# Patient Record
Sex: Female | Born: 1935
Health system: Southern US, Community
[De-identification: ages and names within clinical notes are randomized; demographics above are authoritative.]

## PROBLEM LIST (undated history)

## (undated) DIAGNOSIS — I00 Rheumatic fever without heart involvement: Secondary | ICD-10-CM

## (undated) DIAGNOSIS — I484 Atypical atrial flutter: Secondary | ICD-10-CM

## (undated) DIAGNOSIS — I4819 Other persistent atrial fibrillation: Secondary | ICD-10-CM

## (undated) DIAGNOSIS — K219 Gastro-esophageal reflux disease without esophagitis: Secondary | ICD-10-CM

## (undated) DIAGNOSIS — T8859XA Other complications of anesthesia, initial encounter: Secondary | ICD-10-CM

## (undated) DIAGNOSIS — E871 Hypo-osmolality and hyponatremia: Secondary | ICD-10-CM

## (undated) DIAGNOSIS — C50919 Malignant neoplasm of unspecified site of unspecified female breast: Secondary | ICD-10-CM

## (undated) DIAGNOSIS — K449 Diaphragmatic hernia without obstruction or gangrene: Secondary | ICD-10-CM

## (undated) DIAGNOSIS — H409 Unspecified glaucoma: Secondary | ICD-10-CM

## (undated) DIAGNOSIS — Z9889 Other specified postprocedural states: Secondary | ICD-10-CM

## (undated) DIAGNOSIS — D696 Thrombocytopenia, unspecified: Secondary | ICD-10-CM

## (undated) DIAGNOSIS — T4145XA Adverse effect of unspecified anesthetic, initial encounter: Secondary | ICD-10-CM

## (undated) DIAGNOSIS — M1990 Unspecified osteoarthritis, unspecified site: Secondary | ICD-10-CM

## (undated) DIAGNOSIS — I5032 Chronic diastolic (congestive) heart failure: Secondary | ICD-10-CM

## (undated) DIAGNOSIS — Z923 Personal history of irradiation: Secondary | ICD-10-CM

## (undated) DIAGNOSIS — D649 Anemia, unspecified: Secondary | ICD-10-CM

## (undated) DIAGNOSIS — F419 Anxiety disorder, unspecified: Secondary | ICD-10-CM

## (undated) DIAGNOSIS — C801 Malignant (primary) neoplasm, unspecified: Secondary | ICD-10-CM

## (undated) HISTORY — DX: Other specified postprocedural states: Z98.890

## (undated) HISTORY — PX: APPENDECTOMY: SHX54

## (undated) HISTORY — DX: Atypical atrial flutter: I48.4

## (undated) HISTORY — PX: KIDNEY CYST REMOVAL: SHX684

## (undated) HISTORY — PX: BACK SURGERY: SHX140

## (undated) HISTORY — DX: Rheumatic fever without heart involvement: I00

## (undated) HISTORY — PX: ABDOMINAL HYSTERECTOMY: SHX81

## (undated) HISTORY — PX: TONSILLECTOMY: SUR1361

## (undated) HISTORY — PX: BREAST LUMPECTOMY: SHX2

## (undated) HISTORY — PX: CATARACT EXTRACTION: SUR2

## (undated) HISTORY — DX: Other persistent atrial fibrillation: I48.19

## (undated) HISTORY — DX: Chronic diastolic (congestive) heart failure: I50.32

## (undated) HISTORY — DX: Thrombocytopenia, unspecified: D69.6

## (undated) HISTORY — PX: BREAST BIOPSY: SHX20

## (undated) HISTORY — PX: CARDIAC CATHETERIZATION: SHX172

## (undated) HISTORY — PX: EYE SURGERY: SHX253

## (undated) HISTORY — DX: Hypo-osmolality and hyponatremia: E87.1

## (undated) HISTORY — PX: BREAST SURGERY: SHX581

## (undated) SURGERY — Surgical Case
Anesthesia: *Unknown

---

## 1998-07-22 ENCOUNTER — Other Ambulatory Visit: Admission: RE | Admit: 1998-07-22 | Discharge: 1998-07-22 | Payer: Self-pay | Admitting: Internal Medicine

## 1998-11-03 ENCOUNTER — Ambulatory Visit (HOSPITAL_BASED_OUTPATIENT_CLINIC_OR_DEPARTMENT_OTHER): Admission: RE | Admit: 1998-11-03 | Discharge: 1998-11-03 | Payer: Self-pay | Admitting: Orthopedic Surgery

## 1999-02-09 ENCOUNTER — Encounter: Payer: Self-pay | Admitting: Emergency Medicine

## 1999-02-09 ENCOUNTER — Emergency Department (HOSPITAL_COMMUNITY): Admission: EM | Admit: 1999-02-09 | Discharge: 1999-02-09 | Payer: Self-pay | Admitting: Emergency Medicine

## 1999-03-20 ENCOUNTER — Emergency Department (HOSPITAL_COMMUNITY): Admission: EM | Admit: 1999-03-20 | Discharge: 1999-03-20 | Payer: Self-pay | Admitting: Emergency Medicine

## 1999-03-20 ENCOUNTER — Encounter: Payer: Self-pay | Admitting: Emergency Medicine

## 2000-01-27 ENCOUNTER — Emergency Department (HOSPITAL_COMMUNITY): Admission: EM | Admit: 2000-01-27 | Discharge: 2000-01-27 | Payer: Self-pay | Admitting: Emergency Medicine

## 2000-01-27 ENCOUNTER — Encounter: Payer: Self-pay | Admitting: Emergency Medicine

## 2000-03-06 ENCOUNTER — Other Ambulatory Visit: Admission: RE | Admit: 2000-03-06 | Discharge: 2000-03-06 | Payer: Self-pay | Admitting: *Deleted

## 2000-03-28 ENCOUNTER — Encounter: Payer: Self-pay | Admitting: Internal Medicine

## 2000-03-28 ENCOUNTER — Encounter: Admission: RE | Admit: 2000-03-28 | Discharge: 2000-03-28 | Payer: Self-pay | Admitting: Internal Medicine

## 2000-10-22 ENCOUNTER — Encounter: Payer: Self-pay | Admitting: Internal Medicine

## 2000-10-22 ENCOUNTER — Encounter: Admission: RE | Admit: 2000-10-22 | Discharge: 2000-10-22 | Payer: Self-pay | Admitting: Internal Medicine

## 2001-01-29 ENCOUNTER — Encounter: Admission: RE | Admit: 2001-01-29 | Discharge: 2001-01-29 | Payer: Self-pay | Admitting: Internal Medicine

## 2001-01-29 ENCOUNTER — Encounter: Payer: Self-pay | Admitting: Internal Medicine

## 2001-02-19 ENCOUNTER — Encounter: Admission: RE | Admit: 2001-02-19 | Discharge: 2001-02-19 | Payer: Self-pay | Admitting: Internal Medicine

## 2001-02-19 ENCOUNTER — Encounter: Payer: Self-pay | Admitting: Internal Medicine

## 2001-04-01 ENCOUNTER — Encounter: Admission: RE | Admit: 2001-04-01 | Discharge: 2001-04-01 | Payer: Self-pay | Admitting: Internal Medicine

## 2001-04-01 ENCOUNTER — Encounter: Payer: Self-pay | Admitting: Internal Medicine

## 2001-04-29 ENCOUNTER — Other Ambulatory Visit: Admission: RE | Admit: 2001-04-29 | Discharge: 2001-04-29 | Payer: Self-pay | Admitting: Obstetrics and Gynecology

## 2001-05-12 ENCOUNTER — Ambulatory Visit (HOSPITAL_COMMUNITY): Admission: RE | Admit: 2001-05-12 | Discharge: 2001-05-12 | Payer: Self-pay | Admitting: Obstetrics and Gynecology

## 2001-05-12 ENCOUNTER — Encounter: Payer: Self-pay | Admitting: Obstetrics and Gynecology

## 2001-08-19 ENCOUNTER — Encounter: Payer: Self-pay | Admitting: Obstetrics and Gynecology

## 2001-08-19 ENCOUNTER — Ambulatory Visit (HOSPITAL_COMMUNITY): Admission: RE | Admit: 2001-08-19 | Discharge: 2001-08-19 | Payer: Self-pay | Admitting: Obstetrics and Gynecology

## 2001-11-11 ENCOUNTER — Encounter: Payer: Self-pay | Admitting: Obstetrics and Gynecology

## 2001-11-11 ENCOUNTER — Ambulatory Visit (HOSPITAL_COMMUNITY): Admission: RE | Admit: 2001-11-11 | Discharge: 2001-11-11 | Payer: Self-pay | Admitting: Obstetrics and Gynecology

## 2002-04-03 ENCOUNTER — Encounter: Admission: RE | Admit: 2002-04-03 | Discharge: 2002-04-03 | Payer: Self-pay | Admitting: Internal Medicine

## 2002-04-03 ENCOUNTER — Encounter: Payer: Self-pay | Admitting: Internal Medicine

## 2002-04-30 ENCOUNTER — Other Ambulatory Visit: Admission: RE | Admit: 2002-04-30 | Discharge: 2002-04-30 | Payer: Self-pay | Admitting: Obstetrics and Gynecology

## 2002-06-08 ENCOUNTER — Encounter: Payer: Self-pay | Admitting: Obstetrics and Gynecology

## 2002-06-08 ENCOUNTER — Ambulatory Visit (HOSPITAL_COMMUNITY): Admission: RE | Admit: 2002-06-08 | Discharge: 2002-06-08 | Payer: Self-pay | Admitting: Obstetrics and Gynecology

## 2003-04-07 ENCOUNTER — Encounter: Payer: Self-pay | Admitting: Internal Medicine

## 2003-04-07 ENCOUNTER — Encounter: Admission: RE | Admit: 2003-04-07 | Discharge: 2003-04-07 | Payer: Self-pay | Admitting: Internal Medicine

## 2003-05-11 ENCOUNTER — Other Ambulatory Visit: Admission: RE | Admit: 2003-05-11 | Discharge: 2003-05-11 | Payer: Self-pay | Admitting: Obstetrics and Gynecology

## 2004-04-07 ENCOUNTER — Encounter: Admission: RE | Admit: 2004-04-07 | Discharge: 2004-04-07 | Payer: Self-pay | Admitting: Internal Medicine

## 2004-08-10 ENCOUNTER — Encounter: Admission: RE | Admit: 2004-08-10 | Discharge: 2004-08-10 | Payer: Self-pay | Admitting: Internal Medicine

## 2004-08-30 ENCOUNTER — Inpatient Hospital Stay (HOSPITAL_COMMUNITY): Admission: RE | Admit: 2004-08-30 | Discharge: 2004-09-01 | Payer: Self-pay | Admitting: Neurosurgery

## 2005-04-17 ENCOUNTER — Encounter: Admission: RE | Admit: 2005-04-17 | Discharge: 2005-04-17 | Payer: Self-pay | Admitting: Internal Medicine

## 2005-05-15 ENCOUNTER — Observation Stay (HOSPITAL_COMMUNITY): Admission: EM | Admit: 2005-05-15 | Discharge: 2005-05-16 | Payer: Self-pay | Admitting: Emergency Medicine

## 2005-06-14 ENCOUNTER — Other Ambulatory Visit: Admission: RE | Admit: 2005-06-14 | Discharge: 2005-06-14 | Payer: Self-pay | Admitting: Obstetrics and Gynecology

## 2005-07-20 ENCOUNTER — Ambulatory Visit (HOSPITAL_COMMUNITY): Admission: RE | Admit: 2005-07-20 | Discharge: 2005-07-20 | Payer: Self-pay | Admitting: Cardiology

## 2006-04-24 ENCOUNTER — Encounter: Admission: RE | Admit: 2006-04-24 | Discharge: 2006-04-24 | Payer: Self-pay | Admitting: Internal Medicine

## 2006-04-26 ENCOUNTER — Ambulatory Visit (HOSPITAL_COMMUNITY): Admission: RE | Admit: 2006-04-26 | Discharge: 2006-04-26 | Payer: Self-pay | Admitting: Neurosurgery

## 2006-05-07 ENCOUNTER — Encounter: Admission: RE | Admit: 2006-05-07 | Discharge: 2006-05-07 | Payer: Self-pay | Admitting: Internal Medicine

## 2006-09-21 ENCOUNTER — Emergency Department (HOSPITAL_COMMUNITY): Admission: EM | Admit: 2006-09-21 | Discharge: 2006-09-21 | Payer: Self-pay | Admitting: Emergency Medicine

## 2006-10-23 ENCOUNTER — Encounter: Admission: RE | Admit: 2006-10-23 | Discharge: 2006-10-23 | Payer: Self-pay | Admitting: Internal Medicine

## 2006-10-24 ENCOUNTER — Encounter: Admission: RE | Admit: 2006-10-24 | Discharge: 2006-10-24 | Payer: Self-pay | Admitting: Internal Medicine

## 2006-11-18 ENCOUNTER — Ambulatory Visit (HOSPITAL_COMMUNITY): Admission: RE | Admit: 2006-11-18 | Discharge: 2006-11-18 | Payer: Self-pay | Admitting: Urology

## 2006-12-31 ENCOUNTER — Encounter: Admission: RE | Admit: 2006-12-31 | Discharge: 2006-12-31 | Payer: Self-pay | Admitting: Urology

## 2007-03-13 ENCOUNTER — Ambulatory Visit (HOSPITAL_COMMUNITY): Admission: RE | Admit: 2007-03-13 | Discharge: 2007-03-14 | Payer: Self-pay | Admitting: Interventional Radiology

## 2007-03-13 ENCOUNTER — Encounter (INDEPENDENT_AMBULATORY_CARE_PROVIDER_SITE_OTHER): Payer: Self-pay | Admitting: Interventional Radiology

## 2007-03-17 ENCOUNTER — Encounter: Admission: RE | Admit: 2007-03-17 | Discharge: 2007-03-17 | Payer: Self-pay | Admitting: Interventional Radiology

## 2007-04-16 ENCOUNTER — Encounter: Admission: RE | Admit: 2007-04-16 | Discharge: 2007-04-16 | Payer: Self-pay | Admitting: Interventional Radiology

## 2007-04-28 ENCOUNTER — Encounter: Admission: RE | Admit: 2007-04-28 | Discharge: 2007-04-28 | Payer: Self-pay | Admitting: Internal Medicine

## 2007-07-08 ENCOUNTER — Other Ambulatory Visit: Admission: RE | Admit: 2007-07-08 | Discharge: 2007-07-08 | Payer: Self-pay | Admitting: Obstetrics and Gynecology

## 2007-10-01 ENCOUNTER — Encounter: Admission: RE | Admit: 2007-10-01 | Discharge: 2007-10-01 | Payer: Self-pay | Admitting: Interventional Radiology

## 2007-12-24 ENCOUNTER — Inpatient Hospital Stay (HOSPITAL_COMMUNITY): Admission: EM | Admit: 2007-12-24 | Discharge: 2007-12-25 | Payer: Self-pay | Admitting: Emergency Medicine

## 2008-04-07 ENCOUNTER — Encounter: Admission: RE | Admit: 2008-04-07 | Discharge: 2008-04-07 | Payer: Self-pay | Admitting: Interventional Radiology

## 2008-04-09 ENCOUNTER — Ambulatory Visit: Payer: Self-pay | Admitting: Internal Medicine

## 2008-04-09 ENCOUNTER — Emergency Department (HOSPITAL_COMMUNITY): Admission: EM | Admit: 2008-04-09 | Discharge: 2008-04-10 | Payer: Self-pay | Admitting: Emergency Medicine

## 2008-05-03 ENCOUNTER — Emergency Department (HOSPITAL_COMMUNITY): Admission: EM | Admit: 2008-05-03 | Discharge: 2008-05-03 | Payer: Self-pay | Admitting: Emergency Medicine

## 2008-05-04 ENCOUNTER — Ambulatory Visit: Payer: Self-pay | Admitting: Cardiovascular Disease

## 2008-05-05 ENCOUNTER — Inpatient Hospital Stay (HOSPITAL_COMMUNITY): Admission: EM | Admit: 2008-05-05 | Discharge: 2008-05-07 | Payer: Self-pay | Admitting: Emergency Medicine

## 2008-05-27 ENCOUNTER — Ambulatory Visit (HOSPITAL_COMMUNITY): Admission: RE | Admit: 2008-05-27 | Discharge: 2008-05-27 | Payer: Self-pay | Admitting: Interventional Cardiology

## 2008-06-16 ENCOUNTER — Encounter: Admission: RE | Admit: 2008-06-16 | Discharge: 2008-06-16 | Payer: Self-pay | Admitting: Internal Medicine

## 2008-09-10 ENCOUNTER — Ambulatory Visit (HOSPITAL_COMMUNITY): Admission: RE | Admit: 2008-09-10 | Discharge: 2008-09-10 | Payer: Self-pay | Admitting: Interventional Cardiology

## 2008-10-26 ENCOUNTER — Ambulatory Visit (HOSPITAL_COMMUNITY): Admission: RE | Admit: 2008-10-26 | Discharge: 2008-10-26 | Payer: Self-pay | Admitting: Interventional Cardiology

## 2008-10-26 ENCOUNTER — Encounter (INDEPENDENT_AMBULATORY_CARE_PROVIDER_SITE_OTHER): Payer: Self-pay | Admitting: Interventional Cardiology

## 2008-11-11 ENCOUNTER — Observation Stay (HOSPITAL_COMMUNITY): Admission: AD | Admit: 2008-11-11 | Discharge: 2008-11-12 | Payer: Self-pay | Admitting: Interventional Cardiology

## 2008-11-11 ENCOUNTER — Inpatient Hospital Stay (HOSPITAL_BASED_OUTPATIENT_CLINIC_OR_DEPARTMENT_OTHER): Admission: RE | Admit: 2008-11-11 | Discharge: 2008-11-11 | Payer: Self-pay | Admitting: Interventional Cardiology

## 2008-11-17 ENCOUNTER — Ambulatory Visit: Payer: Self-pay | Admitting: Thoracic Surgery (Cardiothoracic Vascular Surgery)

## 2008-11-22 ENCOUNTER — Encounter
Admission: RE | Admit: 2008-11-22 | Discharge: 2008-11-22 | Payer: Self-pay | Admitting: Thoracic Surgery (Cardiothoracic Vascular Surgery)

## 2008-11-29 ENCOUNTER — Ambulatory Visit: Payer: Self-pay | Admitting: Thoracic Surgery (Cardiothoracic Vascular Surgery)

## 2008-12-03 ENCOUNTER — Ambulatory Visit
Admission: RE | Admit: 2008-12-03 | Discharge: 2008-12-03 | Payer: Self-pay | Admitting: Thoracic Surgery (Cardiothoracic Vascular Surgery)

## 2008-12-03 ENCOUNTER — Encounter: Payer: Self-pay | Admitting: Thoracic Surgery (Cardiothoracic Vascular Surgery)

## 2008-12-03 ENCOUNTER — Ambulatory Visit: Payer: Self-pay | Admitting: *Deleted

## 2008-12-07 HISTORY — PX: MITRAL VALVE REPAIR: SHX2039

## 2008-12-14 ENCOUNTER — Ambulatory Visit: Payer: Self-pay | Admitting: Thoracic Surgery (Cardiothoracic Vascular Surgery)

## 2008-12-14 ENCOUNTER — Inpatient Hospital Stay (HOSPITAL_COMMUNITY)
Admission: RE | Admit: 2008-12-14 | Discharge: 2008-12-20 | Payer: Self-pay | Admitting: Thoracic Surgery (Cardiothoracic Vascular Surgery)

## 2008-12-31 ENCOUNTER — Encounter
Admission: RE | Admit: 2008-12-31 | Discharge: 2008-12-31 | Payer: Self-pay | Admitting: Thoracic Surgery (Cardiothoracic Vascular Surgery)

## 2008-12-31 ENCOUNTER — Ambulatory Visit: Payer: Self-pay | Admitting: Thoracic Surgery (Cardiothoracic Vascular Surgery)

## 2009-01-03 ENCOUNTER — Ambulatory Visit: Payer: Self-pay | Admitting: Internal Medicine

## 2009-01-03 ENCOUNTER — Inpatient Hospital Stay (HOSPITAL_COMMUNITY): Admission: EM | Admit: 2009-01-03 | Discharge: 2009-01-06 | Payer: Self-pay | Admitting: Emergency Medicine

## 2009-02-07 ENCOUNTER — Ambulatory Visit: Payer: Self-pay | Admitting: Thoracic Surgery (Cardiothoracic Vascular Surgery)

## 2009-02-07 ENCOUNTER — Encounter: Payer: Self-pay | Admitting: Internal Medicine

## 2009-02-24 ENCOUNTER — Encounter (HOSPITAL_COMMUNITY): Admission: RE | Admit: 2009-02-24 | Discharge: 2009-05-25 | Payer: Self-pay | Admitting: Interventional Cardiology

## 2009-04-26 ENCOUNTER — Encounter: Admission: RE | Admit: 2009-04-26 | Discharge: 2009-04-26 | Payer: Self-pay | Admitting: Interventional Radiology

## 2009-04-26 ENCOUNTER — Ambulatory Visit (HOSPITAL_COMMUNITY): Admission: RE | Admit: 2009-04-26 | Discharge: 2009-04-26 | Payer: Self-pay | Admitting: Interventional Radiology

## 2009-07-04 ENCOUNTER — Encounter: Payer: Self-pay | Admitting: Internal Medicine

## 2009-07-04 ENCOUNTER — Ambulatory Visit: Payer: Self-pay | Admitting: Thoracic Surgery (Cardiothoracic Vascular Surgery)

## 2009-08-04 ENCOUNTER — Encounter: Admission: RE | Admit: 2009-08-04 | Discharge: 2009-08-04 | Payer: Self-pay | Admitting: Internal Medicine

## 2009-08-29 ENCOUNTER — Other Ambulatory Visit: Admission: RE | Admit: 2009-08-29 | Discharge: 2009-08-29 | Payer: Self-pay | Admitting: Obstetrics and Gynecology

## 2009-09-01 ENCOUNTER — Encounter: Admission: RE | Admit: 2009-09-01 | Discharge: 2009-09-01 | Payer: Self-pay | Admitting: Obstetrics and Gynecology

## 2009-12-12 ENCOUNTER — Encounter: Payer: Self-pay | Admitting: Internal Medicine

## 2009-12-12 ENCOUNTER — Ambulatory Visit: Payer: Self-pay | Admitting: Thoracic Surgery (Cardiothoracic Vascular Surgery)

## 2010-01-19 ENCOUNTER — Ambulatory Visit (HOSPITAL_COMMUNITY): Admission: RE | Admit: 2010-01-19 | Discharge: 2010-01-19 | Payer: Self-pay | Admitting: Gastroenterology

## 2010-05-08 ENCOUNTER — Ambulatory Visit (HOSPITAL_COMMUNITY): Admission: RE | Admit: 2010-05-08 | Discharge: 2010-05-08 | Payer: Self-pay | Admitting: Interventional Radiology

## 2010-05-17 ENCOUNTER — Encounter: Admission: RE | Admit: 2010-05-17 | Discharge: 2010-05-17 | Payer: Self-pay | Admitting: Interventional Radiology

## 2010-07-29 ENCOUNTER — Encounter: Payer: Self-pay | Admitting: Internal Medicine

## 2010-07-30 ENCOUNTER — Encounter: Payer: Self-pay | Admitting: Interventional Radiology

## 2010-07-30 ENCOUNTER — Encounter: Payer: Self-pay | Admitting: Internal Medicine

## 2010-07-31 ENCOUNTER — Encounter: Payer: Self-pay | Admitting: Thoracic Surgery (Cardiothoracic Vascular Surgery)

## 2010-08-07 ENCOUNTER — Encounter
Admission: RE | Admit: 2010-08-07 | Discharge: 2010-08-07 | Payer: Self-pay | Source: Home / Self Care | Attending: Internal Medicine | Admitting: Internal Medicine

## 2010-08-08 NOTE — Letter (Signed)
Summary: [facility]  [facility]   Imported By: Roderic Ovens 02/21/2009 11:28:50  _____________________________________________________________________  External Attachment:    Type:   Image     Comment:   External Document

## 2010-08-08 NOTE — Letter (Signed)
Summary: [facility]  [facility]   Imported By: Roderic Ovens 07/07/2009 16:17:43  _____________________________________________________________________  External Attachment:    Type:   Image     Comment:   External Document

## 2010-08-08 NOTE — Letter (Signed)
Summary: [facility]  [facility]   Imported By: Debby Freiberg 01/10/2010 13:32:29  _____________________________________________________________________  External Attachment:    Type:   Image     Comment:   External Document

## 2010-10-15 LAB — PROTIME-INR
INR: 2.2 — ABNORMAL HIGH (ref 0.00–1.49)
Prothrombin Time: 25.7 seconds — ABNORMAL HIGH (ref 11.6–15.2)

## 2010-10-16 LAB — TYPE AND SCREEN
ABO/RH(D): A POS
Antibody Screen: NEGATIVE

## 2010-10-16 LAB — POCT I-STAT 3, ART BLOOD GAS (G3+)
Acid-Base Excess: 1 mmol/L (ref 0.0–2.0)
Acid-Base Excess: 3 mmol/L — ABNORMAL HIGH (ref 0.0–2.0)
Bicarbonate: 25 mEq/L — ABNORMAL HIGH (ref 20.0–24.0)
Bicarbonate: 26.1 mEq/L — ABNORMAL HIGH (ref 20.0–24.0)
O2 Saturation: 94 %
O2 Saturation: 98 %
Patient temperature: 37
Patient temperature: 37.2
TCO2: 26 mmol/L (ref 0–100)
TCO2: 27 mmol/L (ref 0–100)
pCO2 arterial: 32.2 mmHg — ABNORMAL LOW (ref 35.0–45.0)
pCO2 arterial: 35.9 mmHg (ref 35.0–45.0)
pH, Arterial: 7.453 — ABNORMAL HIGH (ref 7.350–7.400)
pH, Arterial: 7.517 — ABNORMAL HIGH (ref 7.350–7.400)
pO2, Arterial: 63 mmHg — ABNORMAL LOW (ref 80.0–100.0)
pO2, Arterial: 98 mmHg (ref 80.0–100.0)

## 2010-10-16 LAB — CBC
HCT: 34.2 % — ABNORMAL LOW (ref 36.0–46.0)
HCT: 28.6 % — ABNORMAL LOW (ref 36.0–46.0)
HCT: 29.1 % — ABNORMAL LOW (ref 36.0–46.0)
HCT: 31.6 % — ABNORMAL LOW (ref 36.0–46.0)
HCT: 31.8 % — ABNORMAL LOW (ref 36.0–46.0)
HCT: 32.9 % — ABNORMAL LOW (ref 36.0–46.0)
HCT: 33 % — ABNORMAL LOW (ref 36.0–46.0)
HCT: 36.2 % (ref 36.0–46.0)
Hemoglobin: 11.7 g/dL — ABNORMAL LOW (ref 12.0–15.0)
Hemoglobin: 10.1 g/dL — ABNORMAL LOW (ref 12.0–15.0)
Hemoglobin: 10.2 g/dL — ABNORMAL LOW (ref 12.0–15.0)
Hemoglobin: 10.9 g/dL — ABNORMAL LOW (ref 12.0–15.0)
Hemoglobin: 11 g/dL — ABNORMAL LOW (ref 12.0–15.0)
Hemoglobin: 11.3 g/dL — ABNORMAL LOW (ref 12.0–15.0)
Hemoglobin: 11.5 g/dL — ABNORMAL LOW (ref 12.0–15.0)
Hemoglobin: 12.6 g/dL (ref 12.0–15.0)
MCHC: 34.3 g/dL (ref 30.0–36.0)
MCHC: 34.3 g/dL (ref 30.0–36.0)
MCHC: 34.5 g/dL (ref 30.0–36.0)
MCHC: 34.7 g/dL (ref 30.0–36.0)
MCHC: 34.7 g/dL (ref 30.0–36.0)
MCHC: 35 g/dL (ref 30.0–36.0)
MCHC: 35.1 g/dL (ref 30.0–36.0)
MCHC: 35.4 g/dL (ref 30.0–36.0)
MCV: 105 fL — ABNORMAL HIGH (ref 78.0–100.0)
MCV: 100.2 fL — ABNORMAL HIGH (ref 78.0–100.0)
MCV: 100.2 fL — ABNORMAL HIGH (ref 78.0–100.0)
MCV: 101.2 fL — ABNORMAL HIGH (ref 78.0–100.0)
MCV: 101.6 fL — ABNORMAL HIGH (ref 78.0–100.0)
MCV: 101.9 fL — ABNORMAL HIGH (ref 78.0–100.0)
MCV: 102.7 fL — ABNORMAL HIGH (ref 78.0–100.0)
MCV: 103.9 fL — ABNORMAL HIGH (ref 78.0–100.0)
Platelets: 242 10*3/uL (ref 150–400)
Platelets: 102 10*3/uL — ABNORMAL LOW (ref 150–400)
Platelets: 118 10*3/uL — ABNORMAL LOW (ref 150–400)
Platelets: 119 10*3/uL — ABNORMAL LOW (ref 150–400)
Platelets: 119 10*3/uL — ABNORMAL LOW (ref 150–400)
Platelets: 131 10*3/uL — ABNORMAL LOW (ref 150–400)
Platelets: 144 10*3/uL — ABNORMAL LOW (ref 150–400)
Platelets: 92 10*3/uL — ABNORMAL LOW (ref 150–400)
RBC: 3.26 MIL/uL — ABNORMAL LOW (ref 3.87–5.11)
RBC: 2.75 MIL/uL — ABNORMAL LOW (ref 3.87–5.11)
RBC: 2.85 MIL/uL — ABNORMAL LOW (ref 3.87–5.11)
RBC: 3.12 MIL/uL — ABNORMAL LOW (ref 3.87–5.11)
RBC: 3.16 MIL/uL — ABNORMAL LOW (ref 3.87–5.11)
RBC: 3.22 MIL/uL — ABNORMAL LOW (ref 3.87–5.11)
RBC: 3.29 MIL/uL — ABNORMAL LOW (ref 3.87–5.11)
RBC: 3.57 MIL/uL — ABNORMAL LOW (ref 3.87–5.11)
RDW: 17.1 % — ABNORMAL HIGH (ref 11.5–15.5)
RDW: 13.6 % (ref 11.5–15.5)
RDW: 14.1 % (ref 11.5–15.5)
RDW: 14.8 % (ref 11.5–15.5)
RDW: 14.9 % (ref 11.5–15.5)
RDW: 14.9 % (ref 11.5–15.5)
RDW: 14.9 % (ref 11.5–15.5)
RDW: 14.9 % (ref 11.5–15.5)
WBC: 5.4 10*3/uL (ref 4.0–10.5)
WBC: 11 10*3/uL — ABNORMAL HIGH (ref 4.0–10.5)
WBC: 11.2 10*3/uL — ABNORMAL HIGH (ref 4.0–10.5)
WBC: 15 10*3/uL — ABNORMAL HIGH (ref 4.0–10.5)
WBC: 15.1 10*3/uL — ABNORMAL HIGH (ref 4.0–10.5)
WBC: 15.7 10*3/uL — ABNORMAL HIGH (ref 4.0–10.5)
WBC: 6.8 10*3/uL (ref 4.0–10.5)
WBC: 9.6 10*3/uL (ref 4.0–10.5)

## 2010-10-16 LAB — BLOOD GAS, ARTERIAL
Acid-Base Excess: 4.8 mmol/L — ABNORMAL HIGH (ref 0.0–2.0)
Bicarbonate: 28.7 mEq/L — ABNORMAL HIGH (ref 20.0–24.0)
Drawn by: 181601
O2 Content: 21 L/min
O2 Saturation: 97.6 %
Patient temperature: 98.6
TCO2: 30 mmol/L (ref 0–100)
pCO2 arterial: 41.7 mmHg (ref 35.0–45.0)
pH, Arterial: 7.452 — ABNORMAL HIGH (ref 7.350–7.400)
pO2, Arterial: 83.1 mmHg (ref 80.0–100.0)

## 2010-10-16 LAB — DIFFERENTIAL
Basophils Absolute: 0 10*3/uL (ref 0.0–0.1)
Basophils Relative: 0 % (ref 0–1)
Eosinophils Absolute: 0.1 10*3/uL (ref 0.0–0.7)
Eosinophils Relative: 2 % (ref 0–5)
Lymphocytes Relative: 13 % (ref 12–46)
Lymphs Abs: 0.7 10*3/uL (ref 0.7–4.0)
Monocytes Absolute: 0.5 10*3/uL (ref 0.1–1.0)
Monocytes Relative: 10 % (ref 3–12)
Neutro Abs: 4 10*3/uL (ref 1.7–7.7)
Neutrophils Relative %: 75 % (ref 43–77)

## 2010-10-16 LAB — BASIC METABOLIC PANEL
BUN: 11 mg/dL (ref 6–23)
BUN: 10 mg/dL (ref 6–23)
BUN: 10 mg/dL (ref 6–23)
BUN: 13 mg/dL (ref 6–23)
BUN: 7 mg/dL (ref 6–23)
BUN: 7 mg/dL (ref 6–23)
CO2: 27 mEq/L (ref 19–32)
CO2: 26 mEq/L (ref 19–32)
CO2: 30 mEq/L (ref 19–32)
CO2: 31 mEq/L (ref 19–32)
CO2: 31 mEq/L (ref 19–32)
CO2: 31 mEq/L (ref 19–32)
Calcium: 9.4 mg/dL (ref 8.4–10.5)
Calcium: 7.9 mg/dL — ABNORMAL LOW (ref 8.4–10.5)
Calcium: 8.2 mg/dL — ABNORMAL LOW (ref 8.4–10.5)
Calcium: 8.6 mg/dL (ref 8.4–10.5)
Calcium: 8.8 mg/dL (ref 8.4–10.5)
Calcium: 8.8 mg/dL (ref 8.4–10.5)
Chloride: 101 mEq/L (ref 96–112)
Chloride: 108 mEq/L (ref 96–112)
Chloride: 95 mEq/L — ABNORMAL LOW (ref 96–112)
Chloride: 98 mEq/L (ref 96–112)
Chloride: 99 mEq/L (ref 96–112)
Chloride: 99 mEq/L (ref 96–112)
Creatinine, Ser: 0.53 mg/dL (ref 0.4–1.2)
Creatinine, Ser: 0.48 mg/dL (ref 0.4–1.2)
Creatinine, Ser: 0.59 mg/dL (ref 0.4–1.2)
Creatinine, Ser: 0.6 mg/dL (ref 0.4–1.2)
Creatinine, Ser: 0.6 mg/dL (ref 0.4–1.2)
Creatinine, Ser: 0.62 mg/dL (ref 0.4–1.2)
GFR calc Af Amer: >60 mL/min (ref >60)
GFR calc Af Amer: >60 mL/min (ref >60)
GFR calc Af Amer: >60 mL/min (ref >60)
GFR calc Af Amer: >60 mL/min (ref >60)
GFR calc Af Amer: >60 mL/min (ref >60)
GFR calc Af Amer: >60 mL/min (ref >60)
GFR calc non Af Amer: >60 mL/min (ref >60)
GFR calc non Af Amer: >60 mL/min (ref >60)
GFR calc non Af Amer: >60 mL/min (ref >60)
GFR calc non Af Amer: >60 mL/min (ref >60)
GFR calc non Af Amer: >60 mL/min (ref >60)
GFR calc non Af Amer: >60 mL/min (ref >60)
Glucose, Bld: 96 mg/dL (ref 70–99)
Glucose, Bld: 111 mg/dL — ABNORMAL HIGH (ref 70–99)
Glucose, Bld: 119 mg/dL — ABNORMAL HIGH (ref 70–99)
Glucose, Bld: 159 mg/dL — ABNORMAL HIGH (ref 70–99)
Glucose, Bld: 169 mg/dL — ABNORMAL HIGH (ref 70–99)
Glucose, Bld: 94 mg/dL (ref 70–99)
Potassium: 4.9 mEq/L (ref 3.5–5.1)
Potassium: 3.2 mEq/L — ABNORMAL LOW (ref 3.5–5.1)
Potassium: 3.5 mEq/L (ref 3.5–5.1)
Potassium: 3.5 mEq/L (ref 3.5–5.1)
Potassium: 3.8 mEq/L (ref 3.5–5.1)
Potassium: 4.2 mEq/L (ref 3.5–5.1)
Sodium: 133 mEq/L — ABNORMAL LOW (ref 135–145)
Sodium: 135 mEq/L (ref 135–145)
Sodium: 135 mEq/L (ref 135–145)
Sodium: 137 mEq/L (ref 135–145)
Sodium: 138 mEq/L (ref 135–145)
Sodium: 141 mEq/L (ref 135–145)

## 2010-10-16 LAB — HEMOGLOBIN AND HEMATOCRIT, BLOOD
HCT: 24.5 % — ABNORMAL LOW (ref 36.0–46.0)
Hemoglobin: 8.4 g/dL — ABNORMAL LOW (ref 12.0–15.0)

## 2010-10-16 LAB — POCT I-STAT, CHEM 8
BUN: 6 mg/dL (ref 6–23)
BUN: 7 mg/dL (ref 6–23)
Calcium, Ion: 1.07 mmol/L — ABNORMAL LOW (ref 1.12–1.32)
Calcium, Ion: 1.13 mmol/L (ref 1.12–1.32)
Chloride: 105 mEq/L (ref 96–112)
Chloride: 98 mEq/L (ref 96–112)
Creatinine, Ser: 0.5 mg/dL (ref 0.4–1.2)
Creatinine, Ser: 0.6 mg/dL (ref 0.4–1.2)
Glucose, Bld: 150 mg/dL — ABNORMAL HIGH (ref 70–99)
Glucose, Bld: 185 mg/dL — ABNORMAL HIGH (ref 70–99)
HCT: 32 % — ABNORMAL LOW (ref 36.0–46.0)
HCT: 35 % — ABNORMAL LOW (ref 36.0–46.0)
Hemoglobin: 10.9 g/dL — ABNORMAL LOW (ref 12.0–15.0)
Hemoglobin: 11.9 g/dL — ABNORMAL LOW (ref 12.0–15.0)
Potassium: 3.6 mEq/L (ref 3.5–5.1)
Potassium: 3.8 mEq/L (ref 3.5–5.1)
Sodium: 137 mEq/L (ref 135–145)
Sodium: 141 mEq/L (ref 135–145)
TCO2: 25 mmol/L (ref 0–100)
TCO2: 27 mmol/L (ref 0–100)

## 2010-10-16 LAB — PROTIME-INR
INR: 3 — ABNORMAL HIGH (ref 0.00–1.49)
INR: 3.1 — ABNORMAL HIGH (ref 0.00–1.49)
INR: 3.4 — ABNORMAL HIGH (ref 0.00–1.49)
INR: 1 (ref 0.00–1.49)
INR: 1.1 (ref 0.00–1.49)
INR: 1.1 (ref 0.00–1.49)
INR: 1.2 (ref 0.00–1.49)
INR: 1.2 (ref 0.00–1.49)
INR: 1.3 (ref 0.00–1.49)
Prothrombin Time: 33.6 seconds — ABNORMAL HIGH (ref 11.6–15.2)
Prothrombin Time: 34.4 seconds — ABNORMAL HIGH (ref 11.6–15.2)
Prothrombin Time: 37.1 seconds — ABNORMAL HIGH (ref 11.6–15.2)
Prothrombin Time: 13.1 seconds (ref 11.6–15.2)
Prothrombin Time: 14.3 seconds (ref 11.6–15.2)
Prothrombin Time: 14.4 seconds (ref 11.6–15.2)
Prothrombin Time: 15.4 seconds — ABNORMAL HIGH (ref 11.6–15.2)
Prothrombin Time: 15.7 seconds — ABNORMAL HIGH (ref 11.6–15.2)
Prothrombin Time: 17 seconds — ABNORMAL HIGH (ref 11.6–15.2)

## 2010-10-16 LAB — COMPREHENSIVE METABOLIC PANEL
ALT: 17 U/L (ref 0–35)
AST: 11 U/L (ref 0–37)
Albumin: 3.5 g/dL (ref 3.5–5.2)
Alkaline Phosphatase: 50 U/L (ref 39–117)
BUN: 12 mg/dL (ref 6–23)
CO2: 26 mEq/L (ref 19–32)
Calcium: 9 mg/dL (ref 8.4–10.5)
Chloride: 103 mEq/L (ref 96–112)
Creatinine, Ser: 0.45 mg/dL (ref 0.4–1.2)
GFR calc Af Amer: >60 mL/min (ref >60)
GFR calc non Af Amer: >60 mL/min (ref >60)
Glucose, Bld: 101 mg/dL — ABNORMAL HIGH (ref 70–99)
Potassium: 3.4 mEq/L — ABNORMAL LOW (ref 3.5–5.1)
Sodium: 140 mEq/L (ref 135–145)
Total Bilirubin: 0.7 mg/dL (ref 0.3–1.2)
Total Protein: 6.9 g/dL (ref 6.0–8.3)

## 2010-10-16 LAB — URINALYSIS, ROUTINE W REFLEX MICROSCOPIC
Bilirubin Urine: NEGATIVE
Glucose, UA: NEGATIVE mg/dL
Hgb urine dipstick: NEGATIVE
Ketones, ur: NEGATIVE mg/dL
Nitrite: NEGATIVE
Protein, ur: NEGATIVE mg/dL
Specific Gravity, Urine: 1.015 (ref 1.005–1.030)
Urobilinogen, UA: 1 mg/dL (ref 0.0–1.0)
pH: 6.5 (ref 5.0–8.0)

## 2010-10-16 LAB — CARDIAC PANEL(CRET KIN+CKTOT+MB+TROPI)
CK, MB: 2.7 ng/mL (ref 0.3–4.0)
CK, MB: 2.8 ng/mL (ref 0.3–4.0)
Relative Index: INVALID (ref 0.0–2.5)
Relative Index: INVALID (ref 0.0–2.5)
Total CK: 34 U/L (ref 7–177)
Total CK: 36 U/L (ref 7–177)
Troponin I: 0.03 ng/mL (ref 0.00–0.06)
Troponin I: 0.03 ng/mL (ref 0.00–0.06)

## 2010-10-16 LAB — GLUCOSE, CAPILLARY
Glucose-Capillary: 125 mg/dL — ABNORMAL HIGH (ref 70–99)
Glucose-Capillary: 129 mg/dL — ABNORMAL HIGH (ref 70–99)
Glucose-Capillary: 135 mg/dL — ABNORMAL HIGH (ref 70–99)
Glucose-Capillary: 151 mg/dL — ABNORMAL HIGH (ref 70–99)
Glucose-Capillary: 165 mg/dL — ABNORMAL HIGH (ref 70–99)
Glucose-Capillary: 176 mg/dL — ABNORMAL HIGH (ref 70–99)
Glucose-Capillary: 79 mg/dL (ref 70–99)

## 2010-10-16 LAB — CREATININE, SERUM
Creatinine, Ser: 0.48 mg/dL (ref 0.4–1.2)
Creatinine, Ser: 0.58 mg/dL (ref 0.4–1.2)
GFR calc Af Amer: >60 mL/min (ref >60)
GFR calc Af Amer: >60 mL/min (ref >60)
GFR calc non Af Amer: >60 mL/min (ref >60)
GFR calc non Af Amer: >60 mL/min (ref >60)

## 2010-10-16 LAB — POCT I-STAT 4, (NA,K, GLUC, HGB,HCT)
Glucose, Bld: 87 mg/dL (ref 70–99)
HCT: 30 % — ABNORMAL LOW (ref 36.0–46.0)
Hemoglobin: 10.2 g/dL — ABNORMAL LOW (ref 12.0–15.0)
Potassium: 2.7 mEq/L — CL (ref 3.5–5.1)
Sodium: 145 mEq/L (ref 135–145)

## 2010-10-16 LAB — POCT CARDIAC MARKERS
CKMB, poc: 2.1 ng/mL (ref 1.0–8.0)
Myoglobin, poc: 54.5 ng/mL (ref 12–200)
Troponin i, poc: <0.05 ng/mL (ref 0.00–0.09)

## 2010-10-16 LAB — PREPARE FRESH FROZEN PLASMA

## 2010-10-16 LAB — PREPARE PLATELETS

## 2010-10-16 LAB — APTT
aPTT: 27 seconds (ref 24–37)
aPTT: 36 seconds (ref 24–37)

## 2010-10-16 LAB — MAGNESIUM
Magnesium: 2.3 mg/dL (ref 1.5–2.5)
Magnesium: 2.1 mg/dL (ref 1.5–2.5)
Magnesium: 2.3 mg/dL (ref 1.5–2.5)
Magnesium: 2.7 mg/dL — ABNORMAL HIGH (ref 1.5–2.5)

## 2010-10-16 LAB — HEMOGLOBIN A1C
Hgb A1c MFr Bld: 5.2 % (ref 4.6–6.1)
Mean Plasma Glucose: 103 mg/dL

## 2010-10-16 LAB — CK TOTAL AND CKMB (NOT AT ARMC)
CK, MB: 2.9 ng/mL (ref 0.3–4.0)
Relative Index: INVALID (ref 0.0–2.5)
Total CK: 44 U/L (ref 7–177)

## 2010-10-16 LAB — TROPONIN I: Troponin I: 0.03 ng/mL (ref 0.00–0.06)

## 2010-10-16 LAB — TSH: TSH: 0.856 u[IU]/mL (ref 0.350–4.500)

## 2010-10-16 LAB — PLATELET COUNT: Platelets: 82 10*3/uL — ABNORMAL LOW (ref 150–400)

## 2010-10-17 LAB — BLOOD GAS, ARTERIAL
Acid-Base Excess: 5.7 mmol/L — ABNORMAL HIGH (ref 0.0–2.0)
Bicarbonate: 29.4 mEq/L — ABNORMAL HIGH (ref 20.0–24.0)
Drawn by: 181601
FIO2: 0.21 %
O2 Saturation: 98.5 %
Patient temperature: 98.6
TCO2: 30.6 mmol/L (ref 0–100)
pCO2 arterial: 40.1 mmHg (ref 35.0–45.0)
pH, Arterial: 7.478 — ABNORMAL HIGH (ref 7.350–7.400)
pO2, Arterial: 92.9 mmHg (ref 80.0–100.0)

## 2010-10-17 LAB — CBC
HCT: 38.9 % (ref 36.0–46.0)
Hemoglobin: 13.4 g/dL (ref 12.0–15.0)
MCHC: 34.5 g/dL (ref 30.0–36.0)
MCV: 101.6 fL — ABNORMAL HIGH (ref 78.0–100.0)
Platelets: 165 10*3/uL (ref 150–400)
RBC: 3.83 MIL/uL — ABNORMAL LOW (ref 3.87–5.11)
RDW: 13.7 % (ref 11.5–15.5)
WBC: 7.7 10*3/uL (ref 4.0–10.5)

## 2010-10-17 LAB — URINALYSIS, ROUTINE W REFLEX MICROSCOPIC
Bilirubin Urine: NEGATIVE
Glucose, UA: NEGATIVE mg/dL
Hgb urine dipstick: NEGATIVE
Ketones, ur: NEGATIVE mg/dL
Nitrite: NEGATIVE
Protein, ur: NEGATIVE mg/dL
Specific Gravity, Urine: 1.015 (ref 1.005–1.030)
Urobilinogen, UA: 1 mg/dL (ref 0.0–1.0)
pH: 6.5 (ref 5.0–8.0)

## 2010-10-17 LAB — COMPREHENSIVE METABOLIC PANEL
ALT: 18 U/L (ref 0–35)
AST: 20 U/L (ref 0–37)
Albumin: 3.8 g/dL (ref 3.5–5.2)
Alkaline Phosphatase: 56 U/L (ref 39–117)
BUN: 14 mg/dL (ref 6–23)
CO2: 26 mEq/L (ref 19–32)
Calcium: 9.2 mg/dL (ref 8.4–10.5)
Chloride: 101 mEq/L (ref 96–112)
Creatinine, Ser: 0.49 mg/dL (ref 0.4–1.2)
GFR calc Af Amer: >60 mL/min (ref >60)
GFR calc non Af Amer: >60 mL/min (ref >60)
Glucose, Bld: 107 mg/dL — ABNORMAL HIGH (ref 70–99)
Potassium: 4 mEq/L (ref 3.5–5.1)
Sodium: 136 mEq/L (ref 135–145)
Total Bilirubin: 1 mg/dL (ref 0.3–1.2)
Total Protein: 6.9 g/dL (ref 6.0–8.3)

## 2010-10-17 LAB — POCT I-STAT 3, VENOUS BLOOD GAS (G3P V)
Acid-Base Excess: 3 mmol/L — ABNORMAL HIGH (ref 0.0–2.0)
Bicarbonate: 27.8 mEq/L — ABNORMAL HIGH (ref 20.0–24.0)
O2 Saturation: 73 %
TCO2: 29 mmol/L (ref 0–100)
pCO2, Ven: 42.4 mmHg — ABNORMAL LOW (ref 45.0–50.0)
pH, Ven: 7.425 — ABNORMAL HIGH (ref 7.250–7.300)
pO2, Ven: 38 mmHg (ref 30.0–45.0)

## 2010-10-17 LAB — POCT I-STAT 3, ART BLOOD GAS (G3+)
Acid-Base Excess: 4 mmol/L — ABNORMAL HIGH (ref 0.0–2.0)
Bicarbonate: 28.2 mEq/L — ABNORMAL HIGH (ref 20.0–24.0)
O2 Saturation: 95 %
TCO2: 29 mmol/L (ref 0–100)
pCO2 arterial: 39.9 mmHg (ref 35.0–45.0)
pH, Arterial: 7.457 — ABNORMAL HIGH (ref 7.350–7.400)
pO2, Arterial: 72 mmHg — ABNORMAL LOW (ref 80.0–100.0)

## 2010-10-17 LAB — TYPE AND SCREEN
ABO/RH(D): A POS
Antibody Screen: NEGATIVE

## 2010-10-17 LAB — HEMOGLOBIN A1C
Hgb A1c MFr Bld: 5.1 % (ref 4.6–6.1)
Mean Plasma Glucose: 100 mg/dL

## 2010-10-17 LAB — APTT: aPTT: 27 seconds (ref 24–37)

## 2010-10-17 LAB — PROTIME-INR
INR: 1.1 (ref 0.00–1.49)
INR: 1.1 (ref 0.00–1.49)
INR: 1.1 (ref 0.00–1.49)
Prothrombin Time: 14.9 seconds (ref 11.6–15.2)
Prothrombin Time: 14 seconds (ref 11.6–15.2)
Prothrombin Time: 14.1 seconds (ref 11.6–15.2)

## 2010-10-17 LAB — ABO/RH: ABO/RH(D): A POS

## 2010-11-21 NOTE — Discharge Summary (Signed)
[name], [name]                 ACCOUNT NO.:  [id number]   MEDICAL RECORD NO.:  [id number]          PATIENT TYPE:  EMS   LOCATION:  ED                           FACILITY:  [facility]   PHYSICIAN:  Hal T. Stoneking, M.D. DATE OF BIRTH:  Aug 12, 1925   DATE OF ADMISSION:  04/09/2008  DATE OF DISCHARGE:  04/10/2008                               DISCHARGE SUMMARY   ADMITTING DIAGNOSIS:  Left arm and hand pain.   DISCHARGE DIAGNOSES:  1. Left arm and hand pain question, cardiac versus musculoskeletal.  2. Past history of mitral valve prolapse  3. History of supraventricular tachycardia.  4. Hypercholesterolemia   BRIEF HISTORY:  The patient is a very nice 77 year old white female who  is independent.  Stated that for the past week or so, she just was not  feeling well.  She states that she would wake up feeling kind of anxious  but no chest pain.  Then last evening at rest, she developed pain in her  left hand and her left arm.  She finally decided to come to the  emergency room to have this evaluated.  She states the pain gradually  resolved.  She was given nitroglycerin paste but this gave her a severe  headache.  It was discontinued but she had no return of the pain.   PHYSICAL EXAMINATION:  VITAL SIGNS:  Temperature at the time of  discharge 98, blood pressure 102/60, pulse rate 65, respiratory rate 16,  O2 sat 100%.  HEART:  Regular rate and rhythm with a 3/6 systolic murmur heard best at  the apex.  LUNGS:  Clear.   LABORATORY DATA:  White count 5200, hemoglobin 13, and platelet count  182,000.  Initial CK 60 with an MB of 1.6.  Initial troponin less than  0.05.  Repeat troponin after 8 hours 0.04, CK 66 with MB of 1.8.  Electrolytes, sodium 139, potassium 3.5, chloride 103, bicarb 27,  glucose 128, BUN 8, creatinine 0.53, albumin 3.9, SGOT 23, and SGPT 19.  Chest x-ray showed findings consistent with COPD.  CT angiogram done  because of an elevated D-dimer revealed no evidence  of pulmonary  embolus.  The D-dimer was slightly elevated at 0.51.   ASSESSMENT:  Left arm pain of questionable significance.  Her workup is  negative.  Her EKG was normal.   DISPOSITION:  The patient is discharged home on her routine medications,  which include atenolol and her Ativan p.r.n.  She will follow up in the  office in approximately 2-3 days to evaluate further, possibly set up to  the echocardiogram to reevaluate her heart murmur and to consider a  stress Cardiolite.           ______________________________  Sunday Spillers. Pete Glatter, M.D.     HTS/MEDQ  D:  04/10/2008  T:  04/10/2008  Job:  [id number]   cc:   Theressa Millard, M.D.  Fax: [phone]

## 2010-11-21 NOTE — H&P (Signed)
NAMEKELLER, MIKELS NO.:  [id number]   MEDICAL RECORD NO.:  [id number]          PATIENT TYPE:  INP   LOCATION:  1414                         FACILITY:  [facility]   PHYSICIAN:  Ramiro Harvest, MD    DATE OF BIRTH:  [birth year]   DATE OF ADMISSION:  12/24/2007  DATE OF DISCHARGE:                              HISTORY & PHYSICAL   PRIMARY CARE PHYSICIAN:  The patient's primary care physician is Theressa Millard, M.D. of [place].   GASTROENTEROLOGIST:  The patient's gastroenterologist is Tasia Catchings,  M.D. of [place].   HISTORY OF THE PRESENT ILLNESS:  This patient is a pleasant 77 year old  white female with a history of mitral valve prolapse, supraventricular  tachycardia, lumbar disease status post surgery, and right breast cancer  status post lumpectomy who presented to the ED with a several-hour  history of grabbing constant lower abdominal pain starting at 16:30  hours on the day of admission.  He had several episodes of loose stools  at home and in the ED. Her associated symptoms include nausea and  chills.  No nausea and chills.  The patient denies any fever; and, has  had no emesis, no chest pain, no shortness of breath, no palpitations no  dysuria, no hematuria, no melena, no hematochezia, no hematemesis, and  no focal neurological symptoms.  The patient states that 5 days prior to  admission he was treated with Macrobid for a urinary tract infection.  The patient also states that recently he was started on amoxicillin for  an oral infection sustained after oral surgery for cyst removal in his  salivary gland.  The patient has no other associated symptoms.   The patient came to the ED where labs obtained showed a white count of  11.0, H&H of 14.6 and 42.6 cm respectively.  CMET showed a sodium of  134, potassium 4.1, chloride 98, bicarb 24, BUN 17, and creatinine 0.62.  Bilirubin of 1.3.  UA was positive for nitrites and positive for  leukocytes.  Micro showed no white cells.  CT of the abdomen and pelvis  was significant for colitis of the descending colon.  In the ED stool  studies were obtained for C-diff as well as culture and ova and  parasites.  The patient was given oral Flagyl in the ED were called to  admit the patient for further evaluation and management.   ALLERGIES:  CIPRO CAUSES FLU-LIKE SYMPTOMS.  SULFA CAUSES FLU-LIKE  SYMPTOMS AS DOES VERSED.   PAST MEDICAL HISTORY:  1. Fatigue of unknown etiology.  2. History of supraventricular tachycardia.  3. Mitral valve prolapse.  4. History of right breast cancer status post lumpectomy in 1998.  5. Gastroesophageal reflux disease.  6. History of dizzy spells.  7. The patient has syncope, etiology unknown.  8. 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  9. Left L4-L5 lateral recessed stenosis.  10.Lumbar spondylosis.  11.Lumbar radiculopathy.  12.Status post left L4-5 lumbar laminotomy and resection of synovial      cysts with microdissection on August 30, 2004 per Dr. Newell Coral.  13.Status post hysterectomy.  14.Status post appendectomy.  15.Questionable history of hyperlipidemia.  The patient denies having      hyperlipidemia.   MEDICATIONS:  Home medications include:  1. Amoxicillin 875 mg twice a day.  2. Lorazepam 0.5 mg by mouth at bedtime.  3. Atenolol 25 mg daily.  4. Multivitamin daily and several other vitamins daily.   SOCIAL HISTORY:  The patient has been widowed x5 years.  She lives alone  in [place].  No tobacco. No alcohol.  No IV drug use.  The patient  has three sons all of whom are healthy.   FAMILY HISTORY:  The family history is noncontributory.   REVIEW OF SYSTEMS:  The review of systems is per the  HPI; otherwise,  negative.   PHYSICAL EXAMINATION:  VITAL SIGNS:  On physical exam temperature is  97.0, blood pressure 159/79, pulse  64, respiratory rate 27, and she is  satting 99%on room air.  GENERAL  APPEARANCE:  In general the patient is in no apparent distress.  HEENT:  Normocephalic and atraumatic.  Pupils are equal, round and react  to light.  Extraocular movements are intact.  Oropharynx is clear with  no lesion and no exudates.  NECK:  The neck is supple.  No lymphadenopathy.  LUNGS:  Respiratory -  the lungs are clear to auscultation bilaterally.  No wheezes, no rhonchi and no crackles.  HEART:  Cardiovascular shows regular rate and rhythm with a 3/6 systolic  ejection murmur.  ABDOMEN:  The abdomen is soft, nontender and nondistended.  Positive  bowel sounds.  She has a generalized soreness per the patient.  No  rebound and no guarding.  EXTREMITIES:  The extremities reveal no clubbing, cyanosis or edema.  NEUROLOGICAL:  The patient is alert and oriented x3.  Cranial nerves II-  XII are grossly intact.  No focal deficits.  Sensation is intact.  Gait  not tested.   LABORATORY DATA:  CBC; white count 11.0, hemoglobin 14.6, platelets  169,000, and hematocrit 42.6 with an ANC of 9.5. Lipase 25.  PTT 26,  PT  12.4, INR 0.9.  Sodium 134, potassium 4.1, chloride 98, bicarb 24, BUN  17, creatinine 0.62, and glucose 129.  Bilirubin 1.3, alk phosphatase  65, AST 30, ALT 21, protein 7.7, calcium 9.8, albumin 4.20.  UA; amber,  clear, specific gravity 1.010, pH of 6.5, glucose negative, bilirubin  negative, ketones trace, blood negative, protein negative urobilinogen  1.0, 0nitrite positive, leukocytes trace.  Micro; WBCs 0-2 and bacteria  rare.  Acute abdominal series showed a nonspecific bowel gas pattern  without evidence of obstruction,  there may be slight thickening of the  small-bowel folds, and may be secondary to underdistention.  CT of the  abdomen and pelvis showed a patulous GE junction suggestive of chronic  reflux, left inferior pole renal scar status post radiofrequency  ablation no acute abdominal abnormalities, right atrial enlargement, and  colitis of the descending  colon.  The differential includes infection of  C diff colitis, inflammatory bowel disease or ischemia, hysterectomy and  postsurgical changes in the anterior abdominal wall, and moderate fecal  burden.   ASSESSMENT AND PLAN:  This patient is an 77 year old white female with a  history of supraventricular tachycardia, mitral valve prolapse,  gastroesophageal reflux disease, history of right breast cancer status  post lumpectomy, and lumbar disease status post surgery who presented to  the emergency department  with a several-hour history of grabbing  abdominal cramping, lower abdominal  pain and diarrhea.   Problem  1  Colitis. The differential includes an infectious etiology in  the setting of recent antibiotic use versus ischemic colitis versus  inflammatory colitis.  We will hold her amoxicillin/antibiotics for now.  Check blood cultures x2.  Check stool for Clostridium difficile, ova and  parasites, enteric pathogens, fecal leukocytes, and Escherichia coli. We  will place her empirically on intravenous vancomycin and Flagyl, and  hydrate with intravenous fluids. We will need a GI consult in the  morning for possible flexible sigmoidoscopy versus colonoscopy for  further evaluation and management.  Problem  2  History of supraventricular tachycardia.  We will continue  her home dose of atenolol.  Problem  3  Mitral valve prolapse.  Problem  4  Gastroesophageal reflux disease.  Protonix.  Problem  5  History of right breast cancer status post lumpectomy.  Problem  6  History of near syncope and dizzy spells.  Problem  7  Lumbar spondylosis and radiculopathy with lumbar 4-5  synovial cysts status post left L4-5 lumbar laminectomy, and resection  of synovial cysts with microdissection on August 30, 2004.  Problem  8  Prophylaxis. Protonix for gastrointestinal prophylaxis.  SCDs for deep venous thrombosis prophylaxis.  It's been a pleasure taking care of Mrs Nachtigal.       Ramiro Harvest, MD  Electronically Signed     DT/MEDQ  D:  12/25/2007  T:  12/25/2007  Job:  [id number]   cc:   Tasia Catchings, M.D.  Fax: [phone]

## 2010-11-21 NOTE — Assessment & Plan Note (Signed)
OFFICE VISIT   LYVONNE, CASSELL  DOB:  25-Jan-1926                                        July 04, 2009  CHART #:  [id number]   HISTORY OF PRESENT ILLNESS:  Ms. Tercero presents to the office today for  routine followup now more than 6 months status post right miniature  thoracotomy for mitral valve repair with Cox-Maze procedure.  She was  last seen here in the office on February 07, 2009.  Since then she had done  remarkably well.  At some point in the fall, she spontaneously converted  to sinus rhythm.  She immediately felt different and noticed an  improvement in her exercise tolerance.  Since then she has had no  further episodes to her knowledge.  She completed the cardiac rehab  program and is now back enjoying herself with normal physical activity.  She feels quite well.  Her only complaint is that she has had a  persistent dry nonproductive cough ever since she started taking  lisinopril.  She continues to take lisinopril, however, and she has not  opted to try any new medication as an alternative.  She has no tachy  palpitations.  She has no shortness of breath either with activity or at  rest.  Overall, she feels quite well.  She remains on Coumadin, and she  has not had any problems with Coumadin therapy.   CURRENT MEDICATIONS:  Warfarin, atenolol, lisinopril, aspirin.   PHYSICAL EXAMINATION:  GENERAL:  Notable for well-appearing female with  blood pressure 158/92, pulse 74 and regular.  Two channel telemetry  rhythm strip demonstrates normal sinus rhythm.  CHEST:  Clear breath sounds which are symmetrical.  No wheezes or  rhonchi are noted.  CARDIOVASCULAR:  Regular rate and rhythm.  No murmurs, rubs, or gallops  are appreciated.  ABDOMEN:  Soft, nontender.  EXTREMITIES:  Warm and well perfused.  There is no lower extremity  edema.   IMPRESSION:  Ms. Wickens is doing remarkably well now more than 6 months  status post right miniature thoracotomy  for mitral valve repair and Maze  procedure.  She is currently maintaining sinus rhythm and overall she  feels quite well.   PLAN:  We will have Ms. Mauney return to see [place] in 6 months for routine  followup and rhythm check.  At some point within the next 6 months, it  would be reasonable to consider an outpatient CardioNet monitor or  a  Holter monitor to verify that she remains in sinus rhythm.  If she  continues to do well ultimately, it would be reasonable to consider  stopping Coumadin.    Salvatore Decent. Cornelius Moras, M.D.  Electronically Signed   CHO/MEDQ  D:  07/04/2009  T:  07/04/2009  Job:  [id number]   cc:   Lyn Records, M.D.  Hillis Range, MD  Theressa Millard, M.D.

## 2010-11-21 NOTE — Op Note (Signed)
[name], [name]                 ACCOUNT NO.:  [id number]   MEDICAL RECORD NO.:  [id number]          PATIENT TYPE:  INP   LOCATION:  2310                         FACILITY:  [facility]   PHYSICIAN:  Salvatore Decent. Cornelius Moras, M.D. DATE OF BIRTH:  05/05/1926   DATE OF PROCEDURE:  12/14/2008  DATE OF DISCHARGE:                               OPERATIVE REPORT   PREOPERATIVE DIAGNOSES:  1. Severe mitral regurgitation.  2. Persistent atrial fibrillation, status post direct current      cardioversion.   POSTOPERATIVE DIAGNOSES:  1. Severe mitral regurgitation.  2. Persistent atrial fibrillation, status post direct current      cardioversion.   PROCEDURE:  Right miniature anterolateral thoracotomy for mitral valve  repair (plication of posterior leaflet commissures x2, anterolateral  commissuroplasty x1, 34-mm Sorin MEMO 3-D ring annuloplasty) and Cox  CryoMaze procedure (left side lesion set).   SURGEON:  Salvatore Decent. Cornelius Moras, MD   ASSISTANT:  Doree Fudge, PA   ANESTHESIA:  General.   BRIEF CLINICAL NOTE:  The patient is an 77 year old widowed white female  from [place] with long history of mitral valve prolapse and mitral  regurgitation.  The patient was first noted to have heart murmur during  childhood.  She has otherwise remained remarkably healthy all of her  life.  In October 2009, she first presented with atrial fibrillation and  congestive heart failure.  She underwent cardioversion and maintained  sinus rhythm for several months.  She went back into atrial fibrillation  and was cardioverted again in March 2010.  The patient has continued to  have worsening symptoms of congestive heart failure.  Transesophageal  echocardiogram performed on October 26, 2008 revealed bileaflet prolapse  of the mitral valve with severe (4+) mitral regurgitation.  Left and  right heart catheterization was notable for the presence of normal  coronary artery anatomy with no significant coronary artery  disease.  Left ventricular systolic function was normal.  Full consultation has  been dictated previously.  The patient now presents for elective  surgical intervention.  She understands and accepts all potential  associated risks of surgery and desires to proceed with surgery as  described.   OPERATIVE FINDINGS:  1. Billowing bileaflet prolapse of the mitral valve with Barlow-type      degenerative mitral valve disease.  2. Severe calcification of the posterior mitral annulus.  3. Normal left ventricular systolic function.  4. Mild aortic insufficiency.  5. Moderate left atrial enlargement.  6. No residual mitral regurgitation following successful mitral valve      repair.   OPERATIVE NOTE IN DETAIL:  The patient was brought to the operating room  on the above-mentioned date and central monitoring was established by  the anesthesia team under the care and direction of Dr. Kipp Brood.  Specifically, a Swan-Ganz catheter was placed through the left internal  jugular approach.  A radial arterial line was placed.  Intravenous  antibiotics were administered.  Following induction with general  endotracheal anesthesia, Foley catheter was placed.  The patient was  placed in the supine position with a soft roll behind  the right scapula  and the neck gently extended and turned towards the left.  The patient's  right neck, chest, abdomen, both groins, and both lower extremities were  prepared and draped in sterile manner.   Baseline transesophageal echocardiogram was performed by Dr. Noreene Larsson.  This demonstrates bileaflet prolapse of the mitral valve with severe  (4+) mitral regurgitation.  There was calcification of the posterior  mitral annulus.  There was mild aortic insufficiency.  Left ventricular  systolic function appears normal.   A small incision was made in the right inguinal crease.  The anterior  surface of the right common femoral artery and right common femoral vein  were  dissected through the small incision in the right groin.  Care was  taken to avoid significant dissection and just enough exposure was  performed to facilitate anterior exposure of the vessels.  The femoral  vein was dissected at the level of the greater saphenous bulb.  The  femoral artery was soft with an excellent pulse, although there was some  scarring and adhesions likely related to previous heart catheterization.   Single lung ventilation was begun.  Right miniature anterolateral  thoracotomy incision was performed immediately overlying the third  intercostal space in the anterior axillary line.  The right pleural  space was entered uneventfully.  There were no adhesions.  Two 10-mm  thoracoscopic ports were placed through separate port incisions in the  inframammary crease.  The chest was insufflated with carbon dioxide gas  continuously through one of the thoracoscopic ports.  A soft tissue  retractor was placed and the ribs were gently spread.  Exposure was felt  to be excellent.  A 2-0 Ethibond pledgeted stitch was placed in the dome  of the right hemidiaphragm to retract the diaphragm inferiorly.  A  longitudinal incision was made in the pericardium 2 cm anterior to the  phrenic nerve.  Silk traction sutures were placed in either direction to  facilitate exposure.   The patient was heparinized systemically.  The greater saphenous bulb  was ligated and a femoral venous cannula was advanced through the  saphenous bulb into the femoral vein up through the inferior vena cava  through the right atrium into the superior vena cava using  transesophageal echocardiogram for guidance.  The femoral vein was  dilated with serial dilators and a 22-French femoral venous cannula was  advanced over the guidewire up through the inferior vena cava through  the right atrium until the tip extends into the superior vena cava,  again using transesophageal echocardiogram for guidance.  The right   common femoral artery was cannulated using the Seldinger technique  through small concentric pursestring sutures.  A flexible guidewire was  advanced up through the femoral artery into the descending thoracic  aorta.  The femoral artery was dilated with serial dilators and a 16-  French femoral arterial cannula was advanced uneventfully.  A small stab  incision was made low in the right neck.  The right internal jugular  vein was cannulated with a Seldinger technique and a flexible guidewire  was advanced into the right atrium.  The internal jugular vein was  dilated with serial dilators and a 14-French femoral pediatric venous  cannula was advanced over the guidewire into the superior vena cava.   Cardiopulmonary bypass was begun.  Vacuum-assisted venous drainage was  utilized.  Drainage was notably excellent.  The pericardial incision was  extended in both directions.  The undersurface of the aorta was  dissected away from the anterior surface of the right pulmonary artery.  The pursestring sutures were placed in the right atrium, and a  retrograde cardioplegic cannula was placed through the pursestring  suture into the coronary sinus using transesophageal echocardiogram for  guidance.  An antegrade cardioplegic cannula was placed in the  anterolateral surface of the ascending thoracic aorta through two  concentric pursestring sutures.   Aortic crossclamp was applied and cold blood cardioplegia was delivered  initially in antegrade fashion through the aortic root.  The initial  cardioplegic arrest was rapid with excellent diastolic arrest.  Supplemental cardioplegia was administered retrograde through the  coronary sinus catheter.  Repeat doses of cardioplegia were administered  intermittently throughout the crossclamp portion of the operation  retrograde through the coronary sinus catheter every 20 minutes to  maintain completely flat electrocardiogram.  Myocardial protection was   felt to be excellent.   A left atriotomy incision was performed posteriorly through the  interatrial groove and extended across the back wall of the left atrium  after opening the oblique sinus.  The floor of the left atrium and the  mitral valve were exposed by placing a retractor blade through the  incision and attaching it to the separate side arm passed through the  stab incision through the third intercostal space just to right side of  the sternum.  Exposure was felt to be excellent.  The mitral valve was  carefully examined.  There was bileaflet prolapse of the mitral valve  with a large-sized valve and thickening of both anterior and posterior  leaflets.  There was severe calcification of the posterior annulus, but  all three segments of the posterior leaflet remained mobile.  There was  severe prolapse of P1 and moderate prolapse of P2 with mild prolapse of  P2 and P3.  There were no flail segments.  There was mild elongation of  the subvalvular apparatus.  Gross anatomical findings were consistent  with longstanding Barlow-type mitral valve disease.   The left side lesion set of the Cox CryoMaze procedure was now completed  using AtriCure cryothermy probe.  Each lesion was performed with a 2-  minute freeze per manufacturer protocol.  Initially, a lesion was placed  posteriorly from the inferior apex of the atriotomy incision across the  back wall of the left atrium onto the coronary sinus posteriorly along  the epicardial surface.  A mirror image lesion was placed along the  endocardial surface onto the posterior mitral annulus.  An elliptical  lesion was then placed around the pulmonary veins extending across the  dome of the left atrium from the cephalad apex of the atriotomy incision  to reach the cephalad rim of the left-sided pulmonary veins.  A mirror  image lesion was created across the back wall of the left atrium from  the caudad apex of the atriotomy incision to  reach the caudad apex of  the left-sided pulmonary veins.  This completes the left side lesion set  of the Cox CryoMaze procedure.   The left atrial appendage was oversewn from within the left atrium using  a two-layer closure of running 3-0 Prolene suture.   Interrupted 2-0 Ethibond horizontal mattress sutures were placed  circumferentially around the entire mitral annulus.  These sutures will  ultimately be utilized for ring annuloplasty and at this juncture, they  were utilized to suspend the valve symmetrically for subsequent repair.  The valve was again carefully reexamined.  Valve repair was accomplished  using  a series of interrupted CV5 Gore-Tex sutures to close the large  gaping commissure between P2 and P3.  The prolapsed segment of P1 was  now corrected by plicating P1 in two locations in closing the anterior  lateral commissure.  All of these were performed with interrupted CV5  everting Gore-Tex sutures.  At this juncture, the valve was tested and  appears to be competent with a symmetrical line of coaptation.  The  valve was sized to accept a 34-mm annuloplasty ring.  A Sorin MEMO 3-D  annuloplasty ring (catalog number [id number], serial number [id number]) is  secured in place uneventfully.  After completion of valve repair, the  valve appears to be perfectly competent on saline testing with no  residual regurgitation.  Rewarming was begun.   The left atriotomy incision was closed posteriorly using a two-layer  closure of running 3-0 Prolene suture.  A drain was placed across the  mitral valve to serve as a left ventricular vent and kept on Rumel  tourniquet just prior to completion of the atriotomy closure.  Epicardial pacing wires were fixed to the undersurface of the right  ventricular free wall.  The patient was placed in Trendelenburg  position.  One final dose of warm retrograde hot shot cardioplegia was  administered.  The aortic crossclamp was removed after total  crossclamp  time 135 minutes.   The heart began to beat spontaneously without need for cardioversion.  The retrograde cardioplegic cannula was removed.  The lungs were  ventilated and the heart allowed to fill briefly, and the left  ventricular vent was then removed.  Atrial pacing wires were fixed to  the right atrial appendage.  The patient was rewarmed to 37 degrees  centigrade temperature.  Ventilation was temporarily begun and the heart  allowed to fill to carefully reinstitute it and the antegrade  cardioplegic cannula was removed.  Meticulous hemostasis was  ascertained.  The patient was now weaned from cardiopulmonary bypass  without difficulty.  The patient's rhythm at separation from bypass was  normal sinus rhythm.  The atrial pacing was employed to increase the  heart rate.  Total cardiopulmonary bypass time for the operation was 188  minutes.   Followup transesophageal echocardiogram performed by Dr. Noreene Larsson after  separation from bypass demonstrates a well-seated mitral annuloplasty  ring.  There was no sign of perivalvular leak around the ring.  The  valve was functioning normally and there was no residual mitral  regurgitation whatsoever.  No other abnormalities were noted.   Protamine was administered to reverse the anticoagulation.  The femoral  venous cannula was removed and the greater saphenous bulb was ligated.  The femoral arterial cannula was removed and the pursestring sutures  were secured.  There was a palpable pulse in the distal femoral artery.  The right internal jugular cannula was removed and the cannulation site  controlled with everting mattress suture and direct manual pressure for  58 minutes.  Single lung ventilation was now begun.  An additional  suture was placed in the left atriotomy closure line to facilitate  meticulous hemostasis.  The mediastinum was now drained using a 28-  French Bard drain exited through the anterior port incision and  placed  through the oblique sinus.  The pericardium was reapproximated loosely  laterally using a patch of CorMatrix bovine submucosa patch.  The right  chest was now irrigated with saline solution and inspected for  hemostasis.  The On-Q continuous pain management system was utilized to  facilitate postoperative pain control.  One 5-inch catheter was supplied  with the On-Q kit, it was tunneled initially through the subcutaneous  tissues and placed through the posterior port incision and tunneled into  the subpleural space posteriorly to cover the second through the sixth  intercostal spaces.  The catheter was flushed with 5 mL of 0.5%  bupivacaine solution and ultimately connected to continuous infusion  pump.  The pleural space was drained with a 28-French Bard drain placed  through the posterior port incision.  The thoracotomy incision was  closed in multiple layers in routine fashion.  The right groin incision  was inspect for hemostasis and subsequently closed in multiple layers in  routine fashion.  All skin incisions were closed with subcuticular skin  closures with Dermabond adhesive covering.   The patient tolerated the procedure well.  The chest tubes were fixed to  closed suction drainage devices.  The patient was reintubated using a  single-lumen endotracheal tube and subsequently transported to the  surgical intensive care unit in stable condition.  There were no  intraoperative complications.  All sponge, instrument, and needle counts  verified and correct at completion of the operation.  The patient was  transfused 2 units packed red blood cells during cardiopulmonary bypass  due to anemia that was present prior to surgery and exacerbated with  hemodilution.  The patient was transfused two packs of adult platelets  and 2 units of fresh frozen plasma for dilution coagulopathy following  separation from bypass.      Salvatore Decent. Cornelius Moras, M.D.  Electronically Signed      CHO/MEDQ  D:  12/14/2008  T:  12/14/2008  Job:  [id number]   cc:   Lyn Records, M.D.  Theressa Millard, M.D.

## 2010-11-21 NOTE — H&P (Signed)
[name], [name] NO.:  [id number]   MEDICAL RECORD NO.:  [id number]          PATIENT TYPE:  INP   LOCATION:  3735                         FACILITY:  [facility]   PHYSICIAN:  Brayton El, MD    DATE OF BIRTH:  12/24/1925   DATE OF ADMISSION:  05/04/2008  DATE OF DISCHARGE:                              HISTORY & PHYSICAL   Attending physician will be Dr. Katrinka Blazing of [facility].   CHIEF COMPLAINT:  Racing heart.   HISTORY OF PRESENT ILLNESS:  [name] is an 77 year old white female who  was recently diagnosed with atrial fibrillation and placed on Coumadin,  who is presenting with a 2-hour history of racing heart associated  with shortness of breath, diaphoresis and dizziness.  The patient states  approximately 1 week ago she was told that she had atrial fibrillation  and was initially placed on aspirin but was unable to tolerate this.  Subsequently she was placed on Coumadin and has had her atenolol dose  titrated in order to obtain improved rate control.  This patient states  that today while at rest, she felt her heart rate became very fast.  She  became dizzy, short of breath and mildly diaphoretic.  These symptoms  lasted approximately 2 hours and resolved on their own.  Upon arrival to  the emergency room she was in atrial fibrillation with a ventricular  response of approximately 103 beats per minute.  She is currently  asymptomatic.  The patient states that she has intermittently felt her  heart racing over the past week.   PAST MEDICAL HISTORY:  1. Hypertension.  2. Questionable history of SVT.  3. Mitral valve prolapse.  4. Breast cancer, status post lumpectomy.  5. Gastroesophageal reflux disease.  6. She has had appendectomy, hysterectomy and C-section.  7. She has lumbar spondylosis.   FAMILY HISTORY:  Unremarkable.   SOCIAL HISTORY:  She lives by herself in [place].  No alcohol or  tobacco use.   ALLERGIES:  She is allergic to  CIPRO, CODEINE, VALIUM, VERSED, SULFA.   HOME MEDICATIONS:  The patient takes atenolol 50 mg p.o. q.h.s. and  Coumadin 5 mg p.o. q.h.s.   REVIEW OF SYSTEMS:  Positive for increased urinary frequency.  All other  systems as in HPI, otherwise negative.   PHYSICAL EXAMINATION:  She has a temperature of 97.1, pulse 88,  respirations 26, blood pressure 129/83.  GENERAL:  She is in no acute distress.  HEENT: Nonfocal.  Normocephalic, atraumatic.  NECK:  Supple.  There is no JVD.  HEART:  Irregularly irregular and tachycardiac with no murmur, rub, or  gallop.  LUNGS:  Clear to auscultation bilaterally.  ABDOMEN:  Soft, nontender, nondistended.  EXTREMITIES:  There is trace bilateral lower extremity edema.  SKIN:  Cool and dry.  NEUROLOGIC:  Nonfocal.  PSYCHIATRIC:  The patient is appropriate with normal levels of insight.   LABORATORY DATA:  Sodium 140, potassium 3.7, chloride 104, CO2 23, BUN  9, creatinine 0.7, glucose 104.  White count of 6, hemoglobin 14,  hematocrit 41, platelet count 144.  Her MCV is 102.  Troponin less than  0.05, MB 2.0.  INR 1.4.  Chest X-Ray:  Cardiomegaly and hyperinflation  consistent with COPD.  EKG independently reviewed by myself demonstrates  atrial fibrillation/atrial flutter with a heart rate of 103 beats per  minute.  EKG dated May 03, 2008:  There has been no significant  change except for the heart rate has increased.   ASSESSMENT:  Considering the patient's history, her symptoms are most  consistent with atrial fibrillation with rapid ventricular response.   PLAN:  She will be admitted to a telemetry unit for monitoring.  We will  start her on IV heparin as her INR remains subtherapeutic.  Will  continue her Coumadin.  We will continue her atenolol dose at 50 mg p.o.  q.h.s., which she has not taken tonight.  This patient states she has  had an echocardiogram in the past.  However, these results are not  currently available to [place].  Would  consider either increasing her  atenolol dose or perhaps starting Cardizem in order to obtain better  rate control.  We will replace her potassium, which is slightly low, and  we will check a magnesium level.  We will also check a TSH.  Lastly, the  patient has a macrocytosis so we will check a B12 and folate level.  She  will be ruled out for a myocardial infarction although we think this is  an extremely unlikely diagnosis.      Brayton El, MD  Electronically Signed     SGA/MEDQ  D:  05/04/2008  T:  05/05/2008  Job:  [id number]

## 2010-11-21 NOTE — Consult Note (Signed)
[name], [name]                 ACCOUNT NO.:  [id number]   MEDICAL RECORD NO.:  [id number]          PATIENT TYPE:  INP   LOCATION:  1414                         FACILITY:  [facility]   PHYSICIAN:  Petra Kuba, M.D.    DATE OF BIRTH:  1925-08-17   DATE OF CONSULTATION:  12/25/2007  DATE OF DISCHARGE:                                 CONSULTATION   HISTORY:  The patient is seen at the request of Dr. Earl Gala for  abdominal pain and diarrhea.  Both came on suddenly yesterday.  Initially, she had pain which brought her to the ER and once in the ER,  the diarrhea started.  This did come before she got the CT contrast.  She had no blood in the diarrhea.  Was put on vanco and flagyl.  Stool  studies were done and she is feeling much better day without diarrhea  today and without any pain.  She has never had any pain like this  before.  Supposedly Dr. Sherin Quarry had done a colonoscopy one year ago  without obvious problems.  She had been on a few antibiotics for some  dental work recently and has a long history of problems with antibiotics  in the past.   PAST MEDICAL HISTORY:  1. Pertinent for increased cholesterol.  2. Fatigue.  3. SVT.  4. Mitral valve prolapse.  5. Breast cancer and lumpectomy.  6. Reflux.  7. Near syncope.  8. Arthritis.  9. Back problems.  10.Spondylosis.  11.Radiculopathy.  12.Hysterectomy.  13.Appendectomy.   HOME MEDICATIONS:  Recently include  1. Amoxicillin.  2. Lorazepam.  3. Macrodantin.  4. Atenolol.  5. Multivitamins.   SOCIAL HISTORY:  Does not drink or smoke.   FAMILY HISTORY:  Negative for any colitis or GI problems.   REVIEW OF SYSTEMS:  Pertinent for she also got on antibiotics not  mentioned above for some cystitis which she has had recurringly and that  seems to be better.   PHYSICAL EXAMINATION:  GENERAL:  In no acute distress.  VITAL SIGNS:  Stable, afebrile.  Exam pertinent for abdomen being soft and nontender, good bowel sounds,  good peripheral pulses, no pedal edema.   LABORATORY DATA:  Pertinent for a white count initially 11, which has  dropped to normal.  Hemoglobin was 14.6, that has dropped a little bit  with hydration.  BUN and creatinine are normal.  Liver tests are normal.  Albumin 4.2.  Initial Clostridium difficile negative.  CT of the abdomen  pertinent for patulous gastroesophageal junction.  Left kidney  abnormality which she has had ablation for not mentioned above as well  as some descending colitis without any obvious complications.   ASSESSMENT:  1. Multiple medical problems.  2. Resolved pain and diarrhea, questionable ischemic although she did      not see any blood versus antibiotic associated with a negative      Clostridium difficile.  3. Descending inflammation on CT with history of negative colonoscopy      a year ago.   PLAN:  I discussed flexible sigmoidoscopy versus colonoscopy with  the  patient as requested by Dr. Earl Gala.  She actually prefers to eat and  then go home.  I then discussed that option with Dr. Earl Gala, who agrees  but the patient and I did discuss we can not give her an answer by not  doing the test and if either the diarrhea continues or if the pain  recurs, she will need further work-up and she agrees.  We have elected  to hold off on antibiotics since she has had problems with them before  and her initial Clostridium difficile was negative.  Hopefully she will  tolerate food and Dr. Earl Gala or one of his partners will check after  dinner as we had discussed and she will be able to go home.  I am happy  to see her back p.r.n. or Dr. Sherin Quarry can see back in followup as  needed.           ______________________________  Petra Kuba, M.D.     MEM/MEDQ  D:  12/25/2007  T:  12/25/2007  Job:  [id number]   cc:   Tasia Catchings, M.D.  Fax: [phone]   Theressa Millard, M.D.  Fax: [phone]

## 2010-11-21 NOTE — Cardiovascular Report (Signed)
[name], BRACCO NO.:  [id number]   MEDICAL RECORD NO.:  [id number]          PATIENT TYPE:  OBV   LOCATION:  3735                         FACILITY:  [facility]   PHYSICIAN:  Lyn Records, M.D.   DATE OF BIRTH:  March 20, 1926   DATE OF PROCEDURE:  11/11/2008  DATE OF DISCHARGE:                            CARDIAC CATHETERIZATION   REASON FOR EVALUATION:  Mitral regurgitation with new-onset heart  failure.   PROCEDURE PERFORMED:  1. Left heart cath.  2. Right heart cath.  3. Coronary angiography.  4. Left ventriculography.   DESCRIPTION:  After informed consent and following 50 mcg of fentanyl, a  4-French arterial sheath was placed in the right femoral artery using  the modified Seldinger technique.  A 7-French sheath was placed in the  right femoral vein.  Xylocaine was used for local anesthesia.  We used a  7-French Swan-Ganz catheter for right heart hemodynamic recordings and  thermodilution cardiac output.  We used a pigtail 4-French catheter in  the left heart with __________ recordings, and left ventriculography by  power injection.  Unfortunately, 2 power injections were required  because of recoil of the catheter into the ascending aorta on the first  injection.  For both injections, a rate of 16 mL per second was used.  A  pullback pressure was then recorded and this catheter was removed and  replaced successfully with a left #4 Judkins and a right no-torque 4-  [place] Judkins catheter.  The patient had no complications during the  procedure.  Hemostasis was achieved by manual compression.   RESULTS:  1. Hemodynamic data:      a.     Aortic pressure 168/74 mmHg.      b.     Left ventricular pressure 168/90 mmHg.      c.     Right atrial mean pressure 3 mmHg.      d.     Right ventricular pressure of 43/90 mmHg.      e.     Pulmonary artery pressure 34/11 mmHg.      f.     Mean pulmonary capillary wedge pressure 16 mmHg with a V-       wave of 25  mmHg.      g.     Cardiac output by thermodilution 5.2 L per minute and by       Fick 5.4 L per minute.  2. Left ventriculography:  Left ventriculography demonstrated 3+      mitral regurgitation, normal left ventricular function.  Ejection      fraction estimated to be 60%.  3. Coronary angiography:  4. Left main coronary:  Patent.  5. Left anterior descending:  This vessel is large and widely patent.      Ectasia is noted proximally.  Diagonal branches are widely patent.  6. Circumflex artery:  Widely patent.  7. Right coronary:  Dominant and widely patent.   CONCLUSION:  1. Moderately severe to severe mitral regurgitation.  2. Mild pulmonary hypertension.  3. Widely patent coronaries.  4. Normal left ventricular function with  ejection fraction greater      than 60%.   PLAN:  Refer to Dr. Tressie Stalker for consideration of mitral valve  repair.      Lyn Records, M.D.  Electronically Signed     HWS/MEDQ  D:  11/11/2008  T:  11/12/2008  Job:  [id number]   cc:   Salvatore Decent. Cornelius Moras, M.D.  Theressa Millard, M.D.

## 2010-11-21 NOTE — Consult Note (Signed)
[name], KEES NO.:  [id number]   MEDICAL RECORD NO.:  [id number]          PATIENT TYPE:  INP   LOCATION:  3739                         FACILITY:  [facility]   PHYSICIAN:  Hillis Range, MD       DATE OF BIRTH:  1926-04-07   DATE OF CONSULTATION:  01/04/2009  DATE OF DISCHARGE:                                 CONSULTATION   REQUESTING PHYSICIAN:  Lyn Records, MD   REASON FOR CONSULTATION:  Atypical atrial flutter.   HISTORY OF PRESENT ILLNESS:  Ms. [name] is a pleasant 77 year old female  with a history of rheumatic heart disease, persistent atrial  fibrillation, and severe mitral regurgitation status post recent mitral  valve repair and cryo Cox-Maze by Dr. Tressie Stalker on December 14, 2008, who  presents with symptomatic atypical atrial flutter.  The patient reports  initially being diagnosed with symptomatic atrial fibrillation in  September 2009 after developing recurrent symptoms of fatigue and  shortness of breath.  She was initiated on flecainide and cardioverted  in November 2009.  She reports significant improvement in her symptoms.  She subsequently developed recurrent symptomatic atrial fibrillation and  required repeat cardioversion in March 2010, despite medical therapy  with flecainide.  She was found to have severe mitral regurgitation and  was evaluated by Dr. Tressie Stalker.  She subsequently underwent  minimally invasive mitral valve repair and cryo Cox-Maze on December 14, 2008.  She reports a relatively uneventful postoperative course.  She  has unfortunately developed recurrent atrial fibrillation.  She was  admitted on January 03, 2009, after developing rapid ventricular rates.  Upon presentation to the emergency room, she is noted to have atypical  atrial flutter.  Review of her telemetry reveals both atypical atrial  flutter and coarse atrial fibrillation.  Her heart rates have  subsequently been controlled and her symptoms of fatigue and  shortness  of breath have significantly improved.  She is presently resting  comfortably and is without complaint.   PAST MEDICAL HISTORY:  1. Rheumatic heart disease.  2. Persistent atrial fibrillation.  3. Severe mitral regurgitation status post mitral valve repair using a      minimally invasive of approach with a cryo Cox-Maze performed by      Dr. Tressie Stalker on December 14, 2008.  4. History of SVT, well controlled with vagal maneuvers and atenolol.  5. Hypertension.  6. Right breast cancer status post lumpectomy.  7. GERD.  8. Stress urinary incontinence.  9. Osteopenia.  10.Degenerative disk disease.  11.Recurrent UTIs and cystitis.  12.History of segmental colitis.   MEDICATIONS:  Reviewed in the patient's MAR.   ALLERGIES:  1. SULFA.  2. CIPROFLOXACIN.   SOCIAL HISTORY:  The patient is widowed.  She lives alone in [place].  She has 3 grown children.  She denies tobacco, alcohol, or drug use.   FAMILY HISTORY:  No significant arrhythmias.   REVIEW OF SYSTEMS:  All systems were reviewed and negative except as  outlined in the HPI above.   Telemetry reveals atypical atrial flutter and coarse atrial fibrillation  with improved ventricular rates.   PHYSICAL EXAMINATION:  VITAL SIGNS:  Blood pressure 113/63, heart rate  60, respirations 18, saturation 95% on room air, and afebrile.  GENERAL:  The patient is an elderly female in no acute distress.  She is  alert and oriented x3.  HEENT:  Normocephalic and atraumatic.  Sclerae clear.  Conjunctivae  pink.  Oropharynx clear.  NECK:  Supple.  No thyromegaly, JVD, or bruits.  LUNGS:  Clear to auscultation bilaterally.  HEART:  Irregularly irregular rhythm.  No murmurs, rubs, or gallops.  She has a crisp S2.  GI:  Soft, nontender, and nondistended.  Positive bowel sounds.  EXTREMITIES:  Lower extremity 1+ edema.  NEUROLOGIC:  Cranial nerves II through XII are intact.  Strength and  sensation are intact.  SKIN:  No  ecchymoses or lacerations.  MUSCULOSKELETAL:  No deformity or atrophy.  PSYCH:  Euthymic mood.  Full affect.   I have 2 separate EKGs, one reveals atypical atrial flutter with an  average ventricular rate of 85 beats per minute.  The other reveals  coarse atrial fibrillation with a variable RR intervals and heart rate  in the 80s.   The patient had an MRI today of the cervical spine which reveals  progressive degenerative changes, most prominent at C5-C6.   Chest x-ray reveals cardiomegaly without significant edema.   LABORATORY DATA:  INR 3.1.  TSH 0.856.  Creatinine 0.53.   IMPRESSION:  Ms. [name] is a pleasant 77 year old female with a history of  rheumatic heart disease, persistent atrial fibrillation, and severe  mitral regurgitation status post recent minimally invasive mitral valve  repair and Cox cryo Maze who now presents with atypical atrial flutter  and coarse atrial fibrillation with rapid ventricular rates.  The  patients with rheumatic heart disease tend to be the most difficult to  treat from the standpoint of atrial arrhythmias.  The patient did have a  recent cryo Cox-Maze, I think that we should therefore attempt to pursue  sinus rhythm.  I would therefore recommend that the patient discontinue  flecainide and be started on amiodarone.  Once she has been adequately  loaded with oral amiodarone, we could bring her back for cardioversion  at that time.  We will need to  follow her INRs closely as start amiodarone to prevent supratherapeutic  INRs.  The patient appears to be approaching adequate heart rate control  and her symptoms have significantly improved.  I have discussed the  above with Dr. Verdis Prime and we will follow the patient during her  hospital stay.      Hillis Range, MD  Electronically Signed     JA/MEDQ  D:  01/04/2009  T:  01/05/2009  Job:  [id number]   cc:   Lyn Records, M.D.

## 2010-11-21 NOTE — Assessment & Plan Note (Signed)
OFFICE VISIT   [name], [name]  DOB:  1925/09/14                                        December 12, 2009  CHART #:  [id number]   HISTORY OF PRESENT ILLNESS:  This is an 77 year old Caucasian female who  is status post mitral valve repair and Cox CryoMaze procedure by Dr.  Cornelius Moras on December 14, 2008.  The patient was last seen in the office on  July 04, 2009, and at that time was having no tachy palpitations or  shortness of breath.  She actually was feeling quite well overall.  She  continues to remain on Coumadin for her atrial fibrillation.  She  currently denies any shortness of breath or chest pain.  She does notice  some discomfort under the incision site of her right arm and previously  had occasional discomfort in her right groin but this is less so now.   PHYSICAL EXAMINATION:  General:  This is a pleasant 77 year old  Caucasian female who is in no acute distress who is alert, oriented, and  cooperative.  Vital Signs:  BP 141/85, heart rate 70, respiration 18, O2  sat 98% on room air.  Cardiovascular:  Regular rate and rhythm.  S1 and  S2.  No murmurs, gallops, or rubs.  Pulmonary:  Clear to auscultation  bilaterally.  No rales, wheezes, or rhonchi.  Right chest wound is  clean, dry, and well healed.   IMPRESSION AND PLAN:  A rhythm strip was obtained at this office visit  and indicates the patient is in sinus rhythm.  Dr. Cornelius Moras had a discussion  with the patient regarding her Coumadin therapy.  The patient wishes to  discuss with Dr. Katrinka Blazing as to whether or not she should have a Holter or  CardioNet monitor to determine if she is continuing to remain in sinus  rhythm.  If this is in fact the case, then hopefully her Coumadin may be  stopped.  The patient is going to schedule an appointment to see Dr.  Katrinka Blazing regarding this.  If the patient is taken off Coumadin, it  will be recommended that she be continued on enteric-coated aspirin 325  mg p.o. daily.  The  patient will return to see Dr. Cornelius Moras within 1 year.   Salvatore Decent. Cornelius Moras, M.D.  Electronically Signed   DZ/MEDQ  D:  12/12/2009  T:  12/13/2009  Job:  [id number]   cc:   Lyn Records, M.D.  Theressa Millard, M.D.  Hillis Range, MD

## 2010-11-21 NOTE — Discharge Summary (Signed)
[name], [name]                 ACCOUNT NO.:  [id number]   MEDICAL RECORD NO.:  [id number]          PATIENT TYPE:  INP   LOCATION:  1414                         [facility]ACILITY:  [facility]   PHYSICIAN:  Theressa Millard, M.D.    DATE OF BIRTH:  Nov 04, 1925   DATE OF ADMISSION:  12/24/2007  DATE OF DISCHARGE:  12/25/2007                               DISCHARGE SUMMARY   ADMITTING DIAGNOSIS:  Colitis.   DISCHARGE DIAGNOSES:  1. Segmental colitis of the left colon.  Doubt Clostridium difficile.      Possible ischemic colitis.  2. Mitral valve prolapse.  3. History of breast cancer.  4. Gastroesophageal reflux disease.  5. Lumbar spondylosis.  6. Recurrent urinary tract infections.   The patient is an 77 year old white female who presented last night to  the emergency room with a several-hour history of grabbing constant  lower abdominal pain.  It was of sudden onset.  A CT scan in the  emergency department revealed a segmental colitis involving the left  colon.  She was admitted.   HOSPITAL COURSE:  The patient was admitted and given a dose of IV  vancomycin, to which she developed itching and red skin.  She was  treated with Benadryl.  She was also treated with IV Flagyl x2.  IV  vancomycin was changed to p.o. vancomycin for treating possible C.  difficile.  The C. difficile toxin, however, came back negative.  She  was seen in consultation by Dr. Ewing Schlein of gastroenterology, who thought  that the patient was not likely to have C. difficile despite some recent  antibiotics.  She was actually improving quickly, and so the patient was  discharged in improved condition.   DISCHARGE MEDICATIONS:  Lorazepam 0.5 mg 1 to 2 daily p.r.n.   ACTIVITIES:  As tolerated.   DIET:  Lactose free, low residue x 3 to 4 days, then resume a regular  diet.   FOLLOWUP:  She will keep her scheduled appointments with me in the  future.      Theressa Millard, M.D.  Electronically Signed     JO/MEDQ  D:   12/25/2007  T:  12/25/2007  Job:  [id number]

## 2010-11-21 NOTE — Assessment & Plan Note (Signed)
OFFICE VISIT   [name], [name]  DOB:  [birth year]                                        December 31, 2008  CHART #:  [id number]   HISTORY OF PRESENT ILLNESS:  The patient is status post right miniature  anterolateral thoracotomy for mitral valve repair with plication of  posterior leaflet commissures x2, anterolateral commissuroplasty x1 with  a 34-mm Sorin Memo 3-D ring annuloplasty.  The patient also underwent  Cox-CryoMaze procedure of left side lesions.  This was done by Dr. Cornelius Moras  on December 14, 2008.  The patient's postoperative course was pretty much  unremarkable.  She did have a fall during her hospital stay where a CT  scan was done and, however, it showed no acute abnormalities.  The  patient was discharged to home on postop day #5.  At time of discharge,  the patient was noted to be in normal sinus rhythm.  The patient  presents today for followup visit.  She is complaining of increasing  fatigue, dizziness, not being able to sleep at night, increasing pain.  She states several days ago, she took Ultram and started feeling  nauseated.  Also, noted her heart skipping beats.  She was seen by Dr.  Katrinka Blazing, who noted her blood pressure to be low and discontinued her  Norvasc.  The patient states that physical therapy has been coming on  working with her.  She states she has started to use her cane again with  ambulation.  She denies any shortness of breath, opening or drainage  from any of her incision sites, or fevers.  Cardiac rehab has contacted  her.  At this time, she does not feel that she is ready.  The patient  also complains that her appetite has not come back completely.  She is  tolerating regular diet, but is not eating as much.   PHYSICAL EXAMINATION:  VITAL SIGNS:  Blood pressure 126/77, pulse of 86,  respirations of 18, and O2 sats 98% on room air.  RESPIRATORY:  Clear to auscultation bilaterally.  CARDIAC:  Irregularly regular.  No murmurs or  rubs noted.  ABDOMEN:  Bowel sounds x4.  Soft.  EXTREMITIES:  No edema noted.  INCISIONS:  All incisions are clean, dry, intact, and healing well.  Chest tube sutures were removed x2.   STUDIES:  The patient had PA and lateral chest x-ray done today which is  stable with mild right lower lobe atelectasis.  No pneumothorax or  pneumonia noted.  The patient had EKG strip run which shows to be still  in atrial fibrillation.  Heart rate in the 80s.   IMPRESSION AND PLAN:  The patient is doing well postoperatively.  She  was seen and evaluated by Dr. Cornelius Moras.  It is felt that with the patient's  complaints of fatigue during the day and not sleeping at night secondary  to pain, she was given a prescription for oxycodone to take at night to  assist with pain management and possibly help with her sleep.  It is  felt the patient's fatigue might be secondary to her blood pressure  being on the lower side.  Dr. Michaelle Copas office had already discontinued  the Norvasc, has decided to cut her lisinopril in half to 20 mg daily  and she is to take the lisinopril at night  with the atenolol at night.  We will see how this does in helping the patient's fatigue during the  day.  The patient is told to continue her diet as tolerated.  She is to  continue working with physical therapy and once her fatigue has  improved, she is to start cardiac rehab.  The patient is told to hold  off on driving until her fatigue has improved as well.  After this has  improved, it is okay for her to drive.  She has no restrictions on  physical activity at this time.  The patient is to follow up with Dr.  Katrinka Blazing as scheduled on Monday.  We will plan to see the patient back in 6  weeks for reevaluation.  The patient is told in the interim if she has  any surgical issues, she is to contact us.   Salvatore Decent. Cornelius Moras, M.D.  Electronically Signed   KMD/MEDQ  D:  12/31/2008  T:  01/01/2009  Job:  [id number]   cc:   Lyn Records, M.D.

## 2010-11-21 NOTE — Consult Note (Signed)
[name], HOUSE NO.:  [id number]   MEDICAL RECORD NO.:  [id number]          PATIENT TYPE:  OIB   LOCATION:  2899                         FACILITY:  [facility]   PHYSICIAN:  Lyn Records, M.D.   DATE OF BIRTH:  [birth year]   DATE OF CONSULTATION:  DATE OF DISCHARGE:  09/10/2008                                 CONSULTATION   INDICATIONS:  Recurrent atrial fibrillation with symptoms including  fatigue and dyspnea.  The patient's flecainide dose has been increased  to 100 mg twice a day.  She will have elective cardioversion today to  reestablish sinus rhythm.   PROCEDURE PERFORMED:  Electrical cardioversion, elective description:  The patient was seen by Dr. Bedelia Person.  She was attended by a nurse  anesthetist.  A 125 mg total of sodium Pentothal was administered.  Using an anterior and posterior electrode configuration, 200 watts of  biphasic energy was administered with reversion to normal sinus rhythm.  Sinus bradycardia was the ultimate rhythm with heart rates in the 55 per  minute range.  No complications occurred.  The patient awakened without  neurological sequelae.   CONCLUSIONS:  Successful conversion from atrial fibrillation to normal  sinus rhythm.   PLAN:  Followup in 5 days free EKG.  Office visit in 10 days.  Increase  flecainide to 100 mg p.o. b.i.d.      Lyn Records, M.D.  Electronically Signed     HWS/MEDQ  D:  09/10/2008  T:  09/11/2008  Job:  [id number]   cc:   Theressa Millard, M.D.

## 2010-11-21 NOTE — Consult Note (Signed)
[name], [name]                 ACCOUNT NO.:  [id number]   MEDICAL RECORD NO.:  [id number]          PATIENT TYPE:  INP   LOCATION:  3739                         FACILITY:  [facility]   PHYSICIAN:  Levert Feinstein, MD          DATE OF BIRTH:  24-May-1926   DATE OF CONSULTATION:  01/06/2009  DATE OF DISCHARGE:  01/06/2009                                 CONSULTATION   CLINICAL HISTORY:  Neck pain, radiating to right shoulder.   HISTORY OF PRESENT ILLNESS:  The patient is a pleasant 77 year old  female with history of rheumatic heart disease, persistent atrial  fibrillation, severe mitral valve regurgitation, status post recent  mitral valve repair, and cryo Cox-Maze by Dr. Tressie Stalker on December 14, 2008.   She was in right arm abduction, shoulder external rotation for  Cox-Maze  procedure for 6 hours, 5 days postsurgically, She has developed  persistent nagging achy pain in the right neck radiating to the right  shoulder area, but there was no weakness, no sensory change.  Pain is  worse in lying down position, to its full strength, getting better  when she is sitting up, 3-5/10.  She has tried NSAIDs, treatment, which  has a lot of GI side effects, nausea to the point of throwing up, and  very bothersome by her symptoms.   She was admitted again on January 04, 2009, for rapid cardiac rhythm,  recurrence of atrial fibrillation up to 180s, and her rate is under  control now with amiodarone.  In addition, she is also receiving  warfarin because of her atrial fibrillation with most recent INR of 2.2.   She denies gait difficulty, right arm weakness, but the pain is  persistent, very discomfort for her to the point of difficulty sleeping.   PAST MEDICAL HISTORY:  1. Rheumatic heart disease.  2. Persistent atrial fibrillation.  3. Severe mitral regurgitation, status post mitral valve repair using      minimum invasive procedure with a cryo Cox-Maze by Dr. Tressie Stalker on June 8.  4.  History of SVT, well controlled with vagal maneuver and atenolol.  5. Hypertension.  6. Right breast cancer, status post lumpectomy.  7. GERD.  8. Stress.  9. Urinary incontinence.  10.Osteopenia.  11.Degenerative disk disease.  12.Recurrent UTI, cystitis.  13.Segmental colitis.   ALLERGIES:  SULFA and CIPRO.   SOCIAL HISTORY:  She is widowed.  She lives alone in [place].  She  has 3 grown children.  Denies smoking, drinking, or illicit drugs.   FAMILY HISTORY:  Noncontributory.   CURRENT MEDICATIONS:  Amiodarone, aspirin, atenolol, Decadron 4 mg once  everyday, Colace, Lasix, indomethacin 25 mg t.i.d., lisinopril, Ativan,  Robaxin, potassium, warfarin, Tylenol, morphine, OxyContin, and  Phenergan.   PHYSICAL EXAMINATION:  VITAL SIGNS:  Heart rate of 66, temperature 98.2,  and blood pressure 122/77.  CARDIAC:  Irregular heart rate.  PULMONARY:  Clear to auscultation.  NECK:  Supple.  No carotid bruits.  NEUROLOGIC:  She is awake, alert, oriented to history taking and  care of  conversation.  Cranial nerves II through XII are normal.  Motor  examination, she has mild deconditioning, generalized weakness, but  otherwise motor strength was within normal limits and symmetric.  Deep  tendon reflexes were brisk and symmetric with absent access reflex.  Plantar responses were flexor.  Sensory examination demonstrate lens  dependence, decreased to light touch, pinprick and vibratory sensation  to toes.  Coordination; normal finger-to-nose, heel-to-shin.  No  dysmetria.  Gait steady and mild difficulty with tandem.   MRI of the cervical film is reviewed, there is multilevel cervical  degenerative disk disease, most severe at C5-6 and C6-7.  There are  bulging disk with osteophyte complex, on the C5-6 level extension  towards the right causing moderate bilateral foraminal stenosis, right  worse than left on C6-7 level with mild bilateral foraminal stenosis,  but no cord  compression.   ASSESSMENT AND PLAN:  An 77 year old female presenting with right neck  pain.  The location and MRI findings are in line, indicating right C5-6  cervical radiculopathy.  1. She could not tolerate indomethacin, we will stop it.  We will try      Celebrex.  2. Because of the normal motor, sensory examination, brisk reflex, she      certainly is not a surgical candidate.  I don't think she is a pain      interventional management candidate as well because of her history      of Coumadin and elevated INR.  3. Continue PT/OT.  4. Follow up with [facility] in 1-2 weeks upon discharge.      May consider other antiinflammatory treatment such as steroid,      local patch if symptom persists.      Levert Feinstein, MD  Electronically Signed     YY/MEDQ  D:  01/06/2009  T:  01/07/2009  Job:  [id number]

## 2010-11-21 NOTE — H&P (Signed)
NAMESHANEY, DECKMAN NO.:  [id number]   MEDICAL RECORD NO.:  [id number]          PATIENT TYPE:  INP   LOCATION:  0102                         FACILITY:  [facility]   PHYSICIAN:  Donalynn Furlong, MD      DATE OF BIRTH:  12-Jun-1926   DATE OF ADMISSION:  04/09/2008  DATE OF DISCHARGE:                              HISTORY & PHYSICAL   PRIMARY CARE Jazzmyne Rasnick:  Theressa Millard, M.D. with [facility] Alm.   CHIEF COMPLAINT:  Left shoulder and hand pain, weakness, increased blood  pressure.   HISTORY OF PRESENT ILLNESS:  Ms. [name] is an 77 year old Caucasian  female who lives in [place] by herself.  She presented to [facility] tonight with complaint of 2 weeks duration of generalized weakness,  chills and low energy.  She also mentioned that she developed left  shoulder pain with radiation to left arm and left hand associated with  elevated heart rate tonight.  She denied any chest discomfort prior to  tonight.  She was fine for 2 weeks, but she had low energy, chills and  generalized weakness for about 2 weeks.  She also felt that her blood  pressure has been elevated recently.  She denied any cough, sputum  production, shortness of breath, chest discomfort at this time.  Her  pain was 5/10 at worst associated with generalized weakness, but no  other aggravating or relieving factors.  The patient denied any nausea,  vomiting, diarrhea, abdominal pain, urinary complaint, leg swelling or  headache in the last 2 weeks.  She denied any sick contacts.   PAST MEDICAL HISTORY:  1. Hypertension.  2. Supraventricular tachycardia.  3. Mitral valve prolapse.  4. Appendectomy.  5. Hysterectomy.  6. Cesarean section.  7. History of breast cancer status post lumpectomy.  8. Gastroesophageal reflux disease.  9. Lumbar spondylosis.   FAMILY HISTORY:  Nothing remarkable.   SOCIAL HISTORY:  The patient lives by herself in [place].  No recent  alcohol, drug, tobacco  use.  The patient is widowed.   ALLERGIES:  1. CIPRO.  2. CODEINE.  3. VALIUM.  4. VERSED.  5. SULFA.  6. The patient has received CT angiogram with contrast today without      any troubles, so I do not thing she has allergy to contrast media.   HOME MEDICATIONS:  1. Lorazepam 0.5 mg at night.  2. Atenolol 12.5 mg at night also.   REVIEW OF SYSTEMS:  Positive as per HPI, otherwise negative review of  systems done per 14 systems.   PHYSICAL EXAMINATION:  VITAL SIGNS:  Blood pressure 157/68, pulse 70,  respirations 18, temperature 98.2, oxygen saturation 97% on room air.  GENERAL:  Alert, oriented x3 lying in bed without any distress.  CARDIOVASCULAR:  S1 and S2 are regular.  No murmur, rub or gallop.  LUNGS:  Clear to auscultation bilaterally.  No wheezing, rhonchi,  crackles.  ABDOMEN:  Nontender, nondistended.  Bowel sounds are present.  Scar  present from previous surgery.  No organ enlargement.  EXTREMITIES:  No clubbing, cyanosis or  edema.  Pulses are palpable in  all 4 extremities.  NECK:  No thyromegaly or JVD.  SKIN:  No rash or bruits.  NEUROLOGIC:  Shows intact cranial nerves, muscular strength, sensation.  HEENT:  Head is normocephalic, nontraumatic.  Eyes:  Pupils are equally  reactive to light and accommodation.  Extraocular muscles intact.  Oral  cavity:  Oral mucous membranes are moist.  No thrush is noted.   DIAGNOSTICS:  1. EKG shows no acute ST/T changes.  2. Chest x-ray unremarkable except COPD, emphysema.   LABORATORY DATA:  Lipase 21.  Comprehensive metabolic panel unremarkable  except carbon dioxide 27, D-dimer 0.51.  First set of troponin negative.  CBC with differential normal.   ASSESSMENT:  1. Left shoulder pain, generalized weakness, diaphoresis, we will rule      out myocardial infarction.  2. Uncontrolled hypertension.  3. History of supraventricular tachycardia.  4. Mitral valve prolapse.  5. History of breast cancer status post  lumpectomy.  6. Gastroesophageal reflux disease.  7. Lumbar spondylosis.   PLAN:  We will admit the patient to telemetry bed under Dr. Theressa Millard with a diagnosis of left shoulder and hand pain, generalized  weakness, hypertension.  We will start cardiac diet.  We will check EKG  this morning.  We will get CBC with differential, CMP tomorrow morning.  We will start aspirin 81 mg p.o. daily; metoprolol 25 mg p.o. b.i.d.;  lisinopril 5 mg p.o. daily; Zocor 20 mg p.o. daily; Lovenox 1 mg/kg  subcutaneous q.12 h.; sublingual nitro 0.4 mg every 5 minutes for 3  doses p.r.n.; Nitropaste 1 inch q.6 h.; Tylenol 500 mg p.o. q.6 h.  p.r.n. and IV  normal saline at 50 cc/hour at this time as the patient feels dry also.  After ruling out MI with 3 sets of troponin, we can schedule for stress  test and further heart workup including echocardiogram.  I believe that  workup pending to be ordered at this time as we are ruling out MI.      Donalynn Furlong, MD  Electronically Signed     TVP/MEDQ  D:  04/10/2008  T:  04/10/2008  Job:  [id number]   cc:   Theressa Millard, M.D.  Fax: [phone]

## 2010-11-21 NOTE — Assessment & Plan Note (Signed)
OFFICE VISIT   [name], [name]  DOB:  April 30, 1926                                        Nov 29, 2008  CHART #:  [id number]   The patient returns for further followup and preoperative consultation  prior to surgery scheduled for next week.  She was originally seen in  consultation here in the office on Nov 17, 2008.  She called and  requested a followup visit today to discuss further issues regarding her  limited tolerance to a variety of medications as well as questions  regarding which lower extremity we have planned to use for approach for  peripheral cannulation for surgery.  She reminded me that she has had  problems with both atenolol and flecainide in terms of side effects.  She reminded me that she has had problems with nausea and confusion  following general anesthetics in the past.  She states that she has had  problems with her left lower extremity ever since back surgery several  years ago, and she requested that we consider using the right femoral  artery approach for surgery next week.  She did have heart  catheterization via the right common femoral artery approach, so I have  reminded her that use of the femoral artery may depend upon findings  related to her catheterization.  Nonetheless, we can certainly take a  look at the right femoral artery as a preferable approach as long as no  significant scar tissue or false aneurysm are encountered.  We again  reviewed the indications, risks, and potential benefits of surgery as  well as expectations for postoperative recovery.  All of her questions  have been addressed.  We will plan to proceed with mitral valve repair  or replacement and CryoMaze procedure using minimally invasive approach  next week.  CT angiogram of her thoracic and abdominal aorta performed  last week was reviewed.  No significant abnormal findings were noted and  in  particular, there was no significant atherosclerotic plaque  in the  descending thoracic aorta or iliac vessels which might preclude femoral  artery cannulation for surgery.   Salvatore Decent. Cornelius Moras, M.D.  Electronically Signed   CHO/MEDQ  D:  11/29/2008  T:  11/30/2008  Job:  [id number]   cc:   Lyn Records, M.D.  Theressa Millard, M.D.

## 2010-11-21 NOTE — Op Note (Signed)
[name], [name]                 ACCOUNT NO.:  [id number]   MEDICAL RECORD NO.:  [id number]          PATIENT TYPE:  INP   LOCATION:  2310                         FACILITY:  [facility]   PHYSICIAN:  Guadalupe Maple, M.D.  DATE OF BIRTH:  1926-06-12   DATE OF PROCEDURE:  12/14/2008  DATE OF DISCHARGE:                               OPERATIVE REPORT   PROCEDURE:  Intraoperative transesophageal echocardiography.   Ms. [name] is an 77 year old white female with a remote history of  rheumatic fever as a child who developed atrial fibrillation and was  subsequently found to have severe mitral regurgitation.  She is now  scheduled to undergo mitral valve repair or replacement.  Intraoperative  transesophageal echocardiography was requested to evaluate the mitral  valve to assist with the repair and to assess for any other valvular  pathology and to serve as a monitor for intraoperative volume status.   The patient was brought to the operating room at [facility] and  general anesthesia was induced without difficulty.  The trachea was  intubated and following orogastric suctioning, the transesophageal  echocardiography probe was inserted into the esophagus without  difficulty.   IMPRESSION:   PREBYPASS FINDINGS:  1. Aortic valve.  The aortic valve was trileaflet.  It appeared to      open normally.  There was a central area noted where jet of aortic      insufficiency originated, this was graded as 1+.  There was no      significant calcification of the leaflets noted.  2. Mitral valve.  There was thickening of the anterior and posterior      leaflet and both leaflets appeared somewhat redundant.  There was      severe mitral insufficiency, which was due to multiple jets of      mitral insufficiency.  The tumor was prominent, jets were noted as      a central jet, which appeared to originate in the A2, A3 region and      was associated with a cleft in the aortic leaflet.  There  was also      a jet of mitral insufficiency, which originated in the A1 region      and was due to a prolapsing segment of A1, which resulted in a jet      of mitral insufficiency that was directed posteriorly along the      superior surface of the posterior leaflet.  There were no flail      segments or torn chordae appreciated.  There was marked      calcification of the base of the posterior leaflet as well, but      both leaflets appeared mobile.  3. Left ventricle.  The left ventricular cavity was dilated.  Left      ventricular end-diastolic diameter measured 5.3 cm.  Left      ventricular end-systolic diameter measured 2.8 cm at the mid      papillary level.  Left ventricular wall thickness measured 1-1.15      cm and end diastole at  the mid papillary level.  Ejection fraction      was estimated as 65% with vigorous contractility in all segments      interrogated.  There was no thrombus noted in the left ventricular      apex.  4. Right ventricle.  The right ventricular cavity was triangular-      shaped and there was good vigorous contractility of the right      ventricular free wall.  The right ventricular cavity appeared      somewhat dilated.  There appeared to be normal right ventricular      function.  5. Tricuspid valve.  The tricuspid leaflets were intact.  There was a      jet of 1 to 2+ tricuspid insufficiency, which was central and      vegetations of 4 segments of the tricuspid valve noted.  6. Interatrial septum.  The interatrial septum was intact without      evidence of patent foramen ovale, atrial septal defect by color      Doppler and bubble study.  7. Left atrium.  The left atrial cavity was dilated.  There was no      thrombus noted in the left atrial or left atrial appendage.  8. Ascending aorta.  The ascending aorta showed some thickening of the      wall with mild calcification of the wall and no severe atheromatous      disease or mobile sacs noted.  9.  Descending aorta.  The descending aorta showed mild atheromatous      disease and measured 1.9 to 2 cm in diameter.   POSTBYPASS FINDINGS:  1. Aortic valve.  The aortic valve was unchanged from the prebypass      study.  The leaflets opened well and there was 1+ residual aortic      insufficiency.  2. Mitral valve.  There was an annuloplasty ring in the mitral      position.  Both leaflets were mobile.  There was no residual mitral      insufficiency appreciated.  This was confirmed on multiple views      and with three-dimensional views using color Doppler.  The      continuous wave interrogation of the mitral inflow revealed a mean      transmitral gradient of 4 mmHg and a mitral valve area of 4.0 cm      using the pressure half-time method.  3. Left ventricle.  The left ventricular function appeared unchanged      from the prebypass study.  There was vigorous contractility in all      segments interrogated.  Ejection fraction estimated 65-70%.  4. Right ventricle.  The right ventricular function again appeared      normal with good contractility of the right ventricular free wall.  5. Tricuspid valve.  There was residual 1 to 2+ tricuspid      insufficiency, which was unchanged from the prebypass study.           ______________________________  Guadalupe Maple, M.D.     DCJ/MEDQ  D:  12/14/2008  T:  12/15/2008  Job:  [id number]

## 2010-11-21 NOTE — Consult Note (Signed)
NAMENADALYN, DERINGER NO.:  [id number]   MEDICAL RECORD NO.:  [id number]          PATIENT TYPE:  OIB   LOCATION:  2899                         FACILITY:  [facility]   PHYSICIAN:  Lyn Records, M.D.   DATE OF BIRTH:  [birth year]   DATE OF CONSULTATION:  DATE OF DISCHARGE:  05/27/2008                                 CONSULTATION   An elective electrical cardioversion note.   INDICATION:  Atrial fibrillation with fatigue and loss of energy.   PROCEDURE PERFORMED:  Elective electrical cardioversion.   DESCRIPTION:  The patient had biphasic electrode pads placed anteriorly  and posteriorly.  She received 100 mg of IV propofol under the direction  of Dr. Arta Bruce.  Once asleep, she received 1 discharge of 200  joules/watt seconds converting her to normal sinus rhythm with PACs and  runs of ectopic atrial tachycardia.  She awakened without any evidence  of neurological sequelae.   CONCLUSION:  Successful conversion to sinus rhythm with PACs.   PLAN:  1. Increase flecainide to 50 mg b.i.d.  Decrease beta blocker to 25 mg      per day.  2. EKG and office visit in [redacted] week along with an INR.      Lyn Records, M.D.  Electronically Signed     HWS/MEDQ  D:  05/27/2008  T:  05/27/2008  Job:  [id number]

## 2010-11-21 NOTE — H&P (Signed)
HISTORY AND PHYSICAL EXAMINATION   Nov 17, 2008   Re:  [name], [name]           DOB:  [birth year]   DATE OF PLANNED HOSPITAL ADMISSION AND SURGERY:  December 07, 2008.   PRESENTING CHIEF COMPLAINT:  Exertional shortness of breath.   HISTORY OF PRESENT ILLNESS:  The patient is an 77 year old widowed white  female from [place], [place], followed by Dr. Verdis Prime and  Dr. Theressa Millard with long history of mitral valve prolapse and mitral  regurgitation.  The patient states that she was first noted to have a  heart murmur during childhood.  She was thought to have had rheumatic  fever.  She has lived a long healthy life without significant problems  until recently.  In October 2009, she presented with atrial fibrillation  associated with congestive heart failure.  She underwent DC  cardioversion and maintain sinus rhythm for several months, although she  continued to have significant exertional shortness of breath and  fatigue.  She went back in atrial fibrillation, and another  cardioversion was performed in March 2010.  Despite this the patient had  significant orthopnea and exertional shortness of breath.  She  subsequently underwent transesophageal echocardiogram on October 26, 2008,  by Dr. Eldridge Dace.  She was found to have bileaflet prolapse with severe  (4+) mitral regurgitation.  She was seen in followup by Dr. Katrinka Blazing, an  elective left and right heart catheterization was performed on Nov 11, 2008.  This revealed the presence of normal coronary artery anatomy with  no significant coronary artery disease.  There was severe mitral  regurgitation with mild pulmonary hypertension.  Left ventricular  systolic function appeared normal.  The patient has now been referred  for possible elective surgical intervention.   REVIEW OF SYSTEMS:  General:  The patient complains of exertional  fatigue over the last 8 months or so.  Despite her age, she has remained  remarkably active physically and healthy all of her life.  She remains  completely functionally independent.  She lives alone, drives an  automobile, care for her own house, does her own shopping and continues  to remain physically active.  Cardiac:  The patient denies any  significant chest pain, although she does report a mild sensation of  pressure across her chest over the last several months.  This does not  seem to wax and wane, and is relatively constant.  The patient complains  of exertional shortness of breath that seems to wax and wane in  severity, but at times can be fairly severe.  She denies resting  shortness of breath, PND, palpitations, or syncope.  She has had  bilateral lower extremity edema.  Respiratory:  Negative.  The patient  denies productive cough, hemoptysis, or wheezing.  Gastrointestinal:  The patient reports normal swallowing.  She has no difficulty swallowing  and she reports no symptoms of reflux.  She states that she does have a  sensitive stomach, and she has had problems with constipation ever since  she has been treated with flecainide.  She denies hematochezia,  hematemesis, or melena.  Genitourinary:  Notable for some stress urinary  incontinence with secondary need to urinate frequently.  The patient  denies urgency.  The patient has history of cystitis in the past, but  this has not been problematic recently.  Peripheral vascular:  Negative.  The patient denies symptoms suggestive of claudication.  Neurologic:  Negative.  The patient denies  transient monocular blindness, transient  numbness, or weakness involving either upper or lower extremity.  Musculoskeletal:  Notable for some degenerative arthritis particularly  afflicting her lower back.  This does not seem to slow down too much.  Psychiatric:  Negative.  HEENT:  Notable for some recent change in her  eyesight, but also coincides with flecainide therapy.  Hematologic:  Notable for some easy  bruising since she has been on Coumadin.  She  reports that she has not had difficulty maintaining therapeutic Coumadin  levels.   PAST MEDICAL HISTORY:  1. Mitral valve prolapse with mitral regurgitation.  2. GE reflux disease.  3. Breast cancer, status post lumpectomy with radiation therapy.  4. Lumbosacral degenerative disk disease.  5. Stress urinary incontinence.  6. Persistent atrial fibrillation.  7. Osteopenia.  8. Recurrent urinary tract infections and cystitis.  9. History of segmental colitis.  10.Possible renal cell cancer treated with ablation therapy.   PAST SURGICAL HISTORY:  1. Appendectomy 1941.  2. Tonsillectomy 1945.  3. Cesarean section x3 1951, 1954, and 1961.  4. Abdominal hysterectomy 1968.  5. Breast biopsies x3.  6. 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  The      patient underwent radiation therapy postoperatively.  7. Right knee arthroscopy 2000.  8. Cataract extraction, right eye 2001.  9. Removal of synovial cyst from the lumbar spine 2006.  10.Cataract extraction, left eye 2007.  11.RF ablation of tumor, left kidney 2008.   FAMILY HISTORY:  Noncontributory.   SOCIAL HISTORY:  The patient has been widowed for the past 6 years.  She  lives alone here in [place].  She has 3 grown children and numerous  grandchildren.  Two of her children live locally here in [place] and  are very supportive.  The patient is a nonsmoker.  She denies alcohol  consumption.  She remains fairly active physically and is functionally  independent.   CURRENT MEDICATIONS:  1. Probiotic 1 billion units daily.  2. Warfarin 5 mg daily with 7.5 mg every Wednesday.  3. Atenolol 25 mg daily.  4. Flecainide 50 mg every morning and 100 mg every evening.  5. Furosemide 20 mg daily.  6. Vitamin D 1000 units daily.  7. Vitamin C 500 mg daily.  8. Calcium 3000 mg daily.  9. Omega Q Plus daily.  10.Glucosamine sulfate 1 tablet daily.   DRUG ALLERGIES:   Sulfa and ciprofloxacin.   DRUG SENSITIVITIES:  Versed and codeine causes nausea and vomiting.  Valium causes headaches.   PHYSICAL EXAMINATION:  GENERAL:  The patient is a well-appearing female,  who appears somewhat younger than stated age and is in no acute  distress.  VITAL SIGNS:  Blood pressure is 126/60, pulse 70 and regular, and oxygen  saturation 97% on room air.  HEENT:  Unrevealing.  NECK:  Supple.  There is no cervical or supraclavicular lymphadenopathy.  There is no jugular venous distention.  No carotid bruits are noted.  LUNGS:  Auscultation of the chest demonstrates clear breath sounds which  are symmetrical bilaterally.  No wheezes or rhonchi are demonstrated.  CARDIOVASCULAR:  Regular rate and rhythm.  There is a grade 4/6  holosystolic murmur heard at the apex with radiation all across the  precordium to the axilla and back.  No diastolic murmurs are noted.  ABDOMEN:  Soft, mildly obese and nontender.  The liver edge is not  palpable.  There is no ascites.  Bowel sounds are present.  EXTREMITIES:  Warm and well perfused.  There is moderate bilateral lower  extremity edema.  Femoral pulses are palpable bilaterally.  The patient  had heart catheterization via the right femoral artery approach.  Distal  pulses are thready and barely palpable in the dorsalis pedis position.  There is sign of mild varicose veins in both lower legs.  There are no  open skin lesions or ulcerations.  RECTAL/GU:  Deferred.  NEUROLOGIC:  Grossly nonfocal and symmetrical throughout.   DIAGNOSTIC TESTS:  Transesophageal echocardiogram performed on October 26, 2008, by Dr. Eldridge Dace is reviewed.  This demonstrates severe (4+) mitral  regurgitation.  The patient has bileaflet prolapse of the mitral valve.  There may be a small flail segment of the posterior leaflet.  There does  appear to be significant mitral annular calcification posteriorly.  Left  ventricular systolic function appears normal.   There is some thickening  of the mitral valve as well and the possibility of underlying rheumatic  heart disease cannot be ruled out.  There is a broad jet of central  mitral regurgitation with another eccentric jet of regurgitation  extending anteriorly.  There is no left atrial thrombus.  There is mild-  to-moderate tricuspid regurgitation.  The aortic valve appears normal  and there is trivial aortic regurgitation.   Left and right heart catheterization performed by Dr. Katrinka Blazing on Nov 11, 2008, is reviewed.  This demonstrates normal coronary artery anatomy  with no significant coronary artery disease.  There is severe mitral  regurgitation.  There is normal left ventricular systolic function.  PA  pressure is measured at 34/11 with pulmonary capillary wedge pressure of  16 and a large V-wave.  Central venous pressure was 3.   IMPRESSION:  Mitral valve prolapse with severe (4+) mitral regurgitation  and progressive symptoms of exertional shortness of breath and fatigue.  The patient also has persistent atrial fibrillation for which she has  undergone direct current cardioversion on two occasions.  The patient  does have significant mitral annular calcification in the posterior  mitral annulus, and the patient also has reported a history of rheumatic  fever during childhood.  For these reasons, there is some increased risk  that mitral valve repair may not be feasible and mitral valve  replacement may be necessary.  However, on review of transesophageal  echocardiogram, I still feel there is a significant likelihood that  successful valve repair may be feasible.  With history of atrial  fibrillation, we would also plan concomitant maze procedure.  I suspect  the patient may be a relatively good candidate for minimally invasive  approach.   PLAN:  I have discussed matters at length with the patient and her son  here in the office today.  Alternative treatment strategies have been   discussed.  The rationale for proceeding with surgery has been  discussed.  The relative risks and benefits of minimally invasive  approach have been reviewed in detail.  They understand and accept all  potential associated risks of surgery including but not limited to risk  of death, stroke, myocardial infarction, congestive heart failure,  respiratory failure, renal failure, pneumonia, bleeding requiring blood  transfusion, arrhythmia, heart block or bradycardia requiring permanent  pacemaker, recurrent or persistent pleural effusion, possible injury to  artery or vein to her leg, possible injury to the phrenic nerve,  possible late recurrence of valvular heart disease, congestive heart  failure, or atrial arrhythmias.  They understand that we will plan to  attempt  mitral valve repair feasible, but because of the presence of  significant mitral annular calcification, she may be at somewhat  increased risk for need for valve replacement.  Under those  circumstances, we have planned to replace her valve using a  bioprosthetic tissue valve.  All of her questions have been addressed.  We will obtain CT angiogram of the thoracic and abdominal aorta and  iliac vessels to evaluate the possibility of significant peripheral  vascular disease that might preclude, say femoral artery cannulation for  surgery.  We tentatively plan to proceed with surgery on Tuesday, December 07, 2008.  The patient will stop taking Coumadin after her last dose on  Tuesday, Nov 30, 2008, in anticipation of surgery.   Salvatore Decent. Cornelius Moras, M.D.  Electronically Signed   CHO/MEDQ  D:  11/17/2008  T:  11/18/2008  Job:  [id number]   cc:   Lyn Records, M.D.  Theressa Millard, M.D.

## 2010-11-21 NOTE — Assessment & Plan Note (Signed)
OFFICE VISIT   ALPHA, CHOUINARD  DOB:  1926/03/17                                        February 07, 2009  CHART #:  [id number]   HISTORY OF PRESENT ILLNESS:  The patient returns for further followup  status post right miniature thoracotomy for mitral valve repair and Cox  crown maze procedure on December 14, 2008.  She was last seen here on the  office on December 31, 2008.  Since then, the patient has done reasonably  well, although she has had problems with recurrent paroxysmal atrial  fibrillation and intolerance of medications prescribed to treat her  atrial arrhythmias.  She had remain on amiodarone for an extended period  of time, and associated with this, she had severe nausea and anorexia.  Ultimately, amiodarone was stopped and her appetite is improved.  She  also had some problems with hypotension, and this has gradually improved  as her medications have been cut back.  She states that she is feeling  better, but she still nowhere near back to baseline.  She has had  minimal pain.  She denies shortness of breath.  Her energy level has  been slow to improve.   CURRENT MEDICATIONS:  1. Atenolol 25 mg twice daily.  2. Coumadin 5 mg daily.  3. Lisinopril 10 mg daily.  4. Aspirin 81 mg daily.   PHYSICAL EXAMINATION:  General:  Well-appearing female.  Vital Signs:  Blood pressure 110/68, pulse is 73 and irregularly irregular.  Two-  channel telemetry rhythm strip demonstrates what may be atrial flutter  with variable block.  Formal 12-lead electrocardiogram is not available  in our office to better evaluate.  Oxygen saturation is 99% on room air.  Chest:  A miniature thoracotomy incision that is healing nicely.  Breath  sounds are clear to auscultation and symmetrical.  No wheezes or rhonchi  are noted.  Cardiovascular:  Irregular heart rhythm.  No murmurs, rubs,  or gallops noted.  Abdomen:  Soft, nontender.  The right groin incision  is healing nicely.   Extremities:  Warm and well perfused.  There is mild  bilateral lower extremity edema.  The patient states that the lower  extremity edema has just increased recently, since she stopped taking  Lasix.   IMPRESSION:  Satisfactory progress following right miniature thoracotomy  for mitral valve repair and Cox crown maze procedure.  The patient has  had difficulty with postoperative atrial fibrillation and atrial  flutter, and medications had been difficult for her to tolerate.  She  seems to be turning the corner and getting better.  She may need to go  back on diuretic.  She is scheduled to see Dr. Katrinka Blazing later this week.  Overall, she seems to be progressing acceptably well.   PLAN:  We will leave subsequent decision making regarding further  changes in her medications to Dr. Katrinka Blazing and colleagues' discretion.  I  have encouraged the patient to continue to increase her physical  activity as tolerated.  At this point, she really does not have any  particular physical restrictions with regard to her recent surgery other  than those that are dictated by her irregular heart rhythms.  I have  encouraged her to try to increase her activity as she sees that.  We  will plan to see her back in  4 months for further followup.  We would  like to see a followup echocardiogram at some point in the future as  well.  All of her questions have been addressed.   Salvatore Decent. Cornelius Moras, M.D.  Electronically Signed   CHO/MEDQ  D:  02/07/2009  T:  02/08/2009  Job:  [id number]   cc:   Lyn Records, M.D.  Hillis Range, MD  Theressa Millard, M.D.

## 2010-11-21 NOTE — Discharge Summary (Signed)
NAMECLOVA, MORLOCK NO.:  [id number]   MEDICAL RECORD NO.:  [id number]          PATIENT TYPE:  INP   LOCATION:  3735                         FACILITY:  [facility]   PHYSICIAN:  Lyn Records, M.D.   DATE OF BIRTH:  Apr 06, 1926   DATE OF ADMISSION:  05/05/2008  DATE OF DISCHARGE:  05/07/2008                               DISCHARGE SUMMARY   DISCHARGE DIAGNOSES:  1. Persistent atrial fibrillation with controlled ventricular      response.  2. Long-term anticoagulation therapy.  3. Hypertension.  4. Mitral valve prolapse.  5. Breast cancer status post lumpectomy.  6. Gastroesophageal reflux disease.  7. Status post appendectomy, hysterectomy, and C-section  8. Lumbar spondylosis.  9. Allergies to CIPRO, CODEINE, VALIUM, VERSED, and SULFA.  10.Long-term medication use.   HOSPITAL COURSE:  Ms. [name] is an 77 year old female who has recently  been diagnosed with atrial fibrillation and placed on Coumadin.  She  presented to the emergency room complaining of a 2-hour history of heart  racing associated with shortness of breath, diaphoresis, and dizziness.  Upon arrival to emergency room, she was in AFib with a ventricular  response of 103 beats per minute.  She states she had intermittently  felt her heart racing over the past week.   While in the hospital, her atenolol was changed to 25 mg p.o. b.i.d. and  she was started on flecainide 50 mg p.o. b.i.d.   She remained in atrial fibrillation upon discharge and hopefully, we can  leave her on the flecainide and plan for a cardioversion in the near  future.   Her INR on discharge is 2.3.   She is to follow up in [facility] on May 10, 2008, at 3:30  p.m.  Follow up with Dr. Katrinka Blazing on May 13, 2008, at 2:15 p.m.   DISCHARGE MEDICATIONS:  1. Flecainide 50 mg twice a day.  2. Coumadin 5 mg a day.  3. Atenolol 25 mg twice a day.   She is to remain on a low-sodium, heart-healthy diet.  Increase  activity  slowly.  Call for any further questions or concerns.      Guy Franco, P.A.      Lyn Records, M.D.  Electronically Signed    LB/MEDQ  D:  05/07/2008  T:  05/07/2008  Job:  [id number]

## 2010-11-21 NOTE — Discharge Summary (Signed)
[name], [name]                 ACCOUNT NO.:  [id number]   MEDICAL RECORD NO.:  [id number]          PATIENT TYPE:  INP   LOCATION:  2008                         FACILITY:  [facility]   PHYSICIAN:  Salvatore Decent. Cornelius Moras, M.D. DATE OF BIRTH:  [birth year]   DATE OF ADMISSION:  12/14/2008  DATE OF DISCHARGE:  12/19/2008                               DISCHARGE SUMMARY   ADMISSION DIAGNOSES:  1. Mitral valve prolapse with severe mitral regurgitation.  2. History of atrial fibrillation (status post direct current      cardioversion).  3. History of gastroesophageal reflux disease.  4. History of breast cancer (status post lumpectomy with radiation      therapy).  5. History of stress urinary incontinence.  6. History of osteopenia.  7. History of lumbosacral degenerative disk disease.   DISCHARGE DIAGNOSES:  1. Mitral valve prolapse with severe mitral regurgitation.  2. History of atrial fibrillation (status post direct current      cardioversion).  3. History of gastroesophageal reflux disease.  4. History of breast cancer (status post lumpectomy with radiation      therapy).  5. History of stress urinary incontinence.  6. History of osteopenia.  7. History of lumbosacral degenerative disk disease.   PROCEDURE:  Right miniature thoracotomy for mitral valve repair  (plication of posterior leaflet commissures x2, anteriolateral  commissural plasty x1, a 34 mm Sorin Memo 3-D ring annuloplasty), on  parentheses, and left-sided Cox cryomaps by Dr. Durward Mallard on December 14, 2008.   HISTORY OF PRESENT ILLNESS:  This is an 77 year old Caucasian female  with a long history of mitral valve prolapse and mitral regurgitation.  It was thought that she had rheumatic fever as a child.  In October  2009, she presented with atrial fibrillation and congestive heart  failure.  She underwent DC cardioversion and was able to maintain sinus  rhythm for several months; however, she did have complaints of  significant exertional shortness of breath, as well as fatigue.  She  then went back into atrial fibrillation and had another cardioversion  performed in March 2010.  The patient continued to have significant  exertional shortness of breath, as well as orthopnea.  She underwent a  transesophageal echocardiogram on 10/26/2008, with Dr. Corky Crafts.  She was found to bi-leaflet prolapse with 4+ severe mitral  regurgitation.  She then underwent elective cardiac catheterization with  Dr. Verdis Prime on Nov 11, 2008.  Results showed no significant coronary  artery disease, severe MR with mild pulmonary hypertension and left  ventricular function of 60%.  The patient was then seen in consultation  in the office by Dr. Cornelius Moras on Nov 17, 2008.  The patient was again seen  on Nov 29, 2008, where all her questions were answered and she agreed to  proceed with surgery.  She then underwent the right mini-thoracotomy for  mitral valve repair and Cox cryomaps procedure by Dr. Cornelius Moras on December 14, 2008.   HOSPITAL COURSE:  The patient was extubated early the morning of  postoperative day one, without difficulty.  She  initially was AI-paced.  She did have hypertension and medications were adjusted accordingly.  Swan and A-line, as well as mediastinal chest tubes were removed on  postop day number one.  The pleural tube did remain for a couple of  days, however.  The patient was weaned off drips as tolerated.  She was  volume- overloaded and diuresed accordingly.  She was found to have  postoperative acute blood loss anemia.  She did not require any  postoperative transfusions and gradually her hematocrit continued to  increase.  She also was found to have postoperative thrombocytopenia.  The platelet count went as low as 92,000.  The last platelet count was  to be increased up to 102,000.  The patient was then transferred from  Intensive Care Unit to [facility] for further convalescence.  Early the   morning of December 17, 2008, the patient was on the bedside commode.  She  apparently reached for a trash can and fell.  After the fall, the  patient complained of pain on the left side of the head, and was found  to have a bump on the left side of the head, as well as bruising.  The  patient did not have any other complaints.  A CT of the head was done  which showed no acute cranial abnormalities.  Vital signs were checked  every 15 minutes, and because the patient's O2 saturation was 84% on  room air, she was placed on 2 liters nasal cannula, with O2 saturation  in the low 90s.  Blood pressure medications continued to be adjusted, to  control hypertension.  The patient had been started on Coumadin and PT  and INR were monitored daily.  She continued to progress with CRPI.   DISCHARGE PHYSICAL EXAMINATION:  VITAL SIGNS:  Currently the patient is  afebrile, with a heart rate in the 70s, BP 117/61, O2 saturation 91% and  96% on room air.  Preop weight was 59 kg, today's weight 64.3 kg.  CARDIOVASCULAR:  Rate and rhythm.  No murmur on tele, with primary AV  block.  PULMONARY:  Clear to auscultation bilaterally.  No rales,  wheezes or rhonchi.  ABDOMEN:  Soft, nontender.  Bowel sounds present.  EXTREMITIES:  Mild lower extremity edema.  SKIN:  The right thoracotomy wound is clean and dry.   DISPOSITION:  The patient is are been tolerating a diet and has had  multiple bowel movements.  If the patient remains afebrile and  hemodynamically stable, she will be discharged to [facility] on December 20, 2008.   LATEST LABORATORY DATA:  BMET done December 19, 2008:  Potassium 4.2, BUN 10  and creatinine 0.6 respectively.  Last PT and INR done 12/18/2008,  15.4  and 1.2 respectively.  Last CBC done 12/17/2008, revealed a white count  to be 15,000, H&H 11 and 31.6 respectively, platelet count 102,000.   Last chest x-ray done on December 18, 2008, revealed small bilateral pleural  effusions and atelectasis,  right greater than left, with stable  cardiomegaly.   DISCHARGE INSTRUCTIONS:  1. The patient is not to drive or lift more than 10 pounds.  2. She is to continue with her breathing exercise daily.  3. She is to walk every day and increase her routine duration as      tolerates.  4. She is to remain on a low-fat, low-salt diet.  5. She may shower.  6. She is to cleanse her wounds with mild soap and water and  call the      office if any wound problems arise.   FOLLOWUP:  1. PT and INR need to be obtained at the facility on December 22, 2008,      with the results need to be faxed to Dr. Michaelle Copas office.  2. The patient needs to contact Dr. Michaelle Copas office for a follow-up      appointment in two weeks.  3. The patient has an appointment to see Dr. Orvan July physician      assistant on January 03, 2009, at 2:00 p.m., and prior to this office      appointment a chest x-ray will be obtained.   DISCHARGE MEDICATIONS:  1. Probiotic (q.4h., one cap p.o. daily.  2. Vitamin D 1000 units daily.  3. Vitamin C 500 mg p.o. daily.  4. Calcium 3000 mg p.o. daily.  5. Omega-Q plus p.o. daily.  6. Glucosamine sulfate, one tablet p.o. daily.  7. Enteric-coated aspirin 81 mg p.o. daily.  8. Coumadin 2.5 mg p.o. daily, or as directed.  9. Flecainide 50 mg p.o. daily.  10.Lisinopril 40 mg p.o. daily.  11.Atenolol 25 mg p.o. daily.  12.Amlodipine 2.5 mg p.o. daily.  13.Ultram 50 mg, one to two tablets every 4-6h. as needed for pain.  14.Lasix 40 mg p.o. daily.  15.KCl 20 mEq p.o. daily.  The duration will be determined at time of      discharge.      Doree Fudge, PA      [place] H. Cornelius Moras, M.D.  Electronically Signed    DZ/MEDQ  D:  12/19/2008  T:  12/19/2008  Job:  [id number]

## 2010-11-24 NOTE — Consult Note (Signed)
NAMESTASHIA, [name] NO.:  [id number]   MEDICAL RECORD NO.:  [id number]          PATIENT TYPE:  OBV   LOCATION:  4734                         FACILITY:  [facility]   PHYSICIAN:  Lyn Records, M.D.   DATE OF BIRTH:  October 14, 1925   DATE OF CONSULTATION:  05/15/2005  DATE OF DISCHARGE:                                   CONSULTATION   REFERRING PHYSICIAN:  Theressa Millard, M.D.   INDICATION:  Fatigue.   CONCLUSIONS:  Fatigue with intermittent left arm discomfort and relatively  low blood pressure with 2 specific episodes over the past 5 days.  Rule out  neurally mediated mechanism.  Rule out myocardial ischemia-related.  Rule  out other.  Doubt pulmonary emboli and significant carotid arrhythmias in  this situation, although cannot be absolutely certain.   RECOMMENDATION:  1.  Monitor to rule out arrhythmia.  2.  Serial enzymes to rule out myocardial infarction.  3.  Stress Cardiolite to attempt to reproduce symptoms.  The patient has      especially complained of exertional fatigue over the past several months      and perhaps this is in a sense related to these are acute episodes that      she has had recently and could represent an atypical manifestation of      myocardial ischemia.   COMMENTS:  The patient is 53 and has a history of SVT, mitral valve prolapse  and hypercholesterolemia.  There is no history of ischemic heart disease.  She was admitted to the hospital by Dr. Earl Gala on May 15, 2005 after 2  distinct episodes within the 3 days prior to admission of extreme fatigue,  clammy feeling and relative hypotension.  No specific chest discomfort  associated with these episodes.  Prior to these episodes, she has not had  similar complaints, although since back surgery last February, she has noted  exertional weakness and fatigue that she had never experienced before.  This  is not associated with chest pain.   Her significant medical problems include  mitral valve prolapse, history of  PSVT.   MEDICATIONS:  Medications are typically:  1.  Fosamax.  2.  Atenolol 25 mg per day.  3.  Lorazepam p.r.n.  4.  Vitamin D.   FAMILY HISTORY:  Father died of myocardial infarction.   SOCIAL HISTORY:  She is widowed.  Her husband was a former patient.   PHYSICAL EXAMINATION:  GENERAL:  The patient is in no acute distress.  VITAL SIGNS:  Her blood pressure is 140/70.  The heart rate is 72.  SKIN:  Clear.  HEENT:  Exam is unremarkable.  NECK:  No JVD, carotid bruits or thyromegaly.  No carotid bruits are heard.  CARDIAC:  There is a 2/6 systolic murmur heard best at the left lower  sternal border and at the apex, radiates into the left axilla, but also can  be heard at the base of the heart.  No clicks are heard.  ABDOMEN:  Soft.  No ascites.  EXTREMITIES:  No edema.  NEUROLOGICAL:  Exam is  grossly intact.   LABORATORY AND ACCESSORY CLINICAL DATA:  EKG reveals nonspecific ST-T wave  abnormality.   Chest x-ray reveals mild cardiomegaly.   Laboratory data reveal normal cardiac markers, normal BUN and creatinine,  normal hemoglobin.   DISCUSSION:  The patient's symptoms are somewhat vague.  The documentation  of hypotension along with the patient's symptoms raises the possibility of a  neurally mediated mechanism.  Myocardial ischemia is a consideration,  although there is no definite objective evidence.  We will do a Cardiolite  study to rule this out, monitor for arrhythmias, and then depending upon  data base, perhaps do other evaluation or simply follow.      Lyn Records, M.D.  Electronically Signed     HWS/MEDQ  D:  05/16/2005  T:  05/16/2005  Job:  [id number]   cc:   Theressa Millard, M.D.  Fax: [phone]

## 2010-11-24 NOTE — Op Note (Signed)
NAMEGRESIA, [name]                 ACCOUNT NO.:  [id number]   MEDICAL RECORD NO.:  [id number]          PATIENT TYPE:  OIB   LOCATION:  2899                         FACILITY:  [facility]   PHYSICIAN:  Armanda Magic, M.D.     DATE OF BIRTH:  1925/10/28   DATE OF PROCEDURE:  07/20/2005  DATE OF DISCHARGE:  07/20/2005                                 OPERATIVE REPORT   PROCEDURE:  Tilt table test.   INDICATIONS FOR PROCEDURE:  This is a 77 year old very pleasant white female  who has been having episodes of dizzy spells, not complete syncope but near-  syncope.  She says that some of these episodes are associated with  palpitations but that the palpitations come after she has the onset of dizzy  spells.  She now presents for tilt table testing.   DESCRIPTION OF PROCEDURE:  The patient was brought to the cardiac  catheterization laboratory in the fasting nonsedated state.  Informed  consent was obtained.  The patient was connected to continuous heart rate  and pulse oximetry monitoring and intermittent blood pressure monitoring.  The patient was placed supine for five minutes while baseline blood pressure  was measured which was approximately 130/78 mmHg with 79 beats per minute  heart rate.  The patient was tilted upright to 70 degrees for a total of 45  minutes.  There was no significant drop in her blood pressures throughout  the tilt table test, and the lowest blood pressure documented was a systolic  of 130/61, which was noted at the immediate upright tilt.  Heart rates  averaged in the 70s to 80s throughout the entire tilt.  The patient was  completely asymptomatic from a dizziness standpoint, with no evidence of  syncope or hypotension, but she did have several episodes of palpitations,  and we were able to capture some rhythm strips showing what appeared to be  an SVT at 140 beats per minute.  There was no discernable P wave noted that  appeared to be hidden in the T wave, and she says  that she has been having  episodic palpitations like this and does have a history of SVT in the past.  At the end of the tilt, the patient was placed back supine and subsequently  discharged to home, with follow up with Dr. Katrinka Blazing.   IMPRESSION:  1.  Negative tilt table test for syncope.  2.  History of frequent dizzy spells and near-syncope.  3.  History of supraventricular tachycardia.  4.  History of recent palpitations with supraventricular tachycardia      documented at the time of the tilt table test.   PLAN:  I am going to refer the patient back to Dr. Katrinka Blazing.  She will see him  back next week for further evaluation of SVT and of dizzy spells.  Of note,  the patient states that she does not think that her palpitations come before  the dizzy spells, that she seems to have her heart race after the dizzy  spells start, so it is unclear as to whether the SVT is  actually the cause  of her symptoms.  I will further evaluation and treatment up to Dr. Katrinka Blazing.      Armanda Magic, M.D.  Electronically Signed     TT/MEDQ  D:  07/20/2005  T:  07/22/2005  Job:  [id number]   cc:   Lyn Records, M.D.  Fax: [phone]

## 2010-11-24 NOTE — Discharge Summary (Signed)
[name], KOEHNE NO.:  [id number]   MEDICAL RECORD NO.:  [id number]          PATIENT TYPE:  OBV   LOCATION:  4734                         FACILITY:  [facility]   PHYSICIAN:  Theressa Millard, M.D.    DATE OF BIRTH:  Dec 14, 1925   DATE OF ADMISSION:  05/15/2005  DATE OF DISCHARGE:  05/16/2005                                 DISCHARGE SUMMARY   ADMISSION DIAGNOSIS:  Episodes of profound fatigue, possible unstable  angina.   DISCHARGE DIAGNOSIS:  1.  Fatigue of questionable etiology.  2.  No evidence of ischemia on non-invasive testing.  3.  Hypercholesterolemia.  4.  History of supraventricular tachycardia and mitral valve prolapse.  5.  History of breast cancer.  6.  Gastroesophageal reflux disease.   The patient is a 77 year old white female who has been having exertional  fatigue but in the days prior to admission developed spells of profound  fatigue at rest.  No clear etiology was noted in the office and because of  the exertional nature of these symptoms, it was felt they could possibly be  due to unstable angina, so the patient was admitted for further evaluation.   HOSPITAL COURSE:  The patient was admitted on the basis of serial EKGs and  enzymes and myocardial infarction was ruled out.  She was seen in  consultation by cardiology who agreed that a noninvasive workup was  appropriate and she underwent a stress Cardiolite which was negative.  Therefore, she was discharged in improved condition.  Although the cause of  the fatigue is unknown, it is fairly certain that these are not related to  ischemia.   DISCHARGE MEDICATIONS:  Fosamax 70 mg weekly, Atenolol 25 mg, Tylenol  p.r.n., Probiotic daily, multi-vitamin daily.   DISCHARGE INSTRUCTIONS:  Activities no restriction.  Diet no restriction.      Theressa Millard, M.D.  Electronically Signed     JO/MEDQ  D:  09/12/2005  T:  09/12/2005  Job:  [id number]

## 2010-11-24 NOTE — Op Note (Signed)
NAMEARELLA, BLINDER NO.:  [id number]   MEDICAL RECORD NO.:  [id number]          PATIENT TYPE:  INP   LOCATION:  2899                         FACILITY:  [facility]   PHYSICIAN:  Hewitt Shorts, M.D.DATE OF BIRTH:  06-28-1926   DATE OF PROCEDURE:  08/30/2004  DATE OF DISCHARGE:                                 OPERATIVE REPORT   PREOPERATIVE DIAGNOSES:  1.  Bilateral L4-5 synovial cyst and facet arthropathy.  2.  Left L4-5 lateral recess stenosis.  3.  Lumbar spondylosis.  4.  Lumbar radiculopathy.   POSTOPERATIVE DIAGNOSES:  1.  Bilateral L4-5 synovial cyst and facet arthropathy.  2.  Left L4-5 lateral recess stenosis.  3.  Lumbar spondylosis.  4.  Lumbar radiculopathy.   PROCEDURES:  Left L4-5 lumbar laminotomy and resection of synovial cyst with  microdissection.   SURGEON:  Hewitt Shorts, M.D.   ASSISTANT:  Coletta Memos, M.D.   ANESTHESIA:  General endotracheal.   INDICATIONS:  The patient is a 77 year old woman who presented with left  lumbar radiculopathy.  She was found to have advanced bilateral L4-5 facet  arthropathy associated with a grade 1 anterolisthesis at L4-5 with synovial  cyst and left L4-5 bilateral recess stenosis.  The decision was made to  proceed with decompression on the left side.   DESCRIPTION OF PROCEDURE:  The patient was brought to the operating room and  placed under general endotracheal anesthesia.  The patient was returned to a  prone position.  The lumbar region was prepped with Betadine soap and  solution and draped in a sterile fashion.  The midline was infiltrated with  local anesthetic with epinephrine.  X-ray was taken and the L4-5 level  identified.  A midline incision was made and carried down to the  subcutaneous tissues with bipolar cautery.  Electrocautery was used to  maintain hemostasis.  Dissection was carried down to the lumbar fascia which  was incised in the left side of midline.  The paraspinous  muscles were then  resected from the spinous process and lamina in a subperiosteal fashion.  The L4-5 interlaminar space was identified.  X-rays were taken and confirmed  the localization.  Then the microscope was draped and brought into the field  to provide additional magnification, illumination and visualization.  The  remainder of the decompression was performed using microdissection and  microsurgical technique.  A laminotomy was performed using the X Max drill  and Kerrison punches.  The ligamentum flavum was markedly thickened.  We  carefully removed this superiorly.  We encountered adherent cyst.  It was  carefully dissected from the dura and the thickened ligamentum flavum along  with the synovial cyst were removed.  We were able to decompress the thecal  sac and exiting left L5 nerve root.  Once the decompression was completed  and hemostasis was established, we instilled 2 mL of fentanyl and 80 mg of  Depo-Medrol into the epidural space and proceeded with closure.  The deep  fascia was closed with interrupted undyed 1-0 Vicryl sutures.  The  subcutaneous and subcuticular were  closed with inverted 2-0 undyed sutures.  The skin was  approximated with Dermabond.  The patient tolerated the procedure well.  The  estimated blood loss was 25 mL.  The sponge and needle count were correct.  Following surgery, the patient was to be turned back to the supine position,  reversed from anesthetic, extubated and transferred to the recovery room for  further care.      RWN/MEDQ  D:  08/30/2004  T:  08/30/2004  Job:  [id number]

## 2010-12-11 ENCOUNTER — Encounter (INDEPENDENT_AMBULATORY_CARE_PROVIDER_SITE_OTHER): Payer: Medicare Other | Admitting: Thoracic Surgery (Cardiothoracic Vascular Surgery)

## 2010-12-11 DIAGNOSIS — I4891 Unspecified atrial fibrillation: Secondary | ICD-10-CM

## 2010-12-11 DIAGNOSIS — I059 Rheumatic mitral valve disease, unspecified: Secondary | ICD-10-CM

## 2010-12-11 NOTE — Assessment & Plan Note (Signed)
OFFICE VISIT  [name], [name] DOB:  27-May-1926                                        December 11, 2010 CHART #:  [id number]  HISTORY OF PRESENT ILLNESS:  The patient returns for followup now 2 years status post mitral valve repair and maze procedure.  She was last seen here in the office in June of 2011.  Since then, she has done exceptionally well.  She has been taken off of Coumadin.  She continues to do very well clinically, and she has had no signs of any recurrent congestive heart failure or atrial fibrillation.  She is currently recovering from a recent episode of bronchitis, but she is 90% improved. Overall, she feels great and she is able to do everything that she wants physically.  She has absolutely no complaints.  PHYSICAL EXAMINATION:  A well-appearing female who looks considerably younger than her stated age.  Blood pressure is 126/74, pulse 90 and regular.  Two-channel telemetry rhythm strip demonstrates normal sinus rhythm, oxygen saturation is 98% on room air.  Examination of the chest reveals clear breath sounds.  Cardiovascular exam is notable for regular rate and rhythm.  No murmurs, rubs, or gallops are noted.  The abdomen is soft, nontender.  The extremities are warm and well perfused.  There is no lower extremity edema.  IMPRESSION:  The patient is doing exceptionally well now 2 years out following mitral valve repair and maze procedure.  She is maintaining sinus rhythm and is off of Coumadin.  PLAN:  In the future, the patient will call and return to see [place] as needed.  Salvatore Decent. Cornelius Moras, M.D. Electronically Signed  CHO/MEDQ  D:  12/11/2010  T:  12/11/2010  Job:  [id number]  cc:   Lyn Records, M.D. [place]is Range, MD Theressa Millard, M.D.

## 2011-02-20 ENCOUNTER — Ambulatory Visit (HOSPITAL_BASED_OUTPATIENT_CLINIC_OR_DEPARTMENT_OTHER): Admission: RE | Admit: 2011-02-20 | Payer: Medicare Other | Source: Ambulatory Visit | Admitting: Orthopedic Surgery

## 2011-03-06 ENCOUNTER — Ambulatory Visit (HOSPITAL_BASED_OUTPATIENT_CLINIC_OR_DEPARTMENT_OTHER)
Admission: RE | Admit: 2011-03-06 | Discharge: 2011-03-06 | Disposition: A | Discharge status: Home / Self Care | Payer: Medicare Other | Source: Ambulatory Visit | Attending: Orthopedic Surgery | Admitting: Orthopedic Surgery

## 2011-03-06 DIAGNOSIS — M19049 Primary osteoarthritis, unspecified hand: Secondary | ICD-10-CM | POA: Insufficient documentation

## 2011-03-06 DIAGNOSIS — D1610 Benign neoplasm of short bones of unspecified upper limb: Secondary | ICD-10-CM | POA: Insufficient documentation

## 2011-03-06 DIAGNOSIS — M6740 Ganglion, unspecified site: Secondary | ICD-10-CM | POA: Insufficient documentation

## 2011-03-06 DIAGNOSIS — M24049 Loose body in unspecified finger joint(s): Secondary | ICD-10-CM | POA: Insufficient documentation

## 2011-03-09 NOTE — Op Note (Signed)
[name]                 ACCOUNT NO.:  [id number]  MEDICAL RECORD NO.:  [id number]  LOCATION:                                 FACILITY:  PHYSICIAN:  Tara Fitch. Mayra Brahm, M.D.      DATE OF BIRTH:  DATE OF PROCEDURE:  03/06/2011 DATE OF DISCHARGE:                              OPERATIVE REPORT   PREOPERATIVE DIAGNOSIS:  Chronic degenerative arthritis right ring finger distal interphalangeal joint with hypertrophic marginal osteophytes and large dorsal radial mucoid cyst with history of drainage.  POSTOPERATIVE DIAGNOSIS:  Severe degenerative arthritis of right ring finger distal interphalangeal joint with osteocartilaginous loose bodies and significant synovitis and marginal osteophyte formation.  OPERATION: 1. Resection of mucoid cyst dorsal radial aspect right ring finger     distal interphalangeal joint. 2. Arthrotomy of right ring finger distal interphalangeal joint with     synovectomy, removal of osteocartilaginous loose bodies, and     resection of marginal osteophytes from base of distal phalanx.  OPERATING SURGEON:  Tara Fitch. Scotty Pinder, MD  ASSISTANT:  Annye Rusk, PA-C  ANESTHESIA:  Lidocaine 2% digital block of right ring finger.  This was performed as a minor operating room procedure.  INDICATIONS:  [name] is an 77 year old woman referred through the courtesy of Dr. Venancio Poisson, dermatologist, for evaluation and management of a recurrent mucoid cyst in her right ring finger at the distal interphalangeal joint.  She has background osteoarthrosis.  She requested that this be resected.  We advised her that to have a greater degree of success we would need to debride the DIP joint of loose bodies, perform a synovectomy and resect the mucoid cyst. Questions were invited and answered in detail regarding the procedure.  Preoperatively, she was reminded of the potential risks and benefits of surgery which include infection, failure to relieve the cyst.   She understands that we cannot affect the natural history of her osteoarthritis.  PROCEDURE:  [name] was brought to room 2 of the [facility] and placed in supine position upon the operating table.  Following informed consent and Betadine prep, 2 mL of 2% plain lidocaine were infiltrated into the flexor sheath and metacarpal head region to obtain a digital block of the right ring finger.  After few moments, complete anesthesia was achieved.  The right hand and arm were then prepped with Betadine soap and solution and sterilely draped.  After a routine surgical time-out, the ring finger was exsanguinated with a gauze wrap and half inch Penrose drain was placed at the base of the proximal phalanx as a digital tourniquet.  The procedure commenced with an elliptical excision of the mucoid cyst.  A rongeur dissection was used to remove all mucin followed by debridement of the sinus tract of the distal interphalangeal joint.  An arthrotomy was then accomplished by resection of a triangular portion of the capsule between the terminal extensor tendon slip and the radial collateral ligament.  With great care, a Henner elevator was used to explore the joint.  Multiple osteocartilaginous loose bodies were debrided followed by complete dorsal synovectomy with a microrongeur and microcurette, followed by use of the rongeur to  resect marginal osteophytes from the base of distal phalanx.  The joint was then thoroughly lavaged with sterile saline until the effluent was clear.  To the best of our knowledge, all of the dorsal loose bodies were resected.  The wound was then repaired with mattress sutures of 4-0 nylon.  Finger was dressed with Xeroflo, sterile gauze, and a Coban dressing.  There were no apparent complications.  [name] was advised to keep her dressing dry for the next 4-5 days. Thereafter, she may remove the dressing and utilize Band-Aids changed several times daily.   She is provided prescriptions for Ultram 50 mg one p.o. q. 4-6 h. p.r.n. pain 16 tablets for pain.  She will use Tylenol. She is also provided doxycycline 100 mg p.o. b.i.d. x4 days as a prophylactic antibiotic due to joint entry.     [name], M.D.     RVS/MEDQ  D:  03/06/2011  T:  03/06/2011  Job:  [id number]  cc:   Venancio Poisson, MD Theressa Millard, M.D.  Electronically Signed by Josephine Igo M.D. on 03/09/2011 09:07:17 AM

## 2011-04-05 LAB — DIFFERENTIAL
Basophils Absolute: 0
Basophils Absolute: 0
Basophils Relative: 0
Basophils Relative: 1
Eosinophils Absolute: 0
Eosinophils Absolute: 0
Eosinophils Relative: 0
Eosinophils Relative: 1
Lymphocytes Relative: 24
Lymphocytes Relative: 8 — ABNORMAL LOW
Lymphs Abs: 0.9
Lymphs Abs: 1.8
Monocytes Absolute: 0.5
Monocytes Absolute: 0.6
Monocytes Relative: 4
Monocytes Relative: 8
Neutro Abs: 5.2
Neutro Abs: 9.5 — ABNORMAL HIGH
Neutrophils Relative %: 68
Neutrophils Relative %: 87 — ABNORMAL HIGH

## 2011-04-05 LAB — CBC
HCT: 33.5 — ABNORMAL LOW
HCT: 42.6
Hemoglobin: 11.5 — ABNORMAL LOW
Hemoglobin: 14.6
MCHC: 34.4
MCHC: 34.4
MCV: 100
MCV: 100.8 — ABNORMAL HIGH
Platelets: 141 — ABNORMAL LOW
Platelets: 169
RBC: 3.35 — ABNORMAL LOW
RBC: 4.22
RDW: 12.9
RDW: 13.1
WBC: 11 — ABNORMAL HIGH
WBC: 7.6

## 2011-04-05 LAB — COMPREHENSIVE METABOLIC PANEL
ALT: 21
AST: 30
Albumin: 4.2
Alkaline Phosphatase: 65
BUN: 17
CO2: 24
Calcium: 9.8
Chloride: 98
Creatinine, Ser: 0.62
GFR calc Af Amer: >60
GFR calc non Af Amer: >60
Glucose, Bld: 129 — ABNORMAL HIGH
Potassium: 4.1
Sodium: 134 — ABNORMAL LOW
Total Bilirubin: 1.3 — ABNORMAL HIGH
Total Protein: 7.7

## 2011-04-05 LAB — BASIC METABOLIC PANEL
BUN: 8
CO2: 24
Calcium: 8.3 — ABNORMAL LOW
Chloride: 109
Creatinine, Ser: 0.5
GFR calc Af Amer: >60
GFR calc non Af Amer: >60
Glucose, Bld: 110 — ABNORMAL HIGH
Potassium: 3.6
Sodium: 138

## 2011-04-05 LAB — PROTIME-INR
INR: 0.9
Prothrombin Time: 12.4

## 2011-04-05 LAB — APTT: aPTT: 26

## 2011-04-05 LAB — CULTURE, BLOOD (ROUTINE X 2)
Culture: NO GROWTH
Culture: NO GROWTH

## 2011-04-05 LAB — URINE MICROSCOPIC-ADD ON

## 2011-04-05 LAB — URINALYSIS, ROUTINE W REFLEX MICROSCOPIC
Bilirubin Urine: NEGATIVE
Glucose, UA: NEGATIVE
Hgb urine dipstick: NEGATIVE
Nitrite: POSITIVE — AB
Protein, ur: NEGATIVE
Specific Gravity, Urine: 1.01
Urobilinogen, UA: 1
pH: 6.5

## 2011-04-05 LAB — LIPASE, BLOOD: Lipase: 25

## 2011-04-05 LAB — STOOL CULTURE

## 2011-04-05 LAB — SAMPLE TO BLOOD BANK

## 2011-04-05 LAB — OVA AND PARASITE EXAMINATION: Ova and parasites: NONE SEEN

## 2011-04-05 LAB — CLOSTRIDIUM DIFFICILE EIA: C difficile Toxins A+B, EIA: NEGATIVE

## 2011-04-05 LAB — MAGNESIUM: Magnesium: 2.5

## 2011-04-09 LAB — CBC
HCT: 34.4 — ABNORMAL LOW
HCT: 34.8 — ABNORMAL LOW
HCT: 38.3
HCT: 38.5
HCT: 39.1
HCT: 40.8
HCT: 38.8
Hemoglobin: 11.9 — ABNORMAL LOW
Hemoglobin: 12
Hemoglobin: 13
Hemoglobin: 13.2
Hemoglobin: 13.4
Hemoglobin: 13.7
Hemoglobin: 13
MCHC: 33.7
MCHC: 34
MCHC: 34.1
MCHC: 34.1
MCHC: 34.3
MCHC: 35
MCHC: 33.5
MCV: 100.3 — ABNORMAL HIGH
MCV: 100.9 — ABNORMAL HIGH
MCV: 101.1 — ABNORMAL HIGH
MCV: 101.4 — ABNORMAL HIGH
MCV: 101.7 — ABNORMAL HIGH
MCV: 99.4
MCV: 101.9 — ABNORMAL HIGH
Platelets: 131 — ABNORMAL LOW
Platelets: 132 — ABNORMAL LOW
Platelets: 144 — ABNORMAL LOW
Platelets: 146 — ABNORMAL LOW
Platelets: 155
Platelets: 156
Platelets: 182
RBC: 3.43 — ABNORMAL LOW
RBC: 3.46 — ABNORMAL LOW
RBC: 3.8 — ABNORMAL LOW
RBC: 3.84 — ABNORMAL LOW
RBC: 3.87
RBC: 4.01
RBC: 3.81 — ABNORMAL LOW
RDW: 13
RDW: 13.1
RDW: 13.3
RDW: 13.3
RDW: 13.4
RDW: 13.4
RDW: 12.4
WBC: 4.4
WBC: 5
WBC: 5.1
WBC: 5.7
WBC: 6.1
WBC: 6.3
WBC: 5.2

## 2011-04-09 LAB — DIFFERENTIAL
Basophils Absolute: 0
Basophils Absolute: 0
Basophils Absolute: 0
Basophils Relative: 0
Basophils Relative: 0
Basophils Relative: 0
Eosinophils Absolute: 0
Eosinophils Absolute: 0.1
Eosinophils Absolute: 0
Eosinophils Relative: 0
Eosinophils Relative: 1
Eosinophils Relative: 0
Lymphocytes Relative: 25
Lymphocytes Relative: 27
Lymphocytes Relative: 21
Lymphs Abs: 1.5
Lymphs Abs: 1.5
Lymphs Abs: 1.1
Monocytes Absolute: 0.4
Monocytes Absolute: 0.6
Monocytes Absolute: 0.4
Monocytes Relative: 8
Monocytes Relative: 9
Monocytes Relative: 8
Neutro Abs: 3.7
Neutro Abs: 3.9
Neutro Abs: 3.7
Neutrophils Relative %: 64
Neutrophils Relative %: 64
Neutrophils Relative %: 70

## 2011-04-09 LAB — CARDIAC PANEL(CRET KIN+CKTOT+MB+TROPI)
CK, MB: 2
CK, MB: 2.1
CK, MB: 2.2
CK, MB: 2.4
Relative Index: INVALID
Relative Index: INVALID
Relative Index: INVALID
Relative Index: INVALID
Total CK: 63
Total CK: 69
Total CK: 81
Total CK: 85
Troponin I: 0.01
Troponin I: 0.01
Troponin I: 0.01
Troponin I: 0.02

## 2011-04-09 LAB — MAGNESIUM: Magnesium: 2.3

## 2011-04-09 LAB — URINALYSIS, ROUTINE W REFLEX MICROSCOPIC
Bilirubin Urine: NEGATIVE
Glucose, UA: NEGATIVE
Hgb urine dipstick: NEGATIVE
Ketones, ur: 15 — AB
Nitrite: NEGATIVE
Protein, ur: NEGATIVE
Specific Gravity, Urine: 1.013
Urobilinogen, UA: 1
pH: 6

## 2011-04-09 LAB — COMPREHENSIVE METABOLIC PANEL
ALT: 19
AST: 23
Albumin: 3.9
Alkaline Phosphatase: 55
BUN: 8
CO2: 27
Calcium: 9.7
Chloride: 103
Creatinine, Ser: 0.53
GFR calc Af Amer: >60
GFR calc non Af Amer: >60
Glucose, Bld: 128 — ABNORMAL HIGH
Potassium: 3.5
Sodium: 139
Total Bilirubin: 0.8
Total Protein: 7.3

## 2011-04-09 LAB — D-DIMER, QUANTITATIVE (NOT AT ARMC): D-Dimer, Quant: 0.51 — ABNORMAL HIGH

## 2011-04-09 LAB — APTT
aPTT: 103 — ABNORMAL HIGH
aPTT: 31

## 2011-04-09 LAB — TSH: TSH: 2.323

## 2011-04-09 LAB — CK TOTAL AND CKMB (NOT AT ARMC)
CK, MB: 2
CK, MB: 1.8
Relative Index: INVALID
Relative Index: INVALID
Total CK: 76
Total CK: 66

## 2011-04-09 LAB — POCT CARDIAC MARKERS
CKMB, poc: 1.1
CKMB, poc: 2
CKMB, poc: 1.6
Myoglobin, poc: 110
Myoglobin, poc: 41.7
Myoglobin, poc: 60.4
Troponin i, poc: <0.05
Troponin i, poc: <0.05
Troponin i, poc: <0.05

## 2011-04-09 LAB — LIPASE, BLOOD: Lipase: 21

## 2011-04-09 LAB — POCT I-STAT, CHEM 8
BUN: 9
Calcium, Ion: 1.07 — ABNORMAL LOW
Chloride: 107
Creatinine, Ser: 0.8
Glucose, Bld: 109 — ABNORMAL HIGH
HCT: 38
Hemoglobin: 12.9
Potassium: 4.1
Sodium: 141
TCO2: 24

## 2011-04-09 LAB — PROTIME-INR
INR: 1.2
INR: 1.4
INR: 1.6 — ABNORMAL HIGH
INR: 2.1 — ABNORMAL HIGH
INR: 2.3 — ABNORMAL HIGH
Prothrombin Time: 15.7 — ABNORMAL HIGH
Prothrombin Time: 17.4 — ABNORMAL HIGH
Prothrombin Time: 19.4 — ABNORMAL HIGH
Prothrombin Time: 24.5 — ABNORMAL HIGH
Prothrombin Time: 27 — ABNORMAL HIGH

## 2011-04-09 LAB — BASIC METABOLIC PANEL
BUN: 10
BUN: 8
BUN: 9
CO2: 23
CO2: 24
CO2: 24
Calcium: 8.6
Calcium: 8.8
Calcium: 9.8
Chloride: 104
Chloride: 105
Chloride: 107
Creatinine, Ser: 0.63
Creatinine, Ser: 0.65
Creatinine, Ser: 0.68
GFR calc Af Amer: >60
GFR calc Af Amer: >60
GFR calc Af Amer: >60
GFR calc non Af Amer: >60
GFR calc non Af Amer: >60
GFR calc non Af Amer: >60
Glucose, Bld: 104 — ABNORMAL HIGH
Glucose, Bld: 104 — ABNORMAL HIGH
Glucose, Bld: 106 — ABNORMAL HIGH
Potassium: 3.7
Potassium: 4
Potassium: 4.3
Sodium: 134 — ABNORMAL LOW
Sodium: 138
Sodium: 140

## 2011-04-09 LAB — URINE CULTURE: Colony Count: >=100000

## 2011-04-09 LAB — VITAMIN B12: Vitamin B-12: 603 (ref 211–911)

## 2011-04-09 LAB — FOLATE: Folate: >20

## 2011-04-09 LAB — TROPONIN I
Troponin I: 0.03
Troponin I: 0.04

## 2011-04-09 LAB — HEPARIN LEVEL (UNFRACTIONATED)
Heparin Unfractionated: 0.42
Heparin Unfractionated: 0.67

## 2011-04-10 LAB — POCT I-STAT, CHEM 8
BUN: 10
Calcium, Ion: 1.14
Chloride: 104
Creatinine, Ser: 0.8
Glucose, Bld: 104 — ABNORMAL HIGH
HCT: 41
Hemoglobin: 13.9
Potassium: 3.9
Sodium: 140
TCO2: 27

## 2011-04-10 LAB — PROTIME-INR
INR: 2.9 — ABNORMAL HIGH
Prothrombin Time: 32.3 — ABNORMAL HIGH

## 2011-04-20 LAB — CROSSMATCH
ABO/RH(D): A POS
Antibody Screen: NEGATIVE

## 2011-04-20 LAB — BASIC METABOLIC PANEL
BUN: 12
BUN: 9
CO2: 27
CO2: 31
Calcium: 8.5
Calcium: 9.8
Chloride: 101
Chloride: 102
Creatinine, Ser: 0.57
Creatinine, Ser: 0.58
GFR calc Af Amer: >60
GFR calc Af Amer: >60
GFR calc non Af Amer: >60
GFR calc non Af Amer: >60
Glucose, Bld: 108 — ABNORMAL HIGH
Glucose, Bld: 115 — ABNORMAL HIGH
Potassium: 4.1
Potassium: 4.6
Sodium: 134 — ABNORMAL LOW
Sodium: 142

## 2011-04-20 LAB — CBC
HCT: 32 — ABNORMAL LOW
HCT: 36.7
Hemoglobin: 11.1 — ABNORMAL LOW
Hemoglobin: 12.5
MCHC: 34
MCHC: 34.8
MCV: 100.4 — ABNORMAL HIGH
MCV: 99.4
Platelets: 169
Platelets: 199
RBC: 3.22 — ABNORMAL LOW
RBC: 3.66 — ABNORMAL LOW
RDW: 13.7
RDW: 14.3 — ABNORMAL HIGH
WBC: 6.5
WBC: 9

## 2011-04-20 LAB — PROTIME-INR
INR: 0.9
Prothrombin Time: 12.6

## 2011-04-20 LAB — APTT: aPTT: 28

## 2011-04-20 LAB — ABO/RH: ABO/RH(D): A POS

## 2011-07-11 DIAGNOSIS — H6120 Impacted cerumen, unspecified ear: Secondary | ICD-10-CM | POA: Diagnosis not present

## 2011-07-11 DIAGNOSIS — H902 Conductive hearing loss, unspecified: Secondary | ICD-10-CM | POA: Diagnosis not present

## 2011-07-17 ENCOUNTER — Other Ambulatory Visit: Payer: Self-pay | Admitting: Internal Medicine

## 2011-07-17 DIAGNOSIS — Z1231 Encounter for screening mammogram for malignant neoplasm of breast: Secondary | ICD-10-CM

## 2011-08-09 ENCOUNTER — Ambulatory Visit
Admission: RE | Admit: 2011-08-09 | Discharge: 2011-08-09 | Disposition: A | Discharge status: Home / Self Care | Payer: Medicare Other | Source: Ambulatory Visit | Attending: Internal Medicine | Admitting: Internal Medicine

## 2011-08-09 DIAGNOSIS — Z1231 Encounter for screening mammogram for malignant neoplasm of breast: Secondary | ICD-10-CM

## 2011-08-24 ENCOUNTER — Emergency Department (HOSPITAL_COMMUNITY)
Admission: EM | Admit: 2011-08-24 | Discharge: 2011-08-24 | Disposition: A | Discharge status: Home / Self Care | Payer: Medicare Other | Attending: Emergency Medicine | Admitting: Emergency Medicine

## 2011-08-24 ENCOUNTER — Emergency Department (HOSPITAL_COMMUNITY): Payer: Medicare Other

## 2011-08-24 DIAGNOSIS — J438 Other emphysema: Secondary | ICD-10-CM | POA: Diagnosis not present

## 2011-08-24 DIAGNOSIS — J90 Pleural effusion, not elsewhere classified: Secondary | ICD-10-CM | POA: Diagnosis not present

## 2011-08-24 DIAGNOSIS — M2550 Pain in unspecified joint: Secondary | ICD-10-CM | POA: Insufficient documentation

## 2011-08-24 DIAGNOSIS — S139XXA Sprain of joints and ligaments of unspecified parts of neck, initial encounter: Secondary | ICD-10-CM | POA: Diagnosis not present

## 2011-08-24 DIAGNOSIS — M545 Low back pain, unspecified: Secondary | ICD-10-CM | POA: Insufficient documentation

## 2011-08-24 DIAGNOSIS — Z79899 Other long term (current) drug therapy: Secondary | ICD-10-CM | POA: Diagnosis not present

## 2011-08-24 DIAGNOSIS — I059 Rheumatic mitral valve disease, unspecified: Secondary | ICD-10-CM | POA: Diagnosis not present

## 2011-08-24 DIAGNOSIS — M546 Pain in thoracic spine: Secondary | ICD-10-CM | POA: Insufficient documentation

## 2011-08-24 DIAGNOSIS — Z7982 Long term (current) use of aspirin: Secondary | ICD-10-CM | POA: Insufficient documentation

## 2011-08-24 DIAGNOSIS — IMO0002 Reserved for concepts with insufficient information to code with codable children: Secondary | ICD-10-CM | POA: Insufficient documentation

## 2011-08-24 DIAGNOSIS — S161XXA Strain of muscle, fascia and tendon at neck level, initial encounter: Secondary | ICD-10-CM

## 2011-08-24 DIAGNOSIS — S39012A Strain of muscle, fascia and tendon of lower back, initial encounter: Secondary | ICD-10-CM

## 2011-08-24 DIAGNOSIS — M542 Cervicalgia: Secondary | ICD-10-CM | POA: Insufficient documentation

## 2011-08-24 HISTORY — DX: Anxiety disorder, unspecified: F41.9

## 2011-08-24 MED ORDER — ACETAMINOPHEN 325 MG PO TABS: 650.0000 mg | ORAL_TABLET | Freq: Once | ORAL | Status: DC

## 2011-08-24 NOTE — ED Notes (Signed)
Family at beside. Pt assisted to get dressed. Will take pt out via wheelchair. Son at bedside driving pt home. Does not want any tylenol at this time.
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
## 2011-08-24 NOTE — ED Notes (Signed)
See trauma narrator 

## 2011-08-25 ENCOUNTER — Encounter (HOSPITAL_COMMUNITY): Payer: Self-pay | Admitting: *Deleted

## 2011-09-06 ENCOUNTER — Ambulatory Visit
Admission: RE | Admit: 2011-09-06 | Discharge: 2011-09-06 | Disposition: A | Discharge status: Home / Self Care | Payer: Medicare Other | Source: Ambulatory Visit | Attending: *Deleted | Admitting: *Deleted

## 2011-09-06 ENCOUNTER — Other Ambulatory Visit: Payer: Self-pay | Admitting: *Deleted

## 2011-09-06 DIAGNOSIS — M79606 Pain in leg, unspecified: Secondary | ICD-10-CM

## 2011-09-06 DIAGNOSIS — M79609 Pain in unspecified limb: Secondary | ICD-10-CM | POA: Diagnosis not present

## 2011-09-06 DIAGNOSIS — M7989 Other specified soft tissue disorders: Secondary | ICD-10-CM | POA: Diagnosis not present

## 2011-09-24 ENCOUNTER — Other Ambulatory Visit: Payer: Self-pay | Admitting: *Deleted

## 2011-09-24 DIAGNOSIS — R079 Chest pain, unspecified: Secondary | ICD-10-CM | POA: Diagnosis not present

## 2011-09-25 DIAGNOSIS — R071 Chest pain on breathing: Secondary | ICD-10-CM | POA: Diagnosis not present

## 2011-09-27 ENCOUNTER — Ambulatory Visit
Admission: RE | Admit: 2011-09-27 | Discharge: 2011-09-27 | Disposition: A | Discharge status: Home / Self Care | Payer: Medicare Other | Source: Ambulatory Visit | Attending: *Deleted | Admitting: *Deleted

## 2011-09-27 DIAGNOSIS — J9819 Other pulmonary collapse: Secondary | ICD-10-CM | POA: Diagnosis not present

## 2011-09-27 DIAGNOSIS — R918 Other nonspecific abnormal finding of lung field: Secondary | ICD-10-CM | POA: Diagnosis not present

## 2011-09-27 DIAGNOSIS — R799 Abnormal finding of blood chemistry, unspecified: Secondary | ICD-10-CM | POA: Diagnosis not present

## 2011-09-27 MED ORDER — IOHEXOL 350 MG/ML SOLN
60.0000 mL | Freq: Once | INTRAVENOUS | Status: AC | PRN
Start: 2011-09-27 — End: 2011-09-27
  Administered 2011-09-27: 60 mL via INTRAVENOUS

## 2011-10-03 ENCOUNTER — Other Ambulatory Visit: Payer: Self-pay | Admitting: Internal Medicine

## 2011-10-03 DIAGNOSIS — J984 Other disorders of lung: Secondary | ICD-10-CM

## 2011-10-24 DIAGNOSIS — L408 Other psoriasis: Secondary | ICD-10-CM | POA: Diagnosis not present

## 2011-11-18 DIAGNOSIS — N390 Urinary tract infection, site not specified: Secondary | ICD-10-CM | POA: Diagnosis not present

## 2011-12-06 DIAGNOSIS — Z961 Presence of intraocular lens: Secondary | ICD-10-CM | POA: Diagnosis not present

## 2011-12-06 DIAGNOSIS — H524 Presbyopia: Secondary | ICD-10-CM | POA: Diagnosis not present

## 2011-12-20 DIAGNOSIS — R059 Cough, unspecified: Secondary | ICD-10-CM | POA: Diagnosis not present

## 2011-12-20 DIAGNOSIS — E7800 Pure hypercholesterolemia, unspecified: Secondary | ICD-10-CM | POA: Diagnosis not present

## 2011-12-20 DIAGNOSIS — Z0000 Encounter for general adult medical examination without abnormal findings: Secondary | ICD-10-CM | POA: Diagnosis not present

## 2011-12-20 DIAGNOSIS — Z131 Encounter for screening for diabetes mellitus: Secondary | ICD-10-CM | POA: Diagnosis not present

## 2011-12-20 DIAGNOSIS — M949 Disorder of cartilage, unspecified: Secondary | ICD-10-CM | POA: Diagnosis not present

## 2011-12-20 DIAGNOSIS — R05 Cough: Secondary | ICD-10-CM | POA: Diagnosis not present

## 2011-12-20 DIAGNOSIS — M899 Disorder of bone, unspecified: Secondary | ICD-10-CM | POA: Diagnosis not present

## 2011-12-24 DIAGNOSIS — N390 Urinary tract infection, site not specified: Secondary | ICD-10-CM | POA: Diagnosis not present

## 2011-12-24 DIAGNOSIS — R300 Dysuria: Secondary | ICD-10-CM | POA: Diagnosis not present

## 2012-01-15 ENCOUNTER — Other Ambulatory Visit: Payer: Self-pay | Admitting: Internal Medicine

## 2012-01-15 DIAGNOSIS — N644 Mastodynia: Secondary | ICD-10-CM | POA: Diagnosis not present

## 2012-01-17 DIAGNOSIS — R300 Dysuria: Secondary | ICD-10-CM | POA: Diagnosis not present

## 2012-01-18 ENCOUNTER — Ambulatory Visit
Admission: RE | Admit: 2012-01-18 | Discharge: 2012-01-18 | Disposition: A | Discharge status: Home / Self Care | Payer: Medicare Other | Source: Ambulatory Visit | Attending: Internal Medicine | Admitting: Internal Medicine

## 2012-01-18 DIAGNOSIS — H902 Conductive hearing loss, unspecified: Secondary | ICD-10-CM | POA: Diagnosis not present

## 2012-01-18 DIAGNOSIS — N644 Mastodynia: Secondary | ICD-10-CM | POA: Diagnosis not present

## 2012-01-18 DIAGNOSIS — H6120 Impacted cerumen, unspecified ear: Secondary | ICD-10-CM | POA: Diagnosis not present

## 2012-01-18 DIAGNOSIS — Z853 Personal history of malignant neoplasm of breast: Secondary | ICD-10-CM | POA: Diagnosis not present

## 2012-01-21 DIAGNOSIS — N3946 Mixed incontinence: Secondary | ICD-10-CM | POA: Diagnosis not present

## 2012-01-31 ENCOUNTER — Ambulatory Visit
Admission: RE | Admit: 2012-01-31 | Discharge: 2012-01-31 | Disposition: A | Discharge status: Home / Self Care | Payer: Medicare Other | Source: Ambulatory Visit | Attending: Internal Medicine | Admitting: Internal Medicine

## 2012-01-31 DIAGNOSIS — J984 Other disorders of lung: Secondary | ICD-10-CM

## 2012-01-31 DIAGNOSIS — R911 Solitary pulmonary nodule: Secondary | ICD-10-CM | POA: Diagnosis not present

## 2012-01-31 DIAGNOSIS — R918 Other nonspecific abnormal finding of lung field: Secondary | ICD-10-CM | POA: Diagnosis not present

## 2012-02-01 ENCOUNTER — Other Ambulatory Visit: Payer: Medicare Other

## 2012-02-05 DIAGNOSIS — N3941 Urge incontinence: Secondary | ICD-10-CM | POA: Diagnosis not present

## 2012-02-05 DIAGNOSIS — N3946 Mixed incontinence: Secondary | ICD-10-CM | POA: Diagnosis not present

## 2012-02-12 DIAGNOSIS — N3941 Urge incontinence: Secondary | ICD-10-CM | POA: Diagnosis not present

## 2012-02-19 DIAGNOSIS — N3941 Urge incontinence: Secondary | ICD-10-CM | POA: Diagnosis not present

## 2012-02-22 ENCOUNTER — Other Ambulatory Visit: Payer: Self-pay | Admitting: Internal Medicine

## 2012-02-22 DIAGNOSIS — R911 Solitary pulmonary nodule: Secondary | ICD-10-CM

## 2012-02-26 DIAGNOSIS — N3941 Urge incontinence: Secondary | ICD-10-CM | POA: Diagnosis not present

## 2012-03-04 DIAGNOSIS — N3941 Urge incontinence: Secondary | ICD-10-CM | POA: Diagnosis not present

## 2012-03-11 DIAGNOSIS — N3946 Mixed incontinence: Secondary | ICD-10-CM | POA: Diagnosis not present

## 2012-03-11 DIAGNOSIS — H6120 Impacted cerumen, unspecified ear: Secondary | ICD-10-CM | POA: Diagnosis not present

## 2012-03-11 DIAGNOSIS — H902 Conductive hearing loss, unspecified: Secondary | ICD-10-CM | POA: Diagnosis not present

## 2012-03-18 DIAGNOSIS — N3941 Urge incontinence: Secondary | ICD-10-CM | POA: Diagnosis not present

## 2012-03-25 DIAGNOSIS — N3941 Urge incontinence: Secondary | ICD-10-CM | POA: Diagnosis not present

## 2012-04-01 DIAGNOSIS — N3941 Urge incontinence: Secondary | ICD-10-CM | POA: Diagnosis not present

## 2012-04-08 DIAGNOSIS — N3941 Urge incontinence: Secondary | ICD-10-CM | POA: Diagnosis not present

## 2012-04-14 DIAGNOSIS — H60399 Other infective otitis externa, unspecified ear: Secondary | ICD-10-CM | POA: Diagnosis not present

## 2012-04-14 DIAGNOSIS — H6120 Impacted cerumen, unspecified ear: Secondary | ICD-10-CM | POA: Diagnosis not present

## 2012-04-15 DIAGNOSIS — N3941 Urge incontinence: Secondary | ICD-10-CM | POA: Diagnosis not present

## 2012-04-22 DIAGNOSIS — N3946 Mixed incontinence: Secondary | ICD-10-CM | POA: Diagnosis not present

## 2012-04-26 DIAGNOSIS — R079 Chest pain, unspecified: Secondary | ICD-10-CM | POA: Diagnosis not present

## 2012-04-27 ENCOUNTER — Emergency Department (HOSPITAL_COMMUNITY): Payer: Medicare Other

## 2012-04-27 ENCOUNTER — Encounter (HOSPITAL_COMMUNITY): Payer: Self-pay | Admitting: *Deleted

## 2012-04-27 ENCOUNTER — Inpatient Hospital Stay (HOSPITAL_COMMUNITY)
Admission: EM | Admit: 2012-04-27 | Discharge: 2012-05-01 | DRG: 313 | Disposition: A | Discharge status: Home / Self Care | Payer: Medicare Other | Attending: Internal Medicine | Admitting: Internal Medicine

## 2012-04-27 DIAGNOSIS — Z7901 Long term (current) use of anticoagulants: Secondary | ICD-10-CM

## 2012-04-27 DIAGNOSIS — Z9010 Acquired absence of unspecified breast and nipple: Secondary | ICD-10-CM

## 2012-04-27 DIAGNOSIS — Z66 Do not resuscitate: Secondary | ICD-10-CM | POA: Diagnosis present

## 2012-04-27 DIAGNOSIS — R0602 Shortness of breath: Secondary | ICD-10-CM | POA: Diagnosis not present

## 2012-04-27 DIAGNOSIS — R071 Chest pain on breathing: Secondary | ICD-10-CM | POA: Diagnosis not present

## 2012-04-27 DIAGNOSIS — Z87898 Personal history of other specified conditions: Secondary | ICD-10-CM

## 2012-04-27 DIAGNOSIS — I4891 Unspecified atrial fibrillation: Secondary | ICD-10-CM | POA: Diagnosis not present

## 2012-04-27 DIAGNOSIS — Z23 Encounter for immunization: Secondary | ICD-10-CM

## 2012-04-27 DIAGNOSIS — R0789 Other chest pain: Principal | ICD-10-CM | POA: Diagnosis present

## 2012-04-27 DIAGNOSIS — Z923 Personal history of irradiation: Secondary | ICD-10-CM

## 2012-04-27 DIAGNOSIS — F411 Generalized anxiety disorder: Secondary | ICD-10-CM | POA: Diagnosis present

## 2012-04-27 DIAGNOSIS — Z5181 Encounter for therapeutic drug level monitoring: Secondary | ICD-10-CM

## 2012-04-27 DIAGNOSIS — Z8679 Personal history of other diseases of the circulatory system: Secondary | ICD-10-CM

## 2012-04-27 DIAGNOSIS — I38 Endocarditis, valve unspecified: Secondary | ICD-10-CM | POA: Diagnosis present

## 2012-04-27 DIAGNOSIS — Z79899 Other long term (current) drug therapy: Secondary | ICD-10-CM

## 2012-04-27 DIAGNOSIS — K219 Gastro-esophageal reflux disease without esophagitis: Secondary | ICD-10-CM | POA: Diagnosis present

## 2012-04-27 DIAGNOSIS — I5189 Other ill-defined heart diseases: Secondary | ICD-10-CM | POA: Diagnosis present

## 2012-04-27 DIAGNOSIS — I509 Heart failure, unspecified: Secondary | ICD-10-CM | POA: Diagnosis present

## 2012-04-27 DIAGNOSIS — Z7982 Long term (current) use of aspirin: Secondary | ICD-10-CM

## 2012-04-27 DIAGNOSIS — R1115 Cyclical vomiting syndrome unrelated to migraine: Secondary | ICD-10-CM | POA: Diagnosis not present

## 2012-04-27 DIAGNOSIS — R791 Abnormal coagulation profile: Secondary | ICD-10-CM | POA: Diagnosis not present

## 2012-04-27 DIAGNOSIS — I059 Rheumatic mitral valve disease, unspecified: Secondary | ICD-10-CM | POA: Diagnosis present

## 2012-04-27 DIAGNOSIS — I4892 Unspecified atrial flutter: Secondary | ICD-10-CM | POA: Diagnosis present

## 2012-04-27 DIAGNOSIS — Z853 Personal history of malignant neoplasm of breast: Secondary | ICD-10-CM

## 2012-04-27 DIAGNOSIS — R079 Chest pain, unspecified: Secondary | ICD-10-CM | POA: Diagnosis present

## 2012-04-27 HISTORY — DX: Malignant (primary) neoplasm, unspecified: C80.1

## 2012-04-27 HISTORY — DX: Other complications of anesthesia, initial encounter: T88.59XA

## 2012-04-27 HISTORY — DX: Gastro-esophageal reflux disease without esophagitis: K21.9

## 2012-04-27 HISTORY — DX: Adverse effect of unspecified anesthetic, initial encounter: T41.45XA

## 2012-04-27 HISTORY — DX: Unspecified osteoarthritis, unspecified site: M19.90

## 2012-04-27 LAB — CBC
HCT: 37.6 % (ref 36.0–46.0)
HCT: 37.6 % (ref 36.0–46.0)
Hemoglobin: 13 g/dL (ref 12.0–15.0)
Hemoglobin: 13 g/dL (ref 12.0–15.0)
MCH: 33.6 pg (ref 26.0–34.0)
MCH: 33.8 pg (ref 26.0–34.0)
MCHC: 34.6 g/dL (ref 30.0–36.0)
MCHC: 34.6 g/dL (ref 30.0–36.0)
MCV: 97.2 fL (ref 78.0–100.0)
MCV: 97.7 fL (ref 78.0–100.0)
Platelets: 166 10*3/uL (ref 150–400)
Platelets: 175 10*3/uL (ref 150–400)
RBC: 3.87 MIL/uL (ref 3.87–5.11)
RBC: 3.85 MIL/uL — ABNORMAL LOW (ref 3.87–5.11)
RDW: 12.3 % (ref 11.5–15.5)
RDW: 12.3 % (ref 11.5–15.5)
WBC: 6.2 10*3/uL (ref 4.0–10.5)
WBC: 6.1 10*3/uL (ref 4.0–10.5)

## 2012-04-27 LAB — COMPREHENSIVE METABOLIC PANEL
ALT: 13 U/L (ref 0–35)
AST: 19 U/L (ref 0–37)
Albumin: 3.8 g/dL (ref 3.5–5.2)
Alkaline Phosphatase: 66 U/L (ref 39–117)
BUN: 18 mg/dL (ref 6–23)
CO2: 24 mEq/L (ref 19–32)
Calcium: 9.5 mg/dL (ref 8.4–10.5)
Chloride: 99 mEq/L (ref 96–112)
Creatinine, Ser: 0.5 mg/dL (ref 0.50–1.10)
GFR calc Af Amer: >90 mL/min (ref >90)
GFR calc non Af Amer: 86 mL/min — ABNORMAL LOW (ref >90)
Glucose, Bld: 102 mg/dL — ABNORMAL HIGH (ref 70–99)
Potassium: 3.9 mEq/L (ref 3.5–5.1)
Sodium: 134 mEq/L — ABNORMAL LOW (ref 135–145)
Total Bilirubin: 0.5 mg/dL (ref 0.3–1.2)
Total Protein: 7.6 g/dL (ref 6.0–8.3)

## 2012-04-27 LAB — POCT I-STAT TROPONIN I
Troponin i, poc: 0.01 ng/mL (ref 0.00–0.08)
Troponin i, poc: 0.01 ng/mL (ref 0.00–0.08)
Troponin i, poc: 0.02 ng/mL (ref 0.00–0.08)

## 2012-04-27 LAB — TROPONIN I
Troponin I: <0.3 ng/mL (ref <0.30)
Troponin I: <0.3 ng/mL (ref <0.30)
Troponin I: <0.3 ng/mL (ref <0.30)

## 2012-04-27 LAB — CREATININE, SERUM
Creatinine, Ser: 0.5 mg/dL (ref 0.50–1.10)
GFR calc Af Amer: >90 mL/min (ref >90)
GFR calc non Af Amer: 86 mL/min — ABNORMAL LOW (ref >90)

## 2012-04-27 LAB — PROTIME-INR
INR: 1 (ref 0.00–1.49)
Prothrombin Time: 13.1 seconds (ref 11.6–15.2)

## 2012-04-27 LAB — MAGNESIUM: Magnesium: 2.1 mg/dL (ref 1.5–2.5)

## 2012-04-27 LAB — APTT: aPTT: 30 seconds (ref 24–37)

## 2012-04-27 MED ORDER — SODIUM CHLORIDE 0.9 % IJ SOLN: 3.0000 mL | Freq: Two times a day (BID) | INTRAMUSCULAR | Status: DC

## 2012-04-27 MED ORDER — SODIUM CHLORIDE 0.9 % IV BOLUS (SEPSIS)
1000.0000 mL | Freq: Once | INTRAVENOUS | Status: AC
  Administered 2012-04-27: 1000 mL via INTRAVENOUS

## 2012-04-27 MED ORDER — ASPIRIN EC 81 MG PO TBEC
81.0000 mg | DELAYED_RELEASE_TABLET | Freq: Every day | ORAL | Status: DC
  Administered 2012-04-27 – 2012-04-30 (×4): 81 mg via ORAL
  Filled 2012-04-27 (×6): qty 1

## 2012-04-27 MED ORDER — SODIUM CHLORIDE 0.9 % IV SOLN: 250.0000 mL | INTRAVENOUS | Status: DC | PRN

## 2012-04-27 MED ORDER — ONDANSETRON HCL 4 MG/2ML IJ SOLN: 4.0000 mg | Freq: Four times a day (QID) | INTRAMUSCULAR | Status: DC | PRN

## 2012-04-27 MED ORDER — SODIUM CHLORIDE 0.9 % IJ SOLN
3.0000 mL | Freq: Two times a day (BID) | INTRAMUSCULAR | Status: DC
  Administered 2012-04-27 – 2012-05-01 (×6): 3 mL via INTRAVENOUS

## 2012-04-27 MED ORDER — LORAZEPAM 0.5 MG PO TABS
0.5000 mg | ORAL_TABLET | Freq: Every evening | ORAL | Status: DC | PRN
  Administered 2012-04-27 – 2012-05-01 (×4): 0.5 mg via ORAL
  Filled 2012-04-27 (×4): qty 1

## 2012-04-27 MED ORDER — FAMOTIDINE 20 MG PO TABS
20.0000 mg | ORAL_TABLET | Freq: Every evening | ORAL | Status: DC | PRN
  Administered 2012-04-30: 20 mg via ORAL
  Filled 2012-04-27: qty 1

## 2012-04-27 MED ORDER — DILTIAZEM HCL 25 MG/5ML IV SOLN
10.0000 mg | Freq: Once | INTRAVENOUS | Status: AC
  Administered 2012-04-27: 10 mg via INTRAVENOUS
  Filled 2012-04-27: qty 5

## 2012-04-27 MED ORDER — POLYVINYL ALCOHOL 1.4 % OP SOLN: 1.0000 [drp] | OPHTHALMIC | Status: DC | PRN

## 2012-04-27 MED ORDER — VITAMIN D3 25 MCG (1000 UNIT) PO TABS
1000.0000 [IU] | ORAL_TABLET | Freq: Every day | ORAL | Status: DC
  Administered 2012-04-27 – 2012-04-28 (×2): 1000 [IU] via ORAL
  Filled 2012-04-27 (×4): qty 1

## 2012-04-27 MED ORDER — ATENOLOL 25 MG PO TABS
25.0000 mg | ORAL_TABLET | Freq: Every day | ORAL | Status: DC
  Administered 2012-04-27 – 2012-04-28 (×2): 25 mg via ORAL
  Filled 2012-04-27 (×4): qty 1

## 2012-04-27 MED ORDER — SODIUM CHLORIDE 0.9 % IJ SOLN: 3.0000 mL | INTRAMUSCULAR | Status: DC | PRN

## 2012-04-27 MED ORDER — ONDANSETRON HCL 4 MG PO TABS
4.0000 mg | ORAL_TABLET | Freq: Four times a day (QID) | ORAL | Status: DC | PRN
  Filled 2012-04-27: qty 1

## 2012-04-27 MED ORDER — SENNA 8.6 MG PO TABS
1.0000 | ORAL_TABLET | Freq: Every evening | ORAL | Status: DC | PRN
  Administered 2012-04-30: 8.6 mg via ORAL
  Filled 2012-04-27: qty 1

## 2012-04-27 MED ORDER — HEPARIN SODIUM (PORCINE) 5000 UNIT/ML IJ SOLN
5000.0000 [IU] | Freq: Three times a day (TID) | INTRAMUSCULAR | Status: DC
  Administered 2012-04-27 – 2012-04-28 (×3): 5000 [IU] via SUBCUTANEOUS
  Filled 2012-04-27 (×6): qty 1

## 2012-04-27 MED ORDER — INFLUENZA VIRUS VACC SPLIT PF IM SUSP
0.5000 mL | INTRAMUSCULAR | Status: AC
  Administered 2012-04-28: 0.5 mL via INTRAMUSCULAR
  Filled 2012-04-27: qty 0.5

## 2012-04-27 NOTE — Progress Notes (Signed)
Pt having frequent 2-3 sec pauses.  VSS, no complaints other than "sleepy."  Remains in rate controlled A-flutter.  MD aware.  Will con't to monitor.

## 2012-04-27 NOTE — ED Provider Notes (Addendum)
History     CSN: [id number]  Arrival date & time 04/27/12  0211   First MD Initiated Contact with Patient 04/27/12 [phone]      Chief Complaint  Patient presents with  . Atrial Fibrillation    (Consider location/radiation/quality/duration/timing/severity/associated sxs/prior treatment) HPI  This patient is an elderly woman who reports a history of paroxysmal AF as well mitral valve repair 3 yrs ago. She presents with sensation of rapid heart beat. Her sx began 2d ago. She also reports right sided chest pain x 2 days which is worse with palpation of the right anterior chest and with movements of the torso. Pain is moderate in severity, sharp, non-radiating, patient is pain free at rest. Denies pleuritic pain, sob, cough.   Patient has not appreciated any LE edema. No recent changes to meds.   Patient says she was seen by a physician at a local urgent care center 2d ago and diagnosed with chest wall pain. She says she had an EKG at that time which was read as normal.   Past Medical History  Diagnosis Date  . Anxiety   . Atrial fibrillation   . GERD (gastroesophageal reflux disease)     Past Surgical History  Procedure Date  . Mitral valve repair June 2010  . Back surgery   . Cataract extraction   . Kidney cyst removal     No family history on file.  History  Substance Use Topics  . Smoking status: Not on file  . Smokeless tobacco: Not on file  . Alcohol Use:     OB History    Grav Para Term Preterm Abortions TAB SAB Ect Mult Living                  Review of Systems  Gen: no weight loss, fevers, chills, night sweats Eyes: no discharge or drainage, no occular pain or visual changes Nose: no epistaxis or rhinorrhea Mouth: no dental pain, no sore throat Neck: no neck pain Lungs: no SOB, cough, wheezing CV: As per history of present illness, otherwise negative Abd: no abdominal pain, nausea, vomiting GU: no dysuria or gross hematuria MSK: no myalgias or  arthralgias Neuro: no headache, no focal neurologic deficits Skin: no rash Psyche: negative.   Allergies  Amoxicillin; Amoxicillin-pot clavulanate; Codeine; Diuretic; Iohexol; Midazolam hcl; Quinolones; Sulfa antibiotics; Thioxanthenes; Valium; and Versed  Home Medications   Current Outpatient Rx  Name Route Sig Dispense Refill  . ASPIRIN EC 81 MG PO TBEC Oral Take 81 mg by mouth at bedtime.     . ATENOLOL 25 MG PO TABS Oral Take 12.5 mg by mouth at bedtime.    [name] VITAMIN D 1000 UNITS PO TABS Oral Take 1,000 Units by mouth daily.    [name] CO-ENZYME Q-10 50 MG PO CAPS Oral Take 50 mg by mouth daily.    [name] FAMOTIDINE 20 MG PO TABS Oral Take 20 mg by mouth at bedtime as needed. Acid reflux    . OMEGA-3 FATTY ACIDS 1000 MG PO CAPS Oral Take 1 g by mouth daily.    [name] FLAX SEEDS PO Oral Take 1 tablet by mouth daily.    [name] LORAZEPAM 0.5 MG PO TABS Oral Take 0.5 mg by mouth at bedtime as needed. For sleep    . OVER THE COUNTER MEDICATION Oral Take 1 each by mouth daily. Hemp heart seeds    . OVER THE COUNTER MEDICATION Oral Take 1 each by mouth daily. Greens plus supplement    .  POLYVINYL ALCOHOL 1.4 % OP SOLN Both Eyes Place 1 drop into both eyes as needed. For dry eyes    . SALINE NASAL SPRAY 0.65 % NA SOLN Nasal Place 1 spray into the nose as needed. Dry nose    . VITAMIN C 500 MG PO TABS Oral Take 500 mg by mouth daily.      BP 106/61  Pulse 97  Temp 97.8 F (36.6 C) (Oral)  Resp 22  SpO2 100%  Physical Exam Gen: well developed and well nourished appearing Head: NCAT Eyes: PERL, EOMI Nose: no epistaixis or rhinorrhea Mouth/throat: mucosa is moist and pink Neck: supple, no stridor Lungs: CTA B, no wheezing, rhonchi or rales, discrete region of chest wall tenderness which reproduces and excacerbates patient's CP - located over medial aspect of right anterior chest wall at approx T4 level.  CV: irregularly irregular, 2/6 sem, tachy in low 100s, good distal pulses B Abd: soft, notender,  nondistended Back: no ttp, no cva ttp Skin: no rashese, wnl Neuro: CN ii-xii grossly intact, no focal deficits Psyche; normal affect,  calm and cooperative.  ED Course  Procedures (including critical care time)  Results for orders placed during the hospital encounter of 04/27/12 (from the past 24 hour(s))  CBC     Status: Normal   Collection Time   04/27/12  4:10 AM      Component Value Range   WBC 6.2  4.0 - 10.5 K/uL   RBC 3.87  3.87 - 5.11 MIL/uL   Hemoglobin 13.0  12.0 - 15.0 g/dL   HCT 16.1  09.6 - 04.5 %   MCV 97.2  78.0 - 100.0 fL   MCH 33.6  26.0 - 34.0 pg   MCHC 34.6  30.0 - 36.0 g/dL   RDW 40.9  81.1 - 91.4 %   Platelets 166  150 - 400 K/uL  COMPREHENSIVE METABOLIC PANEL     Status: Abnormal   Collection Time   04/27/12  4:10 AM      Component Value Range   Sodium 134 (*) 135 - 145 mEq/L   Potassium 3.9  3.5 - 5.1 mEq/L   Chloride 99  96 - 112 mEq/L   CO2 24  19 - 32 mEq/L   Glucose, Bld 102 (*) 70 - 99 mg/dL   BUN 18  6 - 23 mg/dL   Creatinine, Ser 7.82  0.50 - 1.10 mg/dL   Calcium 9.5  8.4 - 95.6 mg/dL   Total Protein 7.6  6.0 - 8.3 g/dL   Albumin 3.8  3.5 - 5.2 g/dL   AST 19  0 - 37 U/L   ALT 13  0 - 35 U/L   Alkaline Phosphatase 66  39 - 117 U/L   Total Bilirubin 0.5  0.3 - 1.2 mg/dL   GFR calc non Af Amer 86 (*) >90 mL/min   GFR calc Af Amer >90  >90 mL/min  MAGNESIUM     Status: Normal   Collection Time   04/27/12  4:10 AM      Component Value Range   Magnesium 2.1  1.5 - 2.5 mg/dL  PROTIME-INR     Status: Normal   Collection Time   04/27/12  4:10 AM      Component Value Range   Prothrombin Time 13.1  11.6 - 15.2 seconds   INR 1.00  0.00 - 1.49  APTT     Status: Normal   Collection Time   04/27/12  4:10 AM  Component Value Range   aPTT 30  24 - 37 seconds  POCT I-STAT TROPONIN I     Status: Normal   Collection Time   04/27/12  4:10 AM      Component Value Range   Troponin i, poc 0.01  0.00 - 0.08 ng/mL   Comment 3            EKG:  regularly irregular rhythm which is suspicious for atrial flutter with block, no acute ischemic changes, normal axis  Dg Chest Portable 1 View  04/27/2012  *RADIOLOGY REPORT*  Clinical Data: Atrial fibrillation.  PORTABLE CHEST - 1 VIEW  Comparison: 09/24/2011  Findings: Prominent soft tissue in the right cardiophrenic angle as compared to previous CT chest from 01/31/2012 and represents segmental elevation of left hemidiaphragm.  Mild cardiac enlargement with normal pulmonary vascularity.  Cardiac valve prosthesis.  Calcification and torsion of the aorta.  No focal airspace consolidation in the lungs.  No blunting of costophrenic angles.  No pneumothorax.  Mediastinal contours appear intact.  No significant change since previous study.  IMPRESSION: No evidence of active pulmonary disease.   Original Report Authenticated By: Marlon Pel, M.D.     Patient with chest wall pain and what appears to be atrial flutter with block. Rate varies from 100 to 140. At one point, the patient's rate was persistently in 130s. Patient treated with a single dose of diltiazem 10mg  IV. Rate has been in 100s Rhythm unchanged. Lytes wn. Cardiac markers wnl. CXR wnl.   [facility] hospitalist paged to request admission for further evaluation and tx of dysrythmia.   MDM  Please see above.         Brandt Loosen, MD 04/27/12 [id number]  Brandt Loosen, MD 04/27/12 [phone]  Case discussed with Dr. Cena Benton who will see and evaluate for admission.   Brandt Loosen, MD 04/27/12 [phone]

## 2012-04-27 NOTE — ED Notes (Signed)
C/o feeling bad 3 days ago with right CP, nausea  And vomiting.  Talked to MD who rx nausea med.  After care office did 12 lead ?  Rhythm.  Denies CP on arrival,. Feels like her heart is racing.  Denies SOB, nausea, vomiting

## 2012-04-27 NOTE — Progress Notes (Signed)
Pt with 6 .4 sec pause per telemetry.  Asymptomatic, alert and responsive on assessment.  Returned to A-flutter with HR in 60's.  MD notified, no orders received.  Will con't to monitor and report thoroughly.

## 2012-04-27 NOTE — ED Notes (Signed)
Patient's son leaving at this time, would like to be called if there are any concerns or questions.  Dimas Millin, cell number [phone].

## 2012-04-28 ENCOUNTER — Encounter (HOSPITAL_COMMUNITY): Payer: Self-pay | Admitting: Family Medicine

## 2012-04-28 DIAGNOSIS — K219 Gastro-esophageal reflux disease without esophagitis: Secondary | ICD-10-CM | POA: Diagnosis not present

## 2012-04-28 DIAGNOSIS — Z8679 Personal history of other diseases of the circulatory system: Secondary | ICD-10-CM | POA: Diagnosis not present

## 2012-04-28 DIAGNOSIS — I4892 Unspecified atrial flutter: Secondary | ICD-10-CM

## 2012-04-28 DIAGNOSIS — R079 Chest pain, unspecified: Secondary | ICD-10-CM | POA: Diagnosis present

## 2012-04-28 DIAGNOSIS — R0602 Shortness of breath: Secondary | ICD-10-CM | POA: Diagnosis present

## 2012-04-28 LAB — CBC
HCT: 35.2 % — ABNORMAL LOW (ref 36.0–46.0)
Hemoglobin: 12 g/dL (ref 12.0–15.0)
MCH: 33.2 pg (ref 26.0–34.0)
MCHC: 34.1 g/dL (ref 30.0–36.0)
MCV: 97.5 fL (ref 78.0–100.0)
Platelets: 159 10*3/uL (ref 150–400)
RBC: 3.61 MIL/uL — ABNORMAL LOW (ref 3.87–5.11)
RDW: 12.4 % (ref 11.5–15.5)
WBC: 5.3 10*3/uL (ref 4.0–10.5)

## 2012-04-28 LAB — BASIC METABOLIC PANEL
BUN: 16 mg/dL (ref 6–23)
CO2: 26 mEq/L (ref 19–32)
Calcium: 8.8 mg/dL (ref 8.4–10.5)
Chloride: 100 mEq/L (ref 96–112)
Creatinine, Ser: 0.58 mg/dL (ref 0.50–1.10)
GFR calc Af Amer: >90 mL/min (ref >90)
GFR calc non Af Amer: 82 mL/min — ABNORMAL LOW (ref >90)
Glucose, Bld: 95 mg/dL (ref 70–99)
Potassium: 3.6 mEq/L (ref 3.5–5.1)
Sodium: 133 mEq/L — ABNORMAL LOW (ref 135–145)

## 2012-04-28 LAB — HEPARIN LEVEL (UNFRACTIONATED): Heparin Unfractionated: 0.69 IU/mL (ref 0.30–0.70)

## 2012-04-28 MED ORDER — HEPARIN (PORCINE) IN NACL 100-0.45 UNIT/ML-% IJ SOLN
850.0000 [IU]/h | INTRAMUSCULAR | Status: DC
  Administered 2012-04-28: 950 [IU]/h via INTRAVENOUS
  Filled 2012-04-28 (×2): qty 250

## 2012-04-28 MED ORDER — POLYETHYLENE GLYCOL 3350 17 G PO PACK
17.0000 g | PACK | Freq: Every day | ORAL | Status: DC
  Administered 2012-04-28: 17 g via ORAL
  Filled 2012-04-28 (×2): qty 1

## 2012-04-28 NOTE — Progress Notes (Signed)
ANTICOAGULATION CONSULT NOTE - Initial Consult  Pharmacy Consult for Heparin Indication: atrial fibrillation/flutter  Allergies  Allergen Reactions  . Amoxicillin Nausea And Vomiting  . Amoxicillin-Pot Clavulanate Nausea And Vomiting  . Codeine Nausea And Vomiting  . Diuretic (Buchu-[place]sil[place]-Ch Grass-Hydran)     Pharmacy record  . Iohexol Hives     Code: HIVES, Desc: **11/22/08 **NO REACTION W/ OMNI 300// PT S/P 3 CT SCANS W/ IV CM AND NO HIVES/MMS**PT STATED HIVES 16 YRS AGO. POSSIBLE IV DYE W/ CT/XRAY. MEDICATED W/ 50MG  OF BENEDRYL PO PRIOR TO SCAN, Onset Date: [id number]   . Midazolam Hcl Nausea And Vomiting  . Quinolones Other (See Comments)    Unknown reaction  . Sulfa Antibiotics Other (See Comments)    Flu like symptoms 'serum sickness'  . Thioxanthenes Other (See Comments)    Flu like symptoms 'serum sickness'  . Valium Nausea And Vomiting  . Versed (Midazolam) Nausea And Vomiting   Patient Measurements: Height: 5\' 3"  (160 cm) Weight: 140 lb 10.5 oz (63.8 kg) IBW/kg (Calculated) : 52.4  Heparin Dosing Weight: 64 kg  Vital Signs: Temp: 97.6 F (36.4 C) (10/21 1330) Temp src: Oral (10/21 1330) BP: 105/56 mmHg (10/21 1330) Pulse Rate: 84  (10/21 1330)  Labs:  Basename 04/28/12 1747 04/28/12 0525 04/27/12 2212 04/27/12 1649 04/27/12 1055 04/27/12 1045 04/27/12 0410  HGB -- 12.0 -- -- 13.0 -- --  HCT -- 35.2* -- -- 37.6 -- 37.6  PLT -- 159 -- -- 175 -- 166  APTT -- -- -- -- -- -- 30  LABPROT -- -- -- -- -- -- 13.1  INR -- -- -- -- -- -- 1.00  HEPARINUNFRC 0.69 -- -- -- -- -- --  CREATININE -- 0.58 -- -- 0.50 -- 0.50  CKTOTAL -- -- -- -- -- -- --  CKMB -- -- -- -- -- -- --  TROPONINI -- -- <0.30 <0.30 -- <0.30 --   Estimated Creatinine Clearance: 76.1 ml/min (by C-G formula based on Cr of 0.58) (by C-G formula based on Cr of 0.58).  Medical History: Past Medical History  Diagnosis Date  . Anxiety   . Atrial fibrillation   . GERD (gastroesophageal reflux disease)   . Complication of anesthesia      Nausea from versed  . PONV (postoperative nausea and vomiting)   . Family history of anesthesia complication     Sister and mother also had complications  . Anginal pain     On patient's right side, may be musculoskeletal  . Pneumonia     Viral pneumonia, unsure of time "long time ago"  . Heart murmur     Mitral valve prolapse, surgery in 2010  . CHF (congestive heart failure)     Prior to 2010 surgery  . Headache     From some medications  . Cancer     1998 Right breast cancer with lumpectomy and radiation therapy  . Arthritis     Some in neck and back, but "can't complain"  . SOB (shortness of breath) on exertion 04/28/2012   Assessment:   78 year old [name] admitted 04/27/12 with chest pain and shortness of breath.  Enzymes negative.  Shortness of breath on exertion noted likely due to atrial flutter.  To begin IV heparin.   Has had 3 doses of SQ heparin 5000 units, most recent at 7am today.   Platelet count low normal.   Cardiology note pending.  Her first heparin level has come back at 0.69 which is within the therapeutic range but the higher end.  She is without noted bleeding complications.  Goal of Therapy:  Heparin level 0.3-0.7 units/ml Monitor platelets by anticoagulation protocol: Yes   Plan:   Will decrease IV heparin to 850 units/hr.  Will check AM Heparin level ~ 10 hours after rate change.  Daily heparin level and CBC while on heparin.  Nadara Mustard, PharmD., MS Clinical Pharmacist Pager:  [phone] Thank you for allowing pharmacy to be part of this patients care team. 04/28/2012,7:02 PM

## 2012-04-28 NOTE — Consult Note (Addendum)
ELECTROPHYSIOLOGY CONSULT NOTE  Patient ID: [name] MRN: [id number], DOB/AGE: [birth year]   Admit date: 04/27/2012 Date of Consult: 04/28/2012  Primary Physician: Theressa Millard, MD Primary Cardiologist: Verdis Prime, MD Reason for Consultation: Atypical atrial flutter  History of Present Illness [name] is a pleasant 78 year old woman with rheumatic valvular disease s/p MV repair in 2010, atrial fibrillation s/p MAZE in 2010 and atypical atrial flutter who has been admitted with recurrent atrial flutter. She reports "racing, fluttering different than Afib" palpitations since Friday. Her symptoms worsened over the weekend which prompted her to seek medical attention. She denies CP, SOB, dizziness or syncope. She denies LE swelling, orthopnea or PND. She has been exercising with the [employer] program and has noticed exertional fatigue which she states is new as she did not have these symptoms with water aerobics. She denies any other symptoms or changes to her health recently. While here, she has had rapid atrial flutter intermittently with activity. She experienced a 6-second pause yesterday while napping. This was asymptomatic. Of note, she has taken flecainide and amiodarone in the past. She cannot tolerate amiodarone due to nausea, anorexia and insomnia.  Past Medical History Past Medical History  Diagnosis Date  . Anxiety   . Atrial fibrillation   . GERD (gastroesophageal reflux disease)   . Complication of anesthesia     Nausea from versed  . PONV (postoperative nausea and vomiting)   . Family history of anesthesia complication     Sister and mother also had complications  . Anginal pain     On patient's right side, may be musculoskeletal  . Pneumonia     Viral pneumonia, unsure of time "long time ago"  . Heart murmur     Mitral valve prolapse, surgery in 2010  . CHF (congestive heart failure)     Prior to 2010 surgery  . Headache     From some medications  . Cancer      1998 Right breast cancer with lumpectomy and radiation therapy  . Arthritis     Some in neck and back, but "can't complain"  . SOB (shortness of breath) on exertion 04/28/2012    Past Surgical History Past Surgical History  Procedure Date  . Mitral valve repair June 2010  . Cataract extraction   . Kidney cyst removal   . Breast surgery     1998 Lumpectomy in right breast  . Back surgery     Synovial cyst on spine, removed laproscopically in 2000?  [name] Eye surgery     Cataract Surgery, both eyes  . Appendectomy   . Tonsillectomy   . Cesarean section     Three C-Sections     Allergies/Intolerances Allergies  Allergen Reactions  . Amoxicillin Nausea And Vomiting  . Amoxicillin-Pot Clavulanate Nausea And Vomiting  . Codeine Nausea And Vomiting  . Diuretic (Buchu-Cornsilk-Ch Grass-Hydran)     Pharmacy record  . Iohexol Hives     Code: HIVES, Desc: **11/22/08 **NO REACTION W/ OMNI 300// PT S/P 3 CT SCANS W/ IV CM AND NO HIVES/MMS**PT STATED HIVES 16 YRS AGO. POSSIBLE IV DYE W/ CT/XRAY. MEDICATED W/ 50MG  OF BENEDRYL PO PRIOR TO SCAN, Onset Date: [id number]   . Midazolam Hcl Nausea And Vomiting  . Quinolones Other (See Comments)    Unknown reaction  . Sulfa Antibiotics Other (See Comments)    Flu like symptoms 'serum sickness'  . Thioxanthenes Other (See Comments)    Flu like symptoms 'serum sickness'  .  Valium Nausea And Vomiting  . Versed (Midazolam) Nausea And Vomiting    Inpatient Medications . aspirin EC  81 mg Oral QHS  . atenolol  25 mg Oral Daily  . cholecalciferol  1,000 Units Oral Daily  . influenza  inactive virus vaccine  0.5 mL Intramuscular Tomorrow-1000  . polyethylene glycol  17 g Oral Daily  . sodium chloride  3 mL Intravenous Q12H  . DISCONTD: heparin  5,000 Units Subcutaneous Q8H   Family History Non-contributory   Social History Social History  . Marital Status: Widowed   Social History Main Topics  . Smoking status: Former Games developer  .  Smokeless tobacco: No  . Alcohol Use: No  . Drug Use: No     Smoked one cigarette a day as a teen   Review of Systems General: No chills, fever, night sweats or weight changes  Cardiovascular: No chest pain, dyspnea on exertion, edema, orthopnea, palpitations, paroxysmal nocturnal dyspnea Dermatological: No rash, lesions or masses Respiratory: No cough, dyspnea Urologic: No hematuria, dysuria Abdominal: No nausea, vomiting, diarrhea, bright red blood per rectum, melena, or hematemesis Neurologic: No visual changes, weakness, changes in mental status All other systems reviewed and are otherwise negative except as noted above.  Physical Exam Blood pressure 90/66, pulse 104, temperature 97.6 F (36.4 C), temperature source Oral, resp. rate 18, height 5\' 3"  (1.6 m), weight 140 lb 10.5 oz (63.8 kg), SpO2 97.00%.  General: Well developed, well appearing 78 year old female in no acute distress. HEENT: Normocephalic, atraumatic. EOMs intact. Sclera nonicteric. Oropharynx clear.  Neck: Supple. No JVD. Lungs: Respirations regular and unlabored, CTA bilaterally. No wheezes, rales or rhonchi. Heart: S1, S2 present. No murmurs, rub, S3 or S4. Abdomen: Soft, non-tender, non-distended. BS present x 4 quadrants. No hepatosplenomegaly.  Extremities: No clubbing, cyanosis or edema. DP/PT/Radials 2+ and equal bilaterally. Psych: Normal affect. Neuro: Alert and oriented X 3. Moves all extremities spontaneously. Musculoskeletal: No kyphosis. Skin: Intact. Warm and dry. No rashes or petechiae in exposed areas.   Labs  [facility] 04/27/12 2212 04/27/12 1649 04/27/12 1045  CKTOTAL -- -- --  CKMB -- -- --  TROPONINI <0.30 <0.30 <0.30   Lab Results  Component Value Date   WBC 5.3 04/28/2012   HGB 12.0 04/28/2012   HCT 35.2* 04/28/2012   MCV 97.5 04/28/2012   PLT 159 04/28/2012    Lab 04/28/12 0525 04/27/12 0410  NA 133* --  K 3.6 --  CL 100 --  CO2 26 --  BUN 16 --  CREATININE 0.58 --    CALCIUM 8.8 --  PROT -- 7.6  BILITOT -- 0.5  ALKPHOS -- 66  ALT -- 13  AST -- 19  GLUCOSE 95 --   No components found with this basename: MAGNESIUM No components found with this basename: POCBNP:3 No results found for this basename: TSH,T4TOTAL,FREET3,T3FREE,THYROIDAB in the last 72 hours   Basename 04/27/12 0410  INR 1.00    Radiology/Studies Dg Chest Portable 1 View  04/27/2012  *RADIOLOGY REPORT*  Clinical Data: Atrial fibrillation.  PORTABLE CHEST - 1 VIEW  Comparison: 09/24/2011  Findings: Prominent soft tissue in the right cardiophrenic angle as compared to previous CT chest from 01/31/2012 and represents segmental elevation of left hemidiaphragm.  Mild cardiac enlargement with normal pulmonary vascularity.  Cardiac valve prosthesis.  Calcification and torsion of the aorta.  No focal airspace consolidation in the lungs.  No blunting of costophrenic angles.  No pneumothorax.  Mediastinal contours appear intact.  No significant change  since previous study.  IMPRESSION: No evidence of active pulmonary disease.   Original Report Authenticated By: Marlon Pel, M.D.     12-lead ECG on admission shows atypical atrial flutter at 95 bpm; QTc ~480 msec Telemetry shows persistent atypical atrial flutter with one 6-second pause yesterday afternoon  Assessment and Plan 1. Atypical atrial flutter 2. Bradycardia 3. Rheumatic valvular disease s/p MV repair  4. History of atrial fibrillation  [name] has recurrent atypical atrial flutter that is symptomatic. She has also experienced intermittent SVR with ~6 second pause which was asymptomatic while lying in bed. She thinks she has been in sinus rhythm for at least 2 years until now. If this is true, she may be best managed by DCCV to restore SR. If she then has recurrent/persistent atrial flutter, she will likely need AAD therapy in addition to rate controlling medications with close watch over her rate. Given that she has an atypical  atrial flutter, she is not a candidate for RF ablation. We have requested her most recent echo and serial ECGs in the last 3 years from Dr. Michaelle Copas office for our review. Further recommendations will be made pending review of the above-mentioned studies and her clinical course.  Signed, Rick Duff, PA-C 04/28/2012, 12:09 PM  Patient's history and physical examination is  as outlined above;  she has been amiodarone intolerant. She does not recall ever having received a 1C agent although Dr. Johney Frame no referred to the use  of flecainide.Her mitral valve surgery was accompanied by a maze operation  she does not have an S4 or heart rhythm is irregular.    As noted it is her understanding that she has been in sinus rhythm for most of the last 2 years. In the event that we can demonstrate that that is so, pursuing cardioversion without antiarrhythmic support seems to me to be them most appropriate.  In the event that she is wrong and she has had intermittent atrial arrhythmias, consideration could be given to using a 1C agent if her left ventricular function remains normal.  She is not anticoagulated. She will need long-term anticoagulation. I think we should probably consider her rheumatic atrial fibrillation and thus, I think probably NOACs R. not as clearly appropriate for her. I would recommend warfarin  To that end we will then #1. Obtain a 2-D echo #2 obtain old all echocardiograms #3 consider cardioversion in with TEE guidance or following 3 weeks of coumadinization  Have reviewed the above with the patient. She expresses understanding.

## 2012-04-28 NOTE — H&P (Signed)
[facility] History and Physical  [name] RUE:[id number] DOB: [birth year] DOA: 04/27/2012  Referring physician: Dr. Lavella Lemons PCP: No primary provider on file.  Specialists: Cardiology  Chief Complaint: SOB and chest discomfort  HPI: [name] is a 78 y.o. female  [name] is a pleasant 78 year old female  with a history of rheumatic heart disease, persistent atrial fibrillation, and severe mitral regurgitation status post recent mitral valve repair and cryo Cox-Maze by Dr. Tressie Stalker on December 14, 2008, who presents with symptomatic atypical atrial flutter.  Reports that for the last 2 days she has been having SOB with activity.  Has also felt palpitations.  She reports that she had been working out more vigorously at [employer] doing water sports and developed subsequently right sided chest discomfort.  Given her chest discomfort and 2 days of SOB with activity as well as palpitations patient decided to come to the ED for further evaluation.    In ID patient presented with HR of 130's reportedly and she was bolused with IV cardizem 10 mg.  Rate subsequently came down to 100's and we were called for further evaluation and recommendations.  Chest pain per ER physician was reproducible on palpation.   Review of Systems: The patient denies anorexia, fever, weight loss,, vision loss, decreased hearing, hoarseness, + right chest pain, syncope, dyspnea on exertion, peripheral edema, balance deficits, hemoptysis, abdominal pain, melena, hematochezia, severe indigestion/heartburn, hematuria, incontinence, genital sores, muscle weakness, suspicious skin lesions, transient blindness, difficulty walking, depression, unusual weight change, abnormal bleeding, enlarged lymph nodes, angioedema, and breast masses.    Past Medical History  Diagnosis Date  . Anxiety   . Atrial fibrillation   . GERD (gastroesophageal reflux disease)   . Complication of anesthesia     Nausea from versed  . PONV  (postoperative nausea and vomiting)   . Family history of anesthesia complication     Sister and mother also had complications  . Anginal pain     On patient's right side, may be musculoskeletal  . Pneumonia     Viral pneumonia, unsure of time "long time ago"  . Heart murmur     Mitral valve prolapse, surgery in 2010  . CHF (congestive heart failure)     Prior to 2010 surgery  . Headache     From some medications  . Cancer     1998 Right breast cancer with lumpectomy and radiation therapy  . Arthritis     Some in neck and back, but "can't complain"   Past Surgical History  Procedure Date  . Mitral valve repair June 2010  . Cataract extraction   . Kidney cyst removal   . Breast surgery     1998 Lumpectomy in right breast  . Back surgery     Synovial cyst on spine, removed laproscopically in 2000?  [name] Eye surgery     Cataract Surgery, both eyes  . Appendectomy   . Tonsillectomy   . Cesarean section     Three C-Sections   Social History:  reports that she has quit smoking. She does not have any smokeless tobacco history on file. She reports that she does not drink alcohol or use illicit drugs. Lives at home  Can patient participate in ADLs? yes  Allergies  Allergen Reactions  . Amoxicillin Nausea And Vomiting  . Amoxicillin-Pot Clavulanate Nausea And Vomiting  . Codeine Nausea And Vomiting  . Diuretic (Buchu-Cornsilk-Ch Grass-Hydran)     Pharmacy record  . Iohexol Hives  Code: HIVES, Desc: **11/22/08 **NO REACTION W/ OMNI 300// PT S/P 3 CT SCANS W/ IV CM AND NO HIVES/MMS**PT STATED HIVES 16 YRS AGO. POSSIBLE IV DYE W/ CT/XRAY. MEDICATED W/ 50MG  OF BENEDRYL PO PRIOR TO SCAN, Onset Date: [id number]   . Midazolam Hcl Nausea And Vomiting  . Quinolones Other (See Comments)    Unknown reaction  . Sulfa Antibiotics Other (See Comments)    Flu like symptoms 'serum sickness'  . Thioxanthenes Other (See Comments)    Flu like symptoms 'serum sickness'  . Valium Nausea And  Vomiting  . Versed (Midazolam) Nausea And Vomiting    Family History  Problem Relation Age of Onset  . Other Sister   none reported  Prior to Admission medications   Medication Sig Start Date End Date Taking? Authorizing Provider  aspirin EC 81 MG tablet Take 81 mg by mouth at bedtime.    Yes Historical Provider, MD  atenolol (TENORMIN) 25 MG tablet Take 12.5 mg by mouth at bedtime.   Yes Historical Provider, MD  cholecalciferol (VITAMIN D) 1000 UNITS tablet Take 1,000 Units by mouth daily.   Yes Historical Provider, MD  co-enzyme Q-10 50 MG capsule Take 50 mg by mouth daily.   Yes Historical Provider, MD  famotidine (PEPCID) 20 MG tablet Take 20 mg by mouth at bedtime as needed. Acid reflux   Yes Historical Provider, MD  fish oil-omega-3 fatty acids 1000 MG capsule Take 1 g by mouth daily.   Yes Historical Provider, MD  Flaxseed, Linseed, (FLAX SEEDS PO) Take 1 tablet by mouth daily.   Yes Historical Provider, MD  LORazepam (ATIVAN) 0.5 MG tablet Take 0.5 mg by mouth at bedtime as needed. For sleep   Yes Historical Provider, MD  neomycin-polymyxin-hydrocortisone (CORTISPORIN) otic solution Place 4-5 drops into the right ear 2 (two) times daily. For 5 days   Yes Historical Provider, MD  OVER THE COUNTER MEDICATION Take 1 each by mouth daily. Hemp heart seeds   Yes Historical Provider, MD  OVER THE COUNTER MEDICATION Take 1 each by mouth daily. Greens plus supplement   Yes Historical Provider, MD  polyvinyl alcohol (LIQUIFILM TEARS) 1.4 % ophthalmic solution Place 1 drop into both eyes as needed. For dry eyes   Yes Historical Provider, MD  sodium chloride (OCEAN) 0.65 % nasal spray Place 1 spray into the nose as needed. Dry nose   Yes Historical Provider, MD  vitamin C (ASCORBIC ACID) 500 MG tablet Take 500 mg by mouth daily.   Yes Historical Provider, MD   Physical Exam: Filed Vitals:   04/27/12 1010 04/27/12 1500 04/27/12 2052 04/28/12 0410  BP: 136/82 106/74 95/63 98/69   Pulse: 104 82  72 70  Temp: 97.9 F (36.6 C) 97.8 F (36.6 C) 97.7 F (36.5 C) 97.9 F (36.6 C)  TempSrc: Oral Oral Oral Oral  Resp: 18 17 19 18   Height: 5\' 3"  (1.6 m)     Weight: 63.8 kg (140 lb 10.5 oz)     SpO2: 97% 98% 100% 98%     General:  Pt in NAD, A and O x 3  Eyes: EOMI, nonicteric  ENT: normal exterior appearance, moist mucus membranes  Neck: supple, no goiter  Cardiovascular: S1 and S2 with ectopic beat, no gallops  Respiratory: CTA BL , no wheezes  Abdomen: Soft, NT, ND  Skin: warm and dry, no diaphoresis  Musculoskeletal: pain with palpation over right upper chest, no cyanosis  Psychiatric: mood and affect appropriate  Neurologic: answers questions  appropriately, moves all extremities  Labs on Admission:  Basic Metabolic Panel:  Lab 04/28/12 4098 04/27/12 1055 04/27/12 0410  NA 133* -- 134*  K 3.6 -- 3.9  CL 100 -- 99  CO2 26 -- 24  GLUCOSE 95 -- 102*  BUN 16 -- 18  CREATININE 0.58 0.50 0.50  CALCIUM 8.8 -- 9.5  MG -- -- 2.1  PHOS -- -- --   Liver Function Tests:  Lab 04/27/12 0410  AST 19  ALT 13  ALKPHOS 66  BILITOT 0.5  PROT 7.6  ALBUMIN 3.8   No results found for this basename: LIPASE:5,AMYLASE:5 in the last 168 hours No results found for this basename: AMMONIA:5 in the last 168 hours CBC:  Lab 04/28/12 0525 04/27/12 1055 04/27/12 0410  WBC 5.3 6.1 6.2  NEUTROABS -- -- --  HGB 12.0 13.0 13.0  HCT 35.2* 37.6 37.6  MCV 97.5 97.7 97.2  PLT 159 175 166   Cardiac Enzymes:  Lab 04/27/12 2212 04/27/12 1649 04/27/12 1045  CKTOTAL -- -- --  CKMB -- -- --  CKMBINDEX -- -- --  TROPONINI <0.30 <0.30 <0.30    BNP (last 3 results) No results found for this basename: PROBNP:3 in the last 8760 hours CBG: No results found for this basename: GLUCAP:5 in the last 168 hours  Radiological Exams on Admission: Dg Chest Portable 1 View  04/27/2012  *RADIOLOGY REPORT*  Clinical Data: Atrial fibrillation.  PORTABLE CHEST - 1 VIEW  Comparison:  09/24/2011  Findings: Prominent soft tissue in the right cardiophrenic angle as compared to previous CT chest from 01/31/2012 and represents segmental elevation of left hemidiaphragm.  Mild cardiac enlargement with normal pulmonary vascularity.  Cardiac valve prosthesis.  Calcification and torsion of the aorta.  No focal airspace consolidation in the lungs.  No blunting of costophrenic angles.  No pneumothorax.  Mediastinal contours appear intact.  No significant change since previous study.  IMPRESSION: No evidence of active pulmonary disease.   Original Report Authenticated By: Marlon Pel, M.D.     EKG: Independently reviewed. Evaluation looks like sinus rhythm with multiple pac's, No new St elevation or depression  Assessment/Plan Active Problems: Chest Pain SOB on exertion H/o atrial flutter   1. Chest Pain - Most likely musculoskeletal  - given cardiac history would favor tylenol - Cycle cardiac enzymes. - EKG with no St elevations or depressions  2. SOB on exertion - Likely related to atrial flutter - Discussed with Dr. Johney Frame and plan will be to increased B blocker and reassess  3. H/o Atrial flutter - Likely causing number 2 and will increase B blocker as mentioned above. - Monitor patient closely on telemetry - Obtain repeat EKG next am.   Code Status: Full Family Communication: No family at bedside Disposition Plan: Pending hospital course.   Time spent: > 55 minutes  Penny Pia [facility] Pager [phone]  If 7PM-7AM, please contact night-coverage [email or web address] Password [email or web address] 04/28/2012, 9:05 AM

## 2012-04-28 NOTE — Progress Notes (Signed)
ANTICOAGULATION CONSULT NOTE - Initial Consult  Pharmacy Consult for Heparin Indication: atrial fibrillation/flutter  Allergies  Allergen Reactions  . Amoxicillin Nausea And Vomiting  . Amoxicillin-Pot Clavulanate Nausea And Vomiting  . Codeine Nausea And Vomiting  . Diuretic (Buchu-Cornsilk-Ch Grass-Hydran)     Pharmacy record  . Iohexol Hives     Code: HIVES, Desc: **11/22/08 **NO REACTION W/ OMNI 300// PT S/P 76 CT SCANS W/ IV CM AND NO HIVES/MMS**PT STATED HIVES 16 YRS AGO. POSSIBLE IV DYE W/ CT/XRAY. MEDICATED W/ 50MG  OF BENEDRYL PO PRIOR TO SCAN, Onset Date: [id number]   . Midazolam Hcl Nausea And Vomiting  . Quinolones Other (See Comments)    Unknown reaction  . Sulfa Antibiotics Other (See Comments)    Flu like symptoms 'serum sickness'  . Thioxanthenes Other (See Comments)    Flu like symptoms 'serum sickness'  . Valium Nausea And Vomiting  . Versed (Midazolam) Nausea And Vomiting    Patient Measurements: Height: 5\' 3"  (160 cm) Weight: 140 lb 10.5 oz (63.8 kg) IBW/kg (Calculated) : 52.4  Heparin Dosing Weight: 64 kg  Vital Signs: Temp: 97.6 F (36.4 C) (10/21 0820) Temp src: Oral (10/21 0820) BP: 90/66 mmHg (10/21 0935) Pulse Rate: 104  (10/21 0935)  Labs:  Basename 04/28/12 0525 04/27/12 2212 04/27/12 1649 04/27/12 1055 04/27/12 1045 04/27/12 0410  HGB 12.0 -- -- 13.0 -- --  HCT 35.2* -- -- 37.6 -- 37.6  PLT 159 -- -- 175 -- 166  APTT -- -- -- -- -- 30  LABPROT -- -- -- -- -- 13.1  INR -- -- -- -- -- 1.00  HEPARINUNFRC -- -- -- -- -- --  CREATININE 0.58 -- -- 0.50 -- 0.50  CKTOTAL -- -- -- -- -- --  CKMB -- -- -- -- -- --  TROPONINI -- <0.30 <0.30 -- <0.30 --    Estimated Creatinine Clearance: 47.1 ml/min (by C-G formula based on Cr of 0.58).   Medical History: Past Medical History  Diagnosis Date  . Anxiety   . Atrial fibrillation   . GERD (gastroesophageal reflux disease)   . Complication of anesthesia     Nausea from versed  . PONV  (postoperative nausea and vomiting)   . Family history of anesthesia complication     Sister and mother also had complications  . Anginal pain     On patient's right side, may be musculoskeletal  . Pneumonia     Viral pneumonia, unsure of time "long time ago"  . Heart murmur     Mitral valve prolapse, surgery in 2010  . CHF (congestive heart failure)     Prior to 2010 surgery  . Headache     From some medications  . Cancer     1998 Right breast cancer with lumpectomy and radiation therapy  . Arthritis     Some in neck and back, but "can't complain"  . SOB (shortness of breath) on exertion 04/28/2012   Assessment:   [name] admitted 04/27/12 with chest pain and shortness of breath.  Enzymes negative.  Shortness of breath on exertion noted likely due to atrial flutter.  To begin IV heparin.   Has had 3 doses of SQ heparin 5000 units, most recent at 7am today.   Platelet count low normal.   Cardiology note pending.  Goal of Therapy:  Heparin level 0.3-0.7 units/ml Monitor platelets by anticoagulation protocol: Yes   Plan:   No heparin bolus, with recent SQ dose.  Will begin  heparin drip at 950 units/hr.  Heparin level ~ 6 hours after drip begins.  Daily heparin level and CBC while on heparin.  Dennie Fetters, RPh Pager: [phone] 04/28/2012,11:08 AM

## 2012-04-28 NOTE — Progress Notes (Signed)
[facility] PROGRESS NOTE  [name]:[id number] DOB: 1926/02/13 DOA: 04/27/2012 PCP: No primary provider on file.  Assessment/Plan: 1. Chest Pain - Most likely musculoskeletal  - given cardiac history would favor tylenol for chest discomfort - CE x 3 negative - EKG with no St elevations or depressions with atrial flutter  2. SOB on exertion  - Likely related to atrial flutter  - Will consult [facility] today 04/28/12.  3. H/o Atrial flutter  - Likely causing number 2, B blocker increased on admission.  - Monitor patient closely on telemetry    Code Status: Full Family Communication: No family at bedside Disposition Plan: Pending disposition from cardiologist.   Consultants:  [facility]  Procedures:  None  Antibiotics:  None  HPI/Subjective: Patient got up to wash herself and her heart rate went back up to 140's reportedly.  Once she layed down her heart rate came back down.  She is concerned because she wants to live an active lifestyle.  She denies any shortness of breath, nausea, emesis, diaphoresis, or chest pain.  Objective: Filed Vitals:   04/27/12 1010 04/27/12 1500 04/27/12 2052 04/28/12 0410  BP: 136/82 106/74 95/63 98/69   Pulse: 104 82 72 70  Temp: 97.9 F (36.6 C) 97.8 F (36.6 C) 97.7 F (36.5 C) 97.9 F (36.6 C)  TempSrc: Oral Oral Oral Oral  Resp: 18 17 19 18   Height: 5\' 3"  (1.6 m)     Weight: 63.8 kg (140 lb 10.5 oz)     SpO2: 97% 98% 100% 98%    Intake/Output Summary (Last 24 hours) at 04/28/12 0924 Last data filed at 04/28/12 0300  Gross per 24 hour  Intake      0 ml  Output    300 ml  Net   -300 ml   Filed Weights   04/27/12 1010  Weight: 63.8 kg (140 lb 10.5 oz)    Exam:  General: Patient laying in bed smiling Cardiovascular: S1 and S2 with ectopic beat, no gallops  Respiratory: CTA BL , no wheezes  Abdomen: Soft, NT, ND Skin: warm and dry, no diaphoresis  Musculoskeletal: pain with palpation over  right upper chest, no cyanosis  Psychiatric: mood and affect appropriate  Neurologic: answers questions appropriately, moves all extremities   Data Reviewed: Basic Metabolic Panel:  Lab 04/28/12 8119 04/27/12 1055 04/27/12 0410  NA 133* -- 134*  K 3.6 -- 3.9  CL 100 -- 99  CO2 26 -- 24  GLUCOSE 95 -- 102*  BUN 16 -- 18  CREATININE 0.58 0.50 0.50  CALCIUM 8.8 -- 9.5  MG -- -- 2.1  PHOS -- -- --   Liver Function Tests:  Lab 04/27/12 0410  AST 19  ALT 13  ALKPHOS 66  BILITOT 0.5  PROT 7.6  ALBUMIN 3.8   No results found for this basename: LIPASE:5,AMYLASE:5 in the last 168 hours No results found for this basename: AMMONIA:5 in the last 168 hours CBC:  Lab 04/28/12 0525 04/27/12 1055 04/27/12 0410  WBC 5.3 6.1 6.2  NEUTROABS -- -- --  HGB 12.0 13.0 13.0  HCT 35.2* 37.6 37.6  MCV 97.5 97.7 97.2  PLT 159 175 166   Cardiac Enzymes:  Lab 04/27/12 2212 04/27/12 1649 04/27/12 1045  CKTOTAL -- -- --  CKMB -- -- --  CKMBINDEX -- -- --  TROPONINI <0.30 <0.30 <0.30   BNP (last 3 results) No results found for this basename: PROBNP:3 in the last 8760 hours CBG: No results  found for this basename: GLUCAP:5 in the last 168 hours  No results found for this or any previous visit (from the past 240 hour(s)).   Studies: Dg Chest Portable 1 View  04/27/2012  *RADIOLOGY REPORT*  Clinical Data: Atrial fibrillation.  PORTABLE CHEST - 1 VIEW  Comparison: 09/24/2011  Findings: Prominent soft tissue in the right cardiophrenic angle as compared to previous CT chest from 01/31/2012 and represents segmental elevation of left hemidiaphragm.  Mild cardiac enlargement with normal pulmonary vascularity.  Cardiac valve prosthesis.  Calcification and torsion of the aorta.  No focal airspace consolidation in the lungs.  No blunting of costophrenic angles.  No pneumothorax.  Mediastinal contours appear intact.  No significant change since previous study.  IMPRESSION: No evidence of active  pulmonary disease.   Original Report Authenticated By: Marlon Pel, M.D.     Scheduled Meds:   . aspirin EC  81 mg Oral QHS  . atenolol  25 mg Oral Daily  . cholecalciferol  1,000 Units Oral Daily  . heparin  5,000 Units Subcutaneous Q8H  . influenza  inactive virus vaccine  0.5 mL Intramuscular Tomorrow-1000  . polyethylene glycol  17 g Oral Daily  . sodium chloride  3 mL Intravenous Q12H  . DISCONTD: sodium chloride  3 mL Intravenous Q12H   Continuous Infusions:   Principal Problem:  *Chest pain Active Problems:  SOB (shortness of breath) on exertion  History of atrial flutter    Time spent: > 35 minutes.    [name]  [facility] Pager [phone]. If 8PM-8AM, please contact night-coverage at [email or web address], password [facility] 04/28/2012, 9:24 AM  LOS: 1 day

## 2012-04-28 NOTE — Consult Note (Signed)
Admit date: 04/27/2012 Referring Physician  Dr. Cena Benton Primary Physician  Dr. Earl Gala Primary Cardiologist  Dr. Verdis Prime Reason for Consultation  Atrial flutter  HPI: 78 y/o who has had a Maze and MVR in 2010.  She has had some nausea over the past one week.  Early Saturday morning, she noted that her heart was out of rhythm.  She could feel that her heart was beating fast.  SHe has not had lightheadedness or syncope.  She does not report chest pain at this time.    I reviewed her prior ECG over the past 2 years.  SHe has been in NSR during that time.  She has been managed on low dose atenolol for quite a while.  She notes that she had been on Amiodarone in teh past and did not tolerate this medication.    She remains very active despite her age.  She can tell the difference in her energy level when she is out of rhythm.  She is interested in therapy to restore sinus rhythm and wonders about a cardioversion; however, it appears that she has been out of rhythm for more than  48 hours.       PMH:   Past Medical History  Diagnosis Date  . Anxiety   . Atrial fibrillation   . GERD (gastroesophageal reflux disease)   . Complication of anesthesia     Nausea from versed  . PONV (postoperative nausea and vomiting)   . Family history of anesthesia complication     Sister and mother also had complications  . Anginal pain     On patient's right side, may be musculoskeletal  . Pneumonia     Viral pneumonia, unsure of time "long time ago"  . Heart murmur     Mitral valve prolapse, surgery in 2010  . CHF (congestive heart failure)     Prior to 2010 surgery  . Headache     From some medications  . Cancer     1998 Right breast cancer with lumpectomy and radiation therapy  . Arthritis     Some in neck and back, but "can't complain"  . SOB (shortness of breath) on exertion 04/28/2012     PSH:   Past Surgical History  Procedure Date  . Mitral valve repair June 2010  . Cataract extraction     . Kidney cyst removal   . Breast surgery     1998 Lumpectomy in right breast  . Back surgery     Synovial cyst on spine, removed laproscopically in 2000?  [name] Eye surgery     Cataract Surgery, both eyes  . Appendectomy   . Tonsillectomy   . Cesarean section     Three C-Sections    Allergies:  Amoxicillin; Amoxicillin-pot clavulanate; Codeine; Diuretic; Iohexol; Midazolam hcl; Quinolones; Sulfa antibiotics; Thioxanthenes; Valium; and Versed Prior to Admit Meds:   Prescriptions prior to admission  Medication Sig Dispense Refill  . aspirin EC 81 MG tablet Take 81 mg by mouth at bedtime.       [name] atenolol (TENORMIN) 25 MG tablet Take 12.5 mg by mouth at bedtime.      . cholecalciferol (VITAMIN D) 1000 UNITS tablet Take 1,000 Units by mouth daily.      [name] co-enzyme Q-10 50 MG capsule Take 50 mg by mouth daily.      . famotidine (PEPCID) 20 MG tablet Take 20 mg by mouth at bedtime as needed. Acid reflux      . fish  oil-omega-3 fatty acids 1000 MG capsule Take 1 g by mouth daily.      . Flaxseed, Linseed, (FLAX SEEDS PO) Take 1 tablet by mouth daily.      [name] LORazepam (ATIVAN) 0.5 MG tablet Take 0.5 mg by mouth at bedtime as needed. For sleep      . neomycin-polymyxin-hydrocortisone (CORTISPORIN) otic solution Place 4-5 drops into the right ear 2 (two) times daily. For 5 days      . OVER THE COUNTER MEDICATION Take 1 each by mouth daily. Hemp heart seeds      . OVER THE COUNTER MEDICATION Take 1 each by mouth daily. Greens plus supplement      . polyvinyl alcohol (LIQUIFILM TEARS) 1.4 % ophthalmic solution Place 1 drop into both eyes as needed. For dry eyes      . sodium chloride (OCEAN) 0.65 % nasal spray Place 1 spray into the nose as needed. Dry nose      . vitamin C (ASCORBIC ACID) 500 MG tablet Take 500 mg by mouth daily.       Fam HX:    Family History  Problem Relation Age of Onset  . Other Sister    Social HX:    History   Social History  . Marital Status: Widowed    Spouse  Name: N/A    Number of Children: N/A  . Years of Education: N/A   Occupational History  . Not on file.   Social History Main Topics  . Smoking status: Former Games developer  . Smokeless tobacco: Not on file  . Alcohol Use: No  . Drug Use: No  . Sexually Active: No     Smoked one cigarette a day as a teen   Other Topics Concern  . Not on file   Social History Narrative  . No narrative on file     ROS:  All 11 ROS were addressed and are negative except what is stated in the HPI  Physical Exam: Blood pressure 90/66, pulse 104, temperature 97.6 F (36.4 C), temperature source Oral, resp. rate 18, height 5\' 3"  (1.6 m), weight 63.8 kg (140 lb 10.5 oz), SpO2 97.00%.    General: Well developed, well nourished, in no acute distress Head: Eyes PERRLA, No xanthomas.   Normal cephalic and atramatic  Lungs:  Clear bilaterally to auscultation and percussion. Heart:  Irregularly irregular, S1S2 Abdomen: Bowel sounds are positive, abdomen soft and non-tender without masses or                  Hernia's noted. Msk:  Back normal, normal gait. Normal strength and tone for age. Extremities:   No edema. Neuro: Alert and oriented X 3. Psych:  Normal affect, responds appropriately    Labs:   Lab Results  Component Value Date   WBC 5.3 04/28/2012   HGB 12.0 04/28/2012   HCT 35.2* 04/28/2012   MCV 97.5 04/28/2012   PLT 159 04/28/2012    Lab 04/28/12 0525 04/27/12 0410  NA 133* --  K 3.6 --  CL 100 --  CO2 26 --  BUN 16 --  CREATININE 0.58 --  CALCIUM 8.8 --  PROT -- 7.6  BILITOT -- 0.5  ALKPHOS -- 66  ALT -- 13  AST -- 19  GLUCOSE 95 --   No results found for this basename: PTT   Lab Results  Component Value Date   INR 1.00 04/27/2012   INR 2.2* 01/06/2009   INR 3.0* 01/05/2009   Lab  Results  Component Value Date   CKTOTAL 34 01/04/2009   CKMB 2.7 01/04/2009   TROPONINI <0.30 04/27/2012     No results found for this basename: CHOL   No results found for this basename: HDL     No results found for this basename: LDLCALC   No results found for this basename: TRIG   No results found for this basename: CHOLHDL   No results found for this basename: LDLDIRECT      Radiology:  Dg Chest Portable 1 View  04/27/2012  *RADIOLOGY REPORT*  Clinical Data: Atrial fibrillation.  PORTABLE CHEST - 1 VIEW  Comparison: 09/24/2011  Findings: Prominent soft tissue in the right cardiophrenic angle as compared to previous CT chest from 01/31/2012 and represents segmental elevation of left hemidiaphragm.  Mild cardiac enlargement with normal pulmonary vascularity.  Cardiac valve prosthesis.  Calcification and torsion of the aorta.  No focal airspace consolidation in the lungs.  No blunting of costophrenic angles.  No pneumothorax.  Mediastinal contours appear intact.  No significant change since previous study.  IMPRESSION: No evidence of active pulmonary disease.   Original Report Authenticated By: Marlon Pel, M.D.     EKG:  Atypical atrial flutter; rate controlled  ASSESSMENT: Atypical atrial flutter, fatigue  PLAN:  We did discuss cardioversion, but the patient has likely been in atrial flutter for longer than 48 hours.  She has not been anticoagulated with Coumadin.  She only takes aspirin.  We will start IV heparin at this time.  I explained to her that if her symptoms cannot be managed with medication, we could consider a TEE cardioversion.  Otherwise, could consider cardioversion in 4 weeks.  I discussed the case with Dr. Katrinka Blazing as well.  We will obtain electrophysiology opinion.  Upon review of the office record, she has been in sinus rhythm over the past few years when ECGs were checked.  Corky Crafts., MD  04/28/2012  1:47 PM

## 2012-04-29 ENCOUNTER — Encounter (HOSPITAL_COMMUNITY)
Admission: EM | Disposition: A | Discharge status: Home / Self Care | Payer: Self-pay | Source: Home / Self Care | Attending: Family Medicine

## 2012-04-29 ENCOUNTER — Encounter (HOSPITAL_COMMUNITY): Payer: Self-pay | Admitting: *Deleted

## 2012-04-29 DIAGNOSIS — I059 Rheumatic mitral valve disease, unspecified: Secondary | ICD-10-CM | POA: Diagnosis present

## 2012-04-29 DIAGNOSIS — R079 Chest pain, unspecified: Secondary | ICD-10-CM | POA: Diagnosis not present

## 2012-04-29 DIAGNOSIS — I4891 Unspecified atrial fibrillation: Secondary | ICD-10-CM | POA: Diagnosis not present

## 2012-04-29 DIAGNOSIS — K219 Gastro-esophageal reflux disease without esophagitis: Secondary | ICD-10-CM | POA: Diagnosis not present

## 2012-04-29 DIAGNOSIS — I5189 Other ill-defined heart diseases: Secondary | ICD-10-CM | POA: Diagnosis present

## 2012-04-29 DIAGNOSIS — Z853 Personal history of malignant neoplasm of breast: Secondary | ICD-10-CM | POA: Diagnosis not present

## 2012-04-29 DIAGNOSIS — F411 Generalized anxiety disorder: Secondary | ICD-10-CM | POA: Diagnosis present

## 2012-04-29 DIAGNOSIS — Z9010 Acquired absence of unspecified breast and nipple: Secondary | ICD-10-CM | POA: Diagnosis not present

## 2012-04-29 DIAGNOSIS — Z23 Encounter for immunization: Secondary | ICD-10-CM | POA: Diagnosis not present

## 2012-04-29 DIAGNOSIS — Z79899 Other long term (current) drug therapy: Secondary | ICD-10-CM | POA: Diagnosis not present

## 2012-04-29 DIAGNOSIS — I38 Endocarditis, valve unspecified: Secondary | ICD-10-CM | POA: Diagnosis present

## 2012-04-29 DIAGNOSIS — R0602 Shortness of breath: Secondary | ICD-10-CM | POA: Diagnosis not present

## 2012-04-29 DIAGNOSIS — Z5181 Encounter for therapeutic drug level monitoring: Secondary | ICD-10-CM | POA: Diagnosis not present

## 2012-04-29 DIAGNOSIS — Z923 Personal history of irradiation: Secondary | ICD-10-CM | POA: Diagnosis not present

## 2012-04-29 DIAGNOSIS — Z7982 Long term (current) use of aspirin: Secondary | ICD-10-CM | POA: Diagnosis not present

## 2012-04-29 DIAGNOSIS — Z7901 Long term (current) use of anticoagulants: Secondary | ICD-10-CM | POA: Diagnosis not present

## 2012-04-29 DIAGNOSIS — I509 Heart failure, unspecified: Secondary | ICD-10-CM | POA: Diagnosis present

## 2012-04-29 DIAGNOSIS — R0789 Other chest pain: Secondary | ICD-10-CM | POA: Diagnosis present

## 2012-04-29 DIAGNOSIS — Z8679 Personal history of other diseases of the circulatory system: Secondary | ICD-10-CM | POA: Diagnosis not present

## 2012-04-29 DIAGNOSIS — I4892 Unspecified atrial flutter: Secondary | ICD-10-CM | POA: Diagnosis present

## 2012-04-29 DIAGNOSIS — Z87898 Personal history of other specified conditions: Secondary | ICD-10-CM | POA: Diagnosis not present

## 2012-04-29 DIAGNOSIS — Z66 Do not resuscitate: Secondary | ICD-10-CM | POA: Diagnosis present

## 2012-04-29 HISTORY — PX: TEE WITHOUT CARDIOVERSION: SHX5443

## 2012-04-29 HISTORY — PX: CARDIOVERSION: SHX1299

## 2012-04-29 LAB — CBC
HCT: 34.8 % — ABNORMAL LOW (ref 36.0–46.0)
Hemoglobin: 12.3 g/dL (ref 12.0–15.0)
MCH: 34 pg (ref 26.0–34.0)
MCHC: 35.3 g/dL (ref 30.0–36.0)
MCV: 96.1 fL (ref 78.0–100.0)
Platelets: 145 10*3/uL — ABNORMAL LOW (ref 150–400)
RBC: 3.62 MIL/uL — ABNORMAL LOW (ref 3.87–5.11)
RDW: 12.3 % (ref 11.5–15.5)
WBC: 4.8 10*3/uL (ref 4.0–10.5)

## 2012-04-29 LAB — HEPARIN LEVEL (UNFRACTIONATED): Heparin Unfractionated: 0.45 IU/mL (ref 0.30–0.70)

## 2012-04-29 SURGERY — ECHOCARDIOGRAM, TRANSESOPHAGEAL
Anesthesia: Moderate Sedation

## 2012-04-29 MED ORDER — PATIENT'S GUIDE TO USING COUMADIN BOOK
Freq: Once | Status: DC
  Filled 2012-04-29: qty 1

## 2012-04-29 MED ORDER — WARFARIN - PHARMACIST DOSING INPATIENT: Freq: Every day | Status: DC

## 2012-04-29 MED ORDER — DIPHENHYDRAMINE HCL 50 MG/ML IJ SOLN
INTRAMUSCULAR | Status: AC
  Filled 2012-04-29: qty 1

## 2012-04-29 MED ORDER — RIVAROXABAN 20 MG PO TABS: 20.0000 mg | ORAL_TABLET | Freq: Every day | ORAL | Status: DC

## 2012-04-29 MED ORDER — LIDOCAINE VISCOUS 2 % MT SOLN
OROMUCOSAL | Status: DC | PRN
  Administered 2012-04-29: 5 mL via OROMUCOSAL

## 2012-04-29 MED ORDER — FENTANYL CITRATE 0.05 MG/ML IJ SOLN
INTRAMUSCULAR | Status: AC
  Filled 2012-04-29: qty 2

## 2012-04-29 MED ORDER — RIVAROXABAN 15 MG PO TABS
15.0000 mg | ORAL_TABLET | Freq: Every day | ORAL | Status: DC
  Administered 2012-04-29 – 2012-04-30 (×2): 15 mg via ORAL
  Filled 2012-04-29 (×3): qty 1

## 2012-04-29 MED ORDER — BUTAMBEN-TETRACAINE-BENZOCAINE 2-2-14 % EX AERO
INHALATION_SPRAY | CUTANEOUS | Status: DC | PRN
  Administered 2012-04-29: 1 via TOPICAL

## 2012-04-29 MED ORDER — SODIUM CHLORIDE 0.9 % IV SOLN: INTRAVENOUS | Status: DC

## 2012-04-29 MED ORDER — SODIUM CHLORIDE 0.9 % IV SOLN
INTRAVENOUS | Status: DC | PRN
  Administered 2012-04-29: 14:00:00 via INTRAVENOUS

## 2012-04-29 MED ORDER — FENTANYL CITRATE 0.05 MG/ML IJ SOLN
INTRAMUSCULAR | Status: DC | PRN
  Administered 2012-04-29 (×3): 25 ug via INTRAVENOUS

## 2012-04-29 MED ORDER — POLYETHYLENE GLYCOL 3350 17 G PO PACK
17.0000 g | PACK | Freq: Two times a day (BID) | ORAL | Status: DC
  Administered 2012-04-29 – 2012-05-01 (×4): 17 g via ORAL
  Filled 2012-04-29 (×5): qty 1

## 2012-04-29 MED ORDER — WARFARIN VIDEO: Freq: Once | Status: DC

## 2012-04-29 MED ORDER — WARFARIN SODIUM 5 MG PO TABS
5.0000 mg | ORAL_TABLET | Freq: Once | ORAL | Status: DC
  Filled 2012-04-29: qty 1

## 2012-04-29 MED ORDER — LIDOCAINE VISCOUS 2 % MT SOLN
OROMUCOSAL | Status: AC
  Filled 2012-04-29: qty 15

## 2012-04-29 MED ORDER — DILTIAZEM HCL 30 MG PO TABS
30.0000 mg | ORAL_TABLET | Freq: Four times a day (QID) | ORAL | Status: DC
  Administered 2012-04-29 – 2012-04-30 (×3): 30 mg via ORAL
  Filled 2012-04-29 (×7): qty 1

## 2012-04-29 NOTE — Interval H&P Note (Signed)
History and Physical Interval Note:  04/29/2012 1:17 PM  [name]  has presented today for surgery, with the diagnosis of a-fib  The various methods of treatment have been discussed with the patient and family. After consideration of risks, benefits and other options for treatment, the patient has consented to  Procedure(s) (LRB) with comments: TRANSESOPHAGEAL ECHOCARDIOGRAM (TEE) (N/A) CARDIOVERSION (N/A) as a surgical intervention .  The patient's history has been reviewed, patient examined, no change in status, stable for surgery.  I have reviewed the patient's chart and labs.  Questions were answered to the patient's satisfaction.     TURNER,TRACI R

## 2012-04-29 NOTE — Progress Notes (Signed)
Need to have antiarrhythmic therapy and anticoagulation. Will use Flecainide and Xarelto. Dr. Mayford Knife trying to arrange TEE cardioversion today.

## 2012-04-29 NOTE — Anesthesia Preprocedure Evaluation (Signed)
Anesthesia Evaluation  Patient identified by MRN, date of birth, ID band Patient awake    Reviewed: Allergy & Precautions, H&P , NPO status , Patient's Chart, lab work & pertinent test results  History of Anesthesia Complications (+) PONV  Airway Mallampati: III TM Distance: >3 FB Neck ROM: Limited  Mouth opening: Limited Mouth Opening  Dental  (+) Teeth Intact and Dental Advisory Given   Pulmonary shortness of breath, pneumonia -, resolved,  breath sounds clear to auscultation        Cardiovascular - angina+CHF Atrial Fibrillation + Valvular Problems/Murmurs MR Rhythm:Irregular Rate:Tachycardia     Neuro/Psych  Headaches (migranes controlled after discovery of food allergies), Anxiety    GI/Hepatic Neg liver ROS, GERD-  Medicated and Controlled,  Endo/Other  negative endocrine ROS  Renal/GU negative Renal ROS     Musculoskeletal   Abdominal Normal abdominal exam  (+)   Peds  Hematology negative hematology ROS (+)   Anesthesia Other Findings   Reproductive/Obstetrics                           Anesthesia Physical Anesthesia Plan  ASA: III  Anesthesia Plan: General   Post-op Pain Management:    Induction: Intravenous  Airway Management Planned: Mask  Additional Equipment:   Intra-op Plan:   Post-operative Plan:   Informed Consent: I have reviewed the patients History and Physical, chart, labs and discussed the procedure including the risks, benefits and alternatives for the proposed anesthesia with the patient or authorized representative who has indicated his/her understanding and acceptance.   Dental advisory given  Plan Discussed with: Anesthesiologist and Surgeon  Anesthesia Plan Comments:         Anesthesia Quick Evaluation

## 2012-04-29 NOTE — Progress Notes (Signed)
  Echocardiogram Echocardiogram Transesophageal has been performed.  Georgian Co 04/29/2012, 2:05 PM

## 2012-04-29 NOTE — Progress Notes (Signed)
Patient Name: [name]      SUBJECTIVE: tachypalps with ambulating  Past Medical History  Diagnosis Date  . Anxiety   . Atrial fibrillation   . GERD (gastroesophageal reflux disease)   . Complication of anesthesia     Nausea from versed  . PONV (postoperative nausea and vomiting)   . Family history of anesthesia complication     Sister and mother also had complications  . Anginal pain     On patient's right side, may be musculoskeletal  . Pneumonia     Viral pneumonia, unsure of time "long time ago"  . Heart murmur     Mitral valve prolapse, surgery in 2010  . CHF (congestive heart failure)     Prior to 2010 surgery  . Headache     From some medications  . Cancer     1998 Right breast cancer with lumpectomy and radiation therapy  . Arthritis     Some in neck and back, but "can't complain"  . SOB (shortness of breath) on exertion 04/28/2012    PHYSICAL EXAM Filed Vitals:   04/28/12 0935 04/28/12 1330 04/28/12 2024 04/29/12 0426  BP: 90/66 105/56 101/54 101/60  Pulse: 104 84 95 73  Temp:  97.6 F (36.4 C) 97.9 F (36.6 C) 98 F (36.7 C)  TempSrc:  Oral Oral Oral  Resp:  18 18 18   Height:      Weight:      SpO2:  98% 99% 94%    Well developed and nourished in no acute distress HENT normal Neck supple with JVP-flat Carotids brisk and full without bruits Clear Irregularly irregular rate and rhythm with rapid ventricular response, no murmurs or gallops Abd-soft with active BS without hepatomegaly No Clubbing cyanosis edema Skin-warm and dry A & Oriented  Grossly normal sensory and motor function  TELEMETRY: Reviewed telemetry pt in :    Intake/Output Summary (Last 24 hours) at 04/29/12 0825 Last data filed at 04/29/12 0800  Gross per 24 hour  Intake    480 ml  Output    300 ml  Net    180 ml    LABS: Basic Metabolic Panel:  Lab 04/28/12 1914 04/27/12 1055 04/27/12 0410  NA 133* -- 134*  K 3.6 -- 3.9  CL 100 -- 99  CO2 26 -- 24  GLUCOSE  95 -- 102*  BUN 16 -- 18  CREATININE 0.58 0.50 0.50  CALCIUM 8.8 -- 9.5  MG -- -- 2.1  PHOS -- -- --   Cardiac Enzymes:  Basename 04/27/12 2212 04/27/12 1649 04/27/12 1045  CKTOTAL -- -- --  CKMB -- -- --  CKMBINDEX -- -- --  TROPONINI <0.30 <0.30 <0.30   CBC:  Lab 04/29/12 0535 04/28/12 0525 04/27/12 1055 04/27/12 0410  WBC 4.8 5.3 6.1 6.2  NEUTROABS -- -- -- --  HGB 12.3 12.0 13.0 13.0  HCT 34.8* 35.2* 37.6 37.6  MCV 96.1 97.5 97.7 97.2  PLT 145* 159 175 166   PROTIME:  Basename 04/27/12 0410  LABPROT 13.1  INR 1.00   Liver Function Tests:  Basename 04/27/12 0410  AST 19  ALT 13  ALKPHOS 66  BILITOT 0.5  PROT 7.6  ALBUMIN 3.8     ASSESSMENT AND PLAN:  Patient Active Hospital Problem List: Chest pain (04/28/2012)   SOB (shortness of breath) on exertion (04/28/2012)     Atrial flutter  ECG reviewed all showed sinus>>  rec DCCV and coumadin prob with TEE given RVR  and symptoms Consider AAD if freq recurrences of afib   Will sign off call for questions  Signed, Sherryl Manges MD  04/29/2012

## 2012-04-29 NOTE — Anesthesia Postprocedure Evaluation (Signed)
  Anesthesia Post-op Note  Patient: [name]  Procedure(s) Performed: Procedure(s) (LRB) with comments: TRANSESOPHAGEAL ECHOCARDIOGRAM (TEE) (N/A) CARDIOVERSION (N/A)  Patient Location: case cancelled 2* blood clot noted on TEE

## 2012-04-29 NOTE — Progress Notes (Addendum)
ANTICOAGULATION CONSULT NOTE - follow up  Pharmacy Consult for Heparin Indication: atrial fibrillation/flutter  Allergies  Allergen Reactions  . Amoxicillin Nausea And Vomiting  . Amoxicillin-Pot Clavulanate Nausea And Vomiting  . Codeine Nausea And Vomiting  . Diuretic ([place] Grass-Hydran)     Pharmacy record  . Iohexol Hives     Code: HIVES, Desc: **11/22/08 **NO REACTION W/ OMNI 300// PT S/P 3 CT SCANS W/ IV CM AND NO HIVES/MMS**PT STATED HIVES 16 YRS AGO. POSSIBLE IV DYE W/ CT/XRAY. MEDICATED W/ 50MG  OF BENEDRYL PO PRIOR TO SCAN, Onset Date: [id number]   . Midazolam Hcl Nausea And Vomiting  . Quinolones Other (See Comments)    Unknown reaction  . Sulfa Antibiotics Other (See Comments)    Flu like symptoms 'serum sickness'  . Thioxanthenes Other (See Comments)    Flu like symptoms 'serum sickness'  . Valium Nausea And Vomiting  . Versed (Midazolam) Nausea And Vomiting   Patient Measurements: Height: 5\' 3"  (160 cm) Weight: 140 lb 10.5 oz (63.8 kg) IBW/kg (Calculated) : 52.4  Heparin Dosing Weight: 64 kg  Vital Signs: Temp: 98 F (36.7 C) (10/22 0426) Temp src: Oral (10/22 0426) BP: 101/60 mmHg (10/22 0426) Pulse Rate: 73  (10/22 0426)  Labs:  Basename 04/29/12 0535 04/28/12 1747 04/28/12 0525 04/27/12 2212 04/27/12 1649 04/27/12 1055 04/27/12 1045 04/27/12 0410  HGB 12.3 -- 12.0 -- -- -- -- --  HCT 34.8* -- 35.2* -- -- 37.6 -- --  PLT 145* -- 159 -- -- 175 -- --  APTT -- -- -- -- -- -- -- 30  LABPROT -- -- -- -- -- -- -- 13.1  INR -- -- -- -- -- -- -- 1.00  HEPARINUNFRC 0.45 0.69 -- -- -- -- -- --  CREATININE -- -- 0.58 -- -- 0.50 -- 0.50  CKTOTAL -- -- -- -- -- -- -- --  CKMB -- -- -- -- -- -- -- --  TROPONINI -- -- -- <0.30 <0.30 -- <0.30 --   Estimated Creatinine Clearance: 47.1 ml/min (by C-G formula based on Cr of 0.58).  Assessment:   78 year old woman admitted 04/27/12 with chest pain and shortness of breath.  Enzymes negative.  Shortness of  breath on exertion noted likely due to atrial flutter.  Cards rec DCCV with TEE and coumadin.   Coumadin not yet ordered.  Heparin level is 0.45 this am which is therapeutic on 850 units/hr.  She is without noted bleeding complications. CBC stable. PLTC low at 145 - was 166/175 on admit.  Goal of Therapy:  Heparin level 0.3-0.7 units/ml Monitor platelets by anticoagulation protocol: Yes   Plan:  1. continue heparin at 850 units/hr.  2. ? Order coumadin if desired? 3.  Daily heparin level and CBC while on heparin. Herby Abraham, Pharm.D. [phone] 04/29/2012 8:59 AM  Addendum: to start coumadin for afib.  Coumadin score = 2. Wt = 63. 8 kg.  INR goal 2-3.  Plan:  Coumadin 5 mg po x 1 dose Daily INR Coumadin book and video Herby Abraham, Pharm.D. [phone] 04/29/2012 9:45 AM

## 2012-04-29 NOTE — CV Procedure (Signed)
Procedure:  Transesophageal echocardiogram Indications:  Atrial flutter with RVR Complications:None IV Meds:  Fentanyl IV  Results:  Normal LV size and LVF Normal RV size and RVF.   Normal RA. Normal TV with moderate TR Normal PV with mild PR Trileaflet AV with trivial AI S/P MV repair with trivial MR Mildly dilated LA with no evidence of thrombus and trace spontaneous echo contrast The LA appendage is visualized.  There appears to be a cap overlying the mouth of the   appendage from prior over sewing of the appendage during MV repair and Maze   procedure.  There is a moderate sized thrombus in the appendage with evidence of  blood flow around it c/w with some blood flow from LA to LAA. Given results of thrombus in the appendage with concern for persistent flow between the LA and LAA the cardioversion was cancelled.  Assessment: 1.  Atrial fibrillation with RVR 2.  Probable LAA thrombus 3.  Systemic anticoaguation  Plan: 1.  D/C Heparin IV 2.  Xarelto 20mg  daily 3.  Cardizem CD 30mg  q6 hours and titrate for rate control 4.  Will need 4 weeks anticoagulation on Xarelto before DCCV

## 2012-04-29 NOTE — Preoperative (Signed)
Beta Blockers   Reason not to administer Beta Blockers:Not Applicable, pt off beta blocker per Dr. Mayford Knife 2* pt not tolerating well.

## 2012-04-29 NOTE — Transfer of Care (Signed)
Immediate Anesthesia Transfer of Care Note  Patient: [name]  Procedure(s) Performed: Procedure(s) (LRB) with comments: TRANSESOPHAGEAL ECHOCARDIOGRAM (TEE) (N/A) CARDIOVERSION (N/A)  Patient Location: case cancelled 2* blood clot noted on TEE

## 2012-04-29 NOTE — Progress Notes (Signed)
[facility] PROGRESS NOTE  [name]:[id number] DOB: 1925-09-06 DOA: 04/27/2012 PCP: No primary provider on file.  Assessment/Plan: 1. Chest Pain - Most likely musculoskeletal  - given cardiac history would favor tylenol for chest discomfort - CE x 3 negative - EKG with no St elevations or depressions with atrial flutter  2. SOB on exertion  - Likely related to atrial flutter  - Consulted [facility] 04/28/12.  3. H/o Atrial flutter  - Likely causing number 2, B blocker increased on admission.  - Patient taken for transesophageal echocardiogram and on review noticed that there was a moderate sized thrombus in th eappendage with evidence of blood flow arount it c/w some blood flow from LA to LAA. - Subsequently Cardiology recommended d/c'ing heparin gtt, starting xarelto 20 mg daily, cardizem cd 30 mg q 6 hours and titrate for control.   Code Status: Full Family Communication: No family at bedside Disposition Plan: Pending disposition from cardiologist.  Will see if patient will need further cardizem titration before discharging.   Consultants:  [facility]  Procedures:  None  Antibiotics:  None  Brief history: [name] is a pleasant 78 year old female  with a history of rheumatic heart disease, persistent atrial fibrillation, and severe mitral regurgitation status post recent mitral valve repair and cryo Cox-Maze by Dr. Tressie Stalker on December 14, 2008, who presents with symptomatic atypical atrial flutter.  Subsequently [facility] cardiology was called.     HPI/Subjective: Patient's main complaint is that she is hungry at this juncture.  Otherwise denies any chest discomfort.   Objective: Filed Vitals:   04/29/12 1445 04/29/12 1447 04/29/12 1500 04/29/12 1656  BP: 133/58 133/58 134/80 126/88  Pulse:   132   Temp:   97.8 F (36.6 C)   TempSrc:   Oral   Resp: 24 36 18   Height:      Weight:      SpO2: 100% 97% 96%     Intake/Output Summary  (Last 24 hours) at 04/29/12 1747 Last data filed at 04/29/12 1343  Gross per 24 hour  Intake    500 ml  Output      0 ml  Net    500 ml   Filed Weights   04/27/12 1010  Weight: 63.8 kg (140 lb 10.5 oz)    Exam:  General: Patient laying in bed smiling Cardiovascular: S1 and S2, no gallops  Respiratory: CTA BL , no wheezes  Abdomen: Soft, NT, ND Skin: warm and dry, no diaphoresis  Musculoskeletal: pain with palpation over right upper chest, no cyanosis  Psychiatric: mood and affect appropriate  Neurologic: answers questions appropriately, moves all extremities   Data Reviewed: Basic Metabolic Panel:  Lab 04/28/12 9562 04/27/12 1055 04/27/12 0410  NA 133* -- 134*  K 3.6 -- 3.9  CL 100 -- 99  CO2 26 -- 24  GLUCOSE 95 -- 102*  BUN 16 -- 18  CREATININE 0.58 0.50 0.50  CALCIUM 8.8 -- 9.5  MG -- -- 2.1  PHOS -- -- --   Liver Function Tests:  Lab 04/27/12 0410  AST 19  ALT 13  ALKPHOS 66  BILITOT 0.5  PROT 7.6  ALBUMIN 3.8   No results found for this basename: LIPASE:5,AMYLASE:5 in the last 168 hours No results found for this basename: AMMONIA:5 in the last 168 hours CBC:  Lab 04/29/12 0535 04/28/12 0525 04/27/12 1055 04/27/12 0410  WBC 4.8 5.3 6.1 6.2  NEUTROABS -- -- -- --  HGB 12.3  12.0 13.0 13.0  HCT 34.8* 35.2* 37.6 37.6  MCV 96.1 97.5 97.7 97.2  PLT 145* 159 175 166   Cardiac Enzymes:  Lab 04/27/12 2212 04/27/12 1649 04/27/12 1045  CKTOTAL -- -- --  CKMB -- -- --  CKMBINDEX -- -- --  TROPONINI <0.30 <0.30 <0.30   BNP (last 3 results) No results found for this basename: PROBNP:3 in the last 8760 hours CBG: No results found for this basename: GLUCAP:5 in the last 168 hours  No results found for this or any previous visit (from the past 240 hour(s)).   Studies: No results found.  Scheduled Meds:    . aspirin EC  81 mg Oral QHS  . atenolol  25 mg Oral Daily  . cholecalciferol  1,000 Units Oral Daily  . diltiazem  30 mg Oral Q6H  .  patient's guide to using coumadin book   Does not apply Once  . polyethylene glycol  17 g Oral BID  . rivaroxaban  15 mg Oral Q supper  . sodium chloride  3 mL Intravenous Q12H  . DISCONTD: polyethylene glycol  17 g Oral Daily  . DISCONTD: rivaroxaban  20 mg Oral Daily  . DISCONTD: warfarin  5 mg Oral ONCE-1800  . DISCONTD: warfarin   Does not apply Once  . DISCONTD: Warfarin - Pharmacist Dosing Inpatient   Does not apply q1800   Continuous Infusions:    . sodium chloride    . DISCONTD: heparin 850 Units/hr (04/28/12 1908)    Principal Problem:  *Chest pain Active Problems:  SOB (shortness of breath) on exertion  History of atrial flutter    Time spent: > 35 minutes.    Penny Pia  [facility] Pager [phone]. If 8PM-8AM, please contact night-coverage at [email or web address], password [facility] 04/29/2012, 5:47 PM  LOS: 2 days

## 2012-04-30 ENCOUNTER — Encounter (HOSPITAL_COMMUNITY): Payer: Self-pay | Admitting: Cardiology

## 2012-04-30 DIAGNOSIS — R079 Chest pain, unspecified: Secondary | ICD-10-CM | POA: Diagnosis not present

## 2012-04-30 DIAGNOSIS — R0602 Shortness of breath: Secondary | ICD-10-CM | POA: Diagnosis not present

## 2012-04-30 DIAGNOSIS — I4891 Unspecified atrial fibrillation: Secondary | ICD-10-CM | POA: Diagnosis not present

## 2012-04-30 DIAGNOSIS — I4892 Unspecified atrial flutter: Secondary | ICD-10-CM | POA: Diagnosis not present

## 2012-04-30 DIAGNOSIS — Z7901 Long term (current) use of anticoagulants: Secondary | ICD-10-CM | POA: Diagnosis not present

## 2012-04-30 DIAGNOSIS — K219 Gastro-esophageal reflux disease without esophagitis: Secondary | ICD-10-CM | POA: Diagnosis not present

## 2012-04-30 LAB — CBC
HCT: 34.2 % — ABNORMAL LOW (ref 36.0–46.0)
HCT: 35.4 % — ABNORMAL LOW (ref 36.0–46.0)
Hemoglobin: 11.8 g/dL — ABNORMAL LOW (ref 12.0–15.0)
Hemoglobin: 12.1 g/dL (ref 12.0–15.0)
MCH: 33.1 pg (ref 26.0–34.0)
MCH: 33.1 pg (ref 26.0–34.0)
MCHC: 34.5 g/dL (ref 30.0–36.0)
MCHC: 34.2 g/dL (ref 30.0–36.0)
MCV: 96.1 fL (ref 78.0–100.0)
MCV: 96.7 fL (ref 78.0–100.0)
Platelets: 135 10*3/uL — ABNORMAL LOW (ref 150–400)
Platelets: 137 10*3/uL — ABNORMAL LOW (ref 150–400)
RBC: 3.56 MIL/uL — ABNORMAL LOW (ref 3.87–5.11)
RBC: 3.66 MIL/uL — ABNORMAL LOW (ref 3.87–5.11)
RDW: 12.4 % (ref 11.5–15.5)
RDW: 12.5 % (ref 11.5–15.5)
WBC: 5.2 10*3/uL (ref 4.0–10.5)
WBC: 5.2 10*3/uL (ref 4.0–10.5)

## 2012-04-30 LAB — HEPARIN LEVEL (UNFRACTIONATED): Heparin Unfractionated: 1.79 IU/mL — ABNORMAL HIGH (ref 0.30–0.70)

## 2012-04-30 LAB — PROTIME-INR
INR: 1.25 (ref 0.00–1.49)
INR: 1.23 (ref 0.00–1.49)
Prothrombin Time: 15.5 seconds — ABNORMAL HIGH (ref 11.6–15.2)
Prothrombin Time: 15.3 seconds — ABNORMAL HIGH (ref 11.6–15.2)

## 2012-04-30 MED ORDER — ACETAMINOPHEN 325 MG PO TABS
650.0000 mg | ORAL_TABLET | Freq: Four times a day (QID) | ORAL | Status: DC | PRN
  Administered 2012-04-30: 650 mg via ORAL

## 2012-04-30 MED ORDER — DILTIAZEM HCL ER 60 MG PO CP12: 60.0000 mg | ORAL_CAPSULE | Freq: Two times a day (BID) | ORAL | Status: DC

## 2012-04-30 MED ORDER — ATENOLOL 50 MG PO TABS
50.0000 mg | ORAL_TABLET | Freq: Every day | ORAL | Status: DC
  Administered 2012-04-30 – 2012-05-01 (×2): 50 mg via ORAL
  Filled 2012-04-30 (×2): qty 1

## 2012-04-30 MED ORDER — DILTIAZEM HCL ER 90 MG PO CP12
90.0000 mg | ORAL_CAPSULE | Freq: Two times a day (BID) | ORAL | Status: DC
  Administered 2012-04-30 – 2012-05-01 (×3): 90 mg via ORAL
  Filled 2012-04-30 (×4): qty 1

## 2012-04-30 NOTE — Clinical Documentation Improvement (Signed)
GENERIC DOCUMENTATION CLARIFICATION QUERY  THIS DOCUMENT IS NOT A PERMANENT PART OF THE MEDICAL RECORD  TO RESPOND TO THE THIS QUERY, FOLLOW THE INSTRUCTIONS BELOW:  1. If needed, update documentation for the patient's encounter via the notes activity.  2. Access this query again and click edit on the In [employer].  3. After updating, or not, click F2 to complete all highlighted (required) fields concerning your review. Select "additional documentation in the medical record" OR "no additional documentation provided".  4. Click Sign note button.  5. The deficiency will fall out of your In Basket *Please let us know if you are not able to complete this workflow by phone or e-mail (listed below).  Please update your documentation within the medical record to reflect your response to this query.                                                                                        04/30/12   Dear Dr.Krishnan / Associates,  In a better effort to capture your patient's severity of illness, reflect appropriate length of stay and utilization of resources, a review of the patient medical record has revealed the following indicators.    Based on your clinical judgment, please clarify and document in a progress note and/or discharge summary the clinical condition associated with the following supporting information:  In responding to this query please exercise your independent judgment.  The fact that a query is asked, does not imply that any particular answer is desired or expected.   Possible Clinical Conditions?  _______Atrial Flutter   _______Other Condition  _______Cannot Clinically Determine    Risk Factors: Atrial flutter/bradycardia noted per 10/20 ED note. Chest Pain is listed first on active problem list of 10/21 with SOB on exertion likely related to A.flutter listed as #2 on active problem list.  Diagnostic: 10/22:  TEE  Treatment: 10/20:  Atenolol 25mg  po  qday; 10/22:  Cardizem 30mg  po q6hr; 10/22:  Coumadin 5mg  po x1; 10/22:  Xarelto 20mg  q supper; 10/23:  Cardizem SR 90mg  po q12hr; 10/23:  Atenolol 50mg  po qday.   You may use possible, probable, or suspect with inpatient documentation. possible, probable, suspected diagnoses MUST be documented at the time of discharge  Reviewed: No additional documentation provided.  Thank You,  Marciano Sequin,  Clinical Documentation Specialist:  Pager: [phone]  [facility]

## 2012-04-30 NOTE — Care Management Note (Unsigned)
    Page 1 of 1   04/30/2012     3:52:31 PM   CARE MANAGEMENT NOTE 04/30/2012  Patient:  [name], [name]   Account Number:  [id number]  Date Initiated:  04/30/2012  Documentation initiated by:  Milla Wahlberg  Subjective/Objective Assessment:   PT ADM WITH AFIB FLUTTER ON 04/27/12.  PTA, PT INDEPENDENT, LIVES ALONE.     Action/Plan:   WILL FOLLOW FOR HOME NEEDS AS PT PROGRESSES.  NO HOME NEEDS ANTICIPATED.   Anticipated DC Date:  04/30/2012   Anticipated DC Plan:  HOME/SELF CARE         Choice offered to / List presented to:             Status of service:  Completed, signed off Medicare Important Message given?   (If response is "NO", the following Medicare IM given date fields will be blank) Date Medicare IM given:   Date Additional Medicare IM given:    Discharge Disposition:  HOME/SELF CARE  Per UR Regulation:  Reviewed for med. necessity/level of care/duration of stay  If discussed at Long Length of Stay Meetings, dates discussed:    Comments:

## 2012-04-30 NOTE — Progress Notes (Signed)
[facility] PROGRESS NOTE  [name] ZOX:[id number] DOB: 02-29-1926 DOA: 04/27/2012 PCP: No primary provider on file.  Assessment/Plan: 1. Chest Pain - Resolved. Most likely musculoskeletal  - given cardiac history would favor tylenol for chest discomfort  - CE x 3 negative  - EKG with no St elevations or depressions with atrial flutter  2. SOB on exertion  - Much improved. Likely related to atrial flutter  - appreciate  [facility] 04/28/12.  3. H/o Atrial flutter  - Likely causing number 2, B blocker increased on admission.  - Patient taken for transesophageal echocardiogram and on review noticed that there was a moderate sized thrombus in th eappendage with evidence of blood flow arount it c/w some blood flow from LA to LAA.  - Subsequently Cardiology recommended d/c'ing heparin gtt, starting xarelto 20 mg daily, cardizem cd 30 mg q 6 hours and titrate for control. - rate still not controlled and pt experiencing n/v with meds. Per Dr. Katrinka Blazing will adjust dose meds and monitor. Not home today  Code Status: full Family Communication: son at bedside Disposition Plan: likely home tomorrow if ok with cards   Consultants:  cardiology  Procedures:  none  Antibiotics:  none  HPI/Subjective: Sitting up in chair visiting with family. Smiling NAD Denies discomfort  Objective: Filed Vitals:   04/29/12 1500 04/29/12 1656 04/29/12 2117 04/30/12 0533  BP: 134/80 126/88 105/69 96/63  Pulse: 132  74 75  Temp: 97.8 F (36.6 C)  98.4 F (36.9 C) 98.1 F (36.7 C)  TempSrc: Oral  Oral Oral  Resp: 18  18 18   Height:      Weight:      SpO2: 96%  98% 97%    Intake/Output Summary (Last 24 hours) at 04/30/12 1142 Last data filed at 04/30/12 0700  Gross per 24 hour  Intake    740 ml  Output      0 ml  Net    740 ml   Filed Weights   04/27/12 1010  Weight: 63.8 kg (140 lb 10.5 oz)    Exam:   General:  Alert oriented x3  Cardiovascular: irregularly  irregular, No MGR trace LEE PPP  Respiratory: normal effort BSCTAB No wheeze, rhonchi  Abdomen: soft +BS nontender to palp  Data Reviewed: Basic Metabolic Panel:  Lab 04/28/12 8119 04/27/12 1055 04/27/12 0410  NA 133* -- 134*  K 3.6 -- 3.9  CL 100 -- 99  CO2 26 -- 24  GLUCOSE 95 -- 102*  BUN 16 -- 18  CREATININE 0.58 0.50 0.50  CALCIUM 8.8 -- 9.5  MG -- -- 2.1  PHOS -- -- --   Liver Function Tests:  Lab 04/27/12 0410  AST 19  ALT 13  ALKPHOS 66  BILITOT 0.5  PROT 7.6  ALBUMIN 3.8   No results found for this basename: LIPASE:5,AMYLASE:5 in the last 168 hours No results found for this basename: AMMONIA:5 in the last 168 hours CBC:  Lab 04/30/12 0720 04/30/12 0555 04/29/12 0535 04/28/12 0525 04/27/12 1055  WBC 5.2 5.2 4.8 5.3 6.1  NEUTROABS -- -- -- -- --  HGB 12.1 11.8* 12.3 12.0 13.0  HCT 35.4* 34.2* 34.8* 35.2* 37.6  MCV 96.7 96.1 96.1 97.5 97.7  PLT 137* 135* 145* 159 175   Cardiac Enzymes:  Lab 04/27/12 2212 04/27/12 1649 04/27/12 1045  CKTOTAL -- -- --  CKMB -- -- --  CKMBINDEX -- -- --  TROPONINI <0.30 <0.30 <0.30   BNP (last 3 results)  No results found for this basename: PROBNP:3 in the last 8760 hours CBG: No results found for this basename: GLUCAP:5 in the last 168 hours  No results found for this or any previous visit (from the past 240 hour(s)).   Studies: No results found.  Scheduled Meds:   . aspirin EC  81 mg Oral QHS  . atenolol  50 mg Oral Daily  . diltiazem  90 mg Oral Q12H  . polyethylene glycol  17 g Oral BID  . rivaroxaban  15 mg Oral Q supper  . sodium chloride  3 mL Intravenous Q12H  . DISCONTD: atenolol  25 mg Oral Daily  . DISCONTD: cholecalciferol  1,000 Units Oral Daily  . DISCONTD: diltiazem  60 mg Oral Q12H  . DISCONTD: diltiazem  30 mg Oral Q6H  . DISCONTD: patient's guide to using coumadin book   Does not apply Once  . DISCONTD: rivaroxaban  20 mg Oral Daily  . DISCONTD: warfarin  5 mg Oral ONCE-1800  . DISCONTD:  warfarin   Does not apply Once  . DISCONTD: Warfarin - Pharmacist Dosing Inpatient   Does not apply q1800   Continuous Infusions:   . sodium chloride    . DISCONTD: heparin 850 Units/hr (04/28/12 1908)    Principal Problem:  *Chest pain Active Problems:  SOB (shortness of breath) on exertion  History of atrial flutter    Time spent: 30 minutes    [facility] M NP [facility]  If 8PM-8AM, please contact night-coverage at [email or web address], password [facility] 04/30/2012, 11:42 AM  LOS: 3 days

## 2012-04-30 NOTE — Progress Notes (Signed)
Still without good rate control. Plan to increase oral calcium blocker and beta blocker. Will do OP cardioversion after anticoagulation x 4 weeks.

## 2012-04-30 NOTE — Progress Notes (Signed)
Patient seen, examined and discussed with my nurse practitioner. Agree with above. Chest pain is atypical for cardiac and is musculoskeletal. She is feeling better. In addition her shortness of breath is related to her atrial flutter. Still having episodes of tachycardia so appreciate cardiology recommendations. Will monitor for another 24 hours and see if she is in better condition and hopefully can be discharged home tomorrow. Have started on Xarelto.

## 2012-05-01 DIAGNOSIS — Z5181 Encounter for therapeutic drug level monitoring: Secondary | ICD-10-CM

## 2012-05-01 DIAGNOSIS — I4891 Unspecified atrial fibrillation: Secondary | ICD-10-CM | POA: Diagnosis not present

## 2012-05-01 DIAGNOSIS — I4892 Unspecified atrial flutter: Secondary | ICD-10-CM | POA: Diagnosis not present

## 2012-05-01 DIAGNOSIS — Z7901 Long term (current) use of anticoagulants: Secondary | ICD-10-CM | POA: Diagnosis not present

## 2012-05-01 DIAGNOSIS — R0602 Shortness of breath: Secondary | ICD-10-CM | POA: Diagnosis not present

## 2012-05-01 DIAGNOSIS — K219 Gastro-esophageal reflux disease without esophagitis: Secondary | ICD-10-CM | POA: Diagnosis not present

## 2012-05-01 MED ORDER — POLYETHYLENE GLYCOL 3350 17 G PO PACK: 17.0000 g | PACK | Freq: Two times a day (BID) | ORAL | Status: DC

## 2012-05-01 MED ORDER — DILTIAZEM HCL ER 90 MG PO CP12: 90.0000 mg | ORAL_CAPSULE | Freq: Two times a day (BID) | ORAL | Status: DC

## 2012-05-01 MED ORDER — ATENOLOL 50 MG PO TABS: 50.0000 mg | ORAL_TABLET | Freq: Every day | ORAL | Status: DC

## 2012-05-01 MED ORDER — RIVAROXABAN 15 MG PO TABS: 15.0000 mg | ORAL_TABLET | Freq: Every day | ORAL | Status: DC

## 2012-05-01 NOTE — Progress Notes (Signed)
Patient Name: [name] Date of Encounter: 05/01/2012    SUBJECTIVE: Atrial fib now has rate control. We walked in the hall and she is asymptomatic.  TELEMETRY:  VR is running in the 60-80 bpm range Filed Vitals:   04/30/12 0533 04/30/12 1328 04/30/12 2016 05/01/12 0536  BP: 96/63 90/71 103/73 93/49  Pulse: 75 76 70 77  Temp: 98.1 F (36.7 C) 97.8 F (36.6 C) 97.6 F (36.4 C) 97.5 F (36.4 C)  TempSrc: Oral Oral Oral Oral  Resp: 18 18 18 18   Height:      Weight:      SpO2: 97% 99% 100% 99%    Intake/Output Summary (Last 24 hours) at 05/01/12 0936 Last data filed at 04/30/12 2018  Gross per 24 hour  Intake    960 ml  Output      0 ml  Net    960 ml    LABS: Basic Metabolic Panel: No results found for this basename: NA:2,K:2,CL:2,CO2:2,GLUCOSE:2,BUN:2,CREATININE:2,CALCIUM:2,MG:2,PHOS:2 in the last 72 hours CBC:  Basename 04/30/12 0720 04/30/12 0555  WBC 5.2 5.2  NEUTROABS -- --  HGB 12.1 11.8*  HCT 35.4* 34.2*  MCV 96.7 96.1  PLT 137* 135*    Radiology/Studies:  NAD  Physical Exam: Blood pressure 93/49, pulse 77, temperature 97.5 F (36.4 C), temperature source Oral, resp. rate 18, height 5\' 3"  (1.6 m), weight 63.8 kg (140 lb 10.5 oz), SpO2 99.00%. Weight change:    Irregular rhythm with controlled rate.  ASSESSMENT:  1. Atrial fibrillation with controlled rate.  2. Left atrial thrombus  3. Anticoagulation therapy  Plan:  1. F/u Dr. Katrinka Blazing in 2-3 weeks to assess A fib and arrange OP cardioversion  Signed, Lesleigh Noe 05/01/2012, 9:36 AM

## 2012-05-01 NOTE — Discharge Summary (Signed)
Physician Discharge Summary  [name] ZOX:[id number] DOB: Dec 01, 1925 DOA: 04/27/2012  PCP: No primary provider on file.  Admit date: 04/27/2012 Discharge date: 05/01/2012  Time spent: 40 minutes  Recommendations for Outpatient Follow-up:  Will discharge to home. Will follow up with Dr. Katrinka Blazing 05/16/12 Discharge Diagnoses:  Principal Problem:  *Chest pain Active Problems:  SOB (shortness of breath) on exertion  History of atrial flutter  Anticoagulation goal of INR 2 to 3   Discharge Condition: Medically stable and ready for discharge to home  Diet recommendation: heart healthy  Filed Weights   04/27/12 1010  Weight: 63.8 kg (140 lb 10.5 oz)    History of present illness:  [name] is a very pleasant 78 year old female with a history of rheumatic heart disease, persistent atrial fibrillation, and severe mitral regurgitation status post recent mitral valve repair and cryo Cox-Maze by Dr. Tressie Stalker on December 14, 2008, who presented to ED with symptomatic atypical atrial flutter. Reported that for the previous 2 days she had been having SOB with activity. Had also felt palpitations. She reported that she had been working out more vigorously at [employer] doing water sports and developed subsequently right sided chest discomfort. Given her chest discomfort and 2 days of SOB with activity as well as palpitations patient decided to come to the ED for further evaluation. In ED patient presented with HR of 130's reportedly and she was bolused with IV cardizem 10 mg. Rate subsequently came down to 100's and we were called for further evaluation and recommendations. Chest pain per ER physician was reproducible on palpation.   Hospital Course:  Chest Pain: admitted to tele. CE neg. EKG with no elevation or depressions with a flutter. Given cardiac history would favor tylenol for chest discomfort . Seen by cardiology and TEE on 04/29/12 there was a moderate sized thrombus in th eappendage with  evidence of blood flow arount it c/w some blood flow from LA to LAA.  Subsequently Cardiology recommended d/c'ing heparin gtt, starting xarelto 20 mg daily, cardizem . Pt will be discharged on same. Will follow up with Dr. Katrinka Blazing on 05/16/12. At time of discharge rate controlled    2. SOB on exertion  - Resolved.  Likely related to atrial flutter . On day of discharge pt ambulated in hall with Dr. Katrinka Blazing and was asymptomic. appreciate [facility]   3. H/o Atrial flutter  - Likely causing number 2, B blocker increased on admission.  - Patient taken for transesophageal echocardiogram and on review noticed that there was a moderate sized thrombus in th eappendage with evidence of blood flow arount it c/w some blood flow from LA to LAA.  - Subsequently Cardiology recommended d/c'ing heparin gtt, starting xarelto 20 mg daily, cardizem.  - Rate controlled on discharge     Procedures:   TEE  Consultations:  cardiology  Discharge Exam: Filed Vitals:   04/30/12 0533 04/30/12 1328 04/30/12 2016 05/01/12 0536  BP: 96/63 90/71 103/73 93/49  Pulse: 75 76 70 77  Temp: 98.1 F (36.7 C) 97.8 F (36.6 C) 97.6 F (36.4 C) 97.5 F (36.4 C)  TempSrc: Oral Oral Oral Oral  Resp: 18 18 18 18   Height:      Weight:      SpO2: 97% 99% 100% 99%    General: awake alert up in chair smiling Cardiovascular: irregular, No MGR trace LEE PPP Respiratory: normal effort BSCTAB No wheeze/rhonchi  Discharge Instructions  Discharge Orders    Future Appointments: Provider:  Department: Dept Phone: Center:   01/30/2013 10:00 AM [facility] 1 [facility] [phone] [facility]     Future Orders Please Complete By Expires   Diet - low sodium heart healthy      Increase activity slowly      Call MD for:  difficulty breathing, headache or visual disturbances      Call MD for:  persistant dizziness or light-headedness          Medication List     As of 05/01/2012 10:35 AM    TAKE these  medications         aspirin EC 81 MG tablet   Take 81 mg by mouth at bedtime.      atenolol 50 MG tablet   Commonly known as: TENORMIN   Take 1 tablet (50 mg total) by mouth daily.      cholecalciferol 1000 UNITS tablet   Commonly known as: VITAMIN D   Take 1,000 Units by mouth daily.      co-enzyme Q-10 50 MG capsule   Take 50 mg by mouth daily.      diltiazem 90 MG 12 hr capsule   Commonly known as: CARDIZEM SR   Take 1 capsule (90 mg total) by mouth every 12 (twelve) hours.      famotidine 20 MG tablet   Commonly known as: PEPCID   Take 20 mg by mouth at bedtime as needed. Acid reflux      fish oil-omega-3 fatty acids 1000 MG capsule   Take 1 g by mouth daily.      FLAX SEEDS PO   Take 1 tablet by mouth daily.      LORazepam 0.5 MG tablet   Commonly known as: ATIVAN   Take 0.5 mg by mouth at bedtime as needed. For sleep      neomycin-polymyxin-hydrocortisone otic solution   Commonly known as: CORTISPORIN   Place 4-5 drops into the right ear 2 (two) times daily. For 5 days      OVER THE COUNTER MEDICATION   Take 1 each by mouth daily. Hemp heart seeds      OVER THE COUNTER MEDICATION   Take 1 each by mouth daily. Greens plus supplement      polyethylene glycol packet   Commonly known as: MIRALAX / GLYCOLAX   Take 17 g by mouth 2 (two) times daily.      polyvinyl alcohol 1.4 % ophthalmic solution   Commonly known as: LIQUIFILM TEARS   Place 1 drop into both eyes as needed. For dry eyes      Rivaroxaban 15 MG Tabs tablet   Commonly known as: XARELTO   Take 1 tablet (15 mg total) by mouth daily with supper.      sodium chloride 0.65 % nasal spray   Commonly known as: OCEAN   Place 1 spray into the nose as needed. Dry nose      vitamin C 500 MG tablet   Commonly known as: ASCORBIC ACID   Take 500 mg by mouth daily.           Follow-up Information    Follow up with Lesleigh Noe, MD. On 05/16/2012. (10:15 AM)    Contact information:   [address] [place] [address] [phone]           The results of significant diagnostics from this hospitalization (including imaging, microbiology, ancillary and laboratory) are listed below for reference.    Significant  Diagnostic Studies: Dg Chest Portable 1 View  04/27/2012  *RADIOLOGY REPORT*  Clinical Data: Atrial fibrillation.  PORTABLE CHEST - 1 VIEW  Comparison: 09/24/2011  Findings: Prominent soft tissue in the right cardiophrenic angle as compared to previous CT chest from 01/31/2012 and represents segmental elevation of left hemidiaphragm.  Mild cardiac enlargement with normal pulmonary vascularity.  Cardiac valve prosthesis.  Calcification and torsion of the aorta.  No focal airspace consolidation in the lungs.  No blunting of costophrenic angles.  No pneumothorax.  Mediastinal contours appear intact.  No significant change since previous study.  IMPRESSION: No evidence of active pulmonary disease.   Original Report Authenticated By: Marlon Pel, M.D.     Microbiology: No results found for this or any previous visit (from the past 240 hour(s)).   Labs: Basic Metabolic Panel:  Lab 04/28/12 1610 04/27/12 1055 04/27/12 0410  NA 133* -- 134*  K 3.6 -- 3.9  CL 100 -- 99  CO2 26 -- 24  GLUCOSE 95 -- 102*  BUN 16 -- 18  CREATININE 0.58 0.50 0.50  CALCIUM 8.8 -- 9.5  MG -- -- 2.1  PHOS -- -- --   Liver Function Tests:  Lab 04/27/12 0410  AST 19  ALT 13  ALKPHOS 66  BILITOT 0.5  PROT 7.6  ALBUMIN 3.8   No results found for this basename: LIPASE:5,AMYLASE:5 in the last 168 hours No results found for this basename: AMMONIA:5 in the last 168 hours CBC:  Lab 04/30/12 0720 04/30/12 0555 04/29/12 0535 04/28/12 0525 04/27/12 1055  WBC 5.2 5.2 4.8 5.3 6.1  NEUTROABS -- -- -- -- --  HGB 12.1 11.8* 12.3 12.0 13.0  HCT 35.4* 34.2* 34.8* 35.2* 37.6  MCV 96.7 96.1 96.1 97.5 97.7  PLT 137* 135* 145* 159 175   Cardiac Enzymes:  Lab 04/27/12  2212 04/27/12 1649 04/27/12 1045  CKTOTAL -- -- --  CKMB -- -- --  CKMBINDEX -- -- --  TROPONINI <0.30 <0.30 <0.30   BNP: BNP (last 3 results) No results found for this basename: PROBNP:3 in the last 8760 hours CBG: No results found for this basename: GLUCAP:5 in the last 168 hours     Signed:  Gwenyth Bender NP [facility] 05/01/2012, 10:35 AM

## 2012-05-01 NOTE — Discharge Summary (Signed)
Pt seen, examined & discussed with my NP.  Agree with above.  Doing much better in regards to rate control.  Ok by cards for discharge,

## 2012-05-13 DIAGNOSIS — N3941 Urge incontinence: Secondary | ICD-10-CM | POA: Diagnosis not present

## 2012-05-16 DIAGNOSIS — Z954 Presence of other heart-valve replacement: Secondary | ICD-10-CM | POA: Diagnosis not present

## 2012-05-16 DIAGNOSIS — I4891 Unspecified atrial fibrillation: Secondary | ICD-10-CM | POA: Diagnosis not present

## 2012-05-16 DIAGNOSIS — I5033 Acute on chronic diastolic (congestive) heart failure: Secondary | ICD-10-CM | POA: Diagnosis not present

## 2012-05-16 DIAGNOSIS — I10 Essential (primary) hypertension: Secondary | ICD-10-CM | POA: Diagnosis not present

## 2012-05-16 DIAGNOSIS — Z7901 Long term (current) use of anticoagulants: Secondary | ICD-10-CM | POA: Diagnosis not present

## 2012-05-22 DIAGNOSIS — I4891 Unspecified atrial fibrillation: Secondary | ICD-10-CM | POA: Diagnosis not present

## 2012-05-23 ENCOUNTER — Other Ambulatory Visit: Payer: Self-pay | Admitting: Interventional Cardiology

## 2012-06-02 DIAGNOSIS — H40009 Preglaucoma, unspecified, unspecified eye: Secondary | ICD-10-CM | POA: Diagnosis not present

## 2012-06-03 DIAGNOSIS — N3941 Urge incontinence: Secondary | ICD-10-CM | POA: Diagnosis not present

## 2012-06-11 DIAGNOSIS — I509 Heart failure, unspecified: Secondary | ICD-10-CM | POA: Diagnosis not present

## 2012-06-11 DIAGNOSIS — I4891 Unspecified atrial fibrillation: Secondary | ICD-10-CM | POA: Diagnosis not present

## 2012-06-11 DIAGNOSIS — I059 Rheumatic mitral valve disease, unspecified: Secondary | ICD-10-CM | POA: Diagnosis not present

## 2012-06-12 ENCOUNTER — Other Ambulatory Visit: Payer: Self-pay | Admitting: Cardiology

## 2012-06-12 NOTE — H&P (Signed)
Hs/pre-cardioversion.       HPI:  General:  [name] is followed by Dr Katrinka Blazing and was hospitalized with atrial for ablation/flutter. Dr Katrinka Blazing wanted to perform cardioversion but TEE demonstrated a large thrombus April 25, 2012. She was placed on Xarelto and awaited appropriate anticoagulation of at least 4 weeks with plans to proceed with electrical cardioversion. She previously had cardioversion in 2010, and MAZE procedure the same year..  Patient denies chest pain, syncope, nor PND. She does have palpitations with the Afib.and has dizziness and fatigue since out of rhythm. Her symptoms have not worsened but she does not feel well in the Atrial fibrillation. She does have nausea at times, she stopped her vitamins and this helped but it has not completely resolved. She has SOB with exertion such as sweeping leaves off porch.       ROS:  as noted in HPI, no black or bloody BMs, no diarrhea, no vomiting + nausea, no neurological changes, no fever, + chronically cold, no wheezing nor productive cough.       Medical History: mitral valve prolapse with severe mitral regurgitation by echo 10/2008. LVEF > 65%, s/p MV repair 12/2008 and MAZE procedure - Dr. Cornelius Moras, Dr. Katrinka Blazing, History of breast cancer - 1998 - surgery only, Gastroesophageal reflux disease, Lumbar disc disease - occasional pain, Stress incontinence, atrial fibrillation (onset 2009), s/p elective cardioversion 09/2008, MAZE procedure 6/10 - NSR - Dr. Katrinka Blazing, fall with fractured wrist and frontal hematoma ([place], 2008), Hematoma post catheter ablation, osteopenia - alendronate 2003-2007, last BMD T score -1.6, FRAX 1.4%, 8.8%, recurrent UTIs, Segmental colitis, Possible renal cell cancer treated with ablation, Hypercholesterolemia, colon polyp (2008 - repeat 2011 negative) - Dr. Lesleigh Noe, ophth - Dr. Nile Riggs, Dr. Blima Ledger, neuropathy - Dr. Genene Churn, laser treatment, hemorrhoids - Dr. Kinnie Scales, incidental lung nodule on CT 3/13 - repeat  7/13 unchanged and c/w scarrning - consider repeat in 7/14.        Surgical History: oral surgery , arthroscopic knee surgery , breast lump removed , hysterectomy abdominal - supracervical 1968, appendectomy , C section x3 , MV repair with annuloplasty; Maze procedure 12/14/2008, colonoscopy-adenomatous polyps 09/17/06, colonoscopy-no polyps 07/06/94.        Hospitalization/Major Diagnostic Procedure: not in the past year .        Family History: Father: deceased 108 yrs MI Mother: deceased 17 yrs CHF- colon polyps Sister 1: deceased 80 yrs brain tumor  negative for colon cancer or liver disease.       Social History:  General: History of smoking cigarettes: Never smoked. no Smoking. Alcohol: yes, occasionally. no Recreational drug use. Exercise: regularly. Marital Status: widowed. Children: Boys, 3.  Has 3 boys, 2 in town.       Medications: Acetaminophen 500 MG Tablet 1 tablet as needed, Probiotic Capsule 1 capsule Once a day, Omega 3 1000 MG Capsule 1 capsule with a meal STOPPED, Diltiazem HCl CR 90 MG Capsule Extended Release 12 Hour 1 capsule Twice a day, Xarelto 20 mg . tablet 1 tablet once a day, Lorazepam 0.5 MG Tablet 1 tablet Once a day, Atenolol 50 MG Tablet 1 tablet Once a day, OTC papaya as directed prn, Medication List reviewed and reconciled with the patient       Allergies: Sulfa drugs (for allergy), Codeine (for allergy), Valium, Versed: vomiting, Sotolol: nausea, Amiodarone HCl: nausea, Lisinopril: cough, Aspirin 325 mg: wired ---felt her hr was out of rhythm, cipro: flu like symptoms, vancoymycin: vomiting, amoxillin : vomiting.  Objective:     Vitals: Wt 141.0, Wt change -3.2 lb, Ht 63.25, BMI 24.78, Pulse sitting 76, BP sitting 100/72.       Examination:  Cardiology Exam:  GENERAL APPEARANCE: pleasant, NAD, comfortable, elderly, thin, female. HEENT: normal. CAROTID UPSTROKE: no bruit, upstrokes intact. JVD: flat. HEART: regular rate and rhythm, normal S1S2, no  rub, no gallop, or click (EKG confirmed atrial flutter). HEART MURMUR: none. LUNGS: clear to auscultation, no wheezing/rhonchi/rales. ABDOMEN: soft, non-tender, + bowel sounds. EXTREMITIES: no leg edema. PERIPHERAL PULSES: 2+, bilateral. NEUROLOGIC: grossly intact, cranial nerves intact, gait WNL. MOOD: normal.        Assessment:     Assessment:  1. Atrial fibrillation - 427.31 (Primary)  2. Mitral valve disorders - 424.0  3. Congestive heart failure, unspecified - 428.0    Plan:     1. Atrial fibrillation Continue Diltiazem HCl CR Capsule Extended Release 12 Hour, 90 MG, 1 capsule, Orally, Twice a day ; Continue Xarelto 20 mg tablet, ., 1 tablet, orally, once a day ; Continue Atenolol Tablet, 50 MG, 1 tablet, Orally, Once a day .  LAB: CBC with Diff    WBC 9.4 4.0-11.0 - K/ul    RBC 4.04 4.20-5.40 - M/uL L   HGB 13.8 12.0-16.0 - g/dL    HCT 91.4 78.2-95.6 - %    MCH 34.1 27.0-33.0 - pg H   MPV 8.4 7.5-10.7 - fL    MCV 99.2 81.0-99.0 - fL H   MCHC 34.4 32.0-36.0 - g/dL    RDW 21.3 08.6-57.8 - %    NRBC# 0.01 -    PLT 191 150-400 - K/uL    NEUT % 67.9 43.3-71.9 - %    NRBC% 0.10 - %    LYMPH% 24.8 16.8-43.5 - %    MONO % 6.3 4.6-12.4 - %    EOS % 0.6 0.0-7.8 - %    BASO % 0.4 0.0-1.0 - %    NEUT # 6.4 1.9-7.2 - K/uL    LYMPH# 2.30 1.10-2.70 - K/uL    MONO # 0.6 0.3-0.8 - K/uL    EOS # 0.1 0.0-0.6 - K/uL    BASO # 0.0 0.0-0.1 - K/uL     Shawanda Sievert A 06/11/2012 09:01:17 PM > ok for cardioversion    LAB: Comp Metabolic Panel    GLUCOSE 98 70-99 - mg/dL    BUN 24 4-69 - mg/dL    CREATININE 6.29 5.28-4.13 - mg/dl    eGFR (NON-AFRICAN AMERICAN) 82 >60 - calc    eGFR (AFRICAN AMERICAN) 100 >60 - calc    SODIUM 133 136-145 - mmol/L L   POTASSIUM 4.8 3.5-5.5 - mmol/L    CHLORIDE 98 98-107 - mmol/L    C02 29 22-32 - mmol/L    ANION GAP 11.2 6.0-20.0 - mmol/L    CALCIUM 9.6 8.6-10.3 - mg/dL    T PROTEIN 7.7 2.4-4.0 - g/dL    ALBUMIN 4.4 1.0-2.7 - g/dL    T.BILI 0.6  0.3-1.0 - mg/dL    ALP 59 25-366 - U/L    AST 16 0-39 - U/L    ALT 15 0-52 - U/L     Fleming Prill A 06/11/2012 09:02:00 PM > ok for cardioversion, pt is not on diuretic    LAB: PT and PTT ([id number])    aPTT 28 24-33 - SEC    INR 1.1 0.8-1.2 -    Prothrombin Time 11.4 9.1-12.0 - SEC     Portia Wisdom A 06/12/2012 08:20:42 AM >  ok on Xarelto for cardioversion    Diagnostic Imaging:EKG Atrial flutter rate controlled, Harward,Amy 06/11/2012 11:09:33 AM > Tannen Vandezande A 06/11/2012 11:17:55 AM > pre cardioversion  Reviewed Cardioversion procedure including potential risk including skin irritation, strokes, arrthymias, and reaction to sedatives,.        Procedures:  Venipuncture:  Venipuncture: Kyle,Rebecca 06/11/2012 12:28:52 PM > , performed in right arm.        Immunizations:        Labs:        Procedure Codes: 21308 EKG I AND R, 85025 ECL CBC PLATELET DIFF, 80053 ECL COMP METABOLIC PANEL, 36415 BLOOD COLLECTION ROUTINE VENIPUNCTURE       Preventive:         Follow Up: HS pending cardioversion (Reason: Atrial flutter)

## 2012-06-19 ENCOUNTER — Ambulatory Visit (HOSPITAL_COMMUNITY): Payer: Medicare Other | Admitting: *Deleted

## 2012-06-19 ENCOUNTER — Encounter (HOSPITAL_COMMUNITY): Payer: Self-pay | Admitting: *Deleted

## 2012-06-19 ENCOUNTER — Ambulatory Visit (HOSPITAL_COMMUNITY)
Admission: RE | Admit: 2012-06-19 | Discharge: 2012-06-19 | Disposition: A | Discharge status: Home / Self Care | Payer: Medicare Other | Source: Ambulatory Visit | Attending: Interventional Cardiology | Admitting: Interventional Cardiology

## 2012-06-19 ENCOUNTER — Encounter (HOSPITAL_COMMUNITY)
Admission: RE | Disposition: A | Discharge status: Home / Self Care | Payer: Self-pay | Source: Ambulatory Visit | Attending: Interventional Cardiology

## 2012-06-19 DIAGNOSIS — I4891 Unspecified atrial fibrillation: Secondary | ICD-10-CM | POA: Diagnosis not present

## 2012-06-19 DIAGNOSIS — I4819 Other persistent atrial fibrillation: Secondary | ICD-10-CM | POA: Diagnosis present

## 2012-06-19 HISTORY — PX: CARDIOVERSION: SHX1299

## 2012-06-19 SURGERY — CARDIOVERSION
Anesthesia: Monitor Anesthesia Care | Wound class: Clean

## 2012-06-19 MED ORDER — PROPOFOL 10 MG/ML IV BOLUS
INTRAVENOUS | Status: DC | PRN
  Administered 2012-06-19: 50 mg via INTRAVENOUS

## 2012-06-19 MED ORDER — LIDOCAINE HCL (CARDIAC) 20 MG/ML IV SOLN
INTRAVENOUS | Status: DC | PRN
  Administered 2012-06-19: 60 mg via INTRAVENOUS

## 2012-06-19 NOTE — H&P (Signed)
Please see the attached preprocedure workup including the history and physical. There is been no change in her status since this information was generated and office on 06/12/2012. The procedure and its risks were discussed with the patient including the possibility of anesthesia complications, musculoskeletal injury, skin burn, stroke, and death. She excepts these procedures and is willing to proceed

## 2012-06-19 NOTE — CV Procedure (Signed)
Electrical Cardioversion Procedure Note SHEMEKA WARDLE [id number] Sep 20, 1925  Procedure: Electrical Cardioversion Indications:  Atrial Fibrillation  Time Out: Verified patient identification, verified procedure,medications/allergies/relevent history reviewed, required imaging and test results available.  Performed  Procedure Details  The patient was NPO after midnight. Anesthesia was administered at the beside  by Dr.Hodierne with 50 mg of propofol.  Cardioversion was done with synchronized biphasic defibrillation with AP pads with 200watts.  The patient converted to normal sinus rhythm. The patient tolerated the procedure well   IMPRESSION:  Successful cardioversion of atrial fibrillation    Veatrice Kells W 06/19/2012, 3:01 PM

## 2012-06-19 NOTE — Transfer of Care (Signed)
Immediate Anesthesia Transfer of Care Note  Patient: [name]  Procedure(s) Performed: Procedure(s) (LRB) with comments: CARDIOVERSION (N/A) - h/p from 11/8 in file drawer/dl  Patient Location: PACU  Anesthesia Type:MAC  Level of Consciousness: awake, alert  and oriented  Airway & Oxygen Therapy: Patient Spontanous Breathing and Patient connected to nasal cannula oxygen  Post-op Assessment: Report given to PACU RN and Post -op Vital signs reviewed and stable  Post vital signs: Reviewed and stable  Complications: No apparent anesthesia complications

## 2012-06-19 NOTE — Anesthesia Preprocedure Evaluation (Signed)
Anesthesia Evaluation  Patient identified by MRN, date of birth, ID band Patient awake    Reviewed: Allergy & Precautions, H&P , NPO status , Patient's Chart, lab work & pertinent test results, reviewed documented beta blocker date and time   History of Anesthesia Complications (+) PONV  Airway Mallampati: II TM Distance: >3 FB Neck ROM: Full    Dental  (+) Teeth Intact   Pulmonary shortness of breath,          Cardiovascular + angina +CHF + dysrhythmias Atrial Fibrillation + Valvular Problems/Murmurs  MVR 12/2008   Neuro/Psych    GI/Hepatic GERD-  ,  Endo/Other    Renal/GU      Musculoskeletal   Abdominal   Peds  Hematology   Anesthesia Other Findings   Reproductive/Obstetrics                           Anesthesia Physical Anesthesia Plan  ASA: III  Anesthesia Plan: MAC   Post-op Pain Management:    Induction: Intravenous  Airway Management Planned: Mask  Additional Equipment:   Intra-op Plan:   Post-operative Plan:   Informed Consent: I have reviewed the patients History and Physical, chart, labs and discussed the procedure including the risks, benefits and alternatives for the proposed anesthesia with the patient or authorized representative who has indicated his/her understanding and acceptance.   Dental advisory given  Plan Discussed with: CRNA, Anesthesiologist and Surgeon  Anesthesia Plan Comments:         Anesthesia Quick Evaluation

## 2012-06-19 NOTE — Anesthesia Postprocedure Evaluation (Signed)
  Anesthesia Post-op Note  Patient: [name]  Procedure(s) Performed: Procedure(s) (LRB) with comments: CARDIOVERSION (N/A) - h/p from 11/8 in file drawer/dl  Patient Location: PACU and Endoscopy Unit  Anesthesia Type:MAC  Level of Consciousness: awake, alert  and oriented  Airway and Oxygen Therapy: Patient Spontanous Breathing and Patient connected to nasal cannula oxygen  Post-op Pain: none  Post-op Assessment: Post-op Vital signs reviewed and Patient's Cardiovascular Status Stable  Post-op Vital Signs: Reviewed and stable  Complications: No apparent anesthesia complications

## 2012-06-20 ENCOUNTER — Encounter (HOSPITAL_COMMUNITY): Payer: Self-pay | Admitting: Interventional Cardiology

## 2012-06-24 DIAGNOSIS — N3941 Urge incontinence: Secondary | ICD-10-CM | POA: Diagnosis not present

## 2012-06-25 DIAGNOSIS — I10 Essential (primary) hypertension: Secondary | ICD-10-CM | POA: Diagnosis not present

## 2012-06-25 DIAGNOSIS — I4891 Unspecified atrial fibrillation: Secondary | ICD-10-CM | POA: Diagnosis not present

## 2012-06-25 DIAGNOSIS — I059 Rheumatic mitral valve disease, unspecified: Secondary | ICD-10-CM | POA: Diagnosis not present

## 2012-07-04 ENCOUNTER — Other Ambulatory Visit: Payer: Self-pay | Admitting: Internal Medicine

## 2012-07-04 DIAGNOSIS — Z1231 Encounter for screening mammogram for malignant neoplasm of breast: Secondary | ICD-10-CM

## 2012-07-10 DIAGNOSIS — R05 Cough: Secondary | ICD-10-CM | POA: Diagnosis not present

## 2012-07-10 DIAGNOSIS — R059 Cough, unspecified: Secondary | ICD-10-CM | POA: Diagnosis not present

## 2012-07-15 DIAGNOSIS — N3941 Urge incontinence: Secondary | ICD-10-CM | POA: Diagnosis not present

## 2012-08-05 DIAGNOSIS — N3941 Urge incontinence: Secondary | ICD-10-CM | POA: Diagnosis not present

## 2012-08-11 ENCOUNTER — Ambulatory Visit: Payer: Medicare Other

## 2012-08-11 DIAGNOSIS — I10 Essential (primary) hypertension: Secondary | ICD-10-CM | POA: Diagnosis not present

## 2012-08-11 DIAGNOSIS — I4891 Unspecified atrial fibrillation: Secondary | ICD-10-CM | POA: Diagnosis not present

## 2012-08-26 DIAGNOSIS — N3941 Urge incontinence: Secondary | ICD-10-CM | POA: Diagnosis not present

## 2012-08-27 ENCOUNTER — Ambulatory Visit
Admission: RE | Admit: 2012-08-27 | Discharge: 2012-08-27 | Disposition: A | Discharge status: Home / Self Care | Payer: Medicare Other | Source: Ambulatory Visit | Attending: Internal Medicine | Admitting: Internal Medicine

## 2012-08-27 DIAGNOSIS — Z1231 Encounter for screening mammogram for malignant neoplasm of breast: Secondary | ICD-10-CM | POA: Diagnosis not present

## 2012-09-04 ENCOUNTER — Encounter: Payer: Self-pay | Admitting: Internal Medicine

## 2012-09-04 ENCOUNTER — Telehealth: Payer: Self-pay | Admitting: Pharmacist

## 2012-09-04 ENCOUNTER — Ambulatory Visit (INDEPENDENT_AMBULATORY_CARE_PROVIDER_SITE_OTHER): Payer: Medicare Other | Admitting: Internal Medicine

## 2012-09-04 VITALS — BP 129/66 | HR 86 | Ht 63.5 in | Wt 139.8 lb

## 2012-09-04 DIAGNOSIS — Z8679 Personal history of other diseases of the circulatory system: Secondary | ICD-10-CM | POA: Diagnosis not present

## 2012-09-04 DIAGNOSIS — I4891 Unspecified atrial fibrillation: Secondary | ICD-10-CM | POA: Diagnosis not present

## 2012-09-04 NOTE — Patient Instructions (Addendum)
Your physician has requested that you have an echocardiogram. Echocardiography is a painless test that uses sound waves to create images of your heart. It provides your doctor with information about the size and shape of your heart and how well your heart's chambers and valves are working. This procedure takes approximately one hour. There are no restrictions for this procedure.---Dr Katrinka Blazing to arrange   Tikosyn load--Someone from our [facility] will call you

## 2012-09-04 NOTE — Progress Notes (Signed)
Primary Care Physician: Darnelle Bos, MD Referring Physician:  Verdis Prime MD   [name] is a 79 y.o. female with a h/o rheumatic heart disease s/p mitral valve repair and surgical MAZE who presents for EP consultation.  She developed progressive valvular heart disease and persistent atrial fibrillation.  She underwent surgical mitral valve repair and MAZE.  She developed afib post operatively and she tried amiodarone.  Unfortunately she could not tolerate amiodarone due to severe side effects of nausea and anorexia.  She then spontaneously converted to sinus rhythm.  She did well without recurrent symptoms of atrial arrhythmias until 10/13.  She reports symptoms of palpitations, fatigue, and dizziness.  She was found to have atypical atrial flutter at that time.  She was planned for cardioversion however due to LAA thrombus, this was not performed.  She was placed on xarelto and returned in December for cardioversion.  She was successfully cardioverted to sinus rhythm with significant improvement in fatigue and resolution of palpitations.  She describes the change as "night and day".  Unfortunately, she returned to atypical atrial flutter 5 or 6 weeks later.  She is now referred for further EP consultation.  Today, she denies symptoms of chest pain, shortness of breath, orthopnea, PND, lower extremity edema, dizziness, presyncope, syncope, or neurologic sequela. The patient is tolerating medications without difficulties and is otherwise without complaint today.   Past Medical History  Diagnosis Date  . Anxiety   . Atrial fibrillation     s/p MAZE 2010  . GERD (gastroesophageal reflux disease)   . Complication of anesthesia     Nausea from versed  . Atypical atrial flutter   . History of mitral valve repair     Mitral valve prolapse, surgery in 2010  . Rheumatic fever     age 16  . Cancer     1998 Right breast cancer with lumpectomy and radiation therapy  . Arthritis     Some  in neck and back, but "can't complain"   Past Surgical History  Procedure Laterality Date  . Mitral valve repair  June 2010  . Cataract extraction    . Kidney cyst removal    . Breast surgery      1998 Lumpectomy in right breast  . Back surgery      Synovial cyst on spine, removed laproscopically in 2000?  [name] Eye surgery      Cataract Surgery, both eyes  . Appendectomy    . Tonsillectomy    . Cesarean section      Three C-Sections  . Tee without cardioversion  04/29/2012    Procedure: TRANSESOPHAGEAL ECHOCARDIOGRAM (TEE);  Surgeon: Quintella Reichert, MD;  Location: [facility] ENDOSCOPY;  Service: Cardiovascular;  Laterality: N/A;  . Cardioversion  04/29/2012    Procedure: CARDIOVERSION;  Surgeon: Quintella Reichert, MD;  Location: [facility] ENDOSCOPY;  Service: Cardiovascular;  Laterality: N/A;  . Cardioversion  06/19/2012    Procedure: CARDIOVERSION;  Surgeon: Lesleigh Noe, MD;  Location: [facility] ENDOSCOPY;  Service: Cardiovascular;  Laterality: N/A;  h/p from 11/8 in file drawer/dl    Current Outpatient Prescriptions  Medication Sig Dispense Refill  . atenolol (TENORMIN) 50 MG tablet Take 1 tablet (50 mg total) by mouth daily.  30 tablet  0  . cholecalciferol (VITAMIN D) 1000 UNITS tablet Take 1,000 Units by mouth daily.      . fish oil-omega-3 fatty acids 1000 MG capsule Take 1 g by mouth daily.      [name]  LORazepam (ATIVAN) 0.5 MG tablet Take 0.5 mg by mouth at bedtime as needed. For sleep      . neomycin-polymyxin-hydrocortisone (CORTISPORIN) otic solution Place 4-5 drops into the right ear 2 (two) times daily. For 5 days      . OVER THE COUNTER MEDICATION Take 1 each by mouth daily. Hemp heart seeds      . OVER THE COUNTER MEDICATION Take 1 each by mouth daily. Greens plus supplement      . polyethylene glycol (MIRALAX / GLYCOLAX) packet Take 17 g by mouth 2 (two) times daily.  14 each    . polyvinyl alcohol (LIQUIFILM TEARS) 1.4 % ophthalmic solution Place 1 drop into both eyes as needed. For dry eyes       . Rivaroxaban (XARELTO) 20 MG TABS Take 20 mg by mouth daily.      . vitamin C (ASCORBIC ACID) 500 MG tablet Take 500 mg by mouth daily.       No current facility-administered medications for this visit.    Allergies  Allergen Reactions  . Amoxicillin Nausea And Vomiting  . Amoxicillin-Pot Clavulanate Nausea And Vomiting  . Codeine Nausea And Vomiting  . Diuretic (Buchu-Cornsilk-Ch Grass-Hydran)     Pharmacy record  . Iohexol Hives     Code: HIVES, Desc: **11/22/08 **NO REACTION W/ OMNI 300// PT S/P 3 CT SCANS W/ IV CM AND NO HIVES/MMS**PT STATED HIVES 16 YRS AGO. POSSIBLE IV DYE W/ CT/XRAY. MEDICATED W/ 50MG  OF BENEDRYL PO PRIOR TO SCAN, Onset Date: [id number]   . Midazolam Hcl Nausea And Vomiting  . Quinolones Other (See Comments)    Unknown reaction  . Sulfa Antibiotics Other (See Comments)    Flu like symptoms 'serum sickness'  . Thioxanthenes Other (See Comments)    Flu like symptoms 'serum sickness'  . Valium Nausea And Vomiting  . Versed (Midazolam) Nausea And Vomiting    History   Social History  . Marital Status: Widowed    Spouse Name: N/A    Number of Children: N/A  . Years of Education: N/A   Occupational History  . Not on file.   Social History Main Topics  . Smoking status: Never Smoker   . Smokeless tobacco: Not on file  . Alcohol Use: No  . Drug Use: No  . Sexually Active: No     Comment: Smoked one cigarette a day as a teen   Other Topics Concern  . Not on file   Social History Narrative   Pt lives in [place] alone.  Homemaker.  Widowed    Family History  Problem Relation Age of Onset  . Other Sister     ROS- All systems are reviewed and negative except as per the HPI above  Physical Exam: Filed Vitals:   09/04/12 1036  BP: 129/66  Pulse: 86  Height: 5' 3.5" (1.613 m)  Weight: 139 lb 12.8 oz (63.413 kg)    GEN- The patient is elderly appearing, alert and oriented x 3 today.   Head- normocephalic, atraumatic Eyes-  Sclera  clear, conjunctiva pink Ears- hearing intact Oropharynx- clear Neck- supple, no JVP Lymph- no cervical lymphadenopathy Lungs- Clear to ausculation bilaterally, normal work of breathing Heart- irregular rate and rhythm, no murmurs, rubs or gallops, PMI not laterally displaced GI- soft, NT, ND, + BS Extremities- no clubbing, cyanosis, or edema MS- no significant deformity or atrophy Skin- no rash or lesion Psych- euthymic mood, full affect Neuro- strength and sensation are intact  EKG today reveals  atypical atrial flutter, V rate 105 bpm, nonspecific ST/ T changes  Assessment and Plan:

## 2012-09-04 NOTE — Telephone Encounter (Signed)
Spoke to pt regarding upcoming Tikosyn load/hospitalization.  She will contact Dr. Michaelle Copas office to confirm date of ECHO and call [facility] with date.  We will schedule office visit for labs and pre-Tikosyn education after completing her ECHO.   I have contacted [facility] - pt copay will be $95/month.  I will confirm ability to pay when she calls to schedule office visit.

## 2012-09-07 NOTE — Assessment & Plan Note (Signed)
As above.

## 2012-09-07 NOTE — Assessment & Plan Note (Addendum)
The patient has symptomatic persistent atrial fibrillation and atypical atrial flutter.  She has a h/o rheumatic heart disease and prior MAZE with mitral valve repair.  She has not had an echo recently. I think that this is important to assess the state of her structural heart disease, particularly left atrial size.  This will help in our determination long term about treatment options.   Her atrial flutter is atypical and likely left atrial in origin.  Given her advanced age, her risks of ablation are increased and I think that success rates are also reduced. I will ask Dr Katrinka Blazing to obtain an echo and forward to me. Therapeutic strategies for atrial arrhythmias post MAZE including medicine and ablation were discussed in detail with the patient today.  We discussed other medical options including tikosyn.  We discussed catheter ablation as well as the option of placing a pacemaker (possibly leadless)  with AV nodal ablation.  At this time, she would prefer medical therapy.  I will therefore stop amiodarone at this time.  We will schedule for initiation of tikosyn once her amiodarone level is within the acceptable range.  I will have her seen in our tikosyn clinic to arrange this. I will see her again in 2-3 months.  Continue long term anticoagulation.

## 2012-09-09 ENCOUNTER — Telehealth: Payer: Self-pay | Admitting: Pharmacist

## 2012-09-09 NOTE — Telephone Encounter (Signed)
Pt called to tell us ECHO planned for Wed 3/12 at [facility].  We will plan hospitalization for Tikosyn initiation the following week.  She was informed of the $95 monthly copay and she was alarmed.  She would like to speak to Dr. Johney Frame about options.   Will you please call her.   Thanks

## 2012-09-10 ENCOUNTER — Telehealth: Payer: Self-pay | Admitting: Internal Medicine

## 2012-09-10 DIAGNOSIS — I4892 Unspecified atrial flutter: Secondary | ICD-10-CM | POA: Diagnosis not present

## 2012-09-10 NOTE — Telephone Encounter (Signed)
New Problem:    Patient called in wanting to follow-up on the call she placed yesterday.  Please call back before 3pm after she will be at an appointment to have her ECHO.

## 2012-09-11 NOTE — Telephone Encounter (Signed)
lmom for patient that Dr Johney Frame wants her to make an appointment in 5 weeks with him to discuss her options.  Charlestine Night D is checking to see if the patient will qualify for an assistance program for the Tikosyn.

## 2012-09-15 ENCOUNTER — Telehealth: Payer: Self-pay | Admitting: Pharmacist

## 2012-09-15 NOTE — Telephone Encounter (Signed)
Spoke to pt who called last week with concerns regarding Tikosyn initiation.  At that time her concern was the $95 co-pay.  Today she states she is more concerned about Tikosyn side effects than cost.  She is very sensitive to medications and feels that Tikosyn is not the right option for he.  There is a note in her chart for her to schedule an appt with Dr. Johney Frame to discuss her options and I have encouraged her to make and keep that appt.

## 2012-09-16 DIAGNOSIS — N3941 Urge incontinence: Secondary | ICD-10-CM | POA: Diagnosis not present

## 2012-09-17 ENCOUNTER — Other Ambulatory Visit: Payer: Self-pay | Admitting: Internal Medicine

## 2012-09-17 ENCOUNTER — Encounter: Payer: Self-pay | Admitting: Internal Medicine

## 2012-09-23 DIAGNOSIS — I10 Essential (primary) hypertension: Secondary | ICD-10-CM | POA: Diagnosis not present

## 2012-09-23 DIAGNOSIS — I4891 Unspecified atrial fibrillation: Secondary | ICD-10-CM | POA: Diagnosis not present

## 2012-09-23 DIAGNOSIS — I059 Rheumatic mitral valve disease, unspecified: Secondary | ICD-10-CM | POA: Diagnosis not present

## 2012-09-23 DIAGNOSIS — Z7901 Long term (current) use of anticoagulants: Secondary | ICD-10-CM | POA: Diagnosis not present

## 2012-10-02 ENCOUNTER — Encounter: Payer: Self-pay | Admitting: Internal Medicine

## 2012-10-02 ENCOUNTER — Ambulatory Visit (INDEPENDENT_AMBULATORY_CARE_PROVIDER_SITE_OTHER): Payer: Medicare Other | Admitting: Internal Medicine

## 2012-10-02 VITALS — BP 124/74 | HR 72 | Ht 63.5 in | Wt 136.0 lb

## 2012-10-02 DIAGNOSIS — I4891 Unspecified atrial fibrillation: Secondary | ICD-10-CM | POA: Diagnosis not present

## 2012-10-02 NOTE — Progress Notes (Signed)
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  Since last being seen in our clinic, the patient reports doing very well.  She reports that she has "good days and bad days".  Her afib is no worse off of amiodarone.  Today, she denies symptoms of palpitations, chest pain, shortness of breath,  lower extremity edema, dizziness, presyncope, or syncope.  The patient is otherwise without complaint today.   Past Medical History  Diagnosis Date  . Anxiety   . Atrial fibrillation     s/p MAZE 2010  . GERD (gastroesophageal reflux disease)   . Complication of anesthesia     Nausea from versed  . Atypical atrial flutter   . History of mitral valve repair     Mitral valve prolapse, surgery in 2010  . Rheumatic fever     age 44  . Cancer     1998 Right breast cancer with lumpectomy and radiation therapy  . Arthritis     Some in neck and back, but "can't complain"   Past Surgical History  Procedure Laterality Date  . Mitral valve repair  June 2010  . Cataract extraction    . Kidney cyst removal    . Breast surgery      1998 Lumpectomy in right breast  . Back surgery      Synovial cyst on spine, removed laproscopically in 2000?  [name] Eye surgery      Cataract Surgery, both eyes  . Appendectomy    . Tonsillectomy    . Cesarean section      Three C-Sections  . Tee without cardioversion  04/29/2012    Procedure: TRANSESOPHAGEAL ECHOCARDIOGRAM (TEE);  Surgeon: Quintella Reichert, MD;  Location: [facility] ENDOSCOPY;  Service: Cardiovascular;  Laterality: N/A;  . Cardioversion  04/29/2012    Procedure: CARDIOVERSION;  Surgeon: Quintella Reichert, MD;  Location: [facility] ENDOSCOPY;  Service: Cardiovascular;  Laterality: N/A;  . Cardioversion  06/19/2012    Procedure: CARDIOVERSION;  Surgeon: Lesleigh Noe, MD;  Location: [facility] ENDOSCOPY;  Service: Cardiovascular;  Laterality: N/A;  h/p from 11/8 in file  drawer/dl    Current Outpatient Prescriptions  Medication Sig Dispense Refill  . Ascorbic Acid (VITAMIN C) 1000 MG tablet Take 1,000 mg by mouth daily.      [name] atenolol (TENORMIN) 50 MG tablet Take 1 tablet (50 mg total) by mouth daily.  30 tablet  0  . cholecalciferol (VITAMIN D) 1000 UNITS tablet Take 1,000 Units by mouth daily.      . Cyanocobalamin (VITAMIN B-12) 1000 MCG SUBL Place 1,000 mcg under the tongue.      . fish oil-omega-3 fatty acids 1000 MG capsule Take 1 g by mouth daily.      [name] LORazepam (ATIVAN) 0.5 MG tablet Take 0.5 mg by mouth at bedtime as needed. For sleep      . Magnesium 200 MG TABS Take 200 mg by mouth daily.      [name] OVER THE COUNTER MEDICATION Take 1 each by mouth daily. Hemp heart seeds      . OVER THE COUNTER MEDICATION Take 1 each by mouth daily. Greens plus supplement      . polyethylene glycol (MIRALAX / GLYCOLAX) packet Take 17 g by mouth 2 (two) times daily.  14 each    . polyvinyl alcohol (LIQUIFILM TEARS) 1.4 % ophthalmic solution Place 1 drop into both eyes as  needed. For dry eyes      . Probiotic Product (PROBIOTIC DAILY PO) Take by mouth daily. 10 Billi CFU      . Rivaroxaban (XARELTO) 20 MG TABS Take 20 mg by mouth daily.       No current facility-administered medications for this visit.    Physical Exam: Filed Vitals:   10/02/12 1147  BP: 124/74  Pulse: 72  Height: 5' 3.5" (1.613 m)  Weight: 136 lb (61.689 kg)    GEN- The patient is well appearing, alert and oriented x 3 today.   Head- normocephalic, atraumatic Eyes-  Sclera clear, conjunctiva pink Ears- hearing intact Oropharynx- clear Lungs- Clear to ausculation bilaterally, normal work of breathing Heart- Regular rate and rhythm, no murmurs, rubs or gallops, PMI not laterally displaced GI- soft, NT, ND, + BS Extremities- no clubbing, cyanosis, or edema  ekg today reveals sinus rhythm 72 bpm, PR 202, Qtc 453  Assessment and Plan:

## 2012-10-02 NOTE — Patient Instructions (Addendum)
Your physician recommends that you schedule a follow-up appointment in 3 months with Dr Allred    

## 2012-10-02 NOTE — Assessment & Plan Note (Addendum)
The patient has symptomatic persistent atrial fibrillation and atypical atrial flutter.  She has a h/o rheumatic heart disease and prior MAZE with mitral valve repair.  Her only antiarrhythmic option at this point in [place].   She would be a poor candidate for ablation Therapeutic strategies for atrial arrhythmias post MAZE including medicine and ablation were discussed in detail with the patient today.  We discussed other medical options including tikosyn.  She has read the side effect profile of tikosyn and wishes to avoid this medicine presently. She feels that she is doing reasonably well and prefer to continue her current strategy going forward. She will follow-up with Dr Katrinka Blazing and contact me if she decides to consider tikosyn in the future.

## 2012-10-08 DIAGNOSIS — N3941 Urge incontinence: Secondary | ICD-10-CM | POA: Diagnosis not present

## 2012-10-08 DIAGNOSIS — N3946 Mixed incontinence: Secondary | ICD-10-CM | POA: Diagnosis not present

## 2012-10-13 ENCOUNTER — Ambulatory Visit: Payer: Medicare Other | Admitting: Internal Medicine

## 2012-10-28 DIAGNOSIS — I10 Essential (primary) hypertension: Secondary | ICD-10-CM | POA: Diagnosis not present

## 2012-10-28 DIAGNOSIS — N3941 Urge incontinence: Secondary | ICD-10-CM | POA: Diagnosis not present

## 2012-10-28 DIAGNOSIS — I4891 Unspecified atrial fibrillation: Secondary | ICD-10-CM | POA: Diagnosis not present

## 2012-11-18 DIAGNOSIS — N3941 Urge incontinence: Secondary | ICD-10-CM | POA: Diagnosis not present

## 2012-11-19 DIAGNOSIS — D234 Other benign neoplasm of skin of scalp and neck: Secondary | ICD-10-CM | POA: Diagnosis not present

## 2012-11-19 DIAGNOSIS — L570 Actinic keratosis: Secondary | ICD-10-CM | POA: Diagnosis not present

## 2012-12-02 DIAGNOSIS — H18419 Arcus senilis, unspecified eye: Secondary | ICD-10-CM | POA: Diagnosis not present

## 2012-12-09 DIAGNOSIS — N3941 Urge incontinence: Secondary | ICD-10-CM | POA: Diagnosis not present

## 2012-12-22 DIAGNOSIS — I4891 Unspecified atrial fibrillation: Secondary | ICD-10-CM | POA: Diagnosis not present

## 2012-12-25 DIAGNOSIS — Z1331 Encounter for screening for depression: Secondary | ICD-10-CM | POA: Diagnosis not present

## 2012-12-25 DIAGNOSIS — I4891 Unspecified atrial fibrillation: Secondary | ICD-10-CM | POA: Diagnosis not present

## 2012-12-25 DIAGNOSIS — Z0000 Encounter for general adult medical examination without abnormal findings: Secondary | ICD-10-CM | POA: Diagnosis not present

## 2012-12-25 DIAGNOSIS — M899 Disorder of bone, unspecified: Secondary | ICD-10-CM | POA: Diagnosis not present

## 2012-12-25 DIAGNOSIS — C50919 Malignant neoplasm of unspecified site of unspecified female breast: Secondary | ICD-10-CM | POA: Diagnosis not present

## 2012-12-25 DIAGNOSIS — M949 Disorder of cartilage, unspecified: Secondary | ICD-10-CM | POA: Diagnosis not present

## 2012-12-25 DIAGNOSIS — I059 Rheumatic mitral valve disease, unspecified: Secondary | ICD-10-CM | POA: Diagnosis not present

## 2012-12-30 DIAGNOSIS — N3941 Urge incontinence: Secondary | ICD-10-CM | POA: Diagnosis not present

## 2013-01-01 ENCOUNTER — Ambulatory Visit (INDEPENDENT_AMBULATORY_CARE_PROVIDER_SITE_OTHER): Payer: Medicare Other | Admitting: Internal Medicine

## 2013-01-01 ENCOUNTER — Encounter: Payer: Self-pay | Admitting: Internal Medicine

## 2013-01-01 VITALS — BP 128/73 | HR 80 | Ht 63.5 in | Wt 140.4 lb

## 2013-01-01 DIAGNOSIS — I4891 Unspecified atrial fibrillation: Secondary | ICD-10-CM | POA: Diagnosis not present

## 2013-01-01 NOTE — Progress Notes (Signed)
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  Since last being seen in our clinic, the patient reports doing very well.  She is maintaining sinus rhythm off of AAD therapy.  Her energy is stable.  She is pleased with her current health state. Today, she denies symptoms of palpitations, chest pain, shortness of breath,  lower extremity edema, dizziness, presyncope, or syncope.  The patient is otherwise without complaint today.   Past Medical History  Diagnosis Date  . Anxiety   . Atrial fibrillation     s/p MAZE 2010  . GERD (gastroesophageal reflux disease)   . Complication of anesthesia     Nausea from versed  . Atypical atrial flutter   . History of mitral valve repair     Mitral valve prolapse, surgery in 2010  . Rheumatic fever     age 29  . Cancer     1998 Right breast cancer with lumpectomy and radiation therapy  . Arthritis     Some in neck and back, but "can't complain"   Past Surgical History  Procedure Laterality Date  . Mitral valve repair  June 2010  . Cataract extraction    . Kidney cyst removal    . Breast surgery      1998 Lumpectomy in right breast  . Back surgery      Synovial cyst on spine, removed laproscopically in 2000?  [name] Eye surgery      Cataract Surgery, both eyes  . Appendectomy    . Tonsillectomy    . Cesarean section      Three C-Sections  . Tee without cardioversion  04/29/2012    Procedure: TRANSESOPHAGEAL ECHOCARDIOGRAM (TEE);  Surgeon: Quintella Reichert, MD;  Location: [facility] ENDOSCOPY;  Service: Cardiovascular;  Laterality: N/A;  . Cardioversion  04/29/2012    Procedure: CARDIOVERSION;  Surgeon: Quintella Reichert, MD;  Location: [facility] ENDOSCOPY;  Service: Cardiovascular;  Laterality: N/A;  . Cardioversion  06/19/2012    Procedure: CARDIOVERSION;  Surgeon: Lesleigh Noe, MD;  Location: [facility] ENDOSCOPY;  Service: Cardiovascular;  Laterality: N/A;   h/p from 11/8 in file drawer/dl    Current Outpatient Prescriptions  Medication Sig Dispense Refill  . Ascorbic Acid (VITAMIN C) 1000 MG tablet Take 1,000 mg by mouth daily.      [name] atenolol (TENORMIN) 50 MG tablet Take 1 tablet (50 mg total) by mouth daily.  30 tablet  0  . cholecalciferol (VITAMIN D) 1000 UNITS tablet Take 1,000 Units by mouth daily.      . Cyanocobalamin (VITAMIN B-12) 1000 MCG SUBL Place 1,000 mcg under the tongue.      . fish oil-omega-3 fatty acids 1000 MG capsule Take 1 g by mouth daily.      [name] LORazepam (ATIVAN) 0.5 MG tablet Take 0.5 mg by mouth at bedtime as needed. For sleep      . Magnesium 200 MG TABS Take 200 mg by mouth daily.      [name] OVER THE COUNTER MEDICATION Take 1 each by mouth daily. Hemp heart seeds      . OVER THE COUNTER MEDICATION Take 1 each by mouth daily. Greens plus supplement      . polyethylene glycol (MIRALAX / GLYCOLAX) packet Take 17 g by mouth 2 (two) times daily.  14 each    . polyvinyl alcohol (LIQUIFILM TEARS) 1.4 % ophthalmic solution Place 1 drop into  both eyes as needed. For dry eyes      . Probiotic Product (PROBIOTIC DAILY PO) Take by mouth daily. 10 Billi CFU      . Rivaroxaban (XARELTO) 20 MG TABS Take 20 mg by mouth daily.       No current facility-administered medications for this visit.    Physical Exam: Filed Vitals:   01/01/13 1009  BP: 128/73  Pulse: 80  Height: 5' 3.5" (1.613 m)  Weight: 140 lb 6.4 oz (63.685 kg)    GEN- The patient is well appearing, alert and oriented x 3 today.   Head- normocephalic, atraumatic Eyes-  Sclera clear, conjunctiva pink Ears- hearing intact Oropharynx- clear Lungs- Clear to ausculation bilaterally, normal work of breathing Heart- Regular rate and rhythm, no murmurs, rubs or gallops, PMI not laterally displaced GI- soft, NT, ND, + BS Extremities- no clubbing, cyanosis, or edema  ekg today reveals sinus rhythm PR 226  Assessment and Plan:  1. afib Stable at this point She  would be a candidate for tikosyn but wishes to avoid further therapy at this point.  She did not tolerate amiodarone previously She is appropriately anticoagulated with xarelto  No changes at this point I will return her care to Dr Katrinka Blazing I am happy to see her again as needed if her arrhythmias become more of an issue

## 2013-01-19 DIAGNOSIS — IMO0002 Reserved for concepts with insufficient information to code with codable children: Secondary | ICD-10-CM | POA: Diagnosis not present

## 2013-01-19 DIAGNOSIS — M999 Biomechanical lesion, unspecified: Secondary | ICD-10-CM | POA: Diagnosis not present

## 2013-01-19 DIAGNOSIS — Q762 Congenital spondylolisthesis: Secondary | ICD-10-CM | POA: Diagnosis not present

## 2013-01-20 DIAGNOSIS — Z7901 Long term (current) use of anticoagulants: Secondary | ICD-10-CM | POA: Diagnosis not present

## 2013-01-20 DIAGNOSIS — I4891 Unspecified atrial fibrillation: Secondary | ICD-10-CM | POA: Diagnosis not present

## 2013-01-20 DIAGNOSIS — I059 Rheumatic mitral valve disease, unspecified: Secondary | ICD-10-CM | POA: Diagnosis not present

## 2013-01-20 DIAGNOSIS — I5033 Acute on chronic diastolic (congestive) heart failure: Secondary | ICD-10-CM | POA: Diagnosis not present

## 2013-01-30 ENCOUNTER — Ambulatory Visit
Admission: RE | Admit: 2013-01-30 | Discharge: 2013-01-30 | Disposition: A | Discharge status: Home / Self Care | Payer: Medicare Other | Source: Ambulatory Visit | Attending: Internal Medicine | Admitting: Internal Medicine

## 2013-01-30 DIAGNOSIS — S2249XA Multiple fractures of ribs, unspecified side, initial encounter for closed fracture: Secondary | ICD-10-CM | POA: Diagnosis not present

## 2013-01-30 DIAGNOSIS — R911 Solitary pulmonary nodule: Secondary | ICD-10-CM

## 2013-02-06 ENCOUNTER — Other Ambulatory Visit: Payer: Self-pay | Admitting: Dermatology

## 2013-02-06 DIAGNOSIS — L819 Disorder of pigmentation, unspecified: Secondary | ICD-10-CM | POA: Diagnosis not present

## 2013-02-06 DIAGNOSIS — D1801 Hemangioma of skin and subcutaneous tissue: Secondary | ICD-10-CM | POA: Diagnosis not present

## 2013-02-06 DIAGNOSIS — L820 Inflamed seborrheic keratosis: Secondary | ICD-10-CM | POA: Diagnosis not present

## 2013-02-06 DIAGNOSIS — L570 Actinic keratosis: Secondary | ICD-10-CM | POA: Diagnosis not present

## 2013-02-06 DIAGNOSIS — D239 Other benign neoplasm of skin, unspecified: Secondary | ICD-10-CM | POA: Diagnosis not present

## 2013-02-06 DIAGNOSIS — D485 Neoplasm of uncertain behavior of skin: Secondary | ICD-10-CM | POA: Diagnosis not present

## 2013-02-06 DIAGNOSIS — L821 Other seborrheic keratosis: Secondary | ICD-10-CM | POA: Diagnosis not present

## 2013-02-10 DIAGNOSIS — N3941 Urge incontinence: Secondary | ICD-10-CM | POA: Diagnosis not present

## 2013-03-03 DIAGNOSIS — N3941 Urge incontinence: Secondary | ICD-10-CM | POA: Diagnosis not present

## 2013-03-13 DIAGNOSIS — R300 Dysuria: Secondary | ICD-10-CM | POA: Diagnosis not present

## 2013-03-24 DIAGNOSIS — N3941 Urge incontinence: Secondary | ICD-10-CM | POA: Diagnosis not present

## 2013-03-24 DIAGNOSIS — R300 Dysuria: Secondary | ICD-10-CM | POA: Diagnosis not present

## 2013-03-30 DIAGNOSIS — N390 Urinary tract infection, site not specified: Secondary | ICD-10-CM | POA: Diagnosis not present

## 2013-04-14 DIAGNOSIS — N3941 Urge incontinence: Secondary | ICD-10-CM | POA: Diagnosis not present

## 2013-04-14 DIAGNOSIS — N390 Urinary tract infection, site not specified: Secondary | ICD-10-CM | POA: Diagnosis not present

## 2013-04-14 DIAGNOSIS — R3915 Urgency of urination: Secondary | ICD-10-CM | POA: Diagnosis not present

## 2013-04-14 DIAGNOSIS — R350 Frequency of micturition: Secondary | ICD-10-CM | POA: Diagnosis not present

## 2013-04-21 DIAGNOSIS — N3020 Other chronic cystitis without hematuria: Secondary | ICD-10-CM | POA: Diagnosis not present

## 2013-04-21 DIAGNOSIS — N3946 Mixed incontinence: Secondary | ICD-10-CM | POA: Diagnosis not present

## 2013-04-27 DIAGNOSIS — M999 Biomechanical lesion, unspecified: Secondary | ICD-10-CM | POA: Diagnosis not present

## 2013-04-27 DIAGNOSIS — IMO0002 Reserved for concepts with insufficient information to code with codable children: Secondary | ICD-10-CM | POA: Diagnosis not present

## 2013-04-27 DIAGNOSIS — Q762 Congenital spondylolisthesis: Secondary | ICD-10-CM | POA: Diagnosis not present

## 2013-04-30 DIAGNOSIS — N390 Urinary tract infection, site not specified: Secondary | ICD-10-CM | POA: Diagnosis not present

## 2013-04-30 DIAGNOSIS — R059 Cough, unspecified: Secondary | ICD-10-CM | POA: Diagnosis not present

## 2013-04-30 DIAGNOSIS — R05 Cough: Secondary | ICD-10-CM | POA: Diagnosis not present

## 2013-05-05 DIAGNOSIS — N3941 Urge incontinence: Secondary | ICD-10-CM | POA: Diagnosis not present

## 2013-05-05 DIAGNOSIS — N3020 Other chronic cystitis without hematuria: Secondary | ICD-10-CM | POA: Diagnosis not present

## 2013-05-11 ENCOUNTER — Telehealth: Payer: Self-pay | Admitting: Interventional Cardiology

## 2013-05-11 NOTE — Telephone Encounter (Signed)
New Problem  Pt states that she is having heart arrhythmia issues and requests a sooner appt/// next appt for [name] is 11/13// please assist

## 2013-05-11 NOTE — Telephone Encounter (Signed)
returned pt call adv her pt sts that she had been on antibiotics for a uti and she is currently back in afib. pt heartrate has beeb around 85bpm no sob no chest pain. pt sts that she is very low on energy. adv her I would let Alicia"O B" no that it is ok to make her an appt sometime this week and call her with a date and time.pt verbalized understanding

## 2013-05-13 ENCOUNTER — Ambulatory Visit (INDEPENDENT_AMBULATORY_CARE_PROVIDER_SITE_OTHER): Payer: Medicare Other | Admitting: Interventional Cardiology

## 2013-05-13 ENCOUNTER — Encounter: Payer: Self-pay | Admitting: Interventional Cardiology

## 2013-05-13 VITALS — BP 112/76 | HR 78 | Ht 62.0 in | Wt 139.0 lb

## 2013-05-13 DIAGNOSIS — Z9889 Other specified postprocedural states: Secondary | ICD-10-CM

## 2013-05-13 DIAGNOSIS — Z8679 Personal history of other diseases of the circulatory system: Secondary | ICD-10-CM

## 2013-05-13 DIAGNOSIS — I4891 Unspecified atrial fibrillation: Secondary | ICD-10-CM

## 2013-05-13 DIAGNOSIS — Z5181 Encounter for therapeutic drug level monitoring: Secondary | ICD-10-CM

## 2013-05-13 DIAGNOSIS — Z7901 Long term (current) use of anticoagulants: Secondary | ICD-10-CM

## 2013-05-13 NOTE — Patient Instructions (Addendum)
No changes were made today  Please call us back with your decision to start Tikosyn and to have a cardioversion so we will know how to proceed [phone]

## 2013-05-13 NOTE — Progress Notes (Signed)
Patient ID: [name], female   DOB: 24-Aug-1925, 80 y.o.   MRN: [id number]    [address] [place], [place]  [address] Phone: [phone] Fax:  [phone]  Date:  05/13/2013   ID:  [name], DOB [birth year], MRN [id number]  PCP:  Darnelle Bos, MD   ASSESSMENT:  1. Recurrent atrial fibrillation, now persistent for at least 4 weeks. Patient believes she developed atrial fibrillation earlier this fall when she was sick with a urinary tract infection. 3 previous electrical cardioversions. Patient was unable tolerate amiodarone. The patient refused Tikosyn therapy. 2. History of mitral valve repair with Maze procedure 2010 3. Chronic anticoagulation with Xarelto 4. History of atrial flutter  PLAN:  1. With a long conversation concerning management from this point. The patient has had a Maze procedure. She's had 3 electrical cardioversions. She does not want to be on antiarrhythmic therapy. She was unable tolerate amiodarone and has refused take his. A long conversation about rhythm control versus rate control. She was requesting that an electrical cardioversion be performed. I have dictated against any further cardioversion if we are not going to add a medication to help maintain normal sinus rhythm. She will discuss treatment options with her family which would include no further aggressive therapy for rhythm control versus hospitalization for Tikosyn loading and eventual electrical cardioversion. 2. No change in current therapy until further guidance from the patient   SUBJECTIVE: [name] is a 79 y.o. female is accompanied by her son. She states that between 4 and 6 weeks ago, during a urinary tract infection and multiple antibiotic changes by her primary physician, she believes her heart was out of rhythm. It was at this time that she began feeling fatigued and noticed lower extremity swelling. She did not contact me about this right away. Rather, she has a 2  to therapy advocated by Dr. Mel Almond which included magnesium and coenzyme Q10. She feels that this has significantly helped lower extremity edema but exertional fatigue has persisted. She denies palpitations. There've been no neurological complaints. No bleeding on Coumadin. EKG today confirms atrial fibrillation with rate control.   Wt Readings from Last 3 Encounters:  05/13/13 139 lb (63.05 kg)  01/01/13 140 lb 6.4 oz (63.685 kg)  10/02/12 136 lb (61.689 kg)     Past Medical History  Diagnosis Date  . Anxiety   . Atrial fibrillation     s/p MAZE 2010  . GERD (gastroesophageal reflux disease)   . Complication of anesthesia     Nausea from versed  . Atypical atrial flutter   . History of mitral valve repair     Mitral valve prolapse, surgery in 2010  . Rheumatic fever     age 27  . Cancer     1998 Right breast cancer with lumpectomy and radiation therapy  . Arthritis     Some in neck and back, but "can't complain"    Current Outpatient Prescriptions  Medication Sig Dispense Refill  . apixaban (ELIQUIS) 2.5 MG TABS tablet Take 2.5 mg by mouth 2 (two) times daily.      . Ascorbic Acid (VITAMIN C) 1000 MG tablet Take 1,000 mg by mouth daily.      [name] atenolol (TENORMIN) 50 MG tablet Take 1 tablet (50 mg total) by mouth daily.  30 tablet  0  . cholecalciferol (VITAMIN D) 1000 UNITS tablet Take 1,000 Units by mouth daily.      . Coenzyme Q10  150 MG CAPS Take by mouth 2 times daily at 12 noon and 4 pm.      . Cyanocobalamin (VITAMIN B-12) 1000 MCG SUBL Place 1,000 mcg under the tongue.      [name] Deoxyribose (DEOXY-D-RIBOSE) POWD by Does not apply route. Take once daily      . LORazepam (ATIVAN) 0.5 MG tablet Take 0.5 mg by mouth at bedtime as needed. For sleep      . Magnesium 200 MG TABS Take two tablet in the morning and two tablet in the evening      . OVER THE COUNTER MEDICATION Take 1 each by mouth daily. Hemp heart seeds      . OVER THE COUNTER MEDICATION Take 1 each by mouth daily.  Greens plus supplement      . polyethylene glycol (MIRALAX / GLYCOLAX) packet Take 17 g by mouth 2 (two) times daily.  14 each    . polyvinyl alcohol (LIQUIFILM TEARS) 1.4 % ophthalmic solution Place 1 drop into both eyes as needed. For dry eyes      . Probiotic Product (PROBIOTIC DAILY PO) Take by mouth daily. 10 Billi CFU       No current facility-administered medications for this visit.    Allergies:    Allergies  Allergen Reactions  . Amoxicillin Nausea And Vomiting  . Amoxicillin-Pot Clavulanate Nausea And Vomiting  . Codeine Nausea And Vomiting  . Diuretic [Buchu-Cornsilk-Ch Grass-Hydran]     Pharmacy record  . Iohexol Hives     Code: HIVES, Desc: **11/22/08 **NO REACTION W/ OMNI 300// PT S/P 3 CT SCANS W/ IV CM AND NO HIVES/MMS**PT STATED HIVES 16 YRS AGO. POSSIBLE IV DYE W/ CT/XRAY. MEDICATED W/ 50MG  OF BENEDRYL PO PRIOR TO SCAN, Onset Date: 16109604   . Midazolam Hcl Nausea And Vomiting  . Quinolones Other (See Comments)    Flu like symptoms  . Sulfa Antibiotics Other (See Comments)    Flu like symptoms 'serum sickness'  . Thioxanthenes Other (See Comments)    Flu like symptoms 'serum sickness'  . Valium Nausea And Vomiting  . Versed [Midazolam] Nausea And Vomiting    Social History:  The patient  reports that she has never smoked. She does not have any smokeless tobacco history on file. She reports that she does not drink alcohol or use illicit drugs.   ROS:  Please see the history of present illness.   Appetite is stable. Denies chills and fever. No weight loss. She denies orthopnea and ankle edema currently. No urinary, GI, or other bleeding   All other systems reviewed and negative.   OBJECTIVE: VS:  BP 112/76  Pulse 78  Ht 5\' 2"  (1.575 m)  Wt 139 lb (63.05 kg)  BMI 25.42 kg/m2 Well nourished, well developed, in no acute distress, appearing younger than her stated age HEENT: normal Neck: JVD flat. Carotid bruit absent  Cardiac:  normal S1, S2; IIRR; no  murmur Lungs:  clear to auscultation bilaterally, no wheezing, rhonchi or rales Abd: soft, nontender, no hepatomegaly Ext: Edema absent. Pulses 2+ bilateral Skin: warm and dry Neuro:  CNs 2-12 intact, no focal abnormalities noted  EKG:  Atrial fibrillation with controlled ventricular response and no acute ST-T wave change       Signed, Darci Needle III, MD 05/13/2013 2:49 PM

## 2013-05-18 DIAGNOSIS — N3020 Other chronic cystitis without hematuria: Secondary | ICD-10-CM | POA: Diagnosis not present

## 2013-05-18 DIAGNOSIS — R300 Dysuria: Secondary | ICD-10-CM | POA: Diagnosis not present

## 2013-05-26 DIAGNOSIS — N3941 Urge incontinence: Secondary | ICD-10-CM | POA: Diagnosis not present

## 2013-06-01 ENCOUNTER — Other Ambulatory Visit: Payer: Self-pay | Admitting: *Deleted

## 2013-06-01 DIAGNOSIS — I4891 Unspecified atrial fibrillation: Secondary | ICD-10-CM

## 2013-06-01 DIAGNOSIS — Z79899 Other long term (current) drug therapy: Secondary | ICD-10-CM

## 2013-06-09 DIAGNOSIS — H52229 Regular astigmatism, unspecified eye: Secondary | ICD-10-CM | POA: Diagnosis not present

## 2013-06-09 DIAGNOSIS — H5200 Hypermetropia, unspecified eye: Secondary | ICD-10-CM | POA: Diagnosis not present

## 2013-06-09 DIAGNOSIS — H21559 Recession of chamber angle, unspecified eye: Secondary | ICD-10-CM | POA: Diagnosis not present

## 2013-06-09 DIAGNOSIS — H40059 Ocular hypertension, unspecified eye: Secondary | ICD-10-CM | POA: Diagnosis not present

## 2013-06-16 DIAGNOSIS — N3941 Urge incontinence: Secondary | ICD-10-CM | POA: Diagnosis not present

## 2013-06-17 DIAGNOSIS — Z23 Encounter for immunization: Secondary | ICD-10-CM | POA: Diagnosis not present

## 2013-06-23 ENCOUNTER — Encounter: Payer: Self-pay | Admitting: Interventional Cardiology

## 2013-06-23 ENCOUNTER — Ambulatory Visit (INDEPENDENT_AMBULATORY_CARE_PROVIDER_SITE_OTHER): Payer: Medicare Other | Admitting: Interventional Cardiology

## 2013-06-23 ENCOUNTER — Other Ambulatory Visit (INDEPENDENT_AMBULATORY_CARE_PROVIDER_SITE_OTHER): Payer: Medicare Other

## 2013-06-23 VITALS — BP 126/82 | HR 66 | Ht 62.0 in | Wt 135.4 lb

## 2013-06-23 DIAGNOSIS — I4891 Unspecified atrial fibrillation: Secondary | ICD-10-CM

## 2013-06-23 DIAGNOSIS — Z8679 Personal history of other diseases of the circulatory system: Secondary | ICD-10-CM | POA: Diagnosis not present

## 2013-06-23 DIAGNOSIS — Z7901 Long term (current) use of anticoagulants: Secondary | ICD-10-CM

## 2013-06-23 DIAGNOSIS — Z9889 Other specified postprocedural states: Secondary | ICD-10-CM

## 2013-06-23 DIAGNOSIS — Z79899 Other long term (current) drug therapy: Secondary | ICD-10-CM | POA: Diagnosis not present

## 2013-06-23 DIAGNOSIS — Z5181 Encounter for therapeutic drug level monitoring: Secondary | ICD-10-CM

## 2013-06-23 LAB — RENAL FUNCTION PANEL
Albumin: 4.4 g/dL (ref 3.5–5.2)
BUN: 23 mg/dL (ref 6–23)
CO2: 22 mEq/L (ref 19–32)
Calcium: 9.3 mg/dL (ref 8.4–10.5)
Chloride: 98 mEq/L (ref 96–112)
Creatinine, Ser: 0.7 mg/dL (ref 0.4–1.2)
GFR: 88.76 mL/min (ref >60.00)
Glucose, Bld: 107 mg/dL — ABNORMAL HIGH (ref 70–99)
Phosphorus: 4.6 mg/dL (ref 2.3–4.6)
Potassium: 5.7 mEq/L — ABNORMAL HIGH (ref 3.5–5.1)
Sodium: 132 mEq/L — ABNORMAL LOW (ref 135–145)

## 2013-06-23 LAB — HEMOGLOBIN AND HEMATOCRIT, BLOOD
HCT: 40 % (ref 36.0–46.0)
Hemoglobin: 13.4 g/dL (ref 12.0–15.0)

## 2013-06-23 NOTE — Patient Instructions (Signed)
Your physician recommends that you continue on your current medications as directed. Please refer to the Current Medication list given to you today.  Your physician wants you to follow-up in: 6 months. You will receive a reminder letter in the mail two months in advance. If you don't receive a letter, please call our office to schedule the follow-up appointment.  

## 2013-06-23 NOTE — Progress Notes (Signed)
Patient ID: [name], female   DOB: 22-Jul-1925, 79 y.o.   MRN: [id number]    [address] [place], [place]  [address] Phone: [phone] Fax:  [phone]  Date:  06/23/2013   ID:  [name], DOB [birth year], MRN [id number]  PCP:  Darnelle Bos, MD   ASSESSMENT:  1. Recurrent atrial fibrillation/flutter, with rate control and improving tolerance clinically 2. Chronic oral anticoagulation with Apixiban 3. Mitral valve repair  PLAN:  1. Patient done as well as she is, I have advocated against rhythm control. We should continue her current medical regimen as is. Return to see me in 6 months. 2. Creatinine and hemoglobin will be obtained today to monitor chronic anticoagulation therapy.   SUBJECTIVE: [name] is a 79 y.o. female who states she has been doing relatively well over the past month or 2. Exertional fatigue is present but less severe. Quality of life has been adequate. She has not had syncope, chest pain, orthopnea, or PND. She does have exertional dyspnea greater than that experienced prior to redeveloping the arrhythmia. She has had no bleeding difficulty on her current medical regimen. She denies transient neurological symptoms to   Wt Readings from Last 3 Encounters:  06/23/13 135 lb 6.4 oz (61.417 kg)  05/13/13 139 lb (63.05 kg)  01/01/13 140 lb 6.4 oz (63.685 kg)     Past Medical History  Diagnosis Date  . Anxiety   . Atrial fibrillation     s/p MAZE 2010  . GERD (gastroesophageal reflux disease)   . Complication of anesthesia     Nausea from versed  . Atypical atrial flutter   . History of mitral valve repair     Mitral valve prolapse, surgery in 2010  . Rheumatic fever     age 61  . Cancer     1998 Right breast cancer with lumpectomy and radiation therapy  . Arthritis     Some in neck and back, but "can't complain"    Current Outpatient Prescriptions  Medication Sig Dispense Refill  . apixaban (ELIQUIS) 2.5 MG TABS  tablet Take 2.5 mg by mouth 2 (two) times daily.      . Ascorbic Acid (VITAMIN C) 1000 MG tablet Take 1,000 mg by mouth daily.      [name] atenolol (TENORMIN) 50 MG tablet Take 1 tablet (50 mg total) by mouth daily.  30 tablet  0  . cholecalciferol (VITAMIN D) 1000 UNITS tablet Take 1,000 Units by mouth daily.      . Coenzyme Q10 150 MG CAPS Take by mouth 2 times daily at 12 noon and 4 pm.      . Cyanocobalamin (VITAMIN B-12) 1000 MCG SUBL Place 1,000 mcg under the tongue.      [name] Deoxyribose (DEOXY-D-RIBOSE) POWD by Does not apply route. Take once daily      . LORazepam (ATIVAN) 0.5 MG tablet Take 0.5 mg by mouth at bedtime as needed. For sleep      . Magnesium 200 MG TABS Take two tablet in the morning and two tablet in the evening      . OVER THE COUNTER MEDICATION Take 1 each by mouth daily. Hemp heart seeds      . OVER THE COUNTER MEDICATION Take 1 each by mouth daily. Greens plus supplement      . polyethylene glycol (MIRALAX / GLYCOLAX) packet Take 17 g by mouth 2 (two) times daily.  14 each    .  polyvinyl alcohol (LIQUIFILM TEARS) 1.4 % ophthalmic solution Place 1 drop into both eyes as needed. For dry eyes      . Probiotic Product (PROBIOTIC DAILY PO) Take by mouth daily. 10 Billi CFU       No current facility-administered medications for this visit.    Allergies:    Allergies  Allergen Reactions  . Amiodarone     sick  . Amoxicillin Nausea And Vomiting  . Amoxicillin-Pot Clavulanate Nausea And Vomiting  . Aspirin     Unknown... 325 mg and more.  . Ciprofloxacin     Flu symptoms  . Codeine Nausea And Vomiting  . Diuretic [Buchu-Cornsilk-Ch Grass-Hydran]     Pharmacy record  . Iohexol Hives     Code: HIVES, Desc: **11/22/08 **NO REACTION W/ OMNI 300// PT S/P 3 CT SCANS W/ IV CM AND NO HIVES/MMS**PT STATED HIVES 16 YRS AGO. POSSIBLE IV DYE W/ CT/XRAY. MEDICATED W/ 50MG  OF BENEDRYL PO PRIOR TO SCAN, Onset Date: [id number]   . Lisinopril     cough  . Midazolam Hcl Nausea And  Vomiting  . Quinolones Other (See Comments)    Flu like symptoms  . Sotalol     Unknown   . Sulfa Antibiotics Other (See Comments)    Flu like symptoms 'serum sickness'  . Thioxanthenes Other (See Comments)    Flu like symptoms 'serum sickness'  . Valium Nausea And Vomiting  . Vancomycin     unknown  . Versed [Midazolam] Nausea And Vomiting    Social History:  The patient  reports that she has never smoked. She does not have any smokeless tobacco history on file. She reports that she does not drink alcohol or use illicit drugs.   ROS:  Please see the history of present illness.   All other systems reviewed and negative.   OBJECTIVE: VS:  BP 126/82  Pulse 66  Ht 5\' 2"  (1.575 m)  Wt 135 lb 6.4 oz (61.417 kg)  BMI 24.76 kg/m2 Well nourished, well developed, in no acute distress, appearing younger than her stated age HEENT: normal Neck: JVD flat. Carotid bruit absent  Cardiac:  normal S1, S2; IIRR; no murmur Lungs:  clear to auscultation bilaterally, no wheezing, rhonchi or rales Abd: soft, nontender, no hepatomegaly Ext: Edema absent. Pulses 2+ Skin: warm and dry Neuro:  CNs 2-12 intact, no focal abnormalities noted  EKG:  Not performed       Signed, Darci Needle III, MD 06/23/2013 2:52 PM

## 2013-06-25 ENCOUNTER — Telehealth: Payer: Self-pay

## 2013-06-25 ENCOUNTER — Other Ambulatory Visit: Payer: Self-pay | Admitting: Interventional Cardiology

## 2013-06-25 NOTE — Telephone Encounter (Signed)
Message copied by Jarvis Newcomer on Thu Jun 25, 2013 12:32 PM ------      Message from: Verdis Prime      Created: Tue Jun 23, 2013  5:53 PM       Potassium is elevated kidney function is normal and hemoglobin is normal. There is no explanation for the elevated potassium so it needs to be repeated to rule out lab error. ------

## 2013-06-25 NOTE — Telephone Encounter (Signed)
pt aware of lab results.Potassium is elevated kidney function is normal and hemoglobin is normal. There is no explanation for the elevated potassium so it needs to be repeated to rule out lab error.pt will come in on 06/29/13 to have bmet redrawn.pt sts that her lab was drawn with a "regular needle" and know her arm is all black and blue.pt sts that she request lab tech use a butterfly needle and was told "we don"t use those"the patient tor eturn for repeat bmet on 06/29/13 and request that her labs only be drawn with a butterfly needle.pt would like to make sure that [name], clinical service mgr is aware.adv pt I would let her know.pt verbalized understanding.

## 2013-06-25 NOTE — Telephone Encounter (Signed)
Message copied by Jarvis Newcomer on Thu Jun 25, 2013 11:51 AM ------      Message from: Verdis Prime      Created: Tue Jun 23, 2013  5:53 PM       Potassium is elevated kidney function is normal and hemoglobin is normal. There is no explanation for the elevated potassium so it needs to be repeated to rule out lab error. ------

## 2013-06-29 ENCOUNTER — Ambulatory Visit (INDEPENDENT_AMBULATORY_CARE_PROVIDER_SITE_OTHER): Payer: Medicare Other | Admitting: *Deleted

## 2013-06-29 DIAGNOSIS — I4891 Unspecified atrial fibrillation: Secondary | ICD-10-CM

## 2013-06-29 DIAGNOSIS — E875 Hyperkalemia: Secondary | ICD-10-CM | POA: Diagnosis not present

## 2013-06-29 LAB — BASIC METABOLIC PANEL
BUN: 17 mg/dL (ref 6–23)
CO2: 26 mEq/L (ref 19–32)
Calcium: 9.2 mg/dL (ref 8.4–10.5)
Chloride: 98 mEq/L (ref 96–112)
Creatinine, Ser: 0.6 mg/dL (ref 0.4–1.2)
GFR: 97.07 mL/min (ref >60.00)
Glucose, Bld: 87 mg/dL (ref 70–99)
Potassium: 4.1 mEq/L (ref 3.5–5.1)
Sodium: 134 mEq/L — ABNORMAL LOW (ref 135–145)

## 2013-06-30 ENCOUNTER — Telehealth: Payer: Self-pay

## 2013-06-30 NOTE — Telephone Encounter (Signed)
Message copied by Jarvis Newcomer on Tue Jun 30, 2013 12:59 PM ------      Message from: Verdis Prime      Created: Mon Jun 29, 2013  6:01 PM       Repeat potassium level is normal ------

## 2013-06-30 NOTE — Telephone Encounter (Signed)
pt given repearr lab results.Repeat potassium level is normal.pt verbalized understanding

## 2013-07-10 DIAGNOSIS — N3020 Other chronic cystitis without hematuria: Secondary | ICD-10-CM | POA: Diagnosis not present

## 2013-07-14 DIAGNOSIS — N3941 Urge incontinence: Secondary | ICD-10-CM | POA: Diagnosis not present

## 2013-07-28 ENCOUNTER — Other Ambulatory Visit: Payer: Self-pay

## 2013-07-28 DIAGNOSIS — Z9889 Other specified postprocedural states: Secondary | ICD-10-CM

## 2013-07-28 DIAGNOSIS — Z1231 Encounter for screening mammogram for malignant neoplasm of breast: Secondary | ICD-10-CM

## 2013-07-28 DIAGNOSIS — Z853 Personal history of malignant neoplasm of breast: Secondary | ICD-10-CM

## 2013-07-30 DIAGNOSIS — N3020 Other chronic cystitis without hematuria: Secondary | ICD-10-CM | POA: Diagnosis not present

## 2013-07-30 DIAGNOSIS — N390 Urinary tract infection, site not specified: Secondary | ICD-10-CM | POA: Diagnosis not present

## 2013-07-30 DIAGNOSIS — R300 Dysuria: Secondary | ICD-10-CM | POA: Diagnosis not present

## 2013-08-04 DIAGNOSIS — N3941 Urge incontinence: Secondary | ICD-10-CM | POA: Diagnosis not present

## 2013-08-28 ENCOUNTER — Ambulatory Visit: Payer: Medicare Other

## 2013-09-08 DIAGNOSIS — N3941 Urge incontinence: Secondary | ICD-10-CM | POA: Diagnosis not present

## 2013-09-08 DIAGNOSIS — N390 Urinary tract infection, site not specified: Secondary | ICD-10-CM | POA: Diagnosis not present

## 2013-09-11 ENCOUNTER — Ambulatory Visit
Admission: RE | Admit: 2013-09-11 | Discharge: 2013-09-11 | Disposition: A | Discharge status: Home / Self Care | Payer: Medicare Other | Source: Ambulatory Visit

## 2013-09-11 DIAGNOSIS — Z9889 Other specified postprocedural states: Secondary | ICD-10-CM

## 2013-09-11 DIAGNOSIS — Z853 Personal history of malignant neoplasm of breast: Secondary | ICD-10-CM

## 2013-09-11 DIAGNOSIS — Z1231 Encounter for screening mammogram for malignant neoplasm of breast: Secondary | ICD-10-CM | POA: Diagnosis not present

## 2013-09-29 DIAGNOSIS — N3941 Urge incontinence: Secondary | ICD-10-CM | POA: Diagnosis not present

## 2013-10-01 DIAGNOSIS — L259 Unspecified contact dermatitis, unspecified cause: Secondary | ICD-10-CM | POA: Diagnosis not present

## 2013-10-01 DIAGNOSIS — L570 Actinic keratosis: Secondary | ICD-10-CM | POA: Diagnosis not present

## 2013-10-20 DIAGNOSIS — N3941 Urge incontinence: Secondary | ICD-10-CM | POA: Diagnosis not present

## 2013-10-30 DIAGNOSIS — N3020 Other chronic cystitis without hematuria: Secondary | ICD-10-CM | POA: Diagnosis not present

## 2013-10-30 DIAGNOSIS — N3946 Mixed incontinence: Secondary | ICD-10-CM | POA: Diagnosis not present

## 2013-11-17 DIAGNOSIS — N3941 Urge incontinence: Secondary | ICD-10-CM | POA: Diagnosis not present

## 2013-11-25 ENCOUNTER — Telehealth: Payer: Self-pay | Admitting: Interventional Cardiology

## 2013-11-25 NOTE — Telephone Encounter (Signed)
called pt to adv her that the NP and PA schedule were full this afternoon.offered pt a nurse vist for 5/21.pt sts that she would rather she Dr.Smith. appt made for 5/22 @8am  with and EKG.pt did deny sweliing, I could her that the pt was having sob over the phone.pt aware of appt.pt adv to call back if symptoms worsen over the next 24 hrs.pt verbalized understanding

## 2013-11-25 NOTE — Telephone Encounter (Signed)
New message    No details information was given pt will explain when the nurse call

## 2013-11-25 NOTE — Telephone Encounter (Signed)
Follow up    Patient calling back to see if Dr. Tamala Julian was in the office today .    C/O couple of days not felt good. Felt like she going to past out. Nausea . Irregular heart rate.    Patient states she not having no chest pain , no sob  Patient stated she does not want to call 911 nor go the emergency room

## 2013-11-25 NOTE — Telephone Encounter (Signed)
returned call.pt sts that she ahs been feeling weak and nauseated the last couple of days.pt denies fever,vomitting, pt sts that she does have chronic afib and can feel her heart racing.pt would like to be seen today.adv pt that Dr.Smith is not in the office today.I will check the NP or PA schedule to see if anything is available, and call her back.pt verbalized understanding.

## 2013-11-27 ENCOUNTER — Ambulatory Visit (INDEPENDENT_AMBULATORY_CARE_PROVIDER_SITE_OTHER): Payer: Medicare Other | Admitting: Interventional Cardiology

## 2013-11-27 ENCOUNTER — Encounter: Payer: Self-pay | Admitting: Interventional Cardiology

## 2013-11-27 ENCOUNTER — Encounter (INDEPENDENT_AMBULATORY_CARE_PROVIDER_SITE_OTHER): Payer: Self-pay

## 2013-11-27 VITALS — BP 120/70 | HR 77 | Ht 62.0 in | Wt 134.0 lb

## 2013-11-27 DIAGNOSIS — Z7901 Long term (current) use of anticoagulants: Secondary | ICD-10-CM

## 2013-11-27 DIAGNOSIS — R55 Syncope and collapse: Secondary | ICD-10-CM

## 2013-11-27 DIAGNOSIS — I4891 Unspecified atrial fibrillation: Secondary | ICD-10-CM

## 2013-11-27 DIAGNOSIS — Z9889 Other specified postprocedural states: Secondary | ICD-10-CM

## 2013-11-27 DIAGNOSIS — Z5181 Encounter for therapeutic drug level monitoring: Secondary | ICD-10-CM | POA: Diagnosis not present

## 2013-11-27 NOTE — Progress Notes (Signed)
Patient ID: [name], female   DOB: 15-Feb-1926, 80 y.o.   MRN: [id number]    [address] [place], [place]  [address] Phone: [phone] Fax:  [phone]  Date:  11/27/2013   ID:  [name], DOB 1925-09-03, MRN [id number]  PCP:  Tara Finer, MD   ASSESSMENT:  1. Near-syncope and fatigue, uncertain cause. Rule out chronotropic incompetence in setting of atrial fibrillation 2. Atrial fibrillation, chronic 3. Mitral valve disease with mitral valve repair 4. Medication intolerance  PLAN:  1. I suspect that the patient has rhythm disturbance as the source of her complaints. I suspect chronotropic incompetence. We will perform 48 hour Holter monitor. I believe we will likely manage this by decreasing the intensity of beta blocker therapy but until we have data we will hold her medical regimen as listed   SUBJECTIVE: [name] is a 80 y.o. female who complains of exertional fatigue and near fainting. No dyspnea orthopnea. She denies chest pain. She has not had syncope.   Wt Readings from Last 3 Encounters:  11/27/13 134 lb (60.782 kg)  06/23/13 135 lb 6.4 oz (61.417 kg)  05/13/13 139 lb (63.05 kg)     Past Medical History  Diagnosis Date  . Anxiety   . Atrial fibrillation     s/p MAZE 2010  . GERD (gastroesophageal reflux disease)   . Complication of anesthesia     Nausea from versed  . Atypical atrial flutter   . History of mitral valve repair     Mitral valve prolapse, surgery in 2010  . Rheumatic fever     age 15  . Cancer     1998 Right breast cancer with lumpectomy and radiation therapy  . Arthritis     Some in neck and back, but "can't complain"    Current Outpatient Prescriptions  Medication Sig Dispense Refill  . apixaban (ELIQUIS) 2.5 MG TABS tablet Take 2.5 mg by mouth 2 (two) times daily.      . Ascorbic Acid (VITAMIN C) 1000 MG tablet Take 1,000 mg by mouth daily.      [name] atenolol (TENORMIN) 50 MG tablet TAKE 1 TABLET ONCE  DAILY.  30 tablet  5  . CHIA SEED PO Take 1 tablet by mouth.      . cholecalciferol (VITAMIN D) 1000 UNITS tablet Take 1,000 Units by mouth daily.      . Coenzyme Q10 150 MG CAPS Take by mouth 2 times daily at 12 noon and 4 pm.      . Cyanocobalamin (VITAMIN B-12) 1000 MCG SUBL Place 1,000 mcg under the tongue.      [name] Deoxyribose (DEOXY-D-RIBOSE) POWD by Does not apply route. Take once daily      . LORazepam (ATIVAN) 0.5 MG tablet Take 0.5 mg by mouth at bedtime as needed. For sleep      . Magnesium 200 MG TABS Take two tablet in the morning and two tablet in the evening      . OVER THE COUNTER MEDICATION Take 1 each by mouth daily. Hemp heart seeds      . OVER THE COUNTER MEDICATION Take 1 each by mouth daily. Greens plus supplement      . polyethylene glycol (MIRALAX / GLYCOLAX) packet Take 17 g by mouth 2 (two) times daily.  14 each    . polyvinyl alcohol (LIQUIFILM TEARS) 1.4 % ophthalmic solution Place 1 drop into both eyes as needed. For dry eyes      .  Probiotic Product (PROBIOTIC DAILY PO) Take by mouth daily. 10 Billi CFU       No current facility-administered medications for this visit.    Allergies:    Allergies  Allergen Reactions  . Amiodarone     sick  . Amoxicillin Nausea And Vomiting  . Amoxicillin-Pot Clavulanate Nausea And Vomiting  . Aspirin     Unknown... 325 mg and more.  . Ciprofloxacin     Flu symptoms  . Codeine Nausea And Vomiting  . Diuretic [Buchu-Cornsilk-Ch Grass-Hydran]     Pharmacy record  . Iohexol Hives     Code: HIVES, Desc: **11/22/08 **NO REACTION W/ OMNI 300// PT S/P 3 CT SCANS W/ IV CM AND NO HIVES/MMS**PT STATED HIVES 16 YRS AGO. POSSIBLE IV DYE W/ CT/XRAY. MEDICATED W/ 50MG  OF BENEDRYL PO PRIOR TO SCAN, Onset Date: [id number]   . Lisinopril     cough  . Midazolam Hcl Nausea And Vomiting  . Quinolones Other (See Comments)    Flu like symptoms  . Sotalol     Unknown   . Sulfa Antibiotics Other (See Comments)    Flu like symptoms 'serum  sickness'  . Thioxanthenes Other (See Comments)    Flu like symptoms 'serum sickness'  . Valium Nausea And Vomiting  . Vancomycin     unknown  . Versed [Midazolam] Nausea And Vomiting    Social History:  The patient  reports that she has never smoked. She does not have any smokeless tobacco history on file. She reports that she does not drink alcohol or use illicit drugs.   ROS:  Please see the history of present illness.   Weight loss   All other systems reviewed and negative.   OBJECTIVE: VS:  BP 120/70  Pulse 77  Ht 5\' 2"  (1.575 m)  Wt 134 lb (60.782 kg)  BMI 24.50 kg/m2 Well nourished, well developed, in no acute distress, elderly with no distress HEENT: normal Neck: JVD flat. Carotid bruit absent  Cardiac:  normal S1, S2; IIRR; no murmur Lungs:  clear to auscultation bilaterally, no wheezing, rhonchi or rales Abd: soft, nontender, no hepatomegaly Ext: Edema absent. Pulses 2+ and symmetric Skin: warm and dry Neuro:  CNs 2-12 intact, no focal abnormalities noted  EKG:  Normal sinus rhythm with superior axis and heart rate of 77 beats per minute       Signed, Illene Labrador III, MD 11/27/2013 8:43 AM

## 2013-11-27 NOTE — Patient Instructions (Signed)
Your physician recommends that you continue on your current medications as directed. Please refer to the Current Medication list given to you today.  Your physician has recommended that you wear a holter monitor. Holter monitors are medical devices that record the heart's electrical activity. Doctors most often use these monitors to diagnose arrhythmias. Arrhythmias are problems with the speed or rhythm of the heartbeat. The monitor is a small, portable device. You can wear one while you do your normal daily activities. This is usually used to diagnose what is causing palpitations/syncope (passing out).  Follow up as planned

## 2013-12-01 ENCOUNTER — Encounter: Payer: Self-pay | Admitting: *Deleted

## 2013-12-01 ENCOUNTER — Encounter (INDEPENDENT_AMBULATORY_CARE_PROVIDER_SITE_OTHER): Payer: Medicare Other

## 2013-12-01 DIAGNOSIS — R55 Syncope and collapse: Secondary | ICD-10-CM | POA: Diagnosis not present

## 2013-12-01 DIAGNOSIS — I4891 Unspecified atrial fibrillation: Secondary | ICD-10-CM

## 2013-12-01 NOTE — Progress Notes (Signed)
Patient ID: [name], female   DOB: Oct 03, 1925, 80 y.o.   MRN: [id number] E-Cardio 48 hour holter monitor applied to patient.

## 2013-12-08 DIAGNOSIS — N3941 Urge incontinence: Secondary | ICD-10-CM | POA: Diagnosis not present

## 2013-12-10 ENCOUNTER — Telehealth: Payer: Self-pay | Admitting: Interventional Cardiology

## 2013-12-10 DIAGNOSIS — H5200 Hypermetropia, unspecified eye: Secondary | ICD-10-CM | POA: Diagnosis not present

## 2013-12-10 DIAGNOSIS — H40059 Ocular hypertension, unspecified eye: Secondary | ICD-10-CM | POA: Diagnosis not present

## 2013-12-10 DIAGNOSIS — H40019 Open angle with borderline findings, low risk, unspecified eye: Secondary | ICD-10-CM | POA: Diagnosis not present

## 2013-12-10 DIAGNOSIS — H52229 Regular astigmatism, unspecified eye: Secondary | ICD-10-CM | POA: Diagnosis not present

## 2013-12-10 NOTE — Telephone Encounter (Signed)
New message     Want monitor results 

## 2013-12-14 NOTE — Telephone Encounter (Signed)
pt aware holter monitor report received today. i will call her back once reviewed by Dr.Smith.pt verbalized understanding and thanked  me for the call.

## 2013-12-16 ENCOUNTER — Encounter: Payer: Self-pay | Admitting: Interventional Cardiology

## 2013-12-23 ENCOUNTER — Telehealth: Payer: Self-pay | Admitting: Interventional Cardiology

## 2013-12-23 MED ORDER — ATENOLOL 50 MG PO TABS: 25.0000 mg | ORAL_TABLET | Freq: Every day | ORAL | Status: DC

## 2013-12-23 NOTE — Telephone Encounter (Signed)
called to give pt holter monitor results.lmtcb

## 2013-12-23 NOTE — Telephone Encounter (Signed)
controlled rate.pt sts that she has decreased Atenolol to 25mg  daily and has not had any reoccurrences of "that sinking feeling".adv pt I will fwd Dr.Smith the message and call back if he has any add instructions.pt agreeable with plan and verbalized understanding

## 2013-12-23 NOTE — Telephone Encounter (Signed)
New Message:  Pt is requesting a call back to hear her heart monitor results

## 2013-12-29 DIAGNOSIS — N3941 Urge incontinence: Secondary | ICD-10-CM | POA: Diagnosis not present

## 2013-12-31 DIAGNOSIS — N390 Urinary tract infection, site not specified: Secondary | ICD-10-CM | POA: Diagnosis not present

## 2013-12-31 DIAGNOSIS — Z0000 Encounter for general adult medical examination without abnormal findings: Secondary | ICD-10-CM | POA: Diagnosis not present

## 2013-12-31 DIAGNOSIS — Z1331 Encounter for screening for depression: Secondary | ICD-10-CM | POA: Diagnosis not present

## 2013-12-31 DIAGNOSIS — I5033 Acute on chronic diastolic (congestive) heart failure: Secondary | ICD-10-CM | POA: Diagnosis not present

## 2013-12-31 DIAGNOSIS — I4891 Unspecified atrial fibrillation: Secondary | ICD-10-CM | POA: Diagnosis not present

## 2013-12-31 DIAGNOSIS — Z23 Encounter for immunization: Secondary | ICD-10-CM | POA: Diagnosis not present

## 2013-12-31 DIAGNOSIS — I059 Rheumatic mitral valve disease, unspecified: Secondary | ICD-10-CM | POA: Diagnosis not present

## 2013-12-31 DIAGNOSIS — G609 Hereditary and idiopathic neuropathy, unspecified: Secondary | ICD-10-CM | POA: Diagnosis not present

## 2013-12-31 DIAGNOSIS — M899 Disorder of bone, unspecified: Secondary | ICD-10-CM | POA: Diagnosis not present

## 2013-12-31 DIAGNOSIS — M949 Disorder of cartilage, unspecified: Secondary | ICD-10-CM | POA: Diagnosis not present

## 2013-12-31 DIAGNOSIS — I10 Essential (primary) hypertension: Secondary | ICD-10-CM | POA: Diagnosis not present

## 2014-01-19 ENCOUNTER — Ambulatory Visit (INDEPENDENT_AMBULATORY_CARE_PROVIDER_SITE_OTHER): Payer: Medicare Other | Admitting: Interventional Cardiology

## 2014-01-19 ENCOUNTER — Encounter: Payer: Self-pay | Admitting: Interventional Cardiology

## 2014-01-19 VITALS — BP 102/60 | HR 80 | Ht 63.5 in | Wt 135.0 lb

## 2014-01-19 DIAGNOSIS — I4891 Unspecified atrial fibrillation: Secondary | ICD-10-CM

## 2014-01-19 DIAGNOSIS — Z5181 Encounter for therapeutic drug level monitoring: Secondary | ICD-10-CM | POA: Diagnosis not present

## 2014-01-19 DIAGNOSIS — Z9889 Other specified postprocedural states: Secondary | ICD-10-CM | POA: Diagnosis not present

## 2014-01-19 DIAGNOSIS — I482 Chronic atrial fibrillation, unspecified: Secondary | ICD-10-CM

## 2014-01-19 DIAGNOSIS — Z7901 Long term (current) use of anticoagulants: Secondary | ICD-10-CM

## 2014-01-19 DIAGNOSIS — N3941 Urge incontinence: Secondary | ICD-10-CM | POA: Diagnosis not present

## 2014-01-19 MED ORDER — ATENOLOL 25 MG PO TABS: ORAL_TABLET | ORAL | Status: DC

## 2014-01-19 NOTE — Patient Instructions (Signed)
Your physician recommends that you continue on your current medications as directed. Please refer to the Current Medication list given to you today.  Your physician wants you to follow-up in: 9 months with Dr.Smith You will receive a reminder letter in the mail two months in advance. If you don't receive a letter, please call our office to schedule the follow-up appointment.  

## 2014-01-19 NOTE — Progress Notes (Signed)
Patient ID: [name], female   DOB: [birth year], 80 y.o.   MRN: [id number]    [name], [address] [place], [place]  [address] Phone: [phone] Fax:  [phone]  Date:  01/19/2014   ID:  [name], DOB Jan 04, 1926, MRN [id number]  PCP:  Horton Finer, MD   ASSESSMENT:  1. Chronic atrial fibrillation with rate control 2. Transient dizziness has completely resolved after the patient independently decreased atenolol to 25 mg daily 3. Anticoagulation without complications  PLAN:  1. continue atenolol 25 mg per day and new prescription for the 25 mg tablets 2. Followup in 9 months   SUBJECTIVE: [name] is a 80 y.o. female who is doing much better after she took matters and her own hands. She was on metoprolol 50 mg per day and stated that she was having episodes of weakness and near fainting. A 24-hour Holter monitor demonstrated average heart rate of 60 beats per minute with a range of 39-125 beats per minute. After she completed the monitor, which did not record any periods of symptomatic arrhythmia, she decreased atenolol to 25 mg per day. Over the past 2 weeks on the lower dose of atenolol she has felt great without any recurrences of dizziness.   Wt Readings from Last 3 Encounters:  01/19/14 135 lb (61.236 kg)  11/27/13 134 lb (60.782 kg)  06/23/13 135 lb 6.4 oz (61.417 kg)     Past Medical History  Diagnosis Date  . Anxiety   . Atrial fibrillation     s/p MAZE 2010  . GERD (gastroesophageal reflux disease)   . Complication of anesthesia     Nausea from versed  . Atypical atrial flutter   . History of mitral valve repair     Mitral valve prolapse, surgery in 2010  . Rheumatic fever     age 68  . Cancer     1998 Right breast cancer with lumpectomy and radiation therapy  . Arthritis     Some in neck and back, but "can't complain"    Current Outpatient Prescriptions  Medication Sig Dispense Refill  . apixaban (ELIQUIS) 2.5 MG TABS tablet  Take 2.5 mg by mouth 2 (two) times daily.      . Ascorbic Acid (VITAMIN C) 1000 MG tablet Take 1,000 mg by mouth daily.      [name] atenolol (TENORMIN) 50 MG tablet Take 0.5 tablets (25 mg total) by mouth daily.      [name] CHIA SEED PO Take 1 tablet by mouth.      . cholecalciferol (VITAMIN D) 1000 UNITS tablet Take 1,000 Units by mouth daily.      . Coenzyme Q10 150 MG CAPS Take by mouth 2 times daily at 12 noon and 4 pm.      . Cyanocobalamin (VITAMIN B-12) 1000 MCG SUBL Place 1,000 mcg under the tongue.      [name] Deoxyribose (DEOXY-D-RIBOSE) POWD by Does not apply route. Take once daily      . LORazepam (ATIVAN) 0.5 MG tablet Take 0.5 mg by mouth at bedtime as needed. For sleep      . Magnesium 200 MG TABS Take two tablet in the morning and two tablet in the evening      . OVER THE COUNTER MEDICATION Take 1 each by mouth daily. Hemp heart seeds      . OVER THE COUNTER MEDICATION Take 1 each by mouth daily. Greens plus supplement      .  polyethylene glycol (MIRALAX / GLYCOLAX) packet Take 17 g by mouth 2 (two) times daily.  14 each    . polyvinyl alcohol (LIQUIFILM TEARS) 1.4 % ophthalmic solution Place 1 drop into both eyes as needed. For dry eyes      . Probiotic Product (PROBIOTIC DAILY PO) Take by mouth daily. 10 Billi CFU       No current facility-administered medications for this visit.    Allergies:    Allergies  Allergen Reactions  . Amiodarone     sick  . Amoxicillin Nausea And Vomiting  . Amoxicillin-Pot Clavulanate Nausea And Vomiting  . Aspirin     Unknown... 325 mg and more.  . Ciprofloxacin     Flu symptoms  . Codeine Nausea And Vomiting  . Diuretic [Buchu-Cornsilk-Ch Grass-Hydran]     Pharmacy record  . Iohexol Hives     Code: HIVES, Desc: **11/22/08 **NO REACTION W/ OMNI 300// PT S/P 3 CT SCANS W/ IV CM AND NO HIVES/MMS**PT STATED HIVES 16 YRS AGO. POSSIBLE IV DYE W/ CT/XRAY. MEDICATED W/ 50MG  OF BENEDRYL PO PRIOR TO SCAN, Onset Date: [id number]   . Lisinopril     cough  .  Midazolam Hcl Nausea And Vomiting  . Quinolones Other (See Comments)    Flu like symptoms  . Sotalol     Unknown   . Sulfa Antibiotics Other (See Comments)    Flu like symptoms 'serum sickness'  . Thioxanthenes Other (See Comments)    Flu like symptoms 'serum sickness'  . Valium Nausea And Vomiting  . Vancomycin     unknown  . Versed [Midazolam] Nausea And Vomiting    Social History:  The patient  reports that she has never smoked. She does not have any smokeless tobacco history on file. She reports that she does not drink alcohol or use illicit drugs.   ROS:  Please see the history of present illness.   No blood in urine or stool. No syncope. Dizziness has resolved. No chest pain. No orthopnea, PND, palpitations.   All other systems reviewed and negative.   OBJECTIVE: VS:  BP 102/60  Pulse 80  Ht 5' 3.5" (1.613 m)  Wt 135 lb (61.236 kg)  BMI 23.54 kg/m2 Well nourished, well developed, in no acute distress, younger than stated age 27: normal Neck: JVD flat. Carotid bruit absent  Cardiac:  normal S1, S2; IIRR; no murmur Lungs:  clear to auscultation bilaterally, no wheezing, rhonchi or rales Abd: soft, nontender, no hepatomegaly Ext: Edema absent. Pulses 2+ Skin: warm and dry Neuro:  CNs 2-12 intact, no focal abnormalities noted  EKG:  Not repeated       Signed, Illene Labrador III, MD 01/19/2014 9:52 AM

## 2014-01-20 ENCOUNTER — Ambulatory Visit (INDEPENDENT_AMBULATORY_CARE_PROVIDER_SITE_OTHER): Payer: Medicare Other | Admitting: Sports Medicine

## 2014-01-20 ENCOUNTER — Encounter: Payer: Self-pay | Admitting: Sports Medicine

## 2014-01-20 ENCOUNTER — Ambulatory Visit: Payer: Medicare Other | Admitting: Interventional Cardiology

## 2014-01-20 VITALS — BP 125/89 | HR 62 | Ht 63.0 in | Wt 130.0 lb

## 2014-01-20 DIAGNOSIS — M25569 Pain in unspecified knee: Secondary | ICD-10-CM | POA: Diagnosis not present

## 2014-01-20 DIAGNOSIS — M25562 Pain in left knee: Secondary | ICD-10-CM

## 2014-01-20 NOTE — Assessment & Plan Note (Signed)
I reassured her that this does not seem like a serious injury to the knee  She will use an Ace wrap for some light compression when standing or walking over the next one to 2 weeks  Use a cane if needed  Icing to the superior patella  Return if needed

## 2014-01-20 NOTE — Patient Instructions (Signed)
Use a wrap for standing or walking very long Keep on for about an hour  You have mild swelling and small spur from bumping your knee cap  2 exercises Lift hip sitting in a chair 10 times  Straight leg lift while sitting  Do at least once daily  It is OK to do the water exercises  See me as needed

## 2014-01-20 NOTE — Progress Notes (Signed)
Patient ID: DELAYNA SPARLIN, female   DOB: [birth year], 80 y.o.   MRN: [id number]  Fraser Din with Lt knee pain  [place] last year and broke some ribs but she is usually stable She lives in  friend's home and was sent by [place]  Hx of atrial fib and gradually cut down on walking Now still does water exercise 2x wk  One fall this year but more of a slip  Left knee painful since July 1 She did hit knee on table Wanted to give out on July 4 Now turning over is painful Pain is on the anterior knee and the knee cap itself  Physical exam Pleasant elderly female who is alert oriented BP 125/89  Pulse 62  Ht 5\' 3"  (1.6 m)  Wt 130 lb (58.968 kg)  BMI 23.03 kg/m2 Note that her pulse is irregularly irregular  Knee: Normal to inspection with no erythema or effusion or obvious bony abnormalities. Palpation normal with no warmth or joint line tenderness or patellar tenderness or condyle tenderness. ROM normal in flexion and extension and lower leg rotation. Ligaments with solid consistent endpoints including ACL, PCL, LCL, MCL. Negative Mcmurray's and provocative meniscal tests. Mildly painful patellar compression more on the superior and anterior surface Patellar and quadriceps tendons unremarkable. Hamstring and quadriceps strength is normal.  Ultrasound examination No effusion Medial and lateral meniscus looks Good Patellar tendon normal Quadriceps tendon there is a small spur coming off the superior part of the patella and it has hypoechoic change around it

## 2014-02-24 ENCOUNTER — Ambulatory Visit: Payer: Medicare Other | Admitting: Sports Medicine

## 2014-02-26 DIAGNOSIS — N3941 Urge incontinence: Secondary | ICD-10-CM | POA: Diagnosis not present

## 2014-03-12 DIAGNOSIS — D239 Other benign neoplasm of skin, unspecified: Secondary | ICD-10-CM | POA: Diagnosis not present

## 2014-03-12 DIAGNOSIS — D1801 Hemangioma of skin and subcutaneous tissue: Secondary | ICD-10-CM | POA: Diagnosis not present

## 2014-03-12 DIAGNOSIS — L821 Other seborrheic keratosis: Secondary | ICD-10-CM | POA: Diagnosis not present

## 2014-03-12 DIAGNOSIS — L819 Disorder of pigmentation, unspecified: Secondary | ICD-10-CM | POA: Diagnosis not present

## 2014-03-19 ENCOUNTER — Emergency Department (HOSPITAL_COMMUNITY): Payer: Medicare Other

## 2014-03-19 ENCOUNTER — Encounter (HOSPITAL_COMMUNITY): Payer: Self-pay | Admitting: Emergency Medicine

## 2014-03-19 ENCOUNTER — Inpatient Hospital Stay (HOSPITAL_COMMUNITY)
Admission: EM | Admit: 2014-03-19 | Discharge: 2014-03-21 | DRG: 392 | Disposition: A | Discharge status: Home / Self Care | Payer: Medicare Other | Attending: Internal Medicine | Admitting: Internal Medicine

## 2014-03-19 DIAGNOSIS — Z79899 Other long term (current) drug therapy: Secondary | ICD-10-CM

## 2014-03-19 DIAGNOSIS — K5289 Other specified noninfective gastroenteritis and colitis: Secondary | ICD-10-CM | POA: Diagnosis not present

## 2014-03-19 DIAGNOSIS — F411 Generalized anxiety disorder: Secondary | ICD-10-CM | POA: Diagnosis present

## 2014-03-19 DIAGNOSIS — D649 Anemia, unspecified: Secondary | ICD-10-CM | POA: Diagnosis present

## 2014-03-19 DIAGNOSIS — M129 Arthropathy, unspecified: Secondary | ICD-10-CM | POA: Diagnosis present

## 2014-03-19 DIAGNOSIS — E871 Hypo-osmolality and hyponatremia: Secondary | ICD-10-CM | POA: Diagnosis present

## 2014-03-19 DIAGNOSIS — Z5181 Encounter for therapeutic drug level monitoring: Secondary | ICD-10-CM

## 2014-03-19 DIAGNOSIS — I482 Chronic atrial fibrillation, unspecified: Secondary | ICD-10-CM

## 2014-03-19 DIAGNOSIS — D696 Thrombocytopenia, unspecified: Secondary | ICD-10-CM | POA: Diagnosis present

## 2014-03-19 DIAGNOSIS — K219 Gastro-esophageal reflux disease without esophagitis: Secondary | ICD-10-CM | POA: Diagnosis present

## 2014-03-19 DIAGNOSIS — Z9889 Other specified postprocedural states: Secondary | ICD-10-CM

## 2014-03-19 DIAGNOSIS — D72829 Elevated white blood cell count, unspecified: Secondary | ICD-10-CM | POA: Diagnosis present

## 2014-03-19 DIAGNOSIS — K5900 Constipation, unspecified: Secondary | ICD-10-CM | POA: Diagnosis present

## 2014-03-19 DIAGNOSIS — Z923 Personal history of irradiation: Secondary | ICD-10-CM

## 2014-03-19 DIAGNOSIS — I4891 Unspecified atrial fibrillation: Secondary | ICD-10-CM | POA: Diagnosis present

## 2014-03-19 DIAGNOSIS — Z853 Personal history of malignant neoplasm of breast: Secondary | ICD-10-CM | POA: Diagnosis not present

## 2014-03-19 DIAGNOSIS — Z7901 Long term (current) use of anticoagulants: Secondary | ICD-10-CM

## 2014-03-19 DIAGNOSIS — Z9849 Cataract extraction status, unspecified eye: Secondary | ICD-10-CM | POA: Diagnosis not present

## 2014-03-19 DIAGNOSIS — Z8679 Personal history of other diseases of the circulatory system: Secondary | ICD-10-CM

## 2014-03-19 DIAGNOSIS — R109 Unspecified abdominal pain: Secondary | ICD-10-CM | POA: Diagnosis present

## 2014-03-19 DIAGNOSIS — M25562 Pain in left knee: Secondary | ICD-10-CM

## 2014-03-19 DIAGNOSIS — R0602 Shortness of breath: Secondary | ICD-10-CM

## 2014-03-19 DIAGNOSIS — I4819 Other persistent atrial fibrillation: Secondary | ICD-10-CM

## 2014-03-19 DIAGNOSIS — K529 Noninfective gastroenteritis and colitis, unspecified: Secondary | ICD-10-CM | POA: Diagnosis present

## 2014-03-19 DIAGNOSIS — C50919 Malignant neoplasm of unspecified site of unspecified female breast: Secondary | ICD-10-CM | POA: Diagnosis not present

## 2014-03-19 HISTORY — DX: Anemia, unspecified: D64.9

## 2014-03-19 LAB — CBC WITH DIFFERENTIAL/PLATELET
Basophils Absolute: 0 10*3/uL (ref 0.0–0.1)
Basophils Relative: 0 % (ref 0–1)
Eosinophils Absolute: 0 10*3/uL (ref 0.0–0.7)
Eosinophils Relative: 0 % (ref 0–5)
HCT: 40.2 % (ref 36.0–46.0)
Hemoglobin: 13.7 g/dL (ref 12.0–15.0)
Lymphocytes Relative: 12 % (ref 12–46)
Lymphs Abs: 1.3 10*3/uL (ref 0.7–4.0)
MCH: 34 pg (ref 26.0–34.0)
MCHC: 34.1 g/dL (ref 30.0–36.0)
MCV: 99.8 fL (ref 78.0–100.0)
Monocytes Absolute: 0.8 10*3/uL (ref 0.1–1.0)
Monocytes Relative: 8 % (ref 3–12)
Neutro Abs: 8.7 10*3/uL — ABNORMAL HIGH (ref 1.7–7.7)
Neutrophils Relative %: 80 % — ABNORMAL HIGH (ref 43–77)
Platelets: 168 10*3/uL (ref 150–400)
RBC: 4.03 MIL/uL (ref 3.87–5.11)
RDW: 12.6 % (ref 11.5–15.5)
WBC: 10.9 10*3/uL — ABNORMAL HIGH (ref 4.0–10.5)

## 2014-03-19 LAB — URINALYSIS, ROUTINE W REFLEX MICROSCOPIC
Bilirubin Urine: NEGATIVE
Glucose, UA: NEGATIVE mg/dL
Hgb urine dipstick: NEGATIVE
Ketones, ur: 15 mg/dL — AB
Leukocytes, UA: NEGATIVE
Nitrite: NEGATIVE
Protein, ur: NEGATIVE mg/dL
Specific Gravity, Urine: 1.014 (ref 1.005–1.030)
Urobilinogen, UA: 0.2 mg/dL (ref 0.0–1.0)
pH: 6 (ref 5.0–8.0)

## 2014-03-19 LAB — COMPREHENSIVE METABOLIC PANEL
ALT: 28 U/L (ref 0–35)
AST: 36 U/L (ref 0–37)
Albumin: 4.1 g/dL (ref 3.5–5.2)
Alkaline Phosphatase: 59 U/L (ref 39–117)
Anion gap: 15 (ref 5–15)
BUN: 13 mg/dL (ref 6–23)
CO2: 26 mEq/L (ref 19–32)
Calcium: 9.7 mg/dL (ref 8.4–10.5)
Chloride: 89 mEq/L — ABNORMAL LOW (ref 96–112)
Creatinine, Ser: 0.54 mg/dL (ref 0.50–1.10)
GFR calc Af Amer: >90 mL/min (ref >90)
GFR calc non Af Amer: 83 mL/min — ABNORMAL LOW (ref >90)
Glucose, Bld: 99 mg/dL (ref 70–99)
Potassium: 4.9 mEq/L (ref 3.7–5.3)
Sodium: 130 mEq/L — ABNORMAL LOW (ref 137–147)
Total Bilirubin: 1.4 mg/dL — ABNORMAL HIGH (ref 0.3–1.2)
Total Protein: 8.6 g/dL — ABNORMAL HIGH (ref 6.0–8.3)

## 2014-03-19 MED ORDER — SODIUM CHLORIDE 0.9 % IV BOLUS (SEPSIS)
500.0000 mL | Freq: Once | INTRAVENOUS | Status: AC
  Administered 2014-03-19: 500 mL via INTRAVENOUS

## 2014-03-19 MED ORDER — ACETAMINOPHEN 650 MG RE SUPP: 650.0000 mg | Freq: Four times a day (QID) | RECTAL | Status: DC | PRN

## 2014-03-19 MED ORDER — METRONIDAZOLE 500 MG PO TABS
500.0000 mg | ORAL_TABLET | Freq: Once | ORAL | Status: AC
  Administered 2014-03-19: 500 mg via ORAL
  Filled 2014-03-19: qty 1

## 2014-03-19 MED ORDER — ONDANSETRON HCL 4 MG/2ML IJ SOLN
4.0000 mg | Freq: Four times a day (QID) | INTRAMUSCULAR | Status: DC | PRN
  Administered 2014-03-20 – 2014-03-21 (×3): 4 mg via INTRAVENOUS
  Filled 2014-03-19 (×3): qty 2

## 2014-03-19 MED ORDER — SODIUM CHLORIDE 0.9 % IV SOLN
INTRAVENOUS | Status: DC
  Administered 2014-03-19: 23:00:00 via INTRAVENOUS

## 2014-03-19 MED ORDER — IOHEXOL 300 MG/ML  SOLN
80.0000 mL | Freq: Once | INTRAMUSCULAR | Status: AC | PRN
  Administered 2014-03-19: 80 mL via INTRAVENOUS

## 2014-03-19 MED ORDER — DIPHENHYDRAMINE HCL 25 MG PO CAPS
50.0000 mg | ORAL_CAPSULE | Freq: Once | ORAL | Status: AC
  Administered 2014-03-19: 50 mg via ORAL
  Filled 2014-03-19: qty 2

## 2014-03-19 MED ORDER — ACETAMINOPHEN 325 MG PO TABS
650.0000 mg | ORAL_TABLET | Freq: Four times a day (QID) | ORAL | Status: DC | PRN
  Administered 2014-03-20 (×2): 325 mg via ORAL
  Administered 2014-03-20: 650 mg via ORAL
  Administered 2014-03-21: 325 mg via ORAL
  Filled 2014-03-19 (×4): qty 2

## 2014-03-19 MED ORDER — APIXABAN 2.5 MG PO TABS
2.5000 mg | ORAL_TABLET | Freq: Two times a day (BID) | ORAL | Status: DC
  Administered 2014-03-19 – 2014-03-21 (×4): 2.5 mg via ORAL
  Filled 2014-03-19 (×5): qty 1

## 2014-03-19 MED ORDER — LORAZEPAM 0.5 MG PO TABS
0.5000 mg | ORAL_TABLET | Freq: Every evening | ORAL | Status: DC | PRN
  Administered 2014-03-20: 0.5 mg via ORAL
  Filled 2014-03-19: qty 1

## 2014-03-19 MED ORDER — ONDANSETRON HCL 4 MG PO TABS
4.0000 mg | ORAL_TABLET | Freq: Four times a day (QID) | ORAL | Status: DC | PRN
  Administered 2014-03-20: 4 mg via ORAL
  Filled 2014-03-19: qty 1

## 2014-03-19 MED ORDER — ATENOLOL 25 MG PO TABS
25.0000 mg | ORAL_TABLET | Freq: Every day | ORAL | Status: DC
  Administered 2014-03-19 – 2014-03-21 (×3): 25 mg via ORAL
  Filled 2014-03-19 (×3): qty 1

## 2014-03-19 MED ORDER — METRONIDAZOLE IN NACL 5-0.79 MG/ML-% IV SOLN
500.0000 mg | Freq: Three times a day (TID) | INTRAVENOUS | Status: DC
  Administered 2014-03-19 – 2014-03-21 (×5): 500 mg via INTRAVENOUS
  Filled 2014-03-19 (×7): qty 100

## 2014-03-19 NOTE — H&P (Signed)
PCP:   Henrine Screws, MD   Chief Complaint:  Abdominal pain  HPI: 80 yr old female who  has a past medical history of Anxiety; Atrial fibrillation; GERD (gastroesophageal reflux disease); Complication of anesthesia; Atypical atrial flutter; History of mitral valve repair; Rheumatic fever; Cancer; and Arthritis. Came to the ED with chief complaint of abdominal pain and constipation for past 3 days. Patient recently was diagnosed with UTI and was prescribed 5 days of Keflex, she completed the course on Wednesday. Since then patient has not had bowel movement, and yesterday developed abdominal pain. She denies nausea or vomiting. No fever. Patient says that she is able to pass gas, but no stool. In the ED CT abdomen pelvis was done, which showed mild sigmoid colitis. Patient has been afebrile, with normal WBCs. Has been given one dose of Flagyl. Stool for C. difficile ordered. Abdominal pain is mild in intensity, located in both right and left lower quadrants. She denies dysuria.   Allergies:   Allergies  Allergen Reactions  . Amiodarone     sick  . Amoxicillin Nausea And Vomiting  . Amoxicillin-Pot Clavulanate Nausea And Vomiting  . Aspirin     Unknown... 325 mg and more.  . Ciprofloxacin     Flu symptoms  . Codeine Nausea And Vomiting  . Diuretic [Buchu-Cornsilk-Ch Grass-Hydran]     Pharmacy record  . Iohexol Hives     Code: HIVES, Desc: **11/22/08 **NO REACTION W/ OMNI 300// PT S/P 3 CT SCANS W/ IV CM AND NO HIVES/MMS**PT STATED HIVES 16 YRS AGO. POSSIBLE IV DYE W/ CT/XRAY. MEDICATED W/ 50MG  OF BENEDRYL PO PRIOR TO SCAN, Onset Date: [id number]   . Lisinopril     cough  . Midazolam Hcl Nausea And Vomiting  . Quinolones Other (See Comments)    Flu like symptoms  . Sotalol     Unknown   . Sulfa Antibiotics Other (See Comments)    Flu like symptoms 'serum sickness'  . Thioxanthenes Other (See Comments)    Flu like symptoms 'serum sickness'  . Valium Nausea And Vomiting    . Vancomycin     unknown  . Versed [Midazolam] Nausea And Vomiting      Past Medical History  Diagnosis Date  . Anxiety   . Atrial fibrillation     s/p MAZE 2010  . GERD (gastroesophageal reflux disease)   . Complication of anesthesia     Nausea from versed  . Atypical atrial flutter   . History of mitral valve repair     Mitral valve prolapse, surgery in 2010  . Rheumatic fever     age 5  . Cancer     1998 Right breast cancer with lumpectomy and radiation therapy  . Arthritis     Some in neck and back, but "can't complain"    Past Surgical History  Procedure Laterality Date  . Mitral valve repair  June 2010  . Cataract extraction    . Kidney cyst removal    . Breast surgery      1998 Lumpectomy in right breast  . Back surgery      Synovial cyst on spine, removed laproscopically in 2000?  [name] Eye surgery      Cataract Surgery, both eyes  . Appendectomy    . Tonsillectomy    . Cesarean section      Three C-Sections  . Tee without cardioversion  04/29/2012    Procedure: TRANSESOPHAGEAL ECHOCARDIOGRAM (TEE);  Surgeon: [name], MD;  Location: [place] [facility];  Service: Cardiovascular;  Laterality: N/A;  . Cardioversion  04/29/2012    Procedure: CARDIOVERSION;  Surgeon: [name], MD;  Location: [place];  Service: Cardiovascular;  Laterality: N/A;  . Cardioversion  06/19/2012    Procedure: CARDIOVERSION;  Surgeon: Sinclair Grooms, MD;  Location: [facility] ENDOSCOPY;  Service: Cardiovascular;  Laterality: N/A;  h/p from 11/8 in file drawer/dl    Prior to Admission medications   Medication Sig Start Date End Date Taking? Authorizing Provider  apixaban (ELIQUIS) 2.5 MG TABS tablet Take 2.5 mg by mouth 2 (two) times daily.   Yes Historical Provider, MD  Ascorbic Acid (VITAMIN C) 1000 MG tablet Take 1,000 mg by mouth daily.   Yes Historical Provider, MD  atenolol (TENORMIN) 25 MG tablet 1 tablet daily 01/19/14  Yes Belva Crome III, MD  cephALEXin (KEFLEX) 250 MG  capsule Take 250 mg by mouth daily. For 5 days   Yes Historical Provider, MD  CHIA SEED PO Take 5 mLs by mouth daily.    Yes Historical Provider, MD  cholecalciferol (VITAMIN D) 1000 UNITS tablet Take 1,000 Units by mouth daily.   Yes Historical Provider, MD  Coenzyme Q10 150 MG CAPS Take by mouth 2 times daily at 12 noon and 4 pm.   Yes Historical Provider, MD  Cyanocobalamin (VITAMIN B-12) 1000 MCG SUBL Place 1,000 mcg under the tongue.   Yes Historical Provider, MD  docusate sodium (COLACE) 100 MG capsule Take 100 mg by mouth daily as needed for mild constipation.   Yes Historical Provider, MD  LORazepam (ATIVAN) 0.5 MG tablet Take 0.5 mg by mouth at bedtime as needed. For sleep   Yes Historical Provider, MD  OVER THE COUNTER MEDICATION Take 1 each by mouth daily. Hemp heart seeds   Yes Historical Provider, MD  OVER THE COUNTER MEDICATION Take 1 each by mouth daily. Greens plus supplement   Yes Historical Provider, MD  polyethylene glycol (MIRALAX / GLYCOLAX) packet Take 17 g by mouth 2 (two) times daily. 05/01/12  Yes Lezlie Octave Black, NP  polyvinyl alcohol (LIQUIFILM TEARS) 1.4 % ophthalmic solution Place 1 drop into both eyes as needed. For dry eyes   Yes Historical Provider, MD  Probiotic Product (PROBIOTIC DAILY PO) Take by mouth daily. 10 Billi CFU   Yes Historical Provider, MD  Sodium Phosphates (ENEMA) 7-19 GM/118ML ENEM Place 1 enema rectally once.   Yes Historical Provider, MD    Social History:  reports that she has never smoked. She has never used smokeless tobacco. She reports that she does not drink alcohol or use illicit drugs.  Family History  Problem Relation Age of Onset  . Other Sister      All the positives are listed in BOLD  Review of Systems:  HEENT: Headache, blurred vision, runny nose, sore throat Neck: Hypothyroidism, hyperthyroidism,,lymphadenopathy Chest : Shortness of breath, history of COPD, Asthma Heart : Chest pain, history of coronary arterey  disease GI:  Nausea, vomiting, diarrhea, constipation, GERD GU: Dysuria, urgency, frequency of urination, hematuria Neuro: Stroke, seizures, syncope Psych: Depression, anxiety, hallucinations   Physical Exam: Blood pressure 122/72, pulse 80, temperature 98.7 F (37.1 C), temperature source Oral, resp. rate 16, height 5\' 3"  (1.6 m), weight 58.968 kg (130 lb), SpO2 98.00%. Constitutional:   Patient is a well-developed and well-nourished female in no acute distress and cooperative with exam. Head: Normocephalic and atraumatic Mouth: Mucus membranes moist Eyes: PERRL, EOMI, conjunctivae normal Neck: Supple, No Thyromegaly Cardiovascular:  RRR, S1 normal, S2 normal Pulmonary/Chest: CTAB, no wheezes, rales, or rhonchi Abdominal: Soft.  tender to palpation in the right and left lower quadrants, no rigidity no guarding, non-distended, bowel sounds are normal, no masses, organomegaly, or guarding present.  Neurological: A&O x3, Strenght is normal and symmetric bilaterally, cranial nerve II-XII are grossly intact, no focal motor deficit, sensory intact to light touch bilaterally.  Extremities : No Cyanosis, Clubbing or Edema  Labs on Admission:  Basic Metabolic Panel:  Recent Labs Lab 03/19/14 0833  NA 130*  K 4.9  CL 89*  CO2 26  GLUCOSE 99  BUN 13  CREATININE 0.54  CALCIUM 9.7   Liver Function Tests:  Recent Labs Lab 03/19/14 0833  AST 36  ALT 28  ALKPHOS 59  BILITOT 1.4*  PROT 8.6*  ALBUMIN 4.1   No results found for this basename: LIPASE, AMYLASE,  in the last 168 hours No results found for this basename: AMMONIA,  in the last 168 hours CBC:  Recent Labs Lab 03/19/14 0833  WBC 10.9*  NEUTROABS 8.7*  HGB 13.7  HCT 40.2  MCV 99.8  PLT 168    Radiological Exams on Admission: Ct Abdomen Pelvis W Contrast  03/19/2014   CLINICAL DATA:  Breast cancer, low pelvic pain  EXAM: CT ABDOMEN AND PELVIS WITH CONTRAST  TECHNIQUE: Multidetector CT imaging of the abdomen and  pelvis was performed using the standard protocol following bolus administration of intravenous contrast.  CONTRAST:  65mL OMNIPAQUE IOHEXOL 300 MG/ML  SOLN  COMPARISON:  05/08/2010  FINDINGS: The lung bases are clear. There is cardiomegaly.  The liver demonstrates no focal abnormality. There is no intrahepatic or extrahepatic biliary ductal dilatation. The gallbladder is normal. The spleen demonstrates no focal abnormality. The kidneys, adrenal glands and pancreas are normal. The bladder is unremarkable.  The stomach, duodenum, small intestine, and large intestine demonstrate no contrast extravasation or dilatation. There is sigmoid colon bowel wall thickening and pericolonic inflammatory change concerning for mild colitis. There is no focal fluid collection to suggest an abscess. There is no pneumoperitoneum, pneumatosis, or portal venous gas. There is no abdominal or pelvic free fluid. There is no lymphadenopathy.  The abdominal aorta is normal in caliber with atherosclerosis.  There are no lytic or sclerotic osseous lesions. There is grade 1 anterolisthesis of L4 on L5 secondary to facet arthropathy. There is degenerative disc disease at L4-5. There is minimal retrolisthesis of L3-L4. There is minimal retrolisthesis of L2 on L3.  IMPRESSION: There is sigmoid colon bowel wall thickening and pericolonic inflammatory change concerning for mild colitis. There is no focal fluid collection to suggest an abscess.   Electronically Signed   By: Kathreen Devoid   On: 03/19/2014 12:03   Dg Abd Acute W/chest  03/19/2014   CLINICAL DATA:  Abdominal pain  EXAM: ACUTE ABDOMEN SERIES (ABDOMEN 2 VIEW & CHEST 1 VIEW)  COMPARISON:  None.  FINDINGS: Cardiac shadow is enlarged. Postsurgical changes are noted consistent with valve repair. The lungs are well aerated bilaterally. Mild interstitial changes are seen. No focal confluent infiltrate is noted. Calcified granuloma is noted in the right lung base. The abdomen shows a  nonobstructive bowel gas pattern. Fecal material is noted within the colon. No acute bony abnormality is seen. No free air is noted.  IMPRESSION: No acute abnormality noted.   Electronically Signed   By: Inez Catalina M.D.   On: 03/19/2014 08:57       Assessment/Plan Active Problems:   Colitis  Colitis CT abdomen pelvis shows sigmoid colon bowel wall thickening and pericolonic inflammatory changes concerning for mild colitis. Will start the patient on IV Flagyl 500 mg every 8 hours. She has multiple allergies to antibiotics, so we'll avoid any the antibiotics at this time. Patient will be given clear liquid diet, IV fluids, Zofran for nausea vomiting. Morphine when necessary for pain.  History of atrial fibrillation Heart rate is well controlled, we'll continue patient on anticoagulation with Eliquis.   Hyponatremia Sodium is 130, will check serum osmolality and urine sodium Check BMP in a.m.  Code status:Patient  Family discussion: Admission, patients condition and plan of care including tests being ordered have been discussed with the patient and *her son at bedside  who indicate understanding and agree with the plan and Code Status.   Time Spent on Admission: 60 minutes  [facility] Pager: [phone] 03/19/2014, 2:20 PM  If 7PM-7AM, please contact night-coverage  [email or web address]  Password [email or web address]

## 2014-03-19 NOTE — ED Notes (Signed)
Berneda Rose [phone](cell)

## 2014-03-19 NOTE — ED Provider Notes (Signed)
CSN: [id number]     Arrival date & time 03/19/14  0413 History   First MD Initiated Contact with Patient 03/19/14 [phone]     Chief Complaint  Patient presents with  . Abdominal Pain     (Consider location/radiation/quality/duration/timing/severity/associated sxs/prior Treatment) Patient is a 80 y.o. female presenting with constipation.  Constipation Severity:  Moderate Time since last bowel movement:  3 days Timing:  Constant Progression:  Worsening Chronicity:  Recurrent Stool description:  None produced Unusual stool frequency:  Daily Relieved by:  Nothing Ineffective treatments: MiraLax, fleets enema. Associated symptoms: abdominal pain (cramping), flatus and nausea   Associated symptoms: no fever and no vomiting     Past Medical History  Diagnosis Date  . Anxiety   . Atrial fibrillation     s/p MAZE 2010  . GERD (gastroesophageal reflux disease)   . Complication of anesthesia     Nausea from versed  . Atypical atrial flutter   . History of mitral valve repair     Mitral valve prolapse, surgery in 2010  . Rheumatic fever     age 5  . Cancer     1998 Right breast cancer with lumpectomy and radiation therapy  . Arthritis     Some in neck and back, but "can't complain"   Past Surgical History  Procedure Laterality Date  . Mitral valve repair  June 2010  . Cataract extraction    . Kidney cyst removal    . Breast surgery      1998 Lumpectomy in right breast  . Back surgery      Synovial cyst on spine, removed laproscopically in 2000?  [name] Eye surgery      Cataract Surgery, both eyes  . Appendectomy    . Tonsillectomy    . Cesarean section      Three C-Sections  . Tee without cardioversion  04/29/2012    Procedure: TRANSESOPHAGEAL ECHOCARDIOGRAM (TEE);  Surgeon: [name], MD;  Location: [facility] ENDOSCOPY;  Service: Cardiovascular;  Laterality: N/A;  . Cardioversion  04/29/2012    Procedure: CARDIOVERSION;  Surgeon: [name], MD;  Location: [facility] ENDOSCOPY;   Service: Cardiovascular;  Laterality: N/A;  . Cardioversion  06/19/2012    Procedure: CARDIOVERSION;  Surgeon: Sinclair Grooms, MD;  Location: [facility] ENDOSCOPY;  Service: Cardiovascular;  Laterality: N/A;  h/p from 11/8 in file drawer/dl   Family History  Problem Relation Age of Onset  . Other Sister    History  Substance Use Topics  . Smoking status: Never Smoker   . Smokeless tobacco: Not on file  . Alcohol Use: No   OB History   Grav Para Term Preterm Abortions TAB SAB Ect Mult Living                 Review of Systems  Constitutional: Negative for fever.  Gastrointestinal: Positive for nausea, abdominal pain (cramping), constipation and flatus. Negative for vomiting.  All other systems reviewed and are negative.     Allergies  Amiodarone; Amoxicillin; Amoxicillin-pot clavulanate; Aspirin; Ciprofloxacin; Codeine; Diuretic; Iohexol; Lisinopril; Midazolam hcl; Quinolones; Sotalol; Sulfa antibiotics; Thioxanthenes; Valium; Vancomycin; and Versed  Home Medications   Prior to Admission medications   Medication Sig Start Date End Date Taking? Authorizing Provider  apixaban (ELIQUIS) 2.5 MG TABS tablet Take 2.5 mg by mouth 2 (two) times daily.   Yes Historical Provider, MD  Ascorbic Acid (VITAMIN C) 1000 MG tablet Take 1,000 mg by mouth daily.   Yes Historical Provider, MD  atenolol (TENORMIN) 25 MG tablet 1 tablet daily 01/19/14  Yes Belva Crome III, MD  cephALEXin (KEFLEX) 250 MG capsule Take 250 mg by mouth daily. For 5 days   Yes Historical Provider, MD  CHIA SEED PO Take 5 mLs by mouth daily.    Yes Historical Provider, MD  cholecalciferol (VITAMIN D) 1000 UNITS tablet Take 1,000 Units by mouth daily.   Yes Historical Provider, MD  Coenzyme Q10 150 MG CAPS Take by mouth 2 times daily at 12 noon and 4 pm.   Yes Historical Provider, MD  Cyanocobalamin (VITAMIN B-12) 1000 MCG SUBL Place 1,000 mcg under the tongue.   Yes Historical Provider, MD  docusate sodium (COLACE) 100 MG  capsule Take 100 mg by mouth daily as needed for mild constipation.   Yes Historical Provider, MD  LORazepam (ATIVAN) 0.5 MG tablet Take 0.5 mg by mouth at bedtime as needed. For sleep   Yes Historical Provider, MD  OVER THE COUNTER MEDICATION Take 1 each by mouth daily. Hemp heart seeds   Yes Historical Provider, MD  OVER THE COUNTER MEDICATION Take 1 each by mouth daily. Greens plus supplement   Yes Historical Provider, MD  polyethylene glycol (MIRALAX / GLYCOLAX) packet Take 17 g by mouth 2 (two) times daily. 05/01/12  Yes Lezlie Octave Black, NP  polyvinyl alcohol (LIQUIFILM TEARS) 1.4 % ophthalmic solution Place 1 drop into both eyes as needed. For dry eyes   Yes Historical Provider, MD  Probiotic Product (PROBIOTIC DAILY PO) Take by mouth daily. 10 Billi CFU   Yes Historical Provider, MD  Sodium Phosphates (ENEMA) 7-19 GM/118ML ENEM Place 1 enema rectally once.   Yes Historical Provider, MD   BP 119/76  Pulse 66  Temp(Src) 97.9 F (36.6 C) (Oral)  Resp 18  SpO2 96% Physical Exam  Nursing note and vitals reviewed. Constitutional: She is oriented to person, place, and time. She appears well-developed and well-nourished. No distress.  HENT:  Head: Normocephalic and atraumatic.  Mouth/Throat: Oropharynx is clear and moist.  Eyes: Conjunctivae are normal. Pupils are equal, round, and reactive to light. No scleral icterus.  Neck: Neck supple.  Cardiovascular: Normal rate, regular rhythm, normal heart sounds and intact distal pulses.   No murmur heard. Pulmonary/Chest: Effort normal and breath sounds normal. No stridor. No respiratory distress. She has no rales.  Abdominal: Soft. Bowel sounds are normal. She exhibits no distension. There is no tenderness. There is no rebound and no guarding.  Musculoskeletal: Normal range of motion.  Neurological: She is alert and oriented to person, place, and time.  Skin: Skin is warm and dry. No rash noted.  Psychiatric: She has a normal mood and affect.  Her behavior is normal.    ED Course  Procedures (including critical care time) Labs Review Labs Reviewed  CBC WITH DIFFERENTIAL - Abnormal; Notable for the following:    WBC 10.9 (*)    Neutrophils Relative % 80 (*)    Neutro Abs 8.7 (*)    All other components within normal limits  URINALYSIS, ROUTINE W REFLEX MICROSCOPIC - Abnormal; Notable for the following:    Ketones, ur 15 (*)    All other components within normal limits  COMPREHENSIVE METABOLIC PANEL - Abnormal; Notable for the following:    Sodium 130 (*)    Chloride 89 (*)    Total Protein 8.6 (*)    Total Bilirubin 1.4 (*)    GFR calc non Af Amer 83 (*)    All other  components within normal limits  CLOSTRIDIUM DIFFICILE BY PCR  OSMOLALITY  SODIUM, URINE, RANDOM    Imaging Review Ct Abdomen Pelvis W Contrast  03/19/2014   CLINICAL DATA:  Breast cancer, low pelvic pain  EXAM: CT ABDOMEN AND PELVIS WITH CONTRAST  TECHNIQUE: Multidetector CT imaging of the abdomen and pelvis was performed using the standard protocol following bolus administration of intravenous contrast.  CONTRAST:  23mL OMNIPAQUE IOHEXOL 300 MG/ML  SOLN  COMPARISON:  05/08/2010  FINDINGS: The lung bases are clear. There is cardiomegaly.  The liver demonstrates no focal abnormality. There is no intrahepatic or extrahepatic biliary ductal dilatation. The gallbladder is normal. The spleen demonstrates no focal abnormality. The kidneys, adrenal glands and pancreas are normal. The bladder is unremarkable.  The stomach, duodenum, small intestine, and large intestine demonstrate no contrast extravasation or dilatation. There is sigmoid colon bowel wall thickening and pericolonic inflammatory change concerning for mild colitis. There is no focal fluid collection to suggest an abscess. There is no pneumoperitoneum, pneumatosis, or portal venous gas. There is no abdominal or pelvic free fluid. There is no lymphadenopathy.  The abdominal aorta is normal in caliber with  atherosclerosis.  There are no lytic or sclerotic osseous lesions. There is grade 1 anterolisthesis of L4 on L5 secondary to facet arthropathy. There is degenerative disc disease at L4-5. There is minimal retrolisthesis of L3-L4. There is minimal retrolisthesis of L2 on L3.  IMPRESSION: There is sigmoid colon bowel wall thickening and pericolonic inflammatory change concerning for mild colitis. There is no focal fluid collection to suggest an abscess.   Electronically Signed   By: Kathreen Devoid   On: 03/19/2014 12:03   Dg Abd Acute W/chest  03/19/2014   CLINICAL DATA:  Abdominal pain  EXAM: ACUTE ABDOMEN SERIES (ABDOMEN 2 VIEW & CHEST 1 VIEW)  COMPARISON:  None.  FINDINGS: Cardiac shadow is enlarged. Postsurgical changes are noted consistent with valve repair. The lungs are well aerated bilaterally. Mild interstitial changes are seen. No focal confluent infiltrate is noted. Calcified granuloma is noted in the right lung base. The abdomen shows a nonobstructive bowel gas pattern. Fecal material is noted within the colon. No acute bony abnormality is seen. No free air is noted.  IMPRESSION: No acute abnormality noted.   Electronically Signed   By: Inez Catalina M.D.   On: 03/19/2014 08:57  All radiology studies independently viewed by me.      EKG Interpretation None      MDM   Final diagnoses:  Colitis    80 year old female presenting with constipation. Well-appearing and comfortable on my initial exam. She has no abdominal tenderness on exam. Low suspicion for bowel obstruction.  History and exam due not support mesenteric ischemia, cholangitis, appendicitis, or other significant intra-abdominal pathology.  AAS was unremarkable and rectal exam showed no stool in rectal vault.  She continued to have intermittent abdominal pain.   Therefore, CT was obtained which showed colitis.  She was also recently on ABX for a UTI (Keflex).  C Diff colitis considered and treated empirically with metronidazole.   She cannot take cipro or penicillins due to allergies.  Admitted to internal medicine.    Arbie Cookey, MD 03/19/14 2007

## 2014-03-19 NOTE — ED Notes (Signed)
Pt states that she hasn't had a bm in three to four days, she complains of abdominal pain and abdominal distention. Pt states that mucus comes when she thinks she' having a BM

## 2014-03-19 NOTE — ED Notes (Signed)
Patient transported to CT 

## 2014-03-19 NOTE — ED Notes (Signed)
Kristi RN and [facility] WILKINS TO START IV

## 2014-03-19 NOTE — ED Notes (Signed)
Pt hasn't vomited but feels nauseated

## 2014-03-20 DIAGNOSIS — K5289 Other specified noninfective gastroenteritis and colitis: Principal | ICD-10-CM

## 2014-03-20 DIAGNOSIS — I4891 Unspecified atrial fibrillation: Secondary | ICD-10-CM

## 2014-03-20 LAB — BASIC METABOLIC PANEL
Anion gap: 14 (ref 5–15)
BUN: 11 mg/dL (ref 6–23)
CO2: 21 mEq/L (ref 19–32)
Calcium: 8.2 mg/dL — ABNORMAL LOW (ref 8.4–10.5)
Chloride: 96 mEq/L (ref 96–112)
Creatinine, Ser: 0.52 mg/dL (ref 0.50–1.10)
GFR calc Af Amer: >90 mL/min (ref >90)
GFR calc non Af Amer: 84 mL/min — ABNORMAL LOW (ref >90)
Glucose, Bld: 123 mg/dL — ABNORMAL HIGH (ref 70–99)
Potassium: 4.4 mEq/L (ref 3.7–5.3)
Sodium: 131 mEq/L — ABNORMAL LOW (ref 137–147)

## 2014-03-20 LAB — CBC
HCT: 32.8 % — ABNORMAL LOW (ref 36.0–46.0)
Hemoglobin: 11.2 g/dL — ABNORMAL LOW (ref 12.0–15.0)
MCH: 33.9 pg (ref 26.0–34.0)
MCHC: 34.1 g/dL (ref 30.0–36.0)
MCV: 99.4 fL (ref 78.0–100.0)
Platelets: 144 10*3/uL — ABNORMAL LOW (ref 150–400)
RBC: 3.3 MIL/uL — ABNORMAL LOW (ref 3.87–5.11)
RDW: 12.7 % (ref 11.5–15.5)
WBC: 8.8 10*3/uL (ref 4.0–10.5)

## 2014-03-20 LAB — CLOSTRIDIUM DIFFICILE BY PCR: Toxigenic C. Difficile by PCR: NEGATIVE

## 2014-03-20 LAB — OSMOLALITY: Osmolality: 271 mOsm/kg — ABNORMAL LOW (ref 275–300)

## 2014-03-20 MED ORDER — SACCHAROMYCES BOULARDII 250 MG PO CAPS
250.0000 mg | ORAL_CAPSULE | Freq: Two times a day (BID) | ORAL | Status: DC
  Administered 2014-03-20 – 2014-03-21 (×3): 250 mg via ORAL
  Filled 2014-03-20 (×4): qty 1

## 2014-03-20 MED ORDER — TRAMADOL HCL 50 MG PO TABS
50.0000 mg | ORAL_TABLET | Freq: Four times a day (QID) | ORAL | Status: DC | PRN
  Administered 2014-03-20: 50 mg via ORAL
  Filled 2014-03-20: qty 1

## 2014-03-20 MED ORDER — SODIUM CHLORIDE 0.9 % IV SOLN
INTRAVENOUS | Status: AC
  Administered 2014-03-20: 16:00:00 via INTRAVENOUS

## 2014-03-20 NOTE — Progress Notes (Signed)
[facility] PROGRESS NOTE  [name] DUK:[id number] DOB: [birth year] DOA: 03/19/2014 PCP: Henrine Screws, MD  Assessment/Plan  Sigmoid colitis  -  Continue flagyl and consider addition of ceftriaxone if not improving -  C diff pending -  Enteric precautions -  Continue IVF and antiemetics -  Advance diet as tolerated -  Ultram as needed for pain  Atrial fibrillation, rate controlled.  Continue Eliquis.   Hyponatremia likely due to dehydration, mild and improving some with IVF -  Continue IVF  Mild leukocytosis likely related to colitis, resolved with antibiotics  Diet:  full Access:  PIV IVF:  off Proph:  apixaban  Code Status: full Family Communication: patient alone Disposition Plan: pending improvement in diarrhea   Consultants:  None  Procedures:  9/11:  CT scan with sigmoid colitis  Antibiotics:  Flagyl 9/11 >>   HPI/Subjective:  Having apple sauce like diarrhea with abdominal cramping and bloating.  Denies fevers, chills.  Has some nausea, but no vomiting  Objective: Filed Vitals:   03/19/14 1328 03/19/14 1430 03/19/14 2107 03/20/14 0547  BP: 122/72 122/79 134/65 100/69  Pulse: 80 89 79 60  Temp: 98.7 F (37.1 C) 98.1 F (36.7 C) 97.8 F (36.6 C) 98 F (36.7 C)  TempSrc: Oral Oral Oral Oral  Resp: 16 16 16 16   Height:      Weight:      SpO2: 98% 99% 99% 97%    Intake/Output Summary (Last 24 hours) at 03/20/14 0810 Last data filed at 03/20/14 0700  Gross per 24 hour  Intake   1115 ml  Output      0 ml  Net   1115 ml   Filed Weights   03/19/14 0749  Weight: 58.968 kg (130 lb)    Exam:   General:  Thin WF, No acute distress  HEENT:  NCAT, MMM  Cardiovascular:  IRRR, nl S1, S2 no mrg, 2+ pulses, warm extremities  Respiratory:  CTAB, no increased WOB  Abdomen:   Hyperactive BS, soft, ND, TTP in the LLQ without rebound or guarding.   MSK:   Normal tone and bulk, no LEE  Neuro:  Grossly intact  Data  Reviewed: Basic Metabolic Panel:  Recent Labs Lab 03/19/14 0833  NA 130*  K 4.9  CL 89*  CO2 26  GLUCOSE 99  BUN 13  CREATININE 0.54  CALCIUM 9.7   Liver Function Tests:  Recent Labs Lab 03/19/14 0833  AST 36  ALT 28  ALKPHOS 59  BILITOT 1.4*  PROT 8.6*  ALBUMIN 4.1   No results found for this basename: LIPASE, AMYLASE,  in the last 168 hours No results found for this basename: AMMONIA,  in the last 168 hours CBC:  Recent Labs Lab 03/19/14 0833  WBC 10.9*  NEUTROABS 8.7*  HGB 13.7  HCT 40.2  MCV 99.8  PLT 168   Cardiac Enzymes: No results found for this basename: CKTOTAL, CKMB, CKMBINDEX, TROPONINI,  in the last 168 hours BNP (last 3 results) No results found for this basename: PROBNP,  in the last 8760 hours CBG: No results found for this basename: GLUCAP,  in the last 168 hours  No results found for this or any previous visit (from the past 240 hour(s)).   Studies: Ct Abdomen Pelvis W Contrast  03/19/2014   CLINICAL DATA:  Breast cancer, low pelvic pain  EXAM: CT ABDOMEN AND PELVIS WITH CONTRAST  TECHNIQUE: Multidetector CT imaging of the abdomen and pelvis was performed using the  standard protocol following bolus administration of intravenous contrast.  CONTRAST:  45mL OMNIPAQUE IOHEXOL 300 MG/ML  SOLN  COMPARISON:  05/08/2010  FINDINGS: The lung bases are clear. There is cardiomegaly.  The liver demonstrates no focal abnormality. There is no intrahepatic or extrahepatic biliary ductal dilatation. The gallbladder is normal. The spleen demonstrates no focal abnormality. The kidneys, adrenal glands and pancreas are normal. The bladder is unremarkable.  The stomach, duodenum, small intestine, and large intestine demonstrate no contrast extravasation or dilatation. There is sigmoid colon bowel wall thickening and pericolonic inflammatory change concerning for mild colitis. There is no focal fluid collection to suggest an abscess. There is no pneumoperitoneum,  pneumatosis, or portal venous gas. There is no abdominal or pelvic free fluid. There is no lymphadenopathy.  The abdominal aorta is normal in caliber with atherosclerosis.  There are no lytic or sclerotic osseous lesions. There is grade 1 anterolisthesis of L4 on L5 secondary to facet arthropathy. There is degenerative disc disease at L4-5. There is minimal retrolisthesis of L3-L4. There is minimal retrolisthesis of L2 on L3.  IMPRESSION: There is sigmoid colon bowel wall thickening and pericolonic inflammatory change concerning for mild colitis. There is no focal fluid collection to suggest an abscess.   Electronically Signed   By: Kathreen Devoid   On: 03/19/2014 12:03   Dg Abd Acute W/chest  03/19/2014   CLINICAL DATA:  Abdominal pain  EXAM: ACUTE ABDOMEN SERIES (ABDOMEN 2 VIEW & CHEST 1 VIEW)  COMPARISON:  None.  FINDINGS: Cardiac shadow is enlarged. Postsurgical changes are noted consistent with valve repair. The lungs are well aerated bilaterally. Mild interstitial changes are seen. No focal confluent infiltrate is noted. Calcified granuloma is noted in the right lung base. The abdomen shows a nonobstructive bowel gas pattern. Fecal material is noted within the colon. No acute bony abnormality is seen. No free air is noted.  IMPRESSION: No acute abnormality noted.   Electronically Signed   By: Inez Catalina M.D.   On: 03/19/2014 08:57    Scheduled Meds: . apixaban  2.5 mg Oral BID  . atenolol  25 mg Oral Daily  . metronidazole  500 mg Intravenous Q8H   Continuous Infusions: . sodium chloride 75 mL/hr at 03/19/14 2244    Active Problems:   Atrial fibrillation   H/O mitral valve repair   Colitis    Time spent: 30 min    Meylin Stenzel, [facility] Pager [phone]. If 7PM-7AM, please contact night-coverage at [email or web address], password [facility] 03/20/2014, 8:10 AM  LOS: 1 day

## 2014-03-21 ENCOUNTER — Encounter (HOSPITAL_COMMUNITY): Payer: Self-pay | Admitting: Internal Medicine

## 2014-03-21 DIAGNOSIS — D649 Anemia, unspecified: Secondary | ICD-10-CM

## 2014-03-21 DIAGNOSIS — E871 Hypo-osmolality and hyponatremia: Secondary | ICD-10-CM

## 2014-03-21 HISTORY — DX: Anemia, unspecified: D64.9

## 2014-03-21 LAB — BASIC METABOLIC PANEL
Anion gap: 15 (ref 5–15)
BUN: 9 mg/dL (ref 6–23)
CO2: 21 mEq/L (ref 19–32)
Calcium: 8.4 mg/dL (ref 8.4–10.5)
Chloride: 98 mEq/L (ref 96–112)
Creatinine, Ser: 0.53 mg/dL (ref 0.50–1.10)
GFR calc Af Amer: >90 mL/min (ref >90)
GFR calc non Af Amer: 84 mL/min — ABNORMAL LOW (ref >90)
Glucose, Bld: 80 mg/dL (ref 70–99)
Potassium: 4.3 mEq/L (ref 3.7–5.3)
Sodium: 134 mEq/L — ABNORMAL LOW (ref 137–147)

## 2014-03-21 LAB — CBC
HCT: 32.6 % — ABNORMAL LOW (ref 36.0–46.0)
Hemoglobin: 10.9 g/dL — ABNORMAL LOW (ref 12.0–15.0)
MCH: 33.5 pg (ref 26.0–34.0)
MCHC: 33.4 g/dL (ref 30.0–36.0)
MCV: 100.3 fL — ABNORMAL HIGH (ref 78.0–100.0)
Platelets: 149 10*3/uL — ABNORMAL LOW (ref 150–400)
RBC: 3.25 MIL/uL — ABNORMAL LOW (ref 3.87–5.11)
RDW: 12.8 % (ref 11.5–15.5)
WBC: 8.5 10*3/uL (ref 4.0–10.5)

## 2014-03-21 MED ORDER — CEPHALEXIN 500 MG PO CAPS: 500.0000 mg | ORAL_CAPSULE | Freq: Two times a day (BID) | ORAL | Status: DC

## 2014-03-21 MED ORDER — METRONIDAZOLE 500 MG PO TABS: 500.0000 mg | ORAL_TABLET | Freq: Three times a day (TID) | ORAL | Status: DC

## 2014-03-21 MED ORDER — DEXTROSE 5 % IV SOLN
2.0000 g | INTRAVENOUS | Status: DC
  Administered 2014-03-21: 2 g via INTRAVENOUS
  Filled 2014-03-21: qty 2

## 2014-03-21 MED ORDER — CEPHALEXIN 500 MG PO CAPS
500.0000 mg | ORAL_CAPSULE | Freq: Two times a day (BID) | ORAL | Status: DC
  Filled 2014-03-21 (×2): qty 1

## 2014-03-21 MED ORDER — SACCHAROMYCES BOULARDII 250 MG PO CAPS: 250.0000 mg | ORAL_CAPSULE | Freq: Two times a day (BID) | ORAL | Status: DC

## 2014-03-21 MED ORDER — METRONIDAZOLE 500 MG PO TABS
500.0000 mg | ORAL_TABLET | Freq: Three times a day (TID) | ORAL | Status: DC
  Filled 2014-03-21 (×3): qty 1

## 2014-03-21 NOTE — Progress Notes (Signed)
ANTIBIOTIC CONSULT NOTE - INITIAL  Pharmacy Consult for Ceftriaxone Indication: Colitis  Allergies  Allergen Reactions  . Amiodarone     sick  . Amoxicillin Nausea And Vomiting  . Amoxicillin-Pot Clavulanate Nausea And Vomiting  . Aspirin     Unknown... 325 mg and more.  . Ciprofloxacin     Flu symptoms  . Codeine Nausea And Vomiting  . Diuretic [Buchu-Cornsilk-Ch Grass-Hydran]     Pharmacy record  . Iohexol Hives     Code: HIVES, Desc: **11/22/08 **NO REACTION W/ OMNI 300// PT S/P 3 CT SCANS W/ IV CM AND NO HIVES/MMS**PT STATED HIVES 16 YRS AGO. POSSIBLE IV DYE W/ CT/XRAY. MEDICATED W/ 50MG  OF BENEDRYL PO PRIOR TO SCAN, Onset Date: [id number]   . Lisinopril     cough  . Midazolam Hcl Nausea And Vomiting  . Quinolones Other (See Comments)    Flu like symptoms  . Sotalol     Unknown   . Sulfa Antibiotics Other (See Comments)    Flu like symptoms 'serum sickness'  . Thioxanthenes Other (See Comments)    Flu like symptoms 'serum sickness'  . Valium Nausea And Vomiting  . Vancomycin     unknown  . Versed [Midazolam] Nausea And Vomiting    Patient Measurements: Height: 5\' 3"  (160 cm) Weight: 130 lb (58.968 kg) IBW/kg (Calculated) : 52.4  Vital Signs: Temp: 98 F (36.7 C) (09/13 0623) Temp src: Oral (09/13 0623) BP: 114/67 mmHg (09/13 0623) Pulse Rate: 67 (09/13 0623) Intake/Output from previous day: 09/12 0701 - 09/13 0700 In: 440 [P.O.:440] Out: -  Intake/Output from this shift:    Labs:  Recent Labs  03/19/14 0833 03/20/14 0834 03/21/14 0550  WBC 10.9* 8.8 8.5  HGB 13.7 11.2* 10.9*  PLT 168 144* 149*  CREATININE 0.54 0.52 0.53   Estimated Creatinine Clearance: 41.8 ml/min (by C-G formula based on Cr of 0.53). No results found for this basename: VANCOTROUGH, Corlis Leak, VANCORANDOM, [place], [place], [place], TOBRATROUGH, TOBRAPEAK, TOBRARND, AMIKACINPEAK, AMIKACINTROU, AMIKACIN,  in the last 72 hours   Microbiology: Recent Results (from the  past 720 hour(s))  CLOSTRIDIUM DIFFICILE BY PCR     Status: None   Collection Time    03/20/14 12:43 AM      Result Value Ref Range Status   C difficile by pcr NEGATIVE  NEGATIVE Final   Comment: Performed at [facility] History: Past Medical History  Diagnosis Date  . Anxiety   . Atrial fibrillation     s/p MAZE 2010  . GERD (gastroesophageal reflux disease)   . Complication of anesthesia     Nausea from versed  . Atypical atrial flutter   . History of mitral valve repair     Mitral valve prolapse, surgery in 2010  . Rheumatic fever     age 80  . Cancer     1998 Right breast cancer with lumpectomy and radiation therapy  . Arthritis     Some in neck and back, but "can't complain"    Medications:  Scheduled:  . apixaban  2.5 mg Oral BID  . atenolol  25 mg Oral Daily  . cefTRIAXone (ROCEPHIN)  IV  2 g Intravenous Q24H  . metronidazole  500 mg Intravenous Q8H  . saccharomyces boulardii  250 mg Oral BID   Assessment: 80 yr old female with chief complaint of abd pain and constipation. Recently diagnosed with UTI and had 5 days of Keflex. CT abdomen/ pelvis was done, which showed  mild sigmoid colitis. IV flagyl started. Pharmacy consulted to dose ceftriaxone.   Goal of Therapy:  Eradication of infection  Plan:  Ceftriaxone 2gm IV q24h   Dolly Rias RPh 03/21/2014, 8:22 AM Pager [phone]

## 2014-03-21 NOTE — Discharge Summary (Signed)
Physician Discharge Summary  [name] RCV:[id number] DOB: 07-May-1926 DOA: 03/19/2014  PCP: Henrine Screws, MD  Admit date: 03/19/2014 Discharge date: 03/21/2014  Recommendations for Outpatient Follow-up:  1. Follow up with primary care doctor in 2 weeks:  Repeat CBC and BMP to follow up hyponatremia, anemia, and thrombocytopenia 2. Follow up with gastroenterologist, Dr. Watt Climes, after antibiotics have been completed 3. Keflex and flagyl x 5 more days + florastor 4. Consider referral to allergist to determine which antibiotics she is truly allergic to and which she is simply intolerant to  Discharge Diagnoses:  Principal Problem:   Colitis Active Problems:   Atrial fibrillation   H/O mitral valve repair   Hyponatremia   Normocytic anemia   Discharge Condition: stable, improved  Diet recommendation:  regular  Wt Readings from Last 3 Encounters:  03/19/14 58.968 kg (130 lb)  01/20/14 58.968 kg (130 lb)  01/19/14 61.236 kg (135 lb)    History of present illness:  80 yr old female who has a past medical history of Anxiety; Atrial fibrillation; GERD (gastroesophageal reflux disease); Complication of anesthesia; Atypical atrial flutter; History of mitral valve repair; Rheumatic fever; Cancer; and Arthritis.  Came to the ED with chief complaint of abdominal pain and constipation for past 3 days. Patient recently was diagnosed with UTI and was prescribed 5 days of Keflex, she completed the course on Wednesday.  In the ED CT showed mild sigmoid colitis.   Hospital Course:   Sigmoid colitis, possibly irritation from constipation, mild infection.  She was started on clear liquid diet, ceftriaxone and flagyl.  C. Diff PCR was negative.  She had quick resolution of her diarrhea and had a formed stool on the day of discharge.  Recommend keflex + flagyl for 5 days and follow up with Dr. Watt Climes after she has completed her treatment.  Atrial fibrillation, rate controlled. Continue Eliquis.   Hyponatremia likely due to dehydration, mild and improved with IVF  Mild leukocytosis likely related to colitis, resolved with antibiotics. Normocytic anemia and very mild thrombocytopenia, likely hemodilutional and due to inflammation from acute illness.  Recommend repeat CBC by PCP as outpatient and further eval for anemia if persistent and work up not already complete.     Consultants:  None Procedures:  9/11: CT scan with sigmoid colitis Antibiotics:  Flagyl 9/11 >>  Ceftriaxone 9/12 >>  Discharge Exam: Filed Vitals:   03/21/14 1400  BP: 143/84  Pulse: 80  Temp: 97.5 F (36.4 C)  Resp: 18   Filed Vitals:   03/20/14 2134 03/21/14 0623 03/21/14 0944 03/21/14 1400  BP: 147/61 114/67 117/88 143/84  Pulse: 93 67 85 80  Temp: 97.7 F (36.5 C) 98 F (36.7 C)  97.5 F (36.4 C)  TempSrc: Oral Oral  Oral  Resp: 18 16  18   Height:      Weight:      SpO2: 98% 96% 98% 98%    General: Thin WF, No acute distress  HEENT: NCAT, MMM  Cardiovascular: IRRR, nl S1, S2 no mrg, 2+ pulses, warm extremities  Respiratory: CTAB, no increased WOB  Abdomen: NABS, soft, ND, NT MSK: Normal tone and bulk, no LEE  Neuro: Grossly intact   Discharge Instructions      Discharge Instructions   Call MD for:  difficulty breathing, headache or visual disturbances    Complete by:  As directed      Call MD for:  extreme fatigue    Complete by:  As directed  Call MD for:  hives    Complete by:  As directed      Call MD for:  persistant dizziness or light-headedness    Complete by:  As directed      Call MD for:  persistant nausea and vomiting    Complete by:  As directed      Call MD for:  severe uncontrolled pain    Complete by:  As directed      Call MD for:  temperature >100.4    Complete by:  As directed      Diet general    Complete by:  As directed      Discharge instructions    Complete by:  As directed   You were hospitalized with some colitis.  You may simply have had some  irritation from constipation and using an enema or a bacterial infection.  You did NOT have C. Diff colitis which can be caused by exposure to antibiotics.  Please take keflex and flagyl for 5 days and then stop.  Use florastor to reduce your risk of C. Diff colitis.  Touch base with Dr. Perley Jain office in a few weeks and please see Dr. Inda Merlin in about two weeks to have your blood work repeated.  Consider getting tested for allergy to antibiotics by an allergist.  Please take colace and miralax and eat a high fiber diet once you are feeling better and your diarrhea has fully resolved to prevent constipation.  You can increase your miralax as needed to have at about 2 soft bowel movements per day.     Increase activity slowly    Complete by:  As directed             Medication List         atenolol 25 MG tablet  Commonly known as:  TENORMIN  1 tablet daily     cephALEXin 500 MG capsule  Commonly known as:  KEFLEX  Take 1 capsule (500 mg total) by mouth 2 (two) times daily.     CHIA SEED PO  Take 5 mLs by mouth daily.     cholecalciferol 1000 UNITS tablet  Commonly known as:  VITAMIN D  Take 1,000 Units by mouth daily.     Coenzyme Q10 150 MG Caps  Take by mouth 2 times daily at 12 noon and 4 pm.     docusate sodium 100 MG capsule  Commonly known as:  COLACE  Take 100 mg by mouth daily as needed for mild constipation.     ELIQUIS 2.5 MG Tabs tablet  Generic drug:  apixaban  Take 2.5 mg by mouth 2 (two) times daily.     Enema 7-19 GM/118ML Enem  Place 1 enema rectally once.     LORazepam 0.5 MG tablet  Commonly known as:  ATIVAN  Take 0.5 mg by mouth at bedtime as needed. For sleep     metroNIDAZOLE 500 MG tablet  Commonly known as:  FLAGYL  Take 1 tablet (500 mg total) by mouth every 8 (eight) hours.     OVER THE COUNTER MEDICATION  Take 1 each by mouth daily. Hemp heart seeds     OVER THE COUNTER MEDICATION  Take 1 each by mouth daily. Greens plus supplement      polyethylene glycol packet  Commonly known as:  MIRALAX / GLYCOLAX  Take 17 g by mouth 2 (two) times daily.     polyvinyl alcohol 1.4 % ophthalmic solution  Commonly known  as:  LIQUIFILM TEARS  Place 1 drop into both eyes as needed. For dry eyes     PROBIOTIC DAILY PO  Take by mouth daily. 10 Billi CFU     saccharomyces boulardii 250 MG capsule  Commonly known as:  FLORASTOR  Take 1 capsule (250 mg total) by mouth 2 (two) times daily.     Vitamin B-12 1000 MCG Subl  Place 1,000 mcg under the tongue.     vitamin C 1000 MG tablet  Take 1,000 mg by mouth daily.       Follow-up Information   Follow up with GATES,ROBERT NEVILL, MD. Schedule an appointment as soon as possible for a visit in 2 weeks.   Specialty:  Internal Medicine   Contact information:   [address] [place] [address] [place] [address] [phone]       Follow up with [facility], MD. Schedule an appointment as soon as possible for a visit in 1 month.   Specialty:  Gastroenterology   Contact information:   [address], [place] [address] [phone]        The results of significant diagnostics from this hospitalization (including imaging, microbiology, ancillary and laboratory) are listed below for reference.    Significant Diagnostic Studies: Ct Abdomen Pelvis W Contrast  03/19/2014   CLINICAL DATA:  Breast cancer, low pelvic pain  EXAM: CT ABDOMEN AND PELVIS WITH CONTRAST  TECHNIQUE: Multidetector CT imaging of the abdomen and pelvis was performed using the standard protocol following bolus administration of intravenous contrast.  CONTRAST:  34mL OMNIPAQUE IOHEXOL 300 MG/ML  SOLN  COMPARISON:  05/08/2010  FINDINGS: The lung bases are clear. There is cardiomegaly.  The liver demonstrates no focal abnormality. There is no intrahepatic or extrahepatic biliary ductal dilatation. The gallbladder is normal. The spleen demonstrates no focal abnormality. The kidneys, adrenal glands and  pancreas are normal. The bladder is unremarkable.  The stomach, duodenum, small intestine, and large intestine demonstrate no contrast extravasation or dilatation. There is sigmoid colon bowel wall thickening and pericolonic inflammatory change concerning for mild colitis. There is no focal fluid collection to suggest an abscess. There is no pneumoperitoneum, pneumatosis, or portal venous gas. There is no abdominal or pelvic free fluid. There is no lymphadenopathy.  The abdominal aorta is normal in caliber with atherosclerosis.  There are no lytic or sclerotic osseous lesions. There is grade 1 anterolisthesis of L4 on L5 secondary to facet arthropathy. There is degenerative disc disease at L4-5. There is minimal retrolisthesis of L3-L4. There is minimal retrolisthesis of L2 on L3.  IMPRESSION: There is sigmoid colon bowel wall thickening and pericolonic inflammatory change concerning for mild colitis. There is no focal fluid collection to suggest an abscess.   Electronically Signed   By: Kathreen Devoid   On: 03/19/2014 12:03   Dg Abd Acute W/chest  03/19/2014   CLINICAL DATA:  Abdominal pain  EXAM: ACUTE ABDOMEN SERIES (ABDOMEN 2 VIEW & CHEST 1 VIEW)  COMPARISON:  None.  FINDINGS: Cardiac shadow is enlarged. Postsurgical changes are noted consistent with valve repair. The lungs are well aerated bilaterally. Mild interstitial changes are seen. No focal confluent infiltrate is noted. Calcified granuloma is noted in the right lung base. The abdomen shows a nonobstructive bowel gas pattern. Fecal material is noted within the colon. No acute bony abnormality is seen. No free air is noted.  IMPRESSION: No acute abnormality noted.   Electronically Signed   By: Linus Mako.D.  On: 03/19/2014 08:57    Microbiology: Recent Results (from the past 240 hour(s))  CLOSTRIDIUM DIFFICILE BY PCR     Status: None   Collection Time    03/20/14 12:43 AM      Result Value Ref Range Status   C difficile by pcr NEGATIVE   NEGATIVE Final   Comment: Performed at [facility]: Basic Metabolic Panel:  Recent Labs Lab 03/19/14 0833 03/20/14 0834 03/21/14 0550  NA 130* 131* 134*  K 4.9 4.4 4.3  CL 89* 96 98  CO2 26 21 21   GLUCOSE 99 123* 80  BUN 13 11 9   CREATININE 0.54 0.52 0.53  CALCIUM 9.7 8.2* 8.4   Liver Function Tests:  Recent Labs Lab 03/19/14 0833  AST 36  ALT 28  ALKPHOS 59  BILITOT 1.4*  PROT 8.6*  ALBUMIN 4.1   No results found for this basename: LIPASE, AMYLASE,  in the last 168 hours No results found for this basename: AMMONIA,  in the last 168 hours CBC:  Recent Labs Lab 03/19/14 0833 03/20/14 0834 03/21/14 0550  WBC 10.9* 8.8 8.5  NEUTROABS 8.7*  --   --   HGB 13.7 11.2* 10.9*  HCT 40.2 32.8* 32.6*  MCV 99.8 99.4 100.3*  PLT 168 144* 149*   Cardiac Enzymes: No results found for this basename: CKTOTAL, CKMB, CKMBINDEX, TROPONINI,  in the last 168 hours BNP: BNP (last 3 results) No results found for this basename: PROBNP,  in the last 8760 hours CBG: No results found for this basename: GLUCAP,  in the last 168 hours  Time coordinating discharge: 35 minutes  Signed:  Taronda Comacho  [facility] 03/21/2014, 3:27 PM

## 2014-03-21 NOTE — Progress Notes (Signed)
Patient discharged to home. Discharge instructions reviewed with patient by charge nurse Aviva Kluver, RN.

## 2014-03-22 DIAGNOSIS — K5289 Other specified noninfective gastroenteritis and colitis: Secondary | ICD-10-CM | POA: Diagnosis not present

## 2014-03-22 LAB — SODIUM, URINE, RANDOM: Sodium, Ur: 22 mEq/L

## 2014-04-01 ENCOUNTER — Other Ambulatory Visit: Payer: Self-pay | Admitting: Interventional Cardiology

## 2014-04-07 DIAGNOSIS — D500 Iron deficiency anemia secondary to blood loss (chronic): Secondary | ICD-10-CM | POA: Diagnosis not present

## 2014-04-07 DIAGNOSIS — K5900 Constipation, unspecified: Secondary | ICD-10-CM | POA: Diagnosis not present

## 2014-04-07 DIAGNOSIS — R933 Abnormal findings on diagnostic imaging of other parts of digestive tract: Secondary | ICD-10-CM | POA: Diagnosis not present

## 2014-06-02 ENCOUNTER — Encounter (HOSPITAL_COMMUNITY): Payer: Self-pay | Admitting: Emergency Medicine

## 2014-06-02 ENCOUNTER — Emergency Department (HOSPITAL_COMMUNITY)
Admission: EM | Admit: 2014-06-02 | Discharge: 2014-06-02 | Disposition: A | Discharge status: Home / Self Care | Payer: Medicare Other | Attending: Emergency Medicine | Admitting: Emergency Medicine

## 2014-06-02 ENCOUNTER — Emergency Department (HOSPITAL_COMMUNITY): Payer: Medicare Other

## 2014-06-02 DIAGNOSIS — Z880 Allergy status to penicillin: Secondary | ICD-10-CM | POA: Diagnosis not present

## 2014-06-02 DIAGNOSIS — D649 Anemia, unspecified: Secondary | ICD-10-CM | POA: Diagnosis not present

## 2014-06-02 DIAGNOSIS — Z853 Personal history of malignant neoplasm of breast: Secondary | ICD-10-CM | POA: Insufficient documentation

## 2014-06-02 DIAGNOSIS — R200 Anesthesia of skin: Secondary | ICD-10-CM | POA: Insufficient documentation

## 2014-06-02 DIAGNOSIS — Z7901 Long term (current) use of anticoagulants: Secondary | ICD-10-CM | POA: Diagnosis not present

## 2014-06-02 DIAGNOSIS — M79602 Pain in left arm: Secondary | ICD-10-CM | POA: Diagnosis not present

## 2014-06-02 DIAGNOSIS — Z9889 Other specified postprocedural states: Secondary | ICD-10-CM | POA: Diagnosis not present

## 2014-06-02 DIAGNOSIS — I499 Cardiac arrhythmia, unspecified: Secondary | ICD-10-CM | POA: Diagnosis not present

## 2014-06-02 DIAGNOSIS — F419 Anxiety disorder, unspecified: Secondary | ICD-10-CM | POA: Insufficient documentation

## 2014-06-02 DIAGNOSIS — Z79899 Other long term (current) drug therapy: Secondary | ICD-10-CM | POA: Insufficient documentation

## 2014-06-02 DIAGNOSIS — R609 Edema, unspecified: Secondary | ICD-10-CM | POA: Insufficient documentation

## 2014-06-02 DIAGNOSIS — R404 Transient alteration of awareness: Secondary | ICD-10-CM | POA: Diagnosis not present

## 2014-06-02 DIAGNOSIS — I4891 Unspecified atrial fibrillation: Secondary | ICD-10-CM | POA: Diagnosis not present

## 2014-06-02 DIAGNOSIS — M5412 Radiculopathy, cervical region: Secondary | ICD-10-CM | POA: Insufficient documentation

## 2014-06-02 DIAGNOSIS — Z8719 Personal history of other diseases of the digestive system: Secondary | ICD-10-CM | POA: Insufficient documentation

## 2014-06-02 DIAGNOSIS — M79642 Pain in left hand: Secondary | ICD-10-CM | POA: Diagnosis present

## 2014-06-02 DIAGNOSIS — R531 Weakness: Secondary | ICD-10-CM | POA: Diagnosis not present

## 2014-06-02 DIAGNOSIS — R52 Pain, unspecified: Secondary | ICD-10-CM

## 2014-06-02 LAB — CBC WITH DIFFERENTIAL/PLATELET
Basophils Absolute: 0 10*3/uL (ref 0.0–0.1)
Basophils Relative: 0 % (ref 0–1)
Eosinophils Absolute: 0.1 10*3/uL (ref 0.0–0.7)
Eosinophils Relative: 1 % (ref 0–5)
HCT: 39.8 % (ref 36.0–46.0)
Hemoglobin: 13.3 g/dL (ref 12.0–15.0)
Lymphocytes Relative: 41 % (ref 12–46)
Lymphs Abs: 2 10*3/uL (ref 0.7–4.0)
MCH: 34 pg (ref 26.0–34.0)
MCHC: 33.4 g/dL (ref 30.0–36.0)
MCV: 101.8 fL — ABNORMAL HIGH (ref 78.0–100.0)
Monocytes Absolute: 0.4 10*3/uL (ref 0.1–1.0)
Monocytes Relative: 8 % (ref 3–12)
Neutro Abs: 2.4 10*3/uL (ref 1.7–7.7)
Neutrophils Relative %: 50 % (ref 43–77)
Platelets: 164 10*3/uL (ref 150–400)
RBC: 3.91 MIL/uL (ref 3.87–5.11)
RDW: 12.8 % (ref 11.5–15.5)
WBC: 4.8 10*3/uL (ref 4.0–10.5)

## 2014-06-02 LAB — I-STAT CHEM 8, ED
BUN: 14 mg/dL (ref 6–23)
Calcium, Ion: 1.12 mmol/L — ABNORMAL LOW (ref 1.13–1.30)
Chloride: 96 mEq/L (ref 96–112)
Creatinine, Ser: 0.6 mg/dL (ref 0.50–1.10)
Glucose, Bld: 113 mg/dL — ABNORMAL HIGH (ref 70–99)
HCT: 45 % (ref 36.0–46.0)
Hemoglobin: 15.3 g/dL — ABNORMAL HIGH (ref 12.0–15.0)
Potassium: 4.2 mEq/L (ref 3.7–5.3)
Sodium: 134 mEq/L — ABNORMAL LOW (ref 137–147)
TCO2: 27 mmol/L (ref 0–100)

## 2014-06-02 LAB — URINALYSIS, ROUTINE W REFLEX MICROSCOPIC
Bilirubin Urine: NEGATIVE
Glucose, UA: NEGATIVE mg/dL
Hgb urine dipstick: NEGATIVE
Ketones, ur: NEGATIVE mg/dL
Leukocytes, UA: NEGATIVE
Nitrite: NEGATIVE
Protein, ur: NEGATIVE mg/dL
Specific Gravity, Urine: 1.006 (ref 1.005–1.030)
Urobilinogen, UA: 0.2 mg/dL (ref 0.0–1.0)
pH: 7.5 (ref 5.0–8.0)

## 2014-06-02 LAB — I-STAT TROPONIN, ED: Troponin i, poc: 0 ng/mL (ref 0.00–0.08)

## 2014-06-02 NOTE — ED Notes (Signed)
EMS reports pt had left sided facial numbness and Lt hand numbness that started 2 hours ago. Pt states facial/hand numbness has resolved but her left hand hurts if she tries to make a fist. No other neuro deficits noted during triage.

## 2014-06-02 NOTE — ED Provider Notes (Signed)
CSN: [id number]     Arrival date & time 06/02/14  0343 History   First MD Initiated Contact with Patient 06/02/14 [phone]     Chief Complaint  Patient presents with  . Hand Pain  . Numbness     (Consider location/radiation/quality/duration/timing/severity/associated sxs/prior Treatment) Patient is a 80 y.o. female presenting with extremity pain. The history is provided by the patient.  Extremity Pain This is a new problem. The current episode started 12 to 24 hours ago. The problem occurs constantly. The problem has been resolved. Pertinent negatives include no chest pain, no abdominal pain, no headaches and no shortness of breath. Nothing aggravates the symptoms. Nothing relieves the symptoms. She has tried nothing for the symptoms. The treatment provided significant relief.  Facial numbness.  Resolved No changes in vision or speech or weakness  Past Medical History  Diagnosis Date  . Anxiety   . Atrial fibrillation     s/p MAZE 2010  . GERD (gastroesophageal reflux disease)   . Complication of anesthesia     Nausea from versed  . Atypical atrial flutter   . History of mitral valve repair     Mitral valve prolapse, surgery in 2010  . Rheumatic fever     age 89  . Cancer     1998 Right breast cancer with lumpectomy and radiation therapy  . Arthritis     Some in neck and back, but "can't complain"  . Normocytic anemia 03/21/2014   Past Surgical History  Procedure Laterality Date  . Mitral valve repair  June 2010  . Cataract extraction    . Kidney cyst removal    . Breast surgery      1998 Lumpectomy in right breast  . Back surgery      Synovial cyst on spine, removed laproscopically in 2000?  [name] Eye surgery      Cataract Surgery, both eyes  . Appendectomy    . Tonsillectomy    . Cesarean section      Three C-Sections  . Tee without cardioversion  04/29/2012    Procedure: TRANSESOPHAGEAL ECHOCARDIOGRAM (TEE);  Surgeon: [name], MD;  Location: [facility] ENDOSCOPY;  Service:  Cardiovascular;  Laterality: N/A;  . Cardioversion  04/29/2012    Procedure: CARDIOVERSION;  Surgeon: [name], MD;  Location: [facility] ENDOSCOPY;  Service: Cardiovascular;  Laterality: N/A;  . Cardioversion  06/19/2012    Procedure: CARDIOVERSION;  Surgeon: Sinclair Grooms, MD;  Location: [facility] ENDOSCOPY;  Service: Cardiovascular;  Laterality: N/A;  h/p from 11/8 in file drawer/dl   Family History  Problem Relation Age of Onset  . Other Sister    History  Substance Use Topics  . Smoking status: Never Smoker   . Smokeless tobacco: Never Used  . Alcohol Use: No   OB History    No data available     Review of Systems  Constitutional: Negative for fever.  Respiratory: Negative for shortness of breath.   Cardiovascular: Negative for chest pain.  Gastrointestinal: Negative for abdominal pain.  Neurological: Negative for dizziness, syncope, facial asymmetry, speech difficulty, weakness and headaches.  All other systems reviewed and are negative.     Allergies  Amiodarone; Amoxicillin; Amoxicillin-pot clavulanate; Aspirin; Ciprofloxacin; Codeine; Diuretic; Iohexol; Lisinopril; Midazolam hcl; Quinolones; Sotalol; Sulfa antibiotics; Thioxanthenes; Valium; Vancomycin; and Versed  Home Medications   Prior to Admission medications   Medication Sig Start Date End Date Taking? Authorizing Provider  apixaban (ELIQUIS) 2.5 MG TABS tablet Take 2.5 mg by mouth 2 (  two) times daily.   Yes Historical Provider, MD  Ascorbic Acid (VITAMIN C) 1000 MG tablet Take 1,000 mg by mouth daily.   Yes Historical Provider, MD  atenolol (TENORMIN) 25 MG tablet 1 tablet daily Patient taking differently: Take 25 mg by mouth daily. 1 tablet daily 01/19/14  Yes Sinclair Grooms, MD  CHIA SEED PO Take 5 mLs by mouth daily.    Yes Historical Provider, MD  cholecalciferol (VITAMIN D) 1000 UNITS tablet Take 1,000 Units by mouth daily.   Yes Historical Provider, MD  Coenzyme Q10 150 MG CAPS Take by mouth 2 times  daily at 12 noon and 4 pm.   Yes Historical Provider, MD  Cyanocobalamin (VITAMIN B-12) 1000 MCG SUBL Place 1,000 mcg under the tongue.   Yes Historical Provider, MD  docusate sodium (COLACE) 100 MG capsule Take 100 mg by mouth daily as needed for mild constipation.   Yes Historical Provider, MD  LORazepam (ATIVAN) 0.5 MG tablet Take 0.5 mg by mouth at bedtime as needed. For sleep   Yes Historical Provider, MD  OVER THE COUNTER MEDICATION Take 1 each by mouth daily. Hemp heart seeds   Yes Historical Provider, MD  OVER THE COUNTER MEDICATION Take 1 each by mouth daily. Greens plus supplement   Yes Historical Provider, MD  polyethylene glycol (MIRALAX / GLYCOLAX) packet Take 17 g by mouth 2 (two) times daily. 05/01/12  Yes Lezlie Octave Black, NP  polyvinyl alcohol (LIQUIFILM TEARS) 1.4 % ophthalmic solution Place 1 drop into both eyes as needed. For dry eyes   Yes Historical Provider, MD  Probiotic Product (PROBIOTIC DAILY PO) Take by mouth daily. 10 Billi CFU   Yes Historical Provider, MD  saccharomyces boulardii (FLORASTOR) 250 MG capsule Take 1 capsule (250 mg total) by mouth 2 (two) times daily. 03/21/14  Yes Janece Canterbury, MD  cephALEXin (KEFLEX) 500 MG capsule Take 1 capsule (500 mg total) by mouth 2 (two) times daily. Patient not taking: Reported on 06/02/2014 03/21/14   Janece Canterbury, MD  ELIQUIS 2.5 MG TABS tablet TAKE 1 TABLET TWICE DAILY. 04/01/14   Belva Crome III, MD  metroNIDAZOLE (FLAGYL) 500 MG tablet Take 1 tablet (500 mg total) by mouth every 8 (eight) hours. Patient not taking: Reported on 06/02/2014 03/21/14   Janece Canterbury, MD  Sodium Phosphates (ENEMA) 7-19 GM/118ML ENEM Place 1 enema rectally once.    Historical Provider, MD   BP 153/74 mmHg  Pulse 76  Temp(Src) 97.7 F (36.5 C) (Oral)  Resp 16  Ht 5\' 3"  (1.6 m)  Wt 125 lb (56.7 kg)  BMI 22.15 kg/m2  SpO2 98% Physical Exam  Constitutional: She is oriented to person, place, and time. She appears well-developed and  well-nourished. No distress.  HENT:  Head: Normocephalic and atraumatic.  Mouth/Throat: Oropharynx is clear and moist.  Eyes: Conjunctivae and EOM are normal. Pupils are equal, round, and reactive to light.  Neck: Normal range of motion. Neck supple.  Cardiovascular: Normal rate.  An irregularly irregular rhythm present.  Pulmonary/Chest: Effort normal and breath sounds normal. No respiratory distress. She has no wheezes. She has no rales.  Abdominal: Soft. Bowel sounds are normal. There is no tenderness. There is no rebound and no guarding.  Musculoskeletal: Normal range of motion. She exhibits edema.  Neurological: She is alert and oriented to person, place, and time. She has normal reflexes. She displays normal reflexes. No cranial nerve deficit. She exhibits normal muscle tone. Coordination normal.  Skin: Skin is  warm and dry.  Psychiatric: She has a normal mood and affect.    ED Course  Procedures (including critical care time) Labs Review Labs Reviewed  I-STAT CHEM 8, ED - Abnormal; Notable for the following:    Sodium 134 (*)    Glucose, Bld 113 (*)    Calcium, Ion 1.12 (*)    Hemoglobin 15.3 (*)    All other components within normal limits  CBC WITH DIFFERENTIAL  I-STAT TROPOININ, ED    Imaging Review No results found.   EKG Interpretation   Date/Time:  Wednesday June 02 2014 04:50:25 EST Ventricular Rate:  68 PR Interval:    QRS Duration: 90 QT Interval:  415 QTC Calculation: 441 R Axis:   -109 Text Interpretation:  Atrial fibrillation Confirmed by [facility]  MD,  Emmaline Kluver ([id number]) on 06/02/2014 5:33:38 AM      MDM   Final diagnoses:  Pain    610 case d/w Dr. Nicole Kindred of neurology suspect DDD of the cervical spine, especially following activity.  Follow up with PMD for ongoing testing and management.  Very unlikely TIA  Patient is anticipating discharge.      Carlisle Beers, MD 06/02/14 [phone]

## 2014-06-02 NOTE — ED Notes (Signed)
Back from CT

## 2014-08-03 ENCOUNTER — Ambulatory Visit (INDEPENDENT_AMBULATORY_CARE_PROVIDER_SITE_OTHER): Payer: Medicare Other | Admitting: Interventional Cardiology

## 2014-08-03 ENCOUNTER — Encounter: Payer: Self-pay | Admitting: Interventional Cardiology

## 2014-08-03 VITALS — BP 110/62 | HR 86 | Ht 63.0 in | Wt 134.4 lb

## 2014-08-03 DIAGNOSIS — Z7901 Long term (current) use of anticoagulants: Secondary | ICD-10-CM

## 2014-08-03 DIAGNOSIS — Z5181 Encounter for therapeutic drug level monitoring: Secondary | ICD-10-CM | POA: Diagnosis not present

## 2014-08-03 DIAGNOSIS — I482 Chronic atrial fibrillation, unspecified: Secondary | ICD-10-CM

## 2014-08-03 DIAGNOSIS — R0602 Shortness of breath: Secondary | ICD-10-CM

## 2014-08-03 DIAGNOSIS — Z9889 Other specified postprocedural states: Secondary | ICD-10-CM | POA: Diagnosis not present

## 2014-08-03 NOTE — Progress Notes (Signed)
Patient ID: [name], female   DOB: [birth year], 81 y.o.   MRN: [id number]    Cardiology Office Note   Date:  08/03/2014   ID:  [name], DOB 1926-02-25, MRN [id number]  PCP:  Tara Screws, MD  Cardiologist:  Tara Grooms, MD   Follow-up mitral valve; chronic atrial fibrillation    History of Present Illness: [name] is a 81 y.o. female who presents for follow-up of mitral valve disease and chronic atrial fibrillation. She complains of fatigue and exertional intolerance. For the past 4-6 weeks she has had a viral syndrome, urinary tract infection, and other maladies and it prevented her regular exercise routine. She is now going back to activity and notices that she is more short of breath and fatigued. She also feels palpitations. She has had no blood in the urine or stool. No transient neurological symptoms have occurred. She denies chest pain. Appetite is been stable. She denies chills and fever.    Past Medical History  Diagnosis Date  . Anxiety   . Atrial fibrillation     s/p MAZE 2010  . GERD (gastroesophageal reflux disease)   . Complication of anesthesia     Nausea from versed  . Atypical atrial flutter   . History of mitral valve repair     Mitral valve prolapse, surgery in 2010  . Rheumatic fever     age 44  . Cancer     1998 Right breast cancer with lumpectomy and radiation therapy  . Arthritis     Some in neck and back, but "can't complain"  . Normocytic anemia 03/21/2014    Past Surgical History  Procedure Laterality Date  . Mitral valve repair  June 2010  . Cataract extraction    . Kidney cyst removal    . Breast surgery      1998 Lumpectomy in right breast  . Back surgery      Synovial cyst on spine, removed laproscopically in 2000?  [name] Eye surgery      Cataract Surgery, both eyes  . Appendectomy    . Tonsillectomy    . Cesarean section      Three C-Sections  . Tee without cardioversion  04/29/2012    Procedure: TRANSESOPHAGEAL  ECHOCARDIOGRAM (TEE);  Surgeon: [name], MD;  Location: [facility] ENDOSCOPY;  Service: Cardiovascular;  Laterality: N/A;  . Cardioversion  04/29/2012    Procedure: CARDIOVERSION;  Surgeon: [name], MD;  Location: [facility] ENDOSCOPY;  Service: Cardiovascular;  Laterality: N/A;  . Cardioversion  06/19/2012    Procedure: CARDIOVERSION;  Surgeon: Tara Grooms, MD;  Location: [facility] ENDOSCOPY;  Service: Cardiovascular;  Laterality: N/A;  h/p from 11/8 in file drawer/dl     Current Outpatient Prescriptions  Medication Sig Dispense Refill  . apixaban (ELIQUIS) 2.5 MG TABS tablet Take 2.5 mg by mouth 2 (two) times daily.    . Ascorbic Acid (VITAMIN C) 1000 MG tablet Take 1,000 mg by mouth daily.    [name] atenolol (TENORMIN) 25 MG tablet 1 tablet daily (Patient taking differently: Take 25 mg by mouth daily. 1 tablet daily) 90 tablet 3  . cephALEXin (KEFLEX) 500 MG capsule Take 1 capsule (500 mg total) by mouth 2 (two) times daily. 10 capsule 0  . CHIA SEED PO Take 5 mLs by mouth daily.     . cholecalciferol (VITAMIN D) 1000 UNITS tablet Take 1,000 Units by mouth daily.    . Coenzyme Q10 150 MG CAPS Take  by mouth 2 times daily at 12 noon and 4 pm.    . Cyanocobalamin (VITAMIN B-12) 1000 MCG SUBL Place 1,000 mcg under the tongue.    . docusate sodium (COLACE) 100 MG capsule Take 100 mg by mouth daily as needed for mild constipation.    [name] LORazepam (ATIVAN) 0.5 MG tablet Take 0.5 mg by mouth at bedtime as needed. For sleep    . OVER THE COUNTER MEDICATION Take 1 each by mouth daily. Hemp heart seeds    . OVER THE COUNTER MEDICATION Take 1 each by mouth daily. Greens plus supplement    . polyethylene glycol (MIRALAX / GLYCOLAX) packet Take 17 g by mouth 2 (two) times daily. (Patient taking differently: Take 17 g by mouth daily as needed. ) 14 each   . polyvinyl alcohol (LIQUIFILM TEARS) 1.4 % ophthalmic solution Place 1 drop into both eyes as needed. For dry eyes    . Probiotic Product (PROBIOTIC DAILY PO)  Take by mouth daily. 10 Billi CFU    . saccharomyces boulardii (FLORASTOR) 250 MG capsule Take 1 capsule (250 mg total) by mouth 2 (two) times daily. 60 capsule 0  . Sodium Phosphates (ENEMA) 7-19 GM/118ML ENEM Place 1 enema rectally once.     No current facility-administered medications for this visit.    Allergies:   Amiodarone; Amoxicillin; Amoxicillin-pot clavulanate; Aspirin; Ciprofloxacin; Codeine; Diuretic; Iohexol; Lisinopril; Midazolam hcl; Quinolones; Sotalol; Sulfa antibiotics; Thioxanthenes; Valium; Vancomycin; and Versed    Social History:  The patient  reports that she has never smoked. She has never used smokeless tobacco. She reports that she does not drink alcohol or use illicit drugs.   Family History:  The patient's is noncontributory. There is no history of vascular   ROS:  Please see the history of present illness.   Otherwise, review of systems are positive for fatigue and exertional intolerance..   All other systems are reviewed and negative.    PHYSICAL EXAM: VS:  BP 110/62 mmHg  Pulse 86  Ht 5\' 3"  (1.6 m)  Wt 134 lb 6.4 oz (60.963 kg)  BMI 23.81 kg/m2  SpO2 97% , BMI Body mass index is 23.81 kg/(m^2). GEN: Well nourished, well developed, in no acute distress HEENT: normal Neck: no JVD, carotid bruits, or masses Cardiac: IIRR; no murmurs, rubs, or gallops,no edema  Respiratory:  clear to auscultation bilaterally, normal work of breathing GI: soft, nontender, nondistended, + BS MS: no deformity or atrophy Skin: warm and dry, no rash Neuro:  Strength and sensation are intact Psych: euthymic mood, full affect   EKG:  EKG is not ordered today.   Recent Labs: 03/19/2014: ALT 28 06/02/2014: BUN 14; Creatinine 0.60; Hemoglobin 15.3*; Platelets 164; Potassium 4.2; Sodium 134*    Lipid Panel No results found for: CHOL, TRIG, HDL, CHOLHDL, VLDL, LDLCALC, LDLDIRECT    Wt Readings from Last 3 Encounters:  08/03/14 134 lb 6.4 oz (60.963 kg)  06/02/14 125 lb  (56.7 kg)  03/19/14 130 lb (58.968 kg)      Other studies Reviewed: Additional studies/ records that were reviewed today include: None   ASSESSMENT AND PLAN:  1. Status post mitral valve repair without clinical recurrence of regurgitation  2. Chronic anticoagulation therapy without complications. She is currently on apixaban  3. Chronic atrial fibrillation with excellent rate control. No change in therapy is needed. She is to continue Tenormin for rate control.  4. Exertional intolerance, which I believes is related to deconditioning. I have encouraged that she increase  aerobic activity gradually.    Current medicines are reviewed at length with the patient today.  The patient does not have concerns regarding medicines.  The following changes have been made:  no change  Labs/ tests ordered today include: No orders of the defined types were placed in this encounter.     Disposition:   Maricela Curet, M.D.in 1 year    Signed, Tara Grooms, MD  08/03/2014 4:13 PM    [facility] [address], [place], [place]  [address] Phone: [phone]; Fax: [phone]

## 2014-08-03 NOTE — Patient Instructions (Signed)
Your physician recommends that you continue on your current medications as directed. Please refer to the Current Medication list given to you today.  Try to stay active  Your physician wants you to follow-up in: 1 year with Dr.Smith You will receive a reminder letter in the mail two months in advance. If you don't receive a letter, please call our office to schedule the follow-up appointment.

## 2014-08-30 DIAGNOSIS — R300 Dysuria: Secondary | ICD-10-CM | POA: Diagnosis not present

## 2014-09-06 ENCOUNTER — Other Ambulatory Visit: Payer: Self-pay

## 2014-09-06 DIAGNOSIS — Z1231 Encounter for screening mammogram for malignant neoplasm of breast: Secondary | ICD-10-CM

## 2014-09-20 ENCOUNTER — Ambulatory Visit
Admission: RE | Admit: 2014-09-20 | Discharge: 2014-09-20 | Disposition: A | Discharge status: Home / Self Care | Payer: Medicare Other | Source: Ambulatory Visit

## 2014-09-20 DIAGNOSIS — Z1231 Encounter for screening mammogram for malignant neoplasm of breast: Secondary | ICD-10-CM

## 2014-10-12 ENCOUNTER — Other Ambulatory Visit: Payer: Self-pay | Admitting: Interventional Cardiology

## 2014-10-12 ENCOUNTER — Other Ambulatory Visit: Payer: Self-pay

## 2014-10-12 MED ORDER — APIXABAN 2.5 MG PO TABS: 2.5000 mg | ORAL_TABLET | Freq: Two times a day (BID) | ORAL | Status: DC

## 2014-10-18 DIAGNOSIS — H6123 Impacted cerumen, bilateral: Secondary | ICD-10-CM | POA: Diagnosis not present

## 2014-10-26 DIAGNOSIS — R300 Dysuria: Secondary | ICD-10-CM | POA: Diagnosis not present

## 2014-11-04 DIAGNOSIS — H3531 Nonexudative age-related macular degeneration: Secondary | ICD-10-CM | POA: Diagnosis not present

## 2014-11-04 DIAGNOSIS — Z961 Presence of intraocular lens: Secondary | ICD-10-CM | POA: Diagnosis not present

## 2014-11-04 DIAGNOSIS — H3530 Unspecified macular degeneration: Secondary | ICD-10-CM | POA: Diagnosis not present

## 2014-11-04 DIAGNOSIS — H04129 Dry eye syndrome of unspecified lacrimal gland: Secondary | ICD-10-CM | POA: Diagnosis not present

## 2014-11-05 DIAGNOSIS — L570 Actinic keratosis: Secondary | ICD-10-CM | POA: Diagnosis not present

## 2014-11-08 DIAGNOSIS — R300 Dysuria: Secondary | ICD-10-CM | POA: Diagnosis not present

## 2014-11-22 DIAGNOSIS — N390 Urinary tract infection, site not specified: Secondary | ICD-10-CM | POA: Diagnosis not present

## 2014-11-24 DIAGNOSIS — N3020 Other chronic cystitis without hematuria: Secondary | ICD-10-CM | POA: Diagnosis not present

## 2014-11-24 DIAGNOSIS — N3946 Mixed incontinence: Secondary | ICD-10-CM | POA: Diagnosis not present

## 2014-11-24 DIAGNOSIS — R300 Dysuria: Secondary | ICD-10-CM | POA: Diagnosis not present

## 2014-12-03 DIAGNOSIS — N3020 Other chronic cystitis without hematuria: Secondary | ICD-10-CM | POA: Diagnosis not present

## 2014-12-03 DIAGNOSIS — N390 Urinary tract infection, site not specified: Secondary | ICD-10-CM | POA: Diagnosis not present

## 2015-01-17 ENCOUNTER — Other Ambulatory Visit: Payer: Self-pay | Admitting: Interventional Cardiology

## 2015-01-17 ENCOUNTER — Other Ambulatory Visit: Payer: Self-pay

## 2015-01-17 DIAGNOSIS — H6121 Impacted cerumen, right ear: Secondary | ICD-10-CM | POA: Diagnosis not present

## 2015-01-17 MED ORDER — ATENOLOL 25 MG PO TABS: ORAL_TABLET | ORAL | Status: DC

## 2015-02-02 DIAGNOSIS — F419 Anxiety disorder, unspecified: Secondary | ICD-10-CM | POA: Diagnosis not present

## 2015-02-02 DIAGNOSIS — I509 Heart failure, unspecified: Secondary | ICD-10-CM | POA: Diagnosis not present

## 2015-02-02 DIAGNOSIS — E782 Mixed hyperlipidemia: Secondary | ICD-10-CM | POA: Diagnosis not present

## 2015-02-02 DIAGNOSIS — Z0001 Encounter for general adult medical examination with abnormal findings: Secondary | ICD-10-CM | POA: Diagnosis not present

## 2015-02-02 DIAGNOSIS — Z79899 Other long term (current) drug therapy: Secondary | ICD-10-CM | POA: Diagnosis not present

## 2015-02-02 DIAGNOSIS — Z1389 Encounter for screening for other disorder: Secondary | ICD-10-CM | POA: Diagnosis not present

## 2015-02-02 DIAGNOSIS — C50919 Malignant neoplasm of unspecified site of unspecified female breast: Secondary | ICD-10-CM | POA: Diagnosis not present

## 2015-02-02 DIAGNOSIS — Z7901 Long term (current) use of anticoagulants: Secondary | ICD-10-CM | POA: Diagnosis not present

## 2015-02-02 DIAGNOSIS — D500 Iron deficiency anemia secondary to blood loss (chronic): Secondary | ICD-10-CM | POA: Diagnosis not present

## 2015-02-02 DIAGNOSIS — I10 Essential (primary) hypertension: Secondary | ICD-10-CM | POA: Diagnosis not present

## 2015-02-02 DIAGNOSIS — I4891 Unspecified atrial fibrillation: Secondary | ICD-10-CM | POA: Diagnosis not present

## 2015-03-21 DIAGNOSIS — N3010 Interstitial cystitis (chronic) without hematuria: Secondary | ICD-10-CM | POA: Diagnosis not present

## 2015-03-21 DIAGNOSIS — N3289 Other specified disorders of bladder: Secondary | ICD-10-CM | POA: Diagnosis not present

## 2015-05-19 DIAGNOSIS — I8391 Asymptomatic varicose veins of right lower extremity: Secondary | ICD-10-CM | POA: Diagnosis not present

## 2015-05-19 DIAGNOSIS — D1801 Hemangioma of skin and subcutaneous tissue: Secondary | ICD-10-CM | POA: Diagnosis not present

## 2015-05-19 DIAGNOSIS — L814 Other melanin hyperpigmentation: Secondary | ICD-10-CM | POA: Diagnosis not present

## 2015-05-19 DIAGNOSIS — L821 Other seborrheic keratosis: Secondary | ICD-10-CM | POA: Diagnosis not present

## 2015-08-05 ENCOUNTER — Ambulatory Visit (INDEPENDENT_AMBULATORY_CARE_PROVIDER_SITE_OTHER): Payer: Medicare Other | Admitting: Interventional Cardiology

## 2015-08-05 ENCOUNTER — Encounter: Payer: Self-pay | Admitting: Interventional Cardiology

## 2015-08-05 VITALS — BP 114/66 | HR 102 | Ht 64.0 in | Wt 136.0 lb

## 2015-08-05 DIAGNOSIS — Z9889 Other specified postprocedural states: Secondary | ICD-10-CM | POA: Diagnosis not present

## 2015-08-05 DIAGNOSIS — I482 Chronic atrial fibrillation, unspecified: Secondary | ICD-10-CM

## 2015-08-05 DIAGNOSIS — Z7901 Long term (current) use of anticoagulants: Secondary | ICD-10-CM

## 2015-08-05 DIAGNOSIS — Z5181 Encounter for therapeutic drug level monitoring: Secondary | ICD-10-CM

## 2015-08-05 NOTE — Progress Notes (Signed)
Cardiology Office Note   Date:  08/05/2015   ID:  [name], DOB 1925-07-24, MRN [id number]  PCP:  Henrine Screws, MD  Cardiologist:  Sinclair Grooms, MD   Chief Complaint  Patient presents with  . Atrial Fibrillation      History of Present Illness: [name] is a 82 y.o. female who presents for mitral valve repair, persistent atrial fibrillation, diastolic heart failure, and chronic anticoagulation therapy.  She discontinued her beta blocker after she read it can cause problems. She stopped taking the medication is felt so much better. She denies orthopnea, dyspnea, exertional intolerance, and syncope. She is very active including water aerobics multiple times per week.  She has had no bleeding on anticoagulation and denies neurological events.  Past Medical History  Diagnosis Date  . Anxiety   . Atrial fibrillation ([place])     s/p MAZE 2010  . GERD (gastroesophageal reflux disease)   . Complication of anesthesia     Nausea from versed  . Atypical atrial flutter ([place])   . History of mitral valve repair     Mitral valve prolapse, surgery in 2010  . Rheumatic fever     age 5  . Cancer [facility])     1998 Right breast cancer with lumpectomy and radiation therapy  . Arthritis     Some in neck and back, but "can't complain"  . Normocytic anemia 03/21/2014    Past Surgical History  Procedure Laterality Date  . Mitral valve repair  June 2010  . Cataract extraction    . Kidney cyst removal    . Breast surgery      1998 Lumpectomy in right breast  . Back surgery      Synovial cyst on spine, removed laproscopically in 2000?  [name] Eye surgery      Cataract Surgery, both eyes  . Appendectomy    . Tonsillectomy    . Cesarean section      Three C-Sections  . Tee without cardioversion  04/29/2012    Procedure: TRANSESOPHAGEAL ECHOCARDIOGRAM (TEE);  Surgeon: [name], MD;  Location: [facility] ENDOSCOPY;  Service: Cardiovascular;  Laterality: N/A;  . Cardioversion   04/29/2012    Procedure: CARDIOVERSION;  Surgeon: [name], MD;  Location: [facility] ENDOSCOPY;  Service: Cardiovascular;  Laterality: N/A;  . Cardioversion  06/19/2012    Procedure: CARDIOVERSION;  Surgeon: Sinclair Grooms, MD;  Location: [facility] ENDOSCOPY;  Service: Cardiovascular;  Laterality: N/A;  h/p from 11/8 in file drawer/dl     Current Outpatient Prescriptions  Medication Sig Dispense Refill  . apixaban (ELIQUIS) 2.5 MG TABS tablet Take 1 tablet (2.5 mg total) by mouth 2 (two) times daily. 60 tablet 11  . Ascorbic Acid (VITAMIN C) 1000 MG tablet Take 1,000 mg by mouth daily.    [name] CHIA SEED PO Take 5 mLs by mouth daily.     . cholecalciferol (VITAMIN D) 1000 UNITS tablet Take 1,000 Units by mouth daily.    . Coenzyme Q10 150 MG CAPS Take by mouth 2 times daily at 12 noon and 4 pm.    . Cyanocobalamin (VITAMIN B-12) 1000 MCG SUBL Place 1,000 mcg under the tongue.    [name] OVER THE COUNTER MEDICATION Take 1 each by mouth daily. Hemp heart seeds    . polyethylene glycol (MIRALAX / GLYCOLAX) packet Take 17 g by mouth daily as needed for mild constipation.    . polyvinyl alcohol (LIQUIFILM TEARS) 1.4 % ophthalmic solution  Place 1 drop into both eyes as needed. For dry eyes    . Probiotic Product (PROBIOTIC DAILY PO) Take by mouth daily. 10 Billi CFU     No current facility-administered medications for this visit.    Allergies:   Amiodarone; Amoxicillin; Amoxicillin-pot clavulanate; Aspirin; Ciprofloxacin; Codeine; Diuretic; Iohexol; Lisinopril; Midazolam hcl; Quinolones; Sotalol; Sulfa antibiotics; Thioxanthenes; Valium; Vancomycin; and Versed    Social History:  The patient  reports that she has never smoked. She has never used smokeless tobacco. She reports that she does not drink alcohol or use illicit drugs.   Family History:  The patient's family history includes Other in her sister.    ROS:  Please see the history of present illness.   Otherwise, review of systems are positive for  palpitations, easy bruising, otherwise unremarkable.   All other systems are reviewed and negative.    PHYSICAL EXAM: VS:  BP 114/66 mmHg  Pulse 102  Ht 5\' 4"  (1.626 m)  Wt 136 lb (61.689 kg)  BMI 23.33 kg/m2 , BMI Body mass index is 23.33 kg/(m^2). GEN: Well nourished, well developed, in no acute distress HEENT: normal Neck: no JVD, carotid bruits, or masses Cardiac: IIRR.  There is no murmur, rub, or gallop. There is no edema. Respiratory:  clear to auscultation bilaterally, normal work of breathing. GI: soft, nontender, nondistended, + BS MS: no deformity or atrophy Skin: warm and dry, no rash Neuro:  Strength and sensation are intact Psych: euthymic mood, full affect   EKG:  EKG is ordered today. The ekg reveals atrial flutter with poor ventricular rate control, resting ventricular response 102 bpm.   Recent Labs: No results found for requested labs within last 365 days.    Lipid Panel No results found for: CHOL, TRIG, HDL, CHOLHDL, VLDL, LDLCALC, LDLDIRECT    Wt Readings from Last 3 Encounters:  08/05/15 136 lb (61.689 kg)  08/03/14 134 lb 6.4 oz (60.963 kg)  06/02/14 125 lb (56.7 kg)      Other studies Reviewed: Additional studies/ records that were reviewed today include: Electronic health record encounters were reviewed. No significant new encounters or problems have been identified..    ASSESSMENT AND PLAN:  1. Chronic atrial fibrillation (HCC) She is actually in atrial flutter with poor ventricular rate control after discontinuing atenolol without medical advice. - EKG 12-Lead - Holter monitor - 24 hour; Future  2. H/O mitral valve repair No mitral regurgitation or evidence of valve dysfunction - EKG 12-Lead - Holter monitor - 24 hour; Future  3. Anticoagulation goal of INR 2 to 3 No significant bleeding issues - EKG 12-Lead - Holter monitor - 24 hour; Future    Current medicines are reviewed at length with the patient today.  The patient has  the following concerns regarding medicines: We discussed the potential for rate related left ventricular dysfunction due to discontinuation of beta blocker therapy..  The following changes/actions have been instituted:    24-hour Holter  Determination about rate control based upon this study  Blood pressure is relatively low and medications that lower blood pressure would probably not be appropriate for rate control, therefore digoxin would be our medication of choice.  Labs/ tests ordered today include:  No orders of the defined types were placed in this encounter.     Disposition:   FU with HS in 1 year  Signed, Sinclair Grooms, MD  08/05/2015 8:39 AM    [place], [place]  [address] Phone: [phone]; Fax: [phone]

## 2015-08-05 NOTE — Patient Instructions (Addendum)
Your physician recommends that you continue on your current medications as directed. Please refer to the Current Medication list given to you today.  Your physician has recommended that you wear a holter monitor. Holter monitors are medical devices that record the heart's electrical activity. Doctors most often use these monitors to diagnose arrhythmias. Arrhythmias are problems with the speed or rhythm of the heartbeat. The monitor is a small, portable device. You can wear one while you do your normal daily activities. This is usually used to diagnose what is causing palpitations/syncope (passing out). (SCHEDULE ON A DAY WHEN YOU CAN PERFORM YOUR NORMAL ACTIVITIES--OTHER THAN THE POOL)--SOMETIME WITHIN THE NEXT MONTH.   Your physician wants you to follow-up in: [place]. You will receive a reminder letter in the mail two months in advance. If you don't receive a letter, please call our office to schedule the follow-up appointment.

## 2015-08-12 ENCOUNTER — Ambulatory Visit (INDEPENDENT_AMBULATORY_CARE_PROVIDER_SITE_OTHER): Payer: Medicare Other

## 2015-08-12 DIAGNOSIS — Z9889 Other specified postprocedural states: Secondary | ICD-10-CM | POA: Diagnosis not present

## 2015-08-12 DIAGNOSIS — Z7901 Long term (current) use of anticoagulants: Secondary | ICD-10-CM

## 2015-08-12 DIAGNOSIS — Z5181 Encounter for therapeutic drug level monitoring: Secondary | ICD-10-CM | POA: Diagnosis not present

## 2015-08-12 DIAGNOSIS — I482 Chronic atrial fibrillation, unspecified: Secondary | ICD-10-CM

## 2015-08-17 ENCOUNTER — Other Ambulatory Visit: Payer: Self-pay | Admitting: Interventional Cardiology

## 2015-08-18 ENCOUNTER — Telehealth: Payer: Self-pay | Admitting: Interventional Cardiology

## 2015-08-18 NOTE — Telephone Encounter (Signed)
New message   Patient calling     *STAT* If patient is at the pharmacy, call can be transferred to refill team.   1. Which medications need to be refilled? (please list name of each medication and dose if known) atenolol  25 mg   2. Which pharmacy/location (including street and city if local pharmacy) is medication to be sent to? [place]  [phone]   3. Do they need a 30 day or 90 day supply? 90 days supply

## 2015-08-23 NOTE — Progress Notes (Signed)
Metoprolol succinate 25 mg daily

## 2015-08-25 NOTE — Telephone Encounter (Signed)
[name] is returning your call

## 2015-08-25 NOTE — Telephone Encounter (Signed)
Pt aware of Dr.Smith's recommendation to start Metoprolol 25mg  qd for rate control. Pt sts that she does not want to be on Metoprolol. She would like to resume Atenolol, it was the pt preference stop. Pt also rqst a refill for Atenolol sent to her pharmacy. Adv pt that I will need to fwd a message to [place] for his approval. We will send in a new Rx at that time. Pt verbalized understanding. Pt rqst Rx be sent to [facility].

## 2015-08-26 NOTE — Telephone Encounter (Signed)
Okay, Atenolol 25 mg daily

## 2015-08-29 ENCOUNTER — Telehealth: Payer: Self-pay

## 2015-08-29 MED ORDER — ATENOLOL 25 MG PO TABS: 25.0000 mg | ORAL_TABLET | Freq: Every day | ORAL | Status: DC

## 2015-08-29 NOTE — Telephone Encounter (Signed)
Atenolol 25mg  qd #90 R-3 sent to [facility]

## 2015-08-29 NOTE — Telephone Encounter (Signed)
Pt aware Rx has been sent to pt pharmacy

## 2015-09-15 DIAGNOSIS — H6121 Impacted cerumen, right ear: Secondary | ICD-10-CM | POA: Diagnosis not present

## 2015-09-19 ENCOUNTER — Other Ambulatory Visit: Payer: Self-pay

## 2015-09-19 DIAGNOSIS — Z1231 Encounter for screening mammogram for malignant neoplasm of breast: Secondary | ICD-10-CM

## 2015-09-21 DIAGNOSIS — M4316 Spondylolisthesis, lumbar region: Secondary | ICD-10-CM | POA: Diagnosis not present

## 2015-09-21 DIAGNOSIS — M5136 Other intervertebral disc degeneration, lumbar region: Secondary | ICD-10-CM | POA: Diagnosis not present

## 2015-09-26 ENCOUNTER — Ambulatory Visit: Payer: Medicare Other

## 2015-10-14 ENCOUNTER — Ambulatory Visit: Payer: Medicare Other

## 2015-10-28 ENCOUNTER — Ambulatory Visit
Admission: RE | Admit: 2015-10-28 | Discharge: 2015-10-28 | Disposition: A | Discharge status: Home / Self Care | Payer: Medicare Other | Source: Ambulatory Visit

## 2015-10-28 DIAGNOSIS — Z1231 Encounter for screening mammogram for malignant neoplasm of breast: Secondary | ICD-10-CM | POA: Diagnosis not present

## 2015-11-03 DIAGNOSIS — D485 Neoplasm of uncertain behavior of skin: Secondary | ICD-10-CM | POA: Diagnosis not present

## 2015-11-03 DIAGNOSIS — L308 Other specified dermatitis: Secondary | ICD-10-CM | POA: Diagnosis not present

## 2015-11-03 DIAGNOSIS — L570 Actinic keratosis: Secondary | ICD-10-CM | POA: Diagnosis not present

## 2015-11-04 ENCOUNTER — Telehealth: Payer: Self-pay | Admitting: Interventional Cardiology

## 2015-11-04 DIAGNOSIS — H52223 Regular astigmatism, bilateral: Secondary | ICD-10-CM | POA: Diagnosis not present

## 2015-11-04 DIAGNOSIS — H524 Presbyopia: Secondary | ICD-10-CM | POA: Diagnosis not present

## 2015-11-04 DIAGNOSIS — H5203 Hypermetropia, bilateral: Secondary | ICD-10-CM | POA: Diagnosis not present

## 2015-11-04 DIAGNOSIS — H401111 Primary open-angle glaucoma, right eye, mild stage: Secondary | ICD-10-CM | POA: Diagnosis not present

## 2015-11-04 NOTE — Telephone Encounter (Signed)
New Message:  Amber called in wanting to know if they can give the pt a beta blocker drop because she has Glaucoma and High BP. Please f/u with her.

## 2015-11-04 NOTE — Telephone Encounter (Signed)
Reviewed with Dr. Tamala Julian -- order: ok to begin BB gtts. Informed Dr. Susanne Greenhouse office ok to begin.

## 2015-11-10 ENCOUNTER — Other Ambulatory Visit: Payer: Self-pay | Admitting: Interventional Cardiology

## 2015-11-11 ENCOUNTER — Other Ambulatory Visit: Payer: Self-pay | Admitting: *Deleted

## 2015-11-11 MED ORDER — APIXABAN 2.5 MG PO TABS: 2.5000 mg | ORAL_TABLET | Freq: Two times a day (BID) | ORAL | Status: DC

## 2015-12-20 DIAGNOSIS — H401121 Primary open-angle glaucoma, left eye, mild stage: Secondary | ICD-10-CM | POA: Diagnosis not present

## 2015-12-29 ENCOUNTER — Ambulatory Visit: Payer: Medicare Other | Admitting: Cardiology

## 2015-12-29 ENCOUNTER — Other Ambulatory Visit: Payer: Self-pay

## 2015-12-29 ENCOUNTER — Encounter (HOSPITAL_COMMUNITY): Payer: Self-pay

## 2015-12-29 ENCOUNTER — Emergency Department (HOSPITAL_COMMUNITY)
Admission: EM | Admit: 2015-12-29 | Discharge: 2015-12-29 | Disposition: A | Discharge status: Home / Self Care | Payer: Medicare Other | Attending: Emergency Medicine | Admitting: Emergency Medicine

## 2015-12-29 ENCOUNTER — Telehealth: Payer: Self-pay

## 2015-12-29 ENCOUNTER — Emergency Department (HOSPITAL_COMMUNITY): Payer: Medicare Other

## 2015-12-29 DIAGNOSIS — R404 Transient alteration of awareness: Secondary | ICD-10-CM | POA: Diagnosis not present

## 2015-12-29 DIAGNOSIS — R531 Weakness: Secondary | ICD-10-CM | POA: Diagnosis not present

## 2015-12-29 DIAGNOSIS — Z7901 Long term (current) use of anticoagulants: Secondary | ICD-10-CM | POA: Insufficient documentation

## 2015-12-29 DIAGNOSIS — R61 Generalized hyperhidrosis: Secondary | ICD-10-CM | POA: Diagnosis not present

## 2015-12-29 DIAGNOSIS — Z79899 Other long term (current) drug therapy: Secondary | ICD-10-CM | POA: Insufficient documentation

## 2015-12-29 DIAGNOSIS — R55 Syncope and collapse: Secondary | ICD-10-CM | POA: Insufficient documentation

## 2015-12-29 DIAGNOSIS — N390 Urinary tract infection, site not specified: Secondary | ICD-10-CM | POA: Insufficient documentation

## 2015-12-29 DIAGNOSIS — Z859 Personal history of malignant neoplasm, unspecified: Secondary | ICD-10-CM | POA: Diagnosis not present

## 2015-12-29 HISTORY — DX: Unspecified glaucoma: H40.9

## 2015-12-29 LAB — URINALYSIS, ROUTINE W REFLEX MICROSCOPIC
Bilirubin Urine: NEGATIVE
Glucose, UA: NEGATIVE mg/dL
Ketones, ur: NEGATIVE mg/dL
Nitrite: POSITIVE — AB
Protein, ur: NEGATIVE mg/dL
Specific Gravity, Urine: 1.015 (ref 1.005–1.030)
pH: 6 (ref 5.0–8.0)

## 2015-12-29 LAB — COMPREHENSIVE METABOLIC PANEL
ALT: 36 U/L (ref 14–54)
AST: 31 U/L (ref 15–41)
Albumin: 3.8 g/dL (ref 3.5–5.0)
Alkaline Phosphatase: 51 U/L (ref 38–126)
Anion gap: 6 (ref 5–15)
BUN: 16 mg/dL (ref 6–20)
CO2: 28 mmol/L (ref 22–32)
Calcium: 9.3 mg/dL (ref 8.9–10.3)
Chloride: 99 mmol/L — ABNORMAL LOW (ref 101–111)
Creatinine, Ser: 0.66 mg/dL (ref 0.44–1.00)
GFR calc Af Amer: >60 mL/min (ref >60)
GFR calc non Af Amer: >60 mL/min (ref >60)
Glucose, Bld: 110 mg/dL — ABNORMAL HIGH (ref 65–99)
Potassium: 4.2 mmol/L (ref 3.5–5.1)
Sodium: 133 mmol/L — ABNORMAL LOW (ref 135–145)
Total Bilirubin: 1 mg/dL (ref 0.3–1.2)
Total Protein: 6.9 g/dL (ref 6.5–8.1)

## 2015-12-29 LAB — CBC WITH DIFFERENTIAL/PLATELET
Basophils Absolute: 0 10*3/uL (ref 0.0–0.1)
Basophils Relative: 0 %
Eosinophils Absolute: 0 10*3/uL (ref 0.0–0.7)
Eosinophils Relative: 0 %
HCT: 39 % (ref 36.0–46.0)
Hemoglobin: 12.7 g/dL (ref 12.0–15.0)
Lymphocytes Relative: 16 %
Lymphs Abs: 1.2 10*3/uL (ref 0.7–4.0)
MCH: 33 pg (ref 26.0–34.0)
MCHC: 32.6 g/dL (ref 30.0–36.0)
MCV: 101.3 fL — ABNORMAL HIGH (ref 78.0–100.0)
Monocytes Absolute: 0.4 10*3/uL (ref 0.1–1.0)
Monocytes Relative: 5 %
Neutro Abs: 5.8 10*3/uL (ref 1.7–7.7)
Neutrophils Relative %: 79 %
Platelets: 130 10*3/uL — ABNORMAL LOW (ref 150–400)
RBC: 3.85 MIL/uL — ABNORMAL LOW (ref 3.87–5.11)
RDW: 12.9 % (ref 11.5–15.5)
WBC: 7.4 10*3/uL (ref 4.0–10.5)

## 2015-12-29 LAB — URINE MICROSCOPIC-ADD ON

## 2015-12-29 LAB — I-STAT TROPONIN, ED: Troponin i, poc: 0.01 ng/mL (ref 0.00–0.08)

## 2015-12-29 MED ORDER — FOSFOMYCIN TROMETHAMINE 3 G PO PACK
3.0000 g | PACK | Freq: Once | ORAL | Status: AC
  Administered 2015-12-29: 3 g via ORAL
  Filled 2015-12-29: qty 3

## 2015-12-29 MED ORDER — CEFTRIAXONE SODIUM 1 G IJ SOLR
1.0000 g | Freq: Once | INTRAMUSCULAR | Status: AC
  Administered 2015-12-29: 1 g via INTRAVENOUS
  Filled 2015-12-29: qty 10

## 2015-12-29 MED ORDER — SODIUM CHLORIDE 0.9 % IV BOLUS (SEPSIS)
1000.0000 mL | Freq: Once | INTRAVENOUS | Status: AC
  Administered 2015-12-29: 1000 mL via INTRAVENOUS

## 2015-12-29 NOTE — Telephone Encounter (Signed)
Called to follow-up on patient.  [name] states the paramedics came and evaluated her. She states they are taking her to the hospital because "her vital signs are concerning and it might be afib." She was grateful for call.

## 2015-12-29 NOTE — ED Notes (Signed)
Pt. Brought in by [facility] complaining of weakness. Pt. Reports near syncope around 9:30 this morning when she stood up from sitting. Neg for stroke screen and orthostatic vitals. Pt. A/O x4 and vitals normal. History of A-fib and CBG 127

## 2015-12-29 NOTE — ED Provider Notes (Signed)
CSN: [id number]     Arrival date & time 12/29/15  1102 History   First MD Initiated Contact with Patient 12/29/15 1114     Chief Complaint  Patient presents with  . Weakness     (Consider location/radiation/quality/duration/timing/severity/associated sxs/prior Treatment) HPI [name] is a 82 y.o. female who presents to emergency department with complaint of sudden onset of dizziness, nausea, diaphoresis. Patient states she was sitting down talking on the phone when she started having symptoms. She stood up, however symptoms got worse upon standing and she ended up falling onto the couch. She denies losing consciousness. She denies any injuries. She does report palpitations during this episode. She states her symptoms lasted several minutes, then improved. History of A. fib, on Eliquis. Also reports history of SVT. States she is going through a lot of stress at this time, she has been very active last few days has been moving into her new house. Denies any chest pain, denies any shortness of breath during this episode. She is currently symptom-free. Denies cough or congestion. No swelling in her extremities. No tx prior to coming in.    Past Medical History  Diagnosis Date  . Anxiety   . Atrial fibrillation ([place])     s/p MAZE 2010  . GERD (gastroesophageal reflux disease)   . Complication of anesthesia     Nausea from versed  . Atypical atrial flutter ([place])   . History of mitral valve repair     Mitral valve prolapse, surgery in 2010  . Rheumatic fever     age 18  . Cancer [facility])     1998 Right breast cancer with lumpectomy and radiation therapy  . Arthritis     Some in neck and back, but "can't complain"  . Normocytic anemia 03/21/2014  . Glaucoma    Past Surgical History  Procedure Laterality Date  . Mitral valve repair  June 2010  . Cataract extraction    . Kidney cyst removal    . Breast surgery      1998 Lumpectomy in right breast  . Back surgery      Synovial cyst on  spine, removed laproscopically in 2000?  [name] surgery      Cataract Surgery, both eyes  . Appendectomy    . Tonsillectomy    . Cesarean section      Three C-Sections  . Tee without cardioversion  04/29/2012    Procedure: TRANSESOPHAGEAL ECHOCARDIOGRAM (TEE);  Surgeon: [name], MD;  Location: [facility] ENDOSCOPY;  Service: Cardiovascular;  Laterality: N/A;  . Cardioversion  04/29/2012    Procedure: CARDIOVERSION;  Surgeon: [name], MD;  Location: [facility] ENDOSCOPY;  Service: Cardiovascular;  Laterality: N/A;  . Cardioversion  06/19/2012    Procedure: CARDIOVERSION;  Surgeon: Sinclair Grooms, MD;  Location: [facility] ENDOSCOPY;  Service: Cardiovascular;  Laterality: N/A;  h/p from 11/8 in file drawer/dl   Family History  Problem Relation Age of Onset  . Other Sister    Social History  Substance Use Topics  . Smoking status: Never Smoker   . Smokeless tobacco: Never Used  . Alcohol Use: No   OB History    No data available     Review of Systems  Constitutional: Negative for fever and chills.  Respiratory: Negative for cough, chest tightness and shortness of breath.   Cardiovascular: Negative for chest pain, palpitations and leg swelling.  Gastrointestinal: Negative for nausea, vomiting, abdominal pain and diarrhea.  Genitourinary: Negative for dysuria,  flank pain, vaginal bleeding, vaginal discharge, vaginal pain and pelvic pain.  Musculoskeletal: Negative for myalgias, arthralgias, neck pain and neck stiffness.  Skin: Negative for rash.  Neurological: Negative for dizziness, weakness and headaches.  All other systems reviewed and are negative.     Allergies  Amiodarone; Amoxicillin; Amoxicillin-pot clavulanate; Aspirin; Ciprofloxacin; Codeine; Diuretic; Iohexol; Lisinopril; Midazolam hcl; Quinolones; Sotalol; Sulfa antibiotics; Thioxanthenes; Valium; Vancomycin; and Versed  Home Medications   Prior to Admission medications   Medication Sig Start Date End Date Taking?  Authorizing Provider  apixaban (ELIQUIS) 2.5 MG TABS tablet Take 1 tablet (2.5 mg total) by mouth 2 (two) times daily. 11/11/15   Belva Crome, MD  Ascorbic Acid (VITAMIN C) 1000 MG tablet Take 1,000 mg by mouth daily.    Historical Provider, MD  atenolol (TENORMIN) 25 MG tablet Take 1 tablet (25 mg total) by mouth daily. 08/29/15   Belva Crome, MD  CHIA SEED PO Take 5 mLs by mouth daily.     Historical Provider, MD  cholecalciferol (VITAMIN D) 1000 UNITS tablet Take 1,000 Units by mouth daily.    Historical Provider, MD  Coenzyme Q10 150 MG CAPS Take by mouth 2 times daily at 12 noon and 4 pm.    Historical Provider, MD  Cyanocobalamin (VITAMIN B-12) 1000 MCG SUBL Place 1,000 mcg under the tongue.    Historical Provider, MD  OVER THE COUNTER MEDICATION Take 1 each by mouth daily. Hemp heart seeds    Historical Provider, MD  polyethylene glycol (MIRALAX / GLYCOLAX) packet Take 17 g by mouth daily as needed for mild constipation.    Historical Provider, MD  polyvinyl alcohol (LIQUIFILM TEARS) 1.4 % ophthalmic solution Place 1 drop into both eyes as needed. For dry eyes    Historical Provider, MD  Probiotic Product (PROBIOTIC DAILY PO) Take by mouth daily. [place] CFU    Historical Provider, MD   BP 139/81 mmHg  Pulse 70  Temp(Src) 97.7 F (36.5 C) (Oral)  Ht 5\' 3"  (1.6 m)  Wt 61.236 kg  BMI 23.92 kg/m2  SpO2 98% Physical Exam  Constitutional: She is oriented to person, place, and time. She appears well-developed and well-nourished. No distress.  HENT:  Head: Normocephalic.  Eyes: Conjunctivae are normal.  Neck: Neck supple.  Cardiovascular: Normal rate and normal heart sounds.  An irregularly irregular rhythm present.  Pulmonary/Chest: Effort normal and breath sounds normal. No respiratory distress. She has no wheezes. She has no rales.  Abdominal: Soft. Bowel sounds are normal. She exhibits no distension. There is no tenderness. There is no rebound.  Musculoskeletal: She exhibits no  edema.  Neurological: She is alert and oriented to person, place, and time.  Skin: Skin is warm and dry.  Psychiatric: She has a normal mood and affect. Her behavior is normal.  Nursing note and vitals reviewed.   ED Course  Procedures (including critical care time) Labs Review Labs Reviewed  CBC WITH DIFFERENTIAL/PLATELET - Abnormal; Notable for the following:    RBC 3.85 (*)    MCV 101.3 (*)    Platelets 130 (*)    All other components within normal limits  COMPREHENSIVE METABOLIC PANEL  URINALYSIS, ROUTINE W REFLEX MICROSCOPIC (NOT AT [facility])  Randolm Idol, ED    Imaging Review Dg Chest 2 View  12/29/2015  CLINICAL DATA:  Syncope EXAM: CHEST  2 VIEW COMPARISON:  03/19/2014 FINDINGS: Cardiac enlargement. Mitral valve replacement. Negative for heart failure or edema COPD with hyperinflation and prominent lung markings.  Negative for pneumonia. Negative for mass or effusion. IMPRESSION: Cardiac enlargement COPD No acute radiographic abnormality. Electronically Signed   By: Franchot Gallo M.D.   On: 12/29/2015 12:58   I have personally reviewed and evaluated these images and lab results as part of my medical decision-making.   EKG Interpretation None      MDM   Final diagnoses:  Near syncope  UTI (lower urinary tract infection)   Patient emergency department with an episode of sudden dizziness, diaphoresis, nausea. Currently at baseline. States she has felt blue and down over the last several days, she is currently moving into a new house. We'll check labs, EKG, monitor. The stomach vital signs are normal. Patient appears to be in A. fib which is chronic. Rate is controlled.   Labs unremarkable. Urinalysis showing infection. Concerning for an arrhythmia. I ordered patient Rocephin for her UTI. will call for admission for observation.  I discussed with hospitalist who has seen patient as well. They believe patient may be stable enough for discharge home with close outpatient  follow-up. Patient is very nervous about taking antibiotics at home. She is worried she is going to get sick. Will try 1 dose of fosfomycin here. I did send the urine cultures. Vital signs are normal at this time. Patient is in no acute distress and it is asymptomatic.  Filed Vitals:   12/29/15 1415 12/29/15 1445 12/29/15 1500 12/29/15 1530  BP: 139/88 124/70 126/65 120/59  Pulse: 107 61 67 90  Temp:      TempSrc:      Resp: 19 16 16 18   Height:      Weight:      SpO2: 89% 100% 97% 99%     Jeannett Senior, PA-C 12/30/15 [place], MD 01/09/16 [phone]

## 2015-12-29 NOTE — Telephone Encounter (Signed)
Representative from [facility] Mliss Sax) called to report that patient called for help after she had a dizzy spell, became weak, and fell.  When they got to the patient she was nauseous, sweaty, and her heart was racing. They report no injuries occurred to the patient during the fall. The patient reported to [facility] that she did not pass out, she just got so weak during the episode she fell. They report no VS. Mliss Sax reports the patient is still very weak, sweaty, and her heart is erratic. Instructed her to go to ED. Mliss Sax st that the patient was already instructed to go to ED, but she refused. Reiterated to her to encourage patient to go. She agreed with treatment plan.

## 2015-12-29 NOTE — Telephone Encounter (Signed)
This encounter was created in error - please disregard.

## 2015-12-29 NOTE — ED Notes (Signed)
Pt. D/C papers reviewed patient denies questions. Patient being transported home by son

## 2015-12-29 NOTE — Discharge Instructions (Signed)
You were treated today for UTI. Make sure to continue to drink plenty of fluids. Follow up with your doctor for recheck. Return if any more episodes of similar symptoms.   Near-Syncope Near-syncope (commonly known as near fainting) is sudden weakness, dizziness, or feeling like you might pass out. During an episode of near-syncope, you may also develop pale skin, have tunnel vision, or feel sick to your stomach (nauseous). Near-syncope may occur when getting up after sitting or while standing for a long time. It is caused by a sudden decrease in blood flow to the brain. This decrease can result from various causes or triggers, most of which are not serious. However, because near-syncope can sometimes be a sign of something serious, a medical evaluation is required. The specific cause is often not determined. HOME CARE INSTRUCTIONS  Monitor your condition for any changes. The following actions may help to alleviate any discomfort you are experiencing:  Have someone stay with you until you feel stable.  Lie down right away and prop your feet up if you start feeling like you might faint. Breathe deeply and steadily. Wait until all the symptoms have passed. Most of these episodes last only a few minutes. You may feel tired for several hours.   Drink enough fluids to keep your urine clear or pale yellow.   If you are taking blood pressure or heart medicine, get up slowly when seated or lying down. Take several minutes to sit and then stand. This can reduce dizziness.  Follow up with your health care provider as directed. SEEK IMMEDIATE MEDICAL CARE IF:   You have a severe headache.   You have unusual pain in the chest, abdomen, or back.   You are bleeding from the mouth or rectum, or you have black or tarry stool.   You have an irregular or very fast heartbeat.   You have repeated fainting or have seizure-like jerking during an episode.   You faint when sitting or lying down.   You  have confusion.   You have difficulty walking.   You have severe weakness.   You have vision problems.  MAKE SURE YOU:   Understand these instructions.  Will watch your condition.  Will get help right away if you are not doing well or get worse.   This information is not intended to replace advice given to you by your health care provider. Make sure you discuss any questions you have with your health care provider.   Document Released: 06/25/2005 Document Revised: 06/30/2013 Document Reviewed: 11/28/2012 Elsevier Interactive Patient Education 2016 [employer]  Urinary Tract Infection Urinary tract infections (UTIs) can develop anywhere along your urinary tract. Your urinary tract is your body's drainage system for removing wastes and extra water. Your urinary tract includes two kidneys, two ureters, a bladder, and a urethra. Your kidneys are a pair of bean-shaped organs. Each kidney is about the size of your fist. They are located below your ribs, one on each side of your spine. CAUSES Infections are caused by microbes, which are microscopic organisms, including fungi, viruses, and bacteria. These organisms are so small that they can only be seen through a microscope. Bacteria are the microbes that most commonly cause UTIs. SYMPTOMS  Symptoms of UTIs may vary by age and gender of the patient and by the location of the infection. Symptoms in young women typically include a frequent and intense urge to urinate and a painful, burning feeling in the bladder or urethra during urination. Older  women and men are more likely to be tired, shaky, and weak and have muscle aches and abdominal pain. A fever may mean the infection is in your kidneys. Other symptoms of a kidney infection include pain in your back or sides below the ribs, nausea, and vomiting. DIAGNOSIS To diagnose a UTI, your caregiver will ask you about your symptoms. Your caregiver will also ask you to provide a urine sample.  The urine sample will be tested for bacteria and white blood cells. White blood cells are made by your body to help fight infection. TREATMENT  Typically, UTIs can be treated with medication. Because most UTIs are caused by a bacterial infection, they usually can be treated with the use of antibiotics. The choice of antibiotic and length of treatment depend on your symptoms and the type of bacteria causing your infection. HOME CARE INSTRUCTIONS  If you were prescribed antibiotics, take them exactly as your caregiver instructs you. Finish the medication even if you feel better after you have only taken some of the medication.  Drink enough water and fluids to keep your urine clear or pale yellow.  Avoid caffeine, tea, and carbonated beverages. They tend to irritate your bladder.  Empty your bladder often. Avoid holding urine for long periods of time.  Empty your bladder before and after sexual intercourse.  After a bowel movement, women should cleanse from front to back. Use each tissue only once. SEEK MEDICAL CARE IF:   You have back pain.  You develop a fever.  Your symptoms do not begin to resolve within 3 days. SEEK IMMEDIATE MEDICAL CARE IF:   You have severe back pain or lower abdominal pain.  You develop chills.  You have nausea or vomiting.  You have continued burning or discomfort with urination. MAKE SURE YOU:   Understand these instructions.  Will watch your condition.  Will get help right away if you are not doing well or get worse.   This information is not intended to replace advice given to you by your health care provider. Make sure you discuss any questions you have with your health care provider.   Document Released: 04/04/2005 Document Revised: 03/16/2015 Document Reviewed: 08/03/2011 Elsevier Interactive Patient Education [employer].

## 2015-12-30 LAB — URINE CULTURE

## 2016-01-02 DIAGNOSIS — N390 Urinary tract infection, site not specified: Secondary | ICD-10-CM | POA: Diagnosis not present

## 2016-01-23 DIAGNOSIS — F5102 Adjustment insomnia: Secondary | ICD-10-CM | POA: Diagnosis not present

## 2016-01-23 DIAGNOSIS — I4891 Unspecified atrial fibrillation: Secondary | ICD-10-CM | POA: Diagnosis not present

## 2016-01-23 DIAGNOSIS — F419 Anxiety disorder, unspecified: Secondary | ICD-10-CM | POA: Diagnosis not present

## 2016-01-23 DIAGNOSIS — R350 Frequency of micturition: Secondary | ICD-10-CM | POA: Diagnosis not present

## 2016-02-08 DIAGNOSIS — N390 Urinary tract infection, site not specified: Secondary | ICD-10-CM | POA: Diagnosis not present

## 2016-02-09 DIAGNOSIS — N3289 Other specified disorders of bladder: Secondary | ICD-10-CM | POA: Diagnosis not present

## 2016-03-06 DIAGNOSIS — F5102 Adjustment insomnia: Secondary | ICD-10-CM | POA: Diagnosis not present

## 2016-03-06 DIAGNOSIS — C50919 Malignant neoplasm of unspecified site of unspecified female breast: Secondary | ICD-10-CM | POA: Diagnosis not present

## 2016-03-06 DIAGNOSIS — D500 Iron deficiency anemia secondary to blood loss (chronic): Secondary | ICD-10-CM | POA: Diagnosis not present

## 2016-03-06 DIAGNOSIS — Z0001 Encounter for general adult medical examination with abnormal findings: Secondary | ICD-10-CM | POA: Diagnosis not present

## 2016-03-06 DIAGNOSIS — Z952 Presence of prosthetic heart valve: Secondary | ICD-10-CM | POA: Diagnosis not present

## 2016-03-06 DIAGNOSIS — I509 Heart failure, unspecified: Secondary | ICD-10-CM | POA: Diagnosis not present

## 2016-03-06 DIAGNOSIS — I4891 Unspecified atrial fibrillation: Secondary | ICD-10-CM | POA: Diagnosis not present

## 2016-03-06 DIAGNOSIS — I341 Nonrheumatic mitral (valve) prolapse: Secondary | ICD-10-CM | POA: Diagnosis not present

## 2016-03-06 DIAGNOSIS — Z23 Encounter for immunization: Secondary | ICD-10-CM | POA: Diagnosis not present

## 2016-03-06 DIAGNOSIS — F419 Anxiety disorder, unspecified: Secondary | ICD-10-CM | POA: Diagnosis not present

## 2016-03-06 DIAGNOSIS — R350 Frequency of micturition: Secondary | ICD-10-CM | POA: Diagnosis not present

## 2016-03-15 ENCOUNTER — Other Ambulatory Visit: Payer: Self-pay

## 2016-03-30 DIAGNOSIS — H6121 Impacted cerumen, right ear: Secondary | ICD-10-CM | POA: Diagnosis not present

## 2016-05-15 DIAGNOSIS — H401121 Primary open-angle glaucoma, left eye, mild stage: Secondary | ICD-10-CM | POA: Diagnosis not present

## 2016-05-15 DIAGNOSIS — H401111 Primary open-angle glaucoma, right eye, mild stage: Secondary | ICD-10-CM | POA: Diagnosis not present

## 2016-05-16 DIAGNOSIS — H8110 Benign paroxysmal vertigo, unspecified ear: Secondary | ICD-10-CM | POA: Diagnosis not present

## 2016-07-11 DIAGNOSIS — I509 Heart failure, unspecified: Secondary | ICD-10-CM | POA: Diagnosis not present

## 2016-07-11 DIAGNOSIS — I10 Essential (primary) hypertension: Secondary | ICD-10-CM | POA: Diagnosis not present

## 2016-07-11 DIAGNOSIS — R600 Localized edema: Secondary | ICD-10-CM | POA: Diagnosis not present

## 2016-07-11 DIAGNOSIS — R350 Frequency of micturition: Secondary | ICD-10-CM | POA: Diagnosis not present

## 2016-07-11 DIAGNOSIS — Z952 Presence of prosthetic heart valve: Secondary | ICD-10-CM | POA: Diagnosis not present

## 2016-07-11 DIAGNOSIS — I4891 Unspecified atrial fibrillation: Secondary | ICD-10-CM | POA: Diagnosis not present

## 2016-07-19 ENCOUNTER — Ambulatory Visit (INDEPENDENT_AMBULATORY_CARE_PROVIDER_SITE_OTHER): Payer: Medicare Other | Admitting: Interventional Cardiology

## 2016-07-19 ENCOUNTER — Encounter: Payer: Self-pay | Admitting: Interventional Cardiology

## 2016-07-19 VITALS — BP 124/72 | HR 70 | Ht 63.0 in | Wt 139.8 lb

## 2016-07-19 DIAGNOSIS — Z9889 Other specified postprocedural states: Secondary | ICD-10-CM

## 2016-07-19 DIAGNOSIS — Z7901 Long term (current) use of anticoagulants: Secondary | ICD-10-CM | POA: Diagnosis not present

## 2016-07-19 DIAGNOSIS — I480 Paroxysmal atrial fibrillation: Secondary | ICD-10-CM | POA: Diagnosis not present

## 2016-07-19 DIAGNOSIS — Z5181 Encounter for therapeutic drug level monitoring: Secondary | ICD-10-CM | POA: Diagnosis not present

## 2016-07-19 NOTE — Progress Notes (Signed)
Cardiology Office Note    Date:  07/19/2016   ID:  [name], DOB 10-May-1926, MRN [id number]  PCP:  Tara Screws, MD  Cardiologist: Tara Grooms, MD   Chief Complaint  Patient presents with  . Cardiac Valve Problem    Mitral valve repair  . Atrial Fibrillation    History of Present Illness:  [name] is a 83 y.o. female for Follow-up of mitral valve repair, persistent atrial fibrillation, diastolic heart failure, and chronic anticoagulation therapy.  She is doing well. She now lives at [facility] She has benign positional vertigo as a new diagnosis. This was causing episodes of severe dizziness and balance impairment. She has not had orthopnea, PND, palpitations, chest pain, or orthopnea. She has experienced lower extremity swelling and states that if she controls sodium intake this is not a problem. He began occurring once she moved to the assisted living facility and could not control food preparation.   Past Medical History:  Diagnosis Date  . Anxiety   . Arthritis    Some in neck and back, but "can't complain"  . Atrial fibrillation ([place])    s/p MAZE 2010  . Atypical atrial flutter ([place])   . Cancer [name])    1998 Right breast cancer with lumpectomy and radiation therapy  . Complication of anesthesia    Nausea from versed  . GERD (gastroesophageal reflux disease)   . Glaucoma   . History of mitral valve repair    Mitral valve prolapse, surgery in 2010  . Normocytic anemia 03/21/2014  . Rheumatic fever    age 78    Past Surgical History:  Procedure Laterality Date  . APPENDECTOMY    . BACK SURGERY     Synovial cyst on spine, removed laproscopically in 2000?  [name] BREAST SURGERY     1998 Lumpectomy in right breast  . CARDIOVERSION  04/29/2012   Procedure: CARDIOVERSION;  Surgeon: Tara Margarita, MD;  Location: [place] [facility] ENDOSCOPY;  Service: Cardiovascular;  Laterality: N/A;  . CARDIOVERSION  06/19/2012   Procedure: CARDIOVERSION;  Surgeon: Tara Grooms, MD;  Location: [facility] ENDOSCOPY;  Service: Cardiovascular;  Laterality: N/A;  h/p from 11/8 in file drawer/dl  . CATARACT EXTRACTION    . CESAREAN SECTION     Three C-Sections  . EYE SURGERY     Cataract Surgery, both eyes  . KIDNEY CYST REMOVAL    . MITRAL VALVE REPAIR  June 2010  . TEE WITHOUT CARDIOVERSION  04/29/2012   Procedure: TRANSESOPHAGEAL ECHOCARDIOGRAM (TEE);  Surgeon: Tara Margarita, MD;  Location: [facility] ENDOSCOPY;  Service: Cardiovascular;  Laterality: N/A;  . TONSILLECTOMY      Current Medications: Outpatient Medications Prior to Visit  Medication Sig Dispense Refill  . acetaminophen (TYLENOL) 500 MG tablet Take 500 mg by mouth every 6 (six) hours as needed for mild pain.    [name] apixaban (ELIQUIS) 2.5 MG TABS tablet Take 1 tablet (2.5 mg total) by mouth 2 (two) times daily. 60 tablet 11  . Ascorbic Acid (VITAMIN C) 1000 MG tablet Take 1,000 mg by mouth daily.    [name] atenolol (TENORMIN) 25 MG tablet Take 1 tablet (25 mg total) by mouth daily. 90 tablet 3  . CHIA SEED PO Take 5 mLs by mouth daily.     . cholecalciferol (VITAMIN D) 1000 UNITS tablet Take 1,000 Units by mouth daily.    . Coenzyme Q10 150 MG CAPS Take by mouth 2 times daily at 12 noon  and 4 pm.    . Cyanocobalamin (VITAMIN B-12) 1000 MCG SUBL Place 1,000 mcg under the tongue daily.     . Flaxseed, Linseed, (FLAX SEEDS PO) Take 1 capsule by mouth daily.    . LevOCARNitine (L-CARNITINE) 500 MG TABS Take 500 mg by mouth daily.    [name] LORazepam (ATIVAN) 0.5 MG tablet Take 0.5 mg by mouth 2 (two) times daily.    . magnesium 30 MG tablet Take 30 mg by mouth daily.    . Omega-3 Fatty Acids (FISH OIL) 1000 MG CAPS Take 2,000 mg by mouth daily.    [name] OVER THE COUNTER MEDICATION Take 1 each by mouth daily. Hemp heart seeds    . OVER THE COUNTER MEDICATION Take 1,250 mg by mouth daily. "Omega-Q squid supplement    . PAPAYA PO Take 1 capsule by mouth daily.    . polyethylene glycol (MIRALAX / GLYCOLAX) packet Take 17 g by mouth  daily as needed for mild constipation.    . polyvinyl alcohol (LIQUIFILM TEARS) 1.4 % ophthalmic solution Place 1 drop into both eyes as needed. For dry eyes    . Probiotic Product (PROBIOTIC DAILY PO) Take by mouth daily. 10 Billi CFU    . RESVERATROL PO Take 1 tablet by mouth daily as needed (supplement).    . Taurine 500 MG CAPS Take 500 mg by mouth daily.    . timolol (TIMOPTIC) 0.5 % ophthalmic solution Place 1 drop into the right eye 2 (two) times daily.     No facility-administered medications prior to visit.      Allergies:   Amiodarone; Amoxicillin; Amoxicillin-pot clavulanate; Aspirin; Ciprofloxacin; Codeine; Diuretic [buchu-cornsilk-ch grass-hydran]; Iohexol; Lisinopril; Midazolam hcl; Quinolones; Sotalol; Sulfa antibiotics; Thioxanthenes; Valium; Vancomycin; and Versed [midazolam]   Social History   Social History  . Marital status: Widowed    Spouse name: N/A  . Number of children: N/A  . Years of education: N/A   Social History Main Topics  . Smoking status: Never Smoker  . Smokeless tobacco: Never Used  . Alcohol use No  . Drug use: No  . Sexual activity: No     Comment: Smoked one cigarette a day as a teen   Other Topics Concern  . None   Social History Narrative   Pt lives in [place] alone.  Homemaker.  Widowed     Family History:  The patient's family history includes Other in her sister.   ROS:   Please see the history of present illness.    Benign positional vertigo. Lower extremity swelling has improved with control of sodium intake.  All other systems reviewed and are negative.   PHYSICAL EXAM:   VS:  BP 124/72 (BP Location: Left Arm)   Pulse 70   Ht 5\' 3"  (1.6 m)   Wt 139 lb 12.8 oz (63.4 kg)   BMI 24.76 kg/m    GEN: Well nourished, well developed, in no acute distress  HEENT: normal  Neck: no JVD, carotid bruits, or masses Cardiac: IIRR; no murmurs, rubs, or gallops,no edema  Respiratory:  clear to auscultation bilaterally, normal work  of breathing GI: soft, nontender, nondistended, + BS MS: no deformity or atrophy  Skin: warm and dry, no rash Neuro:  Alert and Oriented x 3, Strength and sensation are intact Psych: euthymic mood, full affect  Wt Readings from Last 3 Encounters:  07/19/16 139 lb 12.8 oz (63.4 kg)  12/29/15 135 lb (61.2 kg)  08/05/15 136 lb (61.7 kg)  Studies/Labs Reviewed:   EKG:  EKG  Performed on 12/31/15, reveals atrial fibrillation with controlled ventricular response.  Recent Labs: 12/29/2015: ALT 36; BUN 16; Creatinine, Ser 0.66; Hemoglobin 12.7; Platelets 130; Potassium 4.2; Sodium 133   Lipid Panel No results found for: CHOL, TRIG, HDL, CHOLHDL, VLDL, LDLCALC, LDLDIRECT  Additional studies/ records that were reviewed today include:  No recent echocardiogram is been performed. Most recent study was March 2014. Normal valve function was noted at that time.    ASSESSMENT:    1. Paroxysmal atrial fibrillation (HCC)   2. H/O mitral valve repair   3. Anticoagulation goal of INR 2 to 3      PLAN:  In order of problems listed above:  1. Atrial fibrillation is continuous/chronic. Rate is well controlled. No change in current therapy. 2. No clinical evidence of mitral valve regurgitation or dysfunction. 3. She is on Eliquis 2.3 milligrams twice a day without complications. Kidney function needs to be checked at least yearly and probably more frequent    Medication Adjustments/Labs and Tests Ordered: Current medicines are reviewed at length with the patient today.  Concerns regarding medicines are outlined above.  Medication changes, Labs and Tests ordered today are listed in the Patient Instructions below. Patient Instructions  Medication Instructions:  Your physician recommends that you continue on your current medications as directed. Please refer to the Current Medication list given to you today.   Labwork: None ordered    Testing/Procedures: None  ordered  Follow-Up: Your physician wants you to follow-up in 1 year with Dr. Tamala Julian. You will receive a reminder letter in the mail two months in advance. If you don't receive a letter, please call our office to schedule the follow-up appointment.   Any Other Special Instructions Will Be Listed Below (If Applicable).     If you need a refill on your cardiac medications before your next appointment, please call your pharmacy.      Signed, Tara Grooms, MD  07/19/2016 4:01 PM    [facility] [place], [place], [place]  [address] Phone: [phone]; Fax: [phone]

## 2016-07-19 NOTE — Patient Instructions (Signed)

## 2016-08-09 DIAGNOSIS — L814 Other melanin hyperpigmentation: Secondary | ICD-10-CM | POA: Diagnosis not present

## 2016-08-09 DIAGNOSIS — D1801 Hemangioma of skin and subcutaneous tissue: Secondary | ICD-10-CM | POA: Diagnosis not present

## 2016-08-09 DIAGNOSIS — L821 Other seborrheic keratosis: Secondary | ICD-10-CM | POA: Diagnosis not present

## 2016-08-22 ENCOUNTER — Other Ambulatory Visit: Payer: Self-pay | Admitting: Interventional Cardiology

## 2016-08-29 DIAGNOSIS — H6121 Impacted cerumen, right ear: Secondary | ICD-10-CM | POA: Diagnosis not present

## 2016-09-11 DIAGNOSIS — H52223 Regular astigmatism, bilateral: Secondary | ICD-10-CM | POA: Diagnosis not present

## 2016-09-11 DIAGNOSIS — H401121 Primary open-angle glaucoma, left eye, mild stage: Secondary | ICD-10-CM | POA: Diagnosis not present

## 2016-09-11 DIAGNOSIS — H1850 Unspecified hereditary corneal dystrophies: Secondary | ICD-10-CM | POA: Diagnosis not present

## 2016-09-11 DIAGNOSIS — H5203 Hypermetropia, bilateral: Secondary | ICD-10-CM | POA: Diagnosis not present

## 2016-09-11 DIAGNOSIS — Z961 Presence of intraocular lens: Secondary | ICD-10-CM | POA: Diagnosis not present

## 2016-09-11 DIAGNOSIS — H401111 Primary open-angle glaucoma, right eye, mild stage: Secondary | ICD-10-CM | POA: Diagnosis not present

## 2016-09-17 ENCOUNTER — Other Ambulatory Visit: Payer: Self-pay | Admitting: Internal Medicine

## 2016-09-17 DIAGNOSIS — R10812 Left upper quadrant abdominal tenderness: Secondary | ICD-10-CM

## 2016-09-17 DIAGNOSIS — R6881 Early satiety: Secondary | ICD-10-CM | POA: Diagnosis not present

## 2016-09-17 DIAGNOSIS — R309 Painful micturition, unspecified: Secondary | ICD-10-CM | POA: Diagnosis not present

## 2016-09-24 ENCOUNTER — Other Ambulatory Visit: Payer: Self-pay | Admitting: Internal Medicine

## 2016-09-24 ENCOUNTER — Ambulatory Visit
Admission: RE | Admit: 2016-09-24 | Discharge: 2016-09-24 | Disposition: A | Discharge status: Home / Self Care | Payer: Medicare Other | Source: Ambulatory Visit | Attending: Internal Medicine | Admitting: Internal Medicine

## 2016-09-24 DIAGNOSIS — R10812 Left upper quadrant abdominal tenderness: Secondary | ICD-10-CM

## 2016-09-24 DIAGNOSIS — K449 Diaphragmatic hernia without obstruction or gangrene: Secondary | ICD-10-CM | POA: Diagnosis not present

## 2016-10-22 DIAGNOSIS — R6881 Early satiety: Secondary | ICD-10-CM | POA: Diagnosis not present

## 2016-10-22 DIAGNOSIS — R10812 Left upper quadrant abdominal tenderness: Secondary | ICD-10-CM | POA: Diagnosis not present

## 2016-10-22 DIAGNOSIS — I10 Essential (primary) hypertension: Secondary | ICD-10-CM | POA: Diagnosis not present

## 2016-10-22 DIAGNOSIS — N390 Urinary tract infection, site not specified: Secondary | ICD-10-CM | POA: Diagnosis not present

## 2016-10-22 DIAGNOSIS — Z952 Presence of prosthetic heart valve: Secondary | ICD-10-CM | POA: Diagnosis not present

## 2016-10-22 DIAGNOSIS — I4891 Unspecified atrial fibrillation: Secondary | ICD-10-CM | POA: Diagnosis not present

## 2016-10-22 DIAGNOSIS — I509 Heart failure, unspecified: Secondary | ICD-10-CM | POA: Diagnosis not present

## 2016-10-23 DIAGNOSIS — H401121 Primary open-angle glaucoma, left eye, mild stage: Secondary | ICD-10-CM | POA: Diagnosis not present

## 2016-10-23 DIAGNOSIS — H04123 Dry eye syndrome of bilateral lacrimal glands: Secondary | ICD-10-CM | POA: Diagnosis not present

## 2016-10-23 DIAGNOSIS — H5203 Hypermetropia, bilateral: Secondary | ICD-10-CM | POA: Diagnosis not present

## 2016-10-23 DIAGNOSIS — H401111 Primary open-angle glaucoma, right eye, mild stage: Secondary | ICD-10-CM | POA: Diagnosis not present

## 2016-10-23 DIAGNOSIS — H52223 Regular astigmatism, bilateral: Secondary | ICD-10-CM | POA: Diagnosis not present

## 2016-10-23 DIAGNOSIS — H1850 Unspecified hereditary corneal dystrophies: Secondary | ICD-10-CM | POA: Diagnosis not present

## 2016-11-14 DIAGNOSIS — K130 Diseases of lips: Secondary | ICD-10-CM | POA: Diagnosis not present

## 2016-11-17 ENCOUNTER — Emergency Department (HOSPITAL_COMMUNITY): Payer: Medicare Other

## 2016-11-17 ENCOUNTER — Encounter (HOSPITAL_COMMUNITY): Payer: Self-pay | Admitting: Emergency Medicine

## 2016-11-17 ENCOUNTER — Emergency Department (HOSPITAL_COMMUNITY)
Admission: EM | Admit: 2016-11-17 | Discharge: 2016-11-17 | Disposition: A | Discharge status: Home / Self Care | Payer: Medicare Other | Attending: Emergency Medicine | Admitting: Emergency Medicine

## 2016-11-17 DIAGNOSIS — R0789 Other chest pain: Secondary | ICD-10-CM | POA: Insufficient documentation

## 2016-11-17 DIAGNOSIS — R079 Chest pain, unspecified: Secondary | ICD-10-CM | POA: Diagnosis not present

## 2016-11-17 DIAGNOSIS — Z79899 Other long term (current) drug therapy: Secondary | ICD-10-CM | POA: Diagnosis not present

## 2016-11-17 DIAGNOSIS — K219 Gastro-esophageal reflux disease without esophagitis: Secondary | ICD-10-CM | POA: Diagnosis not present

## 2016-11-17 DIAGNOSIS — Z7901 Long term (current) use of anticoagulants: Secondary | ICD-10-CM | POA: Insufficient documentation

## 2016-11-17 HISTORY — DX: Diaphragmatic hernia without obstruction or gangrene: K44.9

## 2016-11-17 LAB — COMPREHENSIVE METABOLIC PANEL
ALT: 21 U/L (ref 14–54)
AST: 26 U/L (ref 15–41)
Albumin: 4 g/dL (ref 3.5–5.0)
Alkaline Phosphatase: 55 U/L (ref 38–126)
Anion gap: 9 (ref 5–15)
BUN: 13 mg/dL (ref 6–20)
CO2: 26 mmol/L (ref 22–32)
Calcium: 9.1 mg/dL (ref 8.9–10.3)
Chloride: 98 mmol/L — ABNORMAL LOW (ref 101–111)
Creatinine, Ser: 0.58 mg/dL (ref 0.44–1.00)
GFR calc Af Amer: >60 mL/min (ref >60)
GFR calc non Af Amer: >60 mL/min (ref >60)
Glucose, Bld: 98 mg/dL (ref 65–99)
Potassium: 3.8 mmol/L (ref 3.5–5.1)
Sodium: 133 mmol/L — ABNORMAL LOW (ref 135–145)
Total Bilirubin: 1.4 mg/dL — ABNORMAL HIGH (ref 0.3–1.2)
Total Protein: 7.3 g/dL (ref 6.5–8.1)

## 2016-11-17 LAB — CBC WITH DIFFERENTIAL/PLATELET
Basophils Absolute: 0 10*3/uL (ref 0.0–0.1)
Basophils Relative: 0 %
Eosinophils Absolute: 0.1 10*3/uL (ref 0.0–0.7)
Eosinophils Relative: 1 %
HCT: 37.5 % (ref 36.0–46.0)
Hemoglobin: 12.8 g/dL (ref 12.0–15.0)
Lymphocytes Relative: 35 %
Lymphs Abs: 2 10*3/uL (ref 0.7–4.0)
MCH: 34.2 pg — ABNORMAL HIGH (ref 26.0–34.0)
MCHC: 34.1 g/dL (ref 30.0–36.0)
MCV: 100.3 fL — ABNORMAL HIGH (ref 78.0–100.0)
Monocytes Absolute: 0.5 10*3/uL (ref 0.1–1.0)
Monocytes Relative: 8 %
Neutro Abs: 3.2 10*3/uL (ref 1.7–7.7)
Neutrophils Relative %: 56 %
Platelets: 143 10*3/uL — ABNORMAL LOW (ref 150–400)
RBC: 3.74 MIL/uL — ABNORMAL LOW (ref 3.87–5.11)
RDW: 13 % (ref 11.5–15.5)
WBC: 5.8 10*3/uL (ref 4.0–10.5)

## 2016-11-17 LAB — TROPONIN I: Troponin I: <0.03 ng/mL (ref <0.03)

## 2016-11-17 LAB — LIPASE, BLOOD: Lipase: 22 U/L (ref 11–51)

## 2016-11-17 MED ORDER — PANTOPRAZOLE SODIUM 20 MG PO TBEC: 20.0000 mg | DELAYED_RELEASE_TABLET | Freq: Every day | ORAL | 0 refills | Status: DC

## 2016-11-17 MED ORDER — ACETAMINOPHEN 325 MG PO TABS
325.0000 mg | ORAL_TABLET | Freq: Once | ORAL | Status: AC
  Administered 2016-11-17: 325 mg via ORAL
  Filled 2016-11-17: qty 1

## 2016-11-17 MED ORDER — GI COCKTAIL ~~LOC~~
30.0000 mL | Freq: Once | ORAL | Status: AC
  Administered 2016-11-17: 30 mL via ORAL
  Filled 2016-11-17: qty 30

## 2016-11-17 MED ORDER — SODIUM CHLORIDE 0.9 % IV SOLN
INTRAVENOUS | Status: DC
  Administered 2016-11-17: 20:00:00 via INTRAVENOUS

## 2016-11-17 NOTE — ED Triage Notes (Signed)
Per EMS pt from [facility], generalized nagging ache over chest that started after lunch, pt became N that passed, a-fib 60-100, 1 nitro no change in pain, allergic to aspirin

## 2016-11-17 NOTE — ED Provider Notes (Signed)
[facility] DEPT Provider Note   CSN: [id number] Arrival date & time: 11/17/16  1828     History   Chief Complaint Chief Complaint  Patient presents with  . Chest Pain    HPI [name] is a 83 y.o. female.  83 year old female presents with diffuse chest discomfort which began today after she ate lunch. She lunch approximately 7 hours ago and had a salad and since that time his foot at full dose in her chest and upper abdomen. No associated anginal symptoms. She did take an antacid with limited relief. Has been passing flatus without relief. Has not been belching. Denies any exertional component to this. Called EMS and was given one nitroglycerin which did not change her symptoms.      Past Medical History:  Diagnosis Date  . Anxiety   . Arthritis    Some in neck and back, but "can't complain"  . Atrial fibrillation ([place])    s/p MAZE 2010  . Atypical atrial flutter ([place])   . Cancer [facility])    1998 Right breast cancer with lumpectomy and radiation therapy  . Complication of anesthesia    Nausea from versed  . GERD (gastroesophageal reflux disease)   . Glaucoma   . Hiatal hernia   . History of mitral valve repair    Mitral valve prolapse, surgery in 2010  . Normocytic anemia 03/21/2014  . Rheumatic fever    age 40    Patient Active Problem List   Diagnosis Date Noted  . Hyponatremia 03/21/2014  . Normocytic anemia 03/21/2014  . Colitis 03/19/2014  . Knee pain, left anterior 01/20/2014  . H/O mitral valve repair 05/13/2013  . Atrial fibrillation ([place]) 06/19/2012    Class: Acute  . Anticoagulation goal of INR 2 to 3 05/01/2012    Class: Acute  . Chest pain 04/28/2012  . SOB (shortness of breath) on exertion 04/28/2012  . History of atrial flutter 04/28/2012  . GERD (gastroesophageal reflux disease)     Past Surgical History:  Procedure Laterality Date  . APPENDECTOMY    . BACK SURGERY     Synovial cyst on spine, removed laproscopically in 2000?  [name]  BREAST SURGERY     1998 Lumpectomy in right breast  . CARDIOVERSION  04/29/2012   Procedure: CARDIOVERSION;  Surgeon: [name], MD;  Location: [facility] ENDOSCOPY;  Service: Cardiovascular;  Laterality: N/A;  . CARDIOVERSION  06/19/2012   Procedure: CARDIOVERSION;  Surgeon: Sinclair Grooms, MD;  Location: [facility] ENDOSCOPY;  Service: Cardiovascular;  Laterality: N/A;  h/p from 11/8 in file drawer/dl  . CATARACT EXTRACTION    . CESAREAN SECTION     Three C-Sections  . EYE SURGERY     Cataract Surgery, both eyes  . KIDNEY CYST REMOVAL    . MITRAL VALVE REPAIR  June 2010  . TEE WITHOUT CARDIOVERSION  04/29/2012   Procedure: TRANSESOPHAGEAL ECHOCARDIOGRAM (TEE);  Surgeon: [name], MD;  Location: [facility] ENDOSCOPY;  Service: Cardiovascular;  Laterality: N/A;  . TONSILLECTOMY      OB History    No data available       Home Medications    Prior to Admission medications   Medication Sig Start Date End Date Taking? Authorizing Provider  acetaminophen (TYLENOL) 500 MG tablet Take 500 mg by mouth every 6 (six) hours as needed for mild pain.    [provider]  apixaban (ELIQUIS) 2.5 MG TABS tablet Take 1 tablet (2.5 mg total) by mouth 2 (two)  times daily. 11/11/15   Belva Crome, MD  Ascorbic Acid (VITAMIN C) 1000 MG tablet Take 1,000 mg by mouth daily.    [provider]  atenolol (TENORMIN) 25 MG tablet TAKE 1 TABLET ONCE DAILY. 08/22/16   Belva Crome, MD  CHIA SEED PO Take 5 mLs by mouth daily.     [provider]  cholecalciferol (VITAMIN D) 1000 UNITS tablet Take 1,000 Units by mouth daily.    [provider]  Coenzyme Q10 150 MG CAPS Take by mouth 2 times daily at 12 noon and 4 pm.    [provider]  Cyanocobalamin (VITAMIN B-12) 1000 MCG SUBL Place 1,000 mcg under the tongue daily.     [provider]  Flaxseed, Linseed, (FLAX SEEDS PO) Take 1 capsule by mouth daily.    [provider]  LevOCARNitine (L-CARNITINE) 500 MG  TABS Take 500 mg by mouth daily.    [provider]  LORazepam (ATIVAN) 0.5 MG tablet Take 0.5 mg by mouth 2 (two) times daily.    [provider]  magnesium 30 MG tablet Take 30 mg by mouth daily.    [provider]  Omega-3 Fatty Acids (FISH OIL) 1000 MG CAPS Take 2,000 mg by mouth daily.    [provider]  OVER THE COUNTER MEDICATION Take 1 each by mouth daily. Hemp heart seeds    [provider]  OVER THE COUNTER MEDICATION Take 1,250 mg by mouth daily. "Omega-Q squid supplement    [provider]  PAPAYA PO Take 1 capsule by mouth daily.    [provider]  polyethylene glycol (MIRALAX / GLYCOLAX) packet Take 17 g by mouth daily as needed for mild constipation.    [provider]  polyvinyl alcohol (LIQUIFILM TEARS) 1.4 % ophthalmic solution Place 1 drop into both eyes as needed. For dry eyes    [provider]  Probiotic Product (PROBIOTIC DAILY PO) Take by mouth daily. 10 Billi CFU    [provider]  RESVERATROL PO Take 1 tablet by mouth daily as needed (supplement).    [provider]  Taurine 500 MG CAPS Take 500 mg by mouth daily.    [provider]  timolol (TIMOPTIC) 0.5 % ophthalmic solution Place 1 drop into the right eye 2 (two) times daily. 11/04/15   [provider]    Family History Family History  Problem Relation Age of Onset  . Other Sister     Social History Social History  Substance Use Topics  . Smoking status: Never Smoker  . Smokeless tobacco: Never Used  . Alcohol use No     Allergies   Amiodarone; Amoxicillin; Amoxicillin-pot clavulanate; Aspirin; Ciprofloxacin; Codeine; Diuretic [buchu-cornsilk-ch grass-hydran]; Iohexol; Lisinopril; Midazolam hcl; Quinolones; Sotalol; Sulfa antibiotics; Thioxanthenes; Valium; Vancomycin; and Versed [midazolam]   Review of Systems Review of Systems  All other systems reviewed and are  negative.    Physical Exam Updated Vital Signs SpO2 97%   Physical Exam  Constitutional: She is oriented to person, place, and time. She appears well-developed and well-nourished.  Non-toxic appearance. No distress.  HENT:  Head: Normocephalic and atraumatic.  Eyes: Conjunctivae, EOM and lids are normal. Pupils are equal, round, and reactive to light.  Neck: Normal range of motion. Neck supple. No tracheal deviation present. No thyroid mass present.  Cardiovascular: Normal rate, regular rhythm and normal heart sounds.  Exam reveals no gallop.   No murmur heard. Pulmonary/Chest: Effort normal and breath sounds  normal. No stridor. No respiratory distress. She has no decreased breath sounds. She has no wheezes. She has no rhonchi. She has no rales.  Abdominal: Soft. Normal appearance and bowel sounds are normal. She exhibits no distension. There is no tenderness. There is no rebound and no CVA tenderness.  Musculoskeletal: Normal range of motion. She exhibits no edema or tenderness.  Neurological: She is alert and oriented to person, place, and time. She has normal strength. No cranial nerve deficit or sensory deficit. GCS eye subscore is 4. GCS verbal subscore is 5. GCS motor subscore is 6.  Skin: Skin is warm and dry. No abrasion and no rash noted.  Psychiatric: She has a normal mood and affect. Her speech is normal and behavior is normal.  Nursing note and vitals reviewed.    ED Treatments / Results  Labs (all labs ordered are listed, but only abnormal results are displayed) Labs Reviewed - No data to display  EKG  EKG Interpretation  Date/Time:  Saturday Nov 17 2016 18:52:53 EDT Ventricular Rate:  68 PR Interval:    QRS Duration: 95 QT Interval:  395 QTC Calculation: 421 R Axis:   -100 Text Interpretation:  Atrial fibrillation Left anterior fascicular block Borderline ST depression, lateral leads Baseline wander in lead(s) V3 Confirmed by Gaelyn Tukes  MD, Roza Creamer ([id number]) on  11/17/2016 10:03:59 PM       Radiology No results found.  Procedures Procedures (including critical care time)  Medications Ordered in ED Medications - No data to display   Initial Impression / Assessment and Plan / ED Course  I have reviewed the triage vital signs and the nursing notes.  Pertinent labs & imaging results that were available during my care of the patient were reviewed by me and considered in my medical decision making (see chart for details).    Patient has history of chronic A. fib. She is on Eilquis. Patient given GI cocktail and Pepcid feels better. Suspect reflux.  Final Clinical Impressions(s) / ED Diagnoses   Final diagnoses:  None    New Prescriptions New Prescriptions   No medications on file     Lacretia Leigh, MD 11/17/16 2204

## 2016-11-21 DIAGNOSIS — R350 Frequency of micturition: Secondary | ICD-10-CM | POA: Diagnosis not present

## 2016-11-21 DIAGNOSIS — N3946 Mixed incontinence: Secondary | ICD-10-CM | POA: Diagnosis not present

## 2016-11-27 DIAGNOSIS — N390 Urinary tract infection, site not specified: Secondary | ICD-10-CM | POA: Diagnosis not present

## 2016-12-03 ENCOUNTER — Other Ambulatory Visit: Payer: Self-pay | Admitting: Interventional Cardiology

## 2016-12-17 ENCOUNTER — Other Ambulatory Visit: Payer: Self-pay | Admitting: Internal Medicine

## 2016-12-17 DIAGNOSIS — Z1231 Encounter for screening mammogram for malignant neoplasm of breast: Secondary | ICD-10-CM

## 2016-12-26 DIAGNOSIS — K130 Diseases of lips: Secondary | ICD-10-CM | POA: Diagnosis not present

## 2016-12-31 ENCOUNTER — Ambulatory Visit
Admission: RE | Admit: 2016-12-31 | Discharge: 2016-12-31 | Disposition: A | Discharge status: Home / Self Care | Payer: Medicare Other | Source: Ambulatory Visit | Attending: Internal Medicine | Admitting: Internal Medicine

## 2016-12-31 DIAGNOSIS — Z1231 Encounter for screening mammogram for malignant neoplasm of breast: Secondary | ICD-10-CM | POA: Diagnosis not present

## 2016-12-31 HISTORY — DX: Personal history of irradiation: Z92.3

## 2016-12-31 HISTORY — DX: Malignant neoplasm of unspecified site of unspecified female breast: C50.919

## 2017-01-03 DIAGNOSIS — N390 Urinary tract infection, site not specified: Secondary | ICD-10-CM | POA: Diagnosis not present

## 2017-01-03 DIAGNOSIS — K130 Diseases of lips: Secondary | ICD-10-CM | POA: Diagnosis not present

## 2017-01-03 DIAGNOSIS — R6881 Early satiety: Secondary | ICD-10-CM | POA: Diagnosis not present

## 2017-01-03 DIAGNOSIS — I10 Essential (primary) hypertension: Secondary | ICD-10-CM | POA: Diagnosis not present

## 2017-01-03 DIAGNOSIS — R10812 Left upper quadrant abdominal tenderness: Secondary | ICD-10-CM | POA: Diagnosis not present

## 2017-01-03 DIAGNOSIS — Z952 Presence of prosthetic heart valve: Secondary | ICD-10-CM | POA: Diagnosis not present

## 2017-01-03 DIAGNOSIS — N3946 Mixed incontinence: Secondary | ICD-10-CM | POA: Diagnosis not present

## 2017-01-03 DIAGNOSIS — I509 Heart failure, unspecified: Secondary | ICD-10-CM | POA: Diagnosis not present

## 2017-01-03 DIAGNOSIS — I4891 Unspecified atrial fibrillation: Secondary | ICD-10-CM | POA: Diagnosis not present

## 2017-01-03 DIAGNOSIS — R350 Frequency of micturition: Secondary | ICD-10-CM | POA: Diagnosis not present

## 2017-01-16 DIAGNOSIS — H401132 Primary open-angle glaucoma, bilateral, moderate stage: Secondary | ICD-10-CM | POA: Diagnosis not present

## 2017-01-16 DIAGNOSIS — H5340 Unspecified visual field defects: Secondary | ICD-10-CM | POA: Diagnosis not present

## 2017-01-16 DIAGNOSIS — H401122 Primary open-angle glaucoma, left eye, moderate stage: Secondary | ICD-10-CM | POA: Diagnosis not present

## 2017-01-16 DIAGNOSIS — H401112 Primary open-angle glaucoma, right eye, moderate stage: Secondary | ICD-10-CM | POA: Diagnosis not present

## 2017-01-25 DIAGNOSIS — R309 Painful micturition, unspecified: Secondary | ICD-10-CM | POA: Diagnosis not present

## 2017-01-25 DIAGNOSIS — N390 Urinary tract infection, site not specified: Secondary | ICD-10-CM | POA: Diagnosis not present

## 2017-02-26 DIAGNOSIS — H5203 Hypermetropia, bilateral: Secondary | ICD-10-CM | POA: Diagnosis not present

## 2017-02-26 DIAGNOSIS — H52223 Regular astigmatism, bilateral: Secondary | ICD-10-CM | POA: Diagnosis not present

## 2017-02-26 DIAGNOSIS — H401112 Primary open-angle glaucoma, right eye, moderate stage: Secondary | ICD-10-CM | POA: Diagnosis not present

## 2017-02-26 DIAGNOSIS — H401122 Primary open-angle glaucoma, left eye, moderate stage: Secondary | ICD-10-CM | POA: Diagnosis not present

## 2017-02-26 DIAGNOSIS — H524 Presbyopia: Secondary | ICD-10-CM | POA: Diagnosis not present

## 2017-03-07 DIAGNOSIS — I4891 Unspecified atrial fibrillation: Secondary | ICD-10-CM | POA: Diagnosis not present

## 2017-03-07 DIAGNOSIS — N390 Urinary tract infection, site not specified: Secondary | ICD-10-CM | POA: Diagnosis not present

## 2017-03-07 DIAGNOSIS — N3946 Mixed incontinence: Secondary | ICD-10-CM | POA: Diagnosis not present

## 2017-03-07 DIAGNOSIS — G4700 Insomnia, unspecified: Secondary | ICD-10-CM | POA: Diagnosis not present

## 2017-03-07 DIAGNOSIS — Z79899 Other long term (current) drug therapy: Secondary | ICD-10-CM | POA: Diagnosis not present

## 2017-03-07 DIAGNOSIS — R6881 Early satiety: Secondary | ICD-10-CM | POA: Diagnosis not present

## 2017-03-07 DIAGNOSIS — I509 Heart failure, unspecified: Secondary | ICD-10-CM | POA: Diagnosis not present

## 2017-03-07 DIAGNOSIS — Z1389 Encounter for screening for other disorder: Secondary | ICD-10-CM | POA: Diagnosis not present

## 2017-03-07 DIAGNOSIS — Z0001 Encounter for general adult medical examination with abnormal findings: Secondary | ICD-10-CM | POA: Diagnosis not present

## 2017-03-07 DIAGNOSIS — R10812 Left upper quadrant abdominal tenderness: Secondary | ICD-10-CM | POA: Diagnosis not present

## 2017-03-07 DIAGNOSIS — Z952 Presence of prosthetic heart valve: Secondary | ICD-10-CM | POA: Diagnosis not present

## 2017-03-07 DIAGNOSIS — I10 Essential (primary) hypertension: Secondary | ICD-10-CM | POA: Diagnosis not present

## 2017-03-19 DIAGNOSIS — R350 Frequency of micturition: Secondary | ICD-10-CM | POA: Diagnosis not present

## 2017-04-02 DIAGNOSIS — R300 Dysuria: Secondary | ICD-10-CM | POA: Diagnosis not present

## 2017-04-04 DIAGNOSIS — D500 Iron deficiency anemia secondary to blood loss (chronic): Secondary | ICD-10-CM | POA: Diagnosis not present

## 2017-04-04 DIAGNOSIS — H409 Unspecified glaucoma: Secondary | ICD-10-CM | POA: Diagnosis not present

## 2017-04-04 DIAGNOSIS — C50919 Malignant neoplasm of unspecified site of unspecified female breast: Secondary | ICD-10-CM | POA: Diagnosis not present

## 2017-04-04 DIAGNOSIS — I4891 Unspecified atrial fibrillation: Secondary | ICD-10-CM | POA: Diagnosis not present

## 2017-04-04 DIAGNOSIS — I509 Heart failure, unspecified: Secondary | ICD-10-CM | POA: Diagnosis not present

## 2017-04-04 DIAGNOSIS — E782 Mixed hyperlipidemia: Secondary | ICD-10-CM | POA: Diagnosis not present

## 2017-04-04 DIAGNOSIS — I5033 Acute on chronic diastolic (congestive) heart failure: Secondary | ICD-10-CM | POA: Diagnosis not present

## 2017-04-04 DIAGNOSIS — I10 Essential (primary) hypertension: Secondary | ICD-10-CM | POA: Diagnosis not present

## 2017-04-12 DIAGNOSIS — H401131 Primary open-angle glaucoma, bilateral, mild stage: Secondary | ICD-10-CM | POA: Diagnosis not present

## 2017-04-23 ENCOUNTER — Telehealth: Payer: Self-pay | Admitting: Interventional Cardiology

## 2017-04-23 NOTE — Telephone Encounter (Signed)
Pt verbalized for the rn     Patient c/o Palpitations:  High priority if patient c/o lightheadedness, shortness of breath, or chest pain  How long have you had palpitations/irregular HR/ Afib? Are you having the symptoms now? Always  1) Are you currently experiencing lightheadedness, SOB or CP? no  2) Do you have a history of afib (atrial fibrillation) or irregular heart rhythm? yes  Have you checked your BP or HR? (document readings if available): HR  52  68  She dont know if her bp machine is right  3) Are you experiencing any other symptoms? that her heart is doing strange things

## 2017-04-23 NOTE — Telephone Encounter (Signed)
Attempted to return call. Hung up after several rings as no one or VM picked up to leave message. Held OV spot with Dr. Tamala Julian 10/18 at 1420 for when patient returns call.  Will try again later.

## 2017-04-24 ENCOUNTER — Telehealth: Payer: Self-pay | Admitting: Interventional Cardiology

## 2017-04-24 NOTE — Telephone Encounter (Signed)
Spoke with pt and she is very concerned that her "heart has been all over the place" since Sunday. States these palps are worse than what she is use to with her Afib.  BP has been low the last few days as well.  Pt very concerned and wishes to be seen.  Scheduled pt to see Dr. Tamala Julian 10/18 at 2:40pm.  Pt appreciative for assistance.

## 2017-04-24 NOTE — Telephone Encounter (Signed)
[name], Tara H at 04/24/2017 10:02 AM   Status: Signed    New message   Patient states she wants to know if medication is causing her BP to drop too low.  Pt c/o medication issue:  1. Name of Medication: atenolol (TENORMIN) 25 MG tablet, Combigan and Latanoprost  2. How are you currently taking this medication (dosage and times per day)? As prescribed  3. Are you having a reaction (difficulty breathing--STAT)? No            4. What is your medication issue? Patient concerned about low BP    1. What are your last 5 BP readings? 99/62 from 10/16  2. Are you having any other symptoms (ex. Dizziness, headache, blurred vision, passed out)? Dizziness on Sunday  3. What is your BP issue?  Thinks BP is too low, unable to sleep       Attempted to call.  Phone rang several times with no answer and no VM.  Will try again later.

## 2017-04-24 NOTE — Telephone Encounter (Signed)
See phone note from 10/16.  Combined calls.

## 2017-04-24 NOTE — Telephone Encounter (Signed)
New message   Patient states she wants to know if medication is causing her BP to drop too low.  Pt c/o medication issue:  1. Name of Medication: atenolol (TENORMIN) 25 MG tablet, Combigan and Latanoprost  2. How are you currently taking this medication (dosage and times per day)? As prescribed  3. Are you having a reaction (difficulty breathing--STAT)? No   4. What is your medication issue? Patient concerned about low BP    1. What are your last 5 BP readings? 99/62 from 10/16  2. Are you having any other symptoms (ex. Dizziness, headache, blurred vision, passed out)? Dizziness on Sunday  3. What is your BP issue?  Thinks BP is too low, unable to sleep

## 2017-04-25 ENCOUNTER — Encounter: Payer: Self-pay | Admitting: Interventional Cardiology

## 2017-04-25 ENCOUNTER — Ambulatory Visit (INDEPENDENT_AMBULATORY_CARE_PROVIDER_SITE_OTHER): Payer: Medicare Other | Admitting: Interventional Cardiology

## 2017-04-25 VITALS — BP 126/68 | HR 60 | Ht 63.0 in | Wt 128.6 lb

## 2017-04-25 DIAGNOSIS — I482 Chronic atrial fibrillation, unspecified: Secondary | ICD-10-CM

## 2017-04-25 DIAGNOSIS — R0602 Shortness of breath: Secondary | ICD-10-CM | POA: Diagnosis not present

## 2017-04-25 DIAGNOSIS — Z5181 Encounter for therapeutic drug level monitoring: Secondary | ICD-10-CM | POA: Diagnosis not present

## 2017-04-25 DIAGNOSIS — Z7901 Long term (current) use of anticoagulants: Secondary | ICD-10-CM | POA: Diagnosis not present

## 2017-04-25 DIAGNOSIS — I059 Rheumatic mitral valve disease, unspecified: Secondary | ICD-10-CM

## 2017-04-25 MED ORDER — ATENOLOL 25 MG PO TABS: 12.5000 mg | ORAL_TABLET | Freq: Every day | ORAL | 3 refills | Status: DC

## 2017-04-25 NOTE — Patient Instructions (Signed)
Medication Instructions:  1) DECREASE Atenolol to 12.5mg  once daily  Labwork: None  Testing/Procedures: None  Follow-Up: Your physician recommends that you schedule a follow-up appointment in: 10-20 days with a PA or NP.    Any Other Special Instructions Will Be Listed Below (If Applicable).     If you need a refill on your cardiac medications before your next appointment, please call your pharmacy.

## 2017-04-25 NOTE — Progress Notes (Signed)
Cardiology Office Note    Date:  04/25/2017   ID:  [name], DOB Mar 22, 1926, MRN [id number]  PCP:  Tara Huddle, MD  Cardiologist: Tara Grooms, MD   Chief Complaint  Patient presents with  . Cardiac Valve Problem    History of Present Illness:  [name] is a 83 y.o. female for mitral valve repair, persistent atrial fibrillation, diastolic heart failure, and chronic anticoagulation therapy.  Since her eyedrops have been changed, she states her energy level has been decreased. She has some mild shortness of breath. She denies lower extremity swelling. There is no orthopnea or PND. She denies claudication. No transient neurological complaints. No bleeding on chronic anticoagulation.   Past Medical History:  Diagnosis Date  . Anxiety   . Arthritis    Some in neck and back, but "can't complain"  . Atrial fibrillation ([place])    s/p MAZE 2010  . Atypical atrial flutter ([place])   . Breast cancer ([place])   . Cancer [facility])    1998 Right breast cancer with lumpectomy and radiation therapy  . Complication of anesthesia    Nausea from versed  . GERD (gastroesophageal reflux disease)   . Glaucoma   . Hiatal hernia   . History of mitral valve repair    Mitral valve prolapse, surgery in 2010  . Normocytic anemia 03/21/2014  . Personal history of radiation therapy   . Rheumatic fever    age 44    Past Surgical History:  Procedure Laterality Date  . ABDOMINAL HYSTERECTOMY    . APPENDECTOMY    . BACK SURGERY     Synovial cyst on spine, removed laproscopically in 2000?  [name] BREAST BIOPSY    . BREAST LUMPECTOMY     right 1998  . BREAST SURGERY     1998 Lumpectomy in right breast  . CARDIOVERSION  04/29/2012   Procedure: CARDIOVERSION;  Surgeon: [name], MD;  Location: [facility] ENDOSCOPY;  Service: Cardiovascular;  Laterality: N/A;  . CARDIOVERSION  06/19/2012   Procedure: CARDIOVERSION;  Surgeon: Tara Grooms, MD;  Location: [facility] ENDOSCOPY;  Service: Cardiovascular;   Laterality: N/A;  h/p from 11/8 in file drawer/dl  . CATARACT EXTRACTION    . CESAREAN SECTION     Three C-Sections  . EYE SURGERY     Cataract Surgery, both eyes  . KIDNEY CYST REMOVAL    . MITRAL VALVE REPAIR  June 2010  . TEE WITHOUT CARDIOVERSION  04/29/2012   Procedure: TRANSESOPHAGEAL ECHOCARDIOGRAM (TEE);  Surgeon: [name], MD;  Location: [facility] ENDOSCOPY;  Service: Cardiovascular;  Laterality: N/A;  . TONSILLECTOMY      Current Medications: Outpatient Medications Prior to Visit  Medication Sig Dispense Refill  . acetaminophen (TYLENOL) 500 MG tablet Take 500 mg by mouth every 6 (six) hours as needed for mild pain.    . Ascorbic Acid (VITAMIN C) 1000 MG tablet Take 1,000 mg by mouth daily.    [name] CHIA SEED PO Take 5 mLs by mouth daily.     . cholecalciferol (VITAMIN D) 1000 UNITS tablet Take 1,000 Units by mouth daily.    . Coenzyme Q10 150 MG CAPS Take by mouth 2 times daily at 12 noon and 4 pm.    . Cyanocobalamin (VITAMIN B-12) 1000 MCG SUBL Place 1,000 mcg under the tongue daily.     [name] ELIQUIS 2.5 MG TABS tablet TAKE 1 TABLET TWICE DAILY. 60 tablet 11  . Flaxseed, Linseed, (FLAX SEEDS  PO) Take 1 capsule by mouth daily.    . LevOCARNitine (L-CARNITINE) 500 MG TABS Take 500 mg by mouth daily.    [name] LORazepam (ATIVAN) 0.5 MG tablet Take 0.5 mg by mouth 2 (two) times daily.    . magnesium 30 MG tablet Take 30 mg by mouth daily.    . Omega-3 Fatty Acids (FISH OIL) 1000 MG CAPS Take 2,000 mg by mouth daily.    [name] OVER THE COUNTER MEDICATION Take 1 each by mouth daily. Hemp heart seeds    . OVER THE COUNTER MEDICATION Take 1,250 mg by mouth daily. "Omega-Q squid supplement    . pantoprazole (PROTONIX) 20 MG tablet Take 1 tablet (20 mg total) by mouth daily. 30 tablet 0  . PAPAYA PO Take 1 capsule by mouth daily.    . polyethylene glycol (MIRALAX / GLYCOLAX) packet Take 17 g by mouth daily as needed for mild constipation.    . polyvinyl alcohol (LIQUIFILM TEARS) 1.4 % ophthalmic  solution Place 1 drop into both eyes as needed. For dry eyes    . Probiotic Product (PROBIOTIC DAILY PO) Take by mouth daily. 10 Billi CFU    . RESVERATROL PO Take 1 tablet by mouth daily as needed (supplement).    . Taurine 500 MG CAPS Take 500 mg by mouth daily.    . timolol (TIMOPTIC) 0.5 % ophthalmic solution Place 1 drop into the right eye 2 (two) times daily.    [name] atenolol (TENORMIN) 25 MG tablet TAKE 1 TABLET ONCE DAILY. 90 tablet 3   No facility-administered medications prior to visit.      Allergies:   Amiodarone; Amoxicillin; Amoxicillin-pot clavulanate; Aspirin; Ciprofloxacin; Codeine; Diuretic [buchu-cornsilk-ch grass-hydran]; Iohexol; Lisinopril; Midazolam hcl; Quinolones; Sotalol; Sulfa antibiotics; Thioxanthenes; Valium; Vancomycin; and Versed [midazolam]   Social History   Social History  . Marital status: Widowed    Spouse name: N/A  . Number of children: N/A  . Years of education: N/A   Social History Main Topics  . Smoking status: Never Smoker  . Smokeless tobacco: Never Used  . Alcohol use No  . Drug use: No  . Sexual activity: No     Comment: Smoked one cigarette a day as a teen   Other Topics Concern  . None   Social History Narrative   Pt lives in [place] alone.  Homemaker.  Widowed     Family History:  The patient's family history includes Other in her sister.   ROS:   Please see the history of present illness.    Easy bruising, vision disturbance, irregular heartbeat, and excessive fatigue.  All other systems reviewed and are negative.   PHYSICAL EXAM:   VS:  BP 126/68 (BP Location: Left Arm)   Pulse 60   Ht 5\' 3"  (1.6 m)   Wt 128 lb 9.6 oz (58.3 kg)   BMI 22.78 kg/m    GEN: Well nourished, well developed, in no acute distress  HEENT: normal  Neck: no JVD, carotid bruits, or masses Cardiac: IIRR; no murmurs, rubs, or gallops,no edema  Respiratory:  clear to auscultation bilaterally, normal work of breathing GI: soft, nontender,  nondistended, + BS MS: no deformity or atrophy  Skin: warm and dry, no rash Neuro:  Alert and Oriented x 3, Strength and sensation are intact Psych: euthymic mood, full affect  Wt Readings from Last 3 Encounters:  04/25/17 128 lb 9.6 oz (58.3 kg)  07/19/16 139 lb 12.8 oz (63.4 kg)  12/29/15 135 lb (61.2 kg)  Studies/Labs Reviewed:   EKG:  EKG  Atrial fibrillation with slow ventricular response and heart rate of 55-60 bpm. Nonspecific ST abnormality, poor R-wave progression, and when compared to the most recent prior EKG from June 2017 the heart rate is slightly slower.  Recent Labs: 11/17/2016: ALT 21; BUN 13; Creatinine, Ser 0.58; Hemoglobin 12.8; Platelets 143; Potassium 3.8; Sodium 133   Lipid Panel No results found for: CHOL, TRIG, HDL, CHOLHDL, VLDL, LDLCALC, LDLDIRECT  Additional studies/ records that were reviewed today include:      ASSESSMENT:    1. Mitral valve disease   2. Chronic atrial fibrillation (HCC)   3. Anticoagulation goal of INR 2 to 3   4. SOB (shortness of breath) on exertion      PLAN:  In order of problems listed above:  1. By auscultation, no evidence of mitral regurgitation is noted. Valve status is stable. 2. Ventricular response is slow. Decrease atenolol to 12.5 mg daily. Return in 2 weeks and if heart rate is still less than 80 beats per minute could likely discontinue atenolol altogether. The concern is that timolol in her eyedrops is contributing to bradycardia caused by atenolol. 3. Continue current medical regimen with Apixaban. 4. Possibly related to bradycardia. We'll see how she feels when she returns on lower dose atenolol.  Return in 2 weeks and consider having atenolol completely discontinued if heart rate is well controlled on 12.5 mg, i.e. ventricular rate less than 80 bpm. If atenolol was discontinued, a 24-hour Holter monitor should be done one week after the last dose of atenolol.  Medication Adjustments/Labs and Tests  Ordered: Current medicines are reviewed at length with the patient today.  Concerns regarding medicines are outlined above.  Medication changes, Labs and Tests ordered today are listed in the Patient Instructions below. Patient Instructions  Medication Instructions:  1) DECREASE Atenolol to 12.5mg  once daily  Labwork: None  Testing/Procedures: None  Follow-Up: Your physician recommends that you schedule a follow-up appointment in: 10-20 days with a PA or NP.    Any Other Special Instructions Will Be Listed Below (If Applicable).     If you need a refill on your cardiac medications before your next appointment, please call your pharmacy.      Signed, Tara Grooms, MD  04/25/2017 6:15 PM    [facility] [place], [place], [place]  [address] Phone: [phone]; Fax: [phone]

## 2017-05-03 DIAGNOSIS — R300 Dysuria: Secondary | ICD-10-CM | POA: Diagnosis not present

## 2017-05-07 DIAGNOSIS — H409 Unspecified glaucoma: Secondary | ICD-10-CM | POA: Diagnosis not present

## 2017-05-07 DIAGNOSIS — I5033 Acute on chronic diastolic (congestive) heart failure: Secondary | ICD-10-CM | POA: Diagnosis not present

## 2017-05-07 DIAGNOSIS — I4891 Unspecified atrial fibrillation: Secondary | ICD-10-CM | POA: Diagnosis not present

## 2017-05-07 DIAGNOSIS — I509 Heart failure, unspecified: Secondary | ICD-10-CM | POA: Diagnosis not present

## 2017-05-07 DIAGNOSIS — C50919 Malignant neoplasm of unspecified site of unspecified female breast: Secondary | ICD-10-CM | POA: Diagnosis not present

## 2017-05-07 DIAGNOSIS — I10 Essential (primary) hypertension: Secondary | ICD-10-CM | POA: Diagnosis not present

## 2017-05-07 DIAGNOSIS — D500 Iron deficiency anemia secondary to blood loss (chronic): Secondary | ICD-10-CM | POA: Diagnosis not present

## 2017-05-07 DIAGNOSIS — E782 Mixed hyperlipidemia: Secondary | ICD-10-CM | POA: Diagnosis not present

## 2017-05-16 ENCOUNTER — Encounter: Payer: Self-pay | Admitting: Physician Assistant

## 2017-05-16 DIAGNOSIS — I4892 Unspecified atrial flutter: Secondary | ICD-10-CM | POA: Insufficient documentation

## 2017-05-16 DIAGNOSIS — R5383 Other fatigue: Secondary | ICD-10-CM | POA: Insufficient documentation

## 2017-05-16 NOTE — Progress Notes (Signed)
Cardiology Office Note    Date:  05/17/2017  ID:  [name], DOB [birth year], MRN [id number] PCP:  Tara Huddle, MD  Cardiologist:  Dr. Tamala [name]   Chief Complaint: f/u fatigue  History of Present Illness:  [name] is a 83 y.o. female with history of mitral valve prolapse s/p mitral valve repair 2010, prior rheumatic fever, persistent atrial fib/flutter, chronic diastolic CHF (per Dr. Thompson [name] note), arthritis, remote breast CA, chronic-appearing hyponatremia/thrombocytopenia, anemia who presents for f/u. She saw Dr. Tamala [name] 04/25/17 at which she reported decreased energy level since eyedrops had changed. HR was 55-60 at that visit thus there was concern that timolol eye drops were contributing to bradycardia and fatigue. Her atenolol was decreased to 12.5mg . Per Dr. Tamala [name], "Return in 2 weeks and consider having atenolol completely discontinued if heart rate is well controlled on 12.5 mg, i.e. ventricular rate less than 80 bpm. If atenolol was discontinued, a 24-hour Holter monitor should be done one week after the last dose of atenolol." Last labs 11/2016 showed K 3.8, Cr 0.58, Na 133, Hgb 12.8, Plt 143. Other studies reviewed include last echo 2014 showed MVR with ring and mild MR, mild LAE, mild LVH, EF 50-55%, mild TR. Prior monitor 08/2015 showed continuous atrial flutter. She had DCCV in 04/2012 for a-flutter and 06/2012 for a-fib. Remote cath 2010 negative for CAD.  She returns for follow-up with her son today. They are originally from [place]. Overall she is feeling better. She actually elected to discontinue her latanoprost because she felt it may be also causing fatigue and dyspnea. She has felt better since doing so. Her dyspnea has resolved, but still with mild fatigue at times. No major bleeding except rare short-lived episodes of epistaxis. She brings in a log of her blood pressures and HR, with range of BP from 97/60 through 134/69. Majority of BPs appear to be 696-789 systolic range, with HR  in ranging from 46-90 but average appearing around 65-70. [facility] Did have a fall in the last year but this was prior to above med changes.   Past Medical History:  Diagnosis Date  . Anxiety   . Arthritis    Some in neck and back, but "can't complain"  . Atypical atrial flutter ([name])   . Breast cancer ([place])   . Cancer [facility])    1998 Right breast cancer with lumpectomy and radiation therapy  . Chronic diastolic CHF (congestive heart failure) ([place])   . Complication of anesthesia    Nausea from versed  . GERD (gastroesophageal reflux disease)   . Glaucoma   . Hiatal hernia   . History of mitral valve repair    Mitral valve prolapse, surgery in 2010  . Hyponatremia   . Normocytic anemia 03/21/2014  . Persistent atrial fibrillation ([name])    s/p MAZE 2010  . Personal history of radiation therapy   . Rheumatic fever    age 83  . Thrombocytopenia ([name])     Past Surgical History:  Procedure Laterality Date  . ABDOMINAL HYSTERECTOMY    . APPENDECTOMY    . BACK SURGERY     Synovial cyst on spine, removed laproscopically in 2000?  [name] BREAST BIOPSY    . BREAST LUMPECTOMY     right 1998  . BREAST SURGERY     1998 Lumpectomy in right breast  . CATARACT EXTRACTION    . CESAREAN SECTION     Three C-Sections  . EYE SURGERY  Cataract Surgery, both eyes  . KIDNEY CYST REMOVAL    . MITRAL VALVE REPAIR  June 2010  . TONSILLECTOMY      Current Medications: Current Meds  Medication Sig  . acetaminophen (TYLENOL) 500 MG tablet Take 500 mg by mouth every 6 (six) hours as needed for mild pain.  . Ascorbic Acid (VITAMIN C) 1000 MG tablet Take 1,000 mg by mouth daily.  [name] atenolol (TENORMIN) 25 MG tablet Take 0.5 tablets (12.5 mg total) by mouth daily.  [name] CHIA SEED PO Take 5 mLs by mouth daily.   . cholecalciferol (VITAMIN D) 1000 UNITS tablet Take 1,000 Units by mouth daily.  . Coenzyme Q10 150 MG CAPS Take by mouth 2 times daily at 12 noon and 4 pm.  . COMBIGAN  0.2-0.5 % ophthalmic solution Place 1 drop into both eyes every morning.  . Cyanocobalamin (VITAMIN B-12) 1000 MCG SUBL Place 1,000 mcg under the tongue daily.   [name] ELIQUIS 2.5 MG TABS tablet TAKE 1 TABLET TWICE DAILY.  [name] Flaxseed, Linseed, (FLAX SEEDS PO) Take 1 capsule by mouth daily.  [name] latanoprost (XALATAN) 0.005 % ophthalmic solution Place 1 drop into both eyes every evening.  . LevOCARNitine (L-CARNITINE) 500 MG TABS Take 500 mg by mouth daily.  . magnesium 30 MG tablet Take 30 mg by mouth daily.  . Omega-3 Fatty Acids (FISH OIL) 1000 MG CAPS Take 2,000 mg by mouth daily.  [name] OVER THE COUNTER MEDICATION Take 1 each by mouth daily. Hemp heart seeds  . OVER THE COUNTER MEDICATION Take 1,250 mg by mouth daily. "Omega-Q squid supplement  . pantoprazole (PROTONIX) 20 MG tablet Take 1 tablet (20 mg total) by mouth daily.  [name] PAPAYA PO Take 1 capsule by mouth daily.  . polyethylene glycol (MIRALAX / GLYCOLAX) packet Take 17 g by mouth daily as needed for mild constipation.  . Probiotic Product (PROBIOTIC DAILY PO) Take by mouth daily. 10 Billi CFU  . Taurine 500 MG CAPS Take 500 mg by mouth daily.  . timolol (TIMOPTIC) 0.5 % ophthalmic solution Place 1 drop into the right eye 2 (two) times daily.     Allergies:   Amiodarone; Amoxicillin; Amoxicillin-pot clavulanate; Aspirin; Ciprofloxacin; Codeine; Diuretic [buchu-cornsilk-ch grass-hydran]; Iohexol; Lisinopril; Midazolam hcl; Quinolones; Sotalol; Sulfa antibiotics; Thioxanthenes; Valium; Vancomycin; and Versed [midazolam]   Social History   Socioeconomic History  . Marital status: Widowed    Spouse name: None  . Number of children: None  . Years of education: None  . Highest education level: None  Social Needs  . Financial resource strain: None  . Food insecurity - worry: None  . Food insecurity - inability: None  . Transportation needs - medical: None  . Transportation needs - non-medical: None  Occupational History  . None  Tobacco  Use  . Smoking status: Never Smoker  . Smokeless tobacco: Never Used  Substance and Sexual Activity  . Alcohol use: No  . Drug use: No  . Sexual activity: No    Comment: Smoked one cigarette a day as a teen  Other Topics Concern  . None  Social History Narrative   Pt lives in [place] alone.  Homemaker.  Widowed     Family History:  Family History  Problem Relation Age of Onset  . Other Sister     ROS:   Please see the history of present illness.  All other systems are reviewed and otherwise negative.    PHYSICAL EXAM:   VS:  BP 126/90  Pulse 75   Ht 5\' 3"  (1.6 m)   Wt 129 lb 6.4 oz (58.7 kg)   BMI 22.92 kg/m   BMI: Body mass index is 22.92 kg/m. GEN: Well nourished, well developed elderly WF, in no acute distress, lively HEENT: normocephalic, atraumatic Neck: no JVD, carotid bruits, or masses Cardiac: irregularly irregular, rate controlled; no murmurs, rubs, or gallops, no edema  Respiratory:  clear to auscultation bilaterally, normal work of breathing GI: soft, nontender, nondistended, + BS MS: no deformity or atrophy  Skin: warm and dry, no rash Neuro:  Alert and Oriented x 3, Strength and sensation are intact, follows commands Psych: euthymic mood, full affect  Wt Readings from Last 3 Encounters:  05/17/17 129 lb 6.4 oz (58.7 kg)  04/25/17 128 lb 9.6 oz (58.3 kg)  07/19/16 139 lb 12.8 oz (63.4 kg)      Studies/Labs Reviewed:   EKG:  EKG was ordered today and personally reviewed by me and demonstrates atrial fib 65bpm, prior septal infarct, poor R wave progression, no acute ST-T changes  Recent Labs: 11/17/2016: ALT 21; BUN 13; Creatinine, Ser 0.58; Hemoglobin 12.8; Platelets 143; Potassium 3.8; Sodium 133   Lipid Panel No results found for: CHOL, TRIG, HDL, CHOLHDL, VLDL, LDLCALC, LDLDIRECT  Additional studies/ records that were reviewed today include: Summarized above.  ASSESSMENT & PLAN:   1. Persistent atrial fib/flutter - appears that Dr.  Tamala [name] has managed this with rate control strategy because she has tolerated this well in the last few years. She is feeling better with decrease in atenolol and discontinuation of latanoprost. She plans to discuss her eye drops with her [facility] team. She does have mild residual fatigue and has noticed episodic lower BPs. Per Dr. Thompson [name] recommendation, as HR is generally less than 80, will discontinue atenolol completely and obtain 24-hour Holter in approximately 1 week. She also keeps diligent records of blood pressure and HR so will let us know if she begins to have any issues with rapid response. If she does, it might be worthwhile to consider initiation of Toprol 12.5mg  daily as a middle ground given that it is less potent than atenolol, particularly in elderly population. 2. Fatigue/dyspnea - as above. Dyspnea improved. Mild fatigue persists. For completeness will update BMET, CBC, TSH. Bleeding precautions reviewed. I told her if she continues to have episodic epistaxis, would recommend she see ENT for cautery. She does not see this as a frequent or persistent issue at this time. 3. S/p mitral valve repair - exam benign. Volume status stable. Last echo 2014. If any further issues arise could consider updating echo. 4. Chronic diastolic CHF - appears euvolemic; does not appear to require any standing diuretic at this time.  Disposition: F/u with Dr. Tamala [name] in 3 months.  Medication Adjustments/Labs and Tests Ordered: Current medicines are reviewed at length with the patient today.  Concerns regarding medicines are outlined above. Medication changes, Labs and Tests ordered today are summarized above and listed in the Patient Instructions accessible in Encounters.   Signed, Charlie Pitter, PA-C  05/17/2017 9:14 AM    [facility] [address], [place], [place]  [address] Phone: [phone]; Fax: [phone]

## 2017-05-17 ENCOUNTER — Encounter: Payer: Self-pay | Admitting: Physician Assistant

## 2017-05-17 ENCOUNTER — Ambulatory Visit (INDEPENDENT_AMBULATORY_CARE_PROVIDER_SITE_OTHER): Payer: Medicare Other | Admitting: Physician Assistant

## 2017-05-17 VITALS — BP 126/90 | HR 75 | Ht 63.0 in | Wt 129.4 lb

## 2017-05-17 DIAGNOSIS — Z9889 Other specified postprocedural states: Secondary | ICD-10-CM | POA: Diagnosis not present

## 2017-05-17 DIAGNOSIS — I481 Persistent atrial fibrillation: Secondary | ICD-10-CM | POA: Diagnosis not present

## 2017-05-17 DIAGNOSIS — I4892 Unspecified atrial flutter: Secondary | ICD-10-CM

## 2017-05-17 DIAGNOSIS — I5032 Chronic diastolic (congestive) heart failure: Secondary | ICD-10-CM | POA: Diagnosis not present

## 2017-05-17 DIAGNOSIS — R5383 Other fatigue: Secondary | ICD-10-CM

## 2017-05-17 DIAGNOSIS — I4819 Other persistent atrial fibrillation: Secondary | ICD-10-CM

## 2017-05-17 LAB — CBC
Hematocrit: 38 % (ref 34.0–46.6)
Hemoglobin: 13.1 g/dL (ref 11.1–15.9)
MCH: 34.7 pg — ABNORMAL HIGH (ref 26.6–33.0)
MCHC: 34.5 g/dL (ref 31.5–35.7)
MCV: 101 fL — ABNORMAL HIGH (ref 79–97)
Platelets: 165 10*3/uL (ref 150–379)
RBC: 3.77 x10E6/uL (ref 3.77–5.28)
RDW: 13.3 % (ref 12.3–15.4)
WBC: 5.9 10*3/uL (ref 3.4–10.8)

## 2017-05-17 LAB — BASIC METABOLIC PANEL
BUN/Creatinine Ratio: 22 (ref 12–28)
BUN: 15 mg/dL (ref 8–27)
CO2: 27 mmol/L (ref 20–29)
Calcium: 9.7 mg/dL (ref 8.7–10.3)
Chloride: 99 mmol/L (ref 96–106)
Creatinine, Ser: 0.67 mg/dL (ref 0.57–1.00)
GFR calc Af Amer: 90 mL/min/{1.73_m2} (ref >59)
GFR calc non Af Amer: 78 mL/min/{1.73_m2} (ref >59)
Glucose: 95 mg/dL (ref 65–99)
Potassium: 4.6 mmol/L (ref 3.5–5.2)
Sodium: 141 mmol/L (ref 134–144)

## 2017-05-17 LAB — TSH: TSH: 3.68 u[IU]/mL (ref 0.450–4.500)

## 2017-05-17 NOTE — Patient Instructions (Addendum)
Medication Instructions:  Your physician has recommended you make the following change in your medication:  STOP the Atenolol  Labwork: TODAY:  BMET, CBC, & TSH   Testing/Procedures: Your physician has recommended that you wear a holter monitor 1 WEEK FROM TODAY (AFTER STOPPING  MEDICATION)  Holter monitors are medical devices that record the heart's electrical activity. Doctors most often use these monitors to diagnose arrhythmias. Arrhythmias are problems with the speed or rhythm of the heartbeat. The monitor is a small, portable device. You can wear one while you do your normal daily activities. This is usually used to diagnose what is causing palpitations/syncope (passing out).    Follow-Up: Your physician recommends that you schedule a follow-up appointment in: [place]   Any Other Special Instructions Will Be Listed Below (If Applicable).   Holter Monitoring A Holter monitor is a small device that is used to detect abnormal heart rhythms. It clips to your clothing and is connected by wires to flat, sticky disks (electrodes) that attach to your chest. It is worn continuously for 24-48 hours. Follow these instructions at home:  Wear your Holter monitor at all times, even while exercising and sleeping, for as long as directed by your health care provider.  Make sure that the Holter monitor is safely clipped to your clothing or close to your body as recommended by your health care provider.  Do not get the monitor or wires wet.  Do not put body lotion or moisturizer on your chest.  Keep your skin clean.  Keep a diary of your daily activities, such as walking and doing chores. If you feel that your heartbeat is abnormal or that your heart is fluttering or skipping a beat: ? Record what you are doing when it happens. ? Record what time of day the symptoms occur.  Return your Holter monitor as directed by your health care provider.  Keep all follow-up visits as  directed by your health care provider. This is important. Get help right away if:  You feel lightheaded or you faint.  You have trouble breathing.  You feel pain in your chest, upper arm, or jaw.  You feel sick to your stomach and your skin is pale, cool, or damp.  You heartbeat feels unusual or abnormal. This information is not intended to replace advice given to you by your health care provider. Make sure you discuss any questions you have with your health care provider. Document Released: 03/23/2004 Document Revised: 12/01/2015 Document Reviewed: 02/01/2014 Elsevier Interactive Patient Education  [employer].    If you need a refill on your cardiac medications before your next appointment, please call your pharmacy.

## 2017-05-20 NOTE — Addendum Note (Signed)
Addended by: Gaetano Net on: 05/20/2017 11:52 AM   Modules accepted: Orders

## 2017-05-22 DIAGNOSIS — H401112 Primary open-angle glaucoma, right eye, moderate stage: Secondary | ICD-10-CM | POA: Diagnosis not present

## 2017-05-24 ENCOUNTER — Ambulatory Visit (INDEPENDENT_AMBULATORY_CARE_PROVIDER_SITE_OTHER): Payer: Medicare Other

## 2017-05-24 DIAGNOSIS — I481 Persistent atrial fibrillation: Secondary | ICD-10-CM

## 2017-05-24 DIAGNOSIS — I4819 Other persistent atrial fibrillation: Secondary | ICD-10-CM

## 2017-05-27 DIAGNOSIS — R300 Dysuria: Secondary | ICD-10-CM | POA: Diagnosis not present

## 2017-05-27 DIAGNOSIS — B370 Candidal stomatitis: Secondary | ICD-10-CM | POA: Diagnosis not present

## 2017-05-27 DIAGNOSIS — Z23 Encounter for immunization: Secondary | ICD-10-CM | POA: Diagnosis not present

## 2017-06-06 DIAGNOSIS — I509 Heart failure, unspecified: Secondary | ICD-10-CM | POA: Diagnosis not present

## 2017-06-06 DIAGNOSIS — I5033 Acute on chronic diastolic (congestive) heart failure: Secondary | ICD-10-CM | POA: Diagnosis not present

## 2017-06-06 DIAGNOSIS — I4891 Unspecified atrial fibrillation: Secondary | ICD-10-CM | POA: Diagnosis not present

## 2017-06-06 DIAGNOSIS — H409 Unspecified glaucoma: Secondary | ICD-10-CM | POA: Diagnosis not present

## 2017-06-06 DIAGNOSIS — C50919 Malignant neoplasm of unspecified site of unspecified female breast: Secondary | ICD-10-CM | POA: Diagnosis not present

## 2017-06-06 DIAGNOSIS — D500 Iron deficiency anemia secondary to blood loss (chronic): Secondary | ICD-10-CM | POA: Diagnosis not present

## 2017-06-06 DIAGNOSIS — E782 Mixed hyperlipidemia: Secondary | ICD-10-CM | POA: Diagnosis not present

## 2017-06-06 DIAGNOSIS — I10 Essential (primary) hypertension: Secondary | ICD-10-CM | POA: Diagnosis not present

## 2017-06-13 DIAGNOSIS — R300 Dysuria: Secondary | ICD-10-CM | POA: Diagnosis not present

## 2017-06-20 DIAGNOSIS — F419 Anxiety disorder, unspecified: Secondary | ICD-10-CM | POA: Diagnosis not present

## 2017-06-20 DIAGNOSIS — R300 Dysuria: Secondary | ICD-10-CM | POA: Diagnosis not present

## 2017-08-05 DIAGNOSIS — N390 Urinary tract infection, site not specified: Secondary | ICD-10-CM | POA: Diagnosis not present

## 2017-08-19 NOTE — Progress Notes (Signed)
Cardiology Office Note    Date:  08/20/2017   ID:  [name], DOB 08-17-1925, MRN [id number]  PCP:  Josetta Huddle, MD  Cardiologist: Sinclair Grooms, MD   Chief Complaint  Patient presents with  . Atrial Fibrillation  . Cardiac Valve Problem    History of Present Illness:  [name] is a 84 y.o. female  for mitral valve repair, persistent atrial fibrillation, diastolic heart failure, and chronic anticoagulation therapy.        Here for the yearly appointment.  Feels well but noted to have high heart rate on check in.  Within the past 12 months she complained of excessive fatigue and beta-blocker therapy was completely weaned and discontinued.  She denies orthopnea, PND, syncope, edema, and transient neurological symptoms.   Past Medical History:  Diagnosis Date  . Anxiety   . Arthritis    Some in neck and back, but "can't complain"  . Atypical atrial flutter ([place])   . Breast cancer ([place])   . Cancer [facility])    1998 Right breast cancer with lumpectomy and radiation therapy  . Chronic diastolic CHF (congestive heart failure) ([place])   . Complication of anesthesia    Nausea from versed  . GERD (gastroesophageal reflux disease)   . Glaucoma   . Hiatal hernia   . History of mitral valve repair    Mitral valve prolapse, surgery in 2010  . Hyponatremia   . Normocytic anemia 03/21/2014  . Persistent atrial fibrillation ([place])    s/p MAZE 2010  . Personal history of radiation therapy   . Rheumatic fever    age 78  . Thrombocytopenia ([place])     Past Surgical History:  Procedure Laterality Date  . ABDOMINAL HYSTERECTOMY    . APPENDECTOMY    . BACK SURGERY     Synovial cyst on spine, removed laproscopically in 2000?  [name] BREAST BIOPSY    . BREAST LUMPECTOMY     right 1998  . BREAST SURGERY     1998 Lumpectomy in right breast  . CARDIOVERSION  04/29/2012   Procedure: CARDIOVERSION;  Surgeon: [name], MD;  Location: [facility] ENDOSCOPY;  Service: Cardiovascular;   Laterality: N/A;  . CARDIOVERSION  06/19/2012   Procedure: CARDIOVERSION;  Surgeon: Sinclair Grooms, MD;  Location: [facility] ENDOSCOPY;  Service: Cardiovascular;  Laterality: N/A;  h/p from 11/8 in file drawer/dl  . CATARACT EXTRACTION    . CESAREAN SECTION     Three C-Sections  . EYE SURGERY     Cataract Surgery, both eyes  . KIDNEY CYST REMOVAL    . MITRAL VALVE REPAIR  June 2010  . TEE WITHOUT CARDIOVERSION  04/29/2012   Procedure: TRANSESOPHAGEAL ECHOCARDIOGRAM (TEE);  Surgeon: [name], MD;  Location: [facility] ENDOSCOPY;  Service: Cardiovascular;  Laterality: N/A;  . TONSILLECTOMY      Current Medications: Outpatient Medications Prior to Visit  Medication Sig Dispense Refill  . acetaminophen (TYLENOL) 500 MG tablet Take 500 mg by mouth every 6 (six) hours as needed for mild pain.    . Ascorbic Acid (VITAMIN C) 1000 MG tablet Take 1,000 mg by mouth daily.    [name] CHIA SEED PO Take 5 mLs by mouth daily.     . cholecalciferol (VITAMIN D) 1000 UNITS tablet Take 1,000 Units by mouth daily.    . Coenzyme Q10 150 MG CAPS Take by mouth 2 times daily at 12 noon and 4 pm.    . COMBIGAN 0.2-0.5 %  ophthalmic solution Place 1 drop into both eyes every morning.    . Cyanocobalamin (VITAMIN B-12) 1000 MCG SUBL Place 1,000 mcg under the tongue daily.     [name] ELIQUIS 2.5 MG TABS tablet TAKE 1 TABLET TWICE DAILY. 60 tablet 11  . Flaxseed, Linseed, (FLAX SEEDS PO) Take 1 capsule by mouth daily.    [name] latanoprost (XALATAN) 0.005 % ophthalmic solution Place 1 drop into both eyes every evening.    . LevOCARNitine (L-CARNITINE) 500 MG TABS Take 500 mg by mouth daily.    . magnesium 30 MG tablet Take 30 mg by mouth daily.    . Omega-3 Fatty Acids (FISH OIL) 1000 MG CAPS Take 2,000 mg by mouth daily.    [name] OVER THE COUNTER MEDICATION Take 1 each by mouth daily. Hemp heart seeds    . OVER THE COUNTER MEDICATION Take 1,250 mg by mouth daily. "Omega-Q squid supplement    . PAPAYA PO Take 1 capsule by mouth daily.      . polyethylene glycol (MIRALAX / GLYCOLAX) packet Take 17 g by mouth daily as needed for mild constipation.    . Probiotic Product (PROBIOTIC DAILY PO) Take 1 capsule by mouth daily. 10 Billi CFU     . Taurine 500 MG CAPS Take 500 mg by mouth daily.    . timolol (TIMOPTIC) 0.5 % ophthalmic solution Place 1 drop into the right eye 2 (two) times daily.    . pantoprazole (PROTONIX) 20 MG tablet Take 1 tablet (20 mg total) by mouth daily. (Patient not taking: Reported on 08/20/2017) 30 tablet 0   No facility-administered medications prior to visit.      Allergies:   Amiodarone; Amoxicillin; Amoxicillin-pot clavulanate; Aspirin; Ciprofloxacin; Codeine; Diuretic [buchu-cornsilk-ch grass-hydran]; Iohexol; Lisinopril; Midazolam hcl; Quinolones; Sotalol; Sulfa antibiotics; Thioxanthenes; Valium; Vancomycin; and Versed [midazolam]   Social History   Socioeconomic History  . Marital status: Widowed    Spouse name: None  . Number of children: None  . Years of education: None  . Highest education level: None  Social Needs  . Financial resource strain: None  . Food insecurity - worry: None  . Food insecurity - inability: None  . Transportation needs - medical: None  . Transportation needs - non-medical: None  Occupational History  . None  Tobacco Use  . Smoking status: Never Smoker  . Smokeless tobacco: Never Used  Substance and Sexual Activity  . Alcohol use: No  . Drug use: No  . Sexual activity: No    Comment: Smoked one cigarette a day as a teen  Other Topics Concern  . None  Social History Narrative   Pt lives in [place] alone.  Homemaker.  Widowed     Family History:  The patient's family history includes Other in her sister.   ROS:   Please see the history of present illness.    Vision disturbance and sometimes difficulty with balance.  Easy bruising.  No gross or severe bleeding. All other systems reviewed and are negative.   PHYSICAL EXAM:   VS:  BP (!) 120/92    Pulse (!) 121   Ht 5\' 3"  (1.6 m)   Wt 135 lb (61.2 kg)   BMI 23.91 kg/m    GEN: Well nourished, well developed, in no acute distress  HEENT: normal  Neck: no JVD, carotid bruits, or masses Cardiac: IRRR; no murmurs, rubs, or gallops,no edema  Respiratory:  clear to auscultation bilaterally, normal work of breathing GI: soft, nontender, nondistended, + BS  MS: no deformity or atrophy  Skin: warm and dry, no rash Neuro:  Alert and Oriented x 3, Strength and sensation are intact Psych: euthymic mood, full affect  Wt Readings from Last 3 Encounters:  08/20/17 135 lb (61.2 kg)  05/17/17 129 lb 6.4 oz (58.7 kg)  04/25/17 128 lb 9.6 oz (58.3 kg)      Studies/Labs Reviewed:   EKG:  EKG atrial flutter with rapid ventricular response with average heart rate 120 bpm.   Recent Labs: 11/17/2016: ALT 21 05/17/2017: BUN 15; Creatinine, Ser 0.67; Hemoglobin 13.1; Platelets 165; Potassium 4.6; Sodium 141; TSH 3.680   Lipid Panel No results found for: CHOL, TRIG, HDL, CHOLHDL, VLDL, LDLCALC, LDLDIRECT  Additional studies/ records that were reviewed today include:  No new data    ASSESSMENT:    1. Atrial flutter, unspecified type ([place])   2. Anticoagulation goal of INR 2 to 3   3. S/P mitral valve repair      PLAN:  In order of problems listed above:  1. Resume beta-blocker therapy in the form of Toprol XL 25 mg/day.  May need to further uptitrate on return in 1 week.  If shortness of breath, edema, lightheadedness, dizziness, please call. 2. Continue Eliquis to prevent stroke. 3. Valve function appears to be normal.  No significant murmurs heard.   She will follow-up in 1 week for recheck of heart rate.   Medication Adjustments/Labs and Tests Ordered: Current medicines are reviewed at length with the patient today.  Concerns regarding medicines are outlined above.  Medication changes, Labs and Tests ordered today are listed in the Patient Instructions below. Patient Instructions   Medication Instructions:  1) START Metoprolol Succinate 25mg  once daily  Labwork: None  Testing/Procedures: None  Follow-Up: Your physician recommends that you schedule a follow-up appointment in: 1 week with a PA or NP with an EKG.    Any Other Special Instructions Will Be Listed Below (If Applicable).     If you need a refill on your cardiac medications before your next appointment, please call your pharmacy.      Signed, Sinclair Grooms, MD  08/20/2017 2:25 PM    [facility] [address], [place], [place]  [address] Phone: [phone]; Fax: [phone]

## 2017-08-20 ENCOUNTER — Encounter: Payer: Self-pay | Admitting: Interventional Cardiology

## 2017-08-20 ENCOUNTER — Ambulatory Visit (INDEPENDENT_AMBULATORY_CARE_PROVIDER_SITE_OTHER): Payer: Medicare Other | Admitting: Interventional Cardiology

## 2017-08-20 VITALS — BP 120/92 | HR 121 | Ht 63.0 in | Wt 135.0 lb

## 2017-08-20 DIAGNOSIS — Z5181 Encounter for therapeutic drug level monitoring: Secondary | ICD-10-CM | POA: Diagnosis not present

## 2017-08-20 DIAGNOSIS — Z9889 Other specified postprocedural states: Secondary | ICD-10-CM | POA: Diagnosis not present

## 2017-08-20 DIAGNOSIS — I4892 Unspecified atrial flutter: Secondary | ICD-10-CM | POA: Diagnosis not present

## 2017-08-20 DIAGNOSIS — Z7901 Long term (current) use of anticoagulants: Secondary | ICD-10-CM | POA: Diagnosis not present

## 2017-08-20 MED ORDER — METOPROLOL SUCCINATE ER 25 MG PO TB24: 25.0000 mg | ORAL_TABLET | Freq: Every day | ORAL | 3 refills | Status: DC

## 2017-08-20 NOTE — Patient Instructions (Signed)
Medication Instructions:  1) START Metoprolol Succinate 25mg  once daily  Labwork: None  Testing/Procedures: None  Follow-Up: Your physician recommends that you schedule a follow-up appointment in: 1 week with a PA or NP with an EKG.    Any Other Special Instructions Will Be Listed Below (If Applicable).     If you need a refill on your cardiac medications before your next appointment, please call your pharmacy.

## 2017-08-27 NOTE — Progress Notes (Signed)
Cardiology Office Note    Date:  08/28/2017   ID:  [name], DOB 10/01/1925, MRN [id number]  PCP:  Josetta Huddle, MD  Cardiologist: Sinclair Grooms, MD   Chief Complaint  Patient presents with  . Atrial Fibrillation    History of Present Illness:  [name] is a 84 y.o. female  for mitral valve repair, persistent atrial fibrillation, diastolic heart failure, and chronic anticoagulation therapy.  Most recently noted to be in atrial flutter with rapid ventricular response.  Returns today for reassessment after medication adjustment.  Perhaps some increase in fatigue since starting Toprol-XL 25 mg/day.  Otherwise doing okay.  She and her son went to [facility] and according to her son was able to walk all over the store without significant problems.  She was tired when she got home.  She denies orthopnea, PND, and dyspnea.   Past Medical History:  Diagnosis Date  . Anxiety   . Arthritis    Some in neck and back, but "can't complain"  . Atypical atrial flutter ([place])   . Breast cancer ([place])   . Cancer [facility])    1998 Right breast cancer with lumpectomy and radiation therapy  . Chronic diastolic CHF (congestive heart failure) ([place])   . Complication of anesthesia    Nausea from versed  . GERD (gastroesophageal reflux disease)   . Glaucoma   . Hiatal hernia   . History of mitral valve repair    Mitral valve prolapse, surgery in 2010  . Hyponatremia   . Normocytic anemia 03/21/2014  . Persistent atrial fibrillation ([place])    s/p MAZE 2010  . Personal history of radiation therapy   . Rheumatic fever    age 4  . Thrombocytopenia ([place])     Past Surgical History:  Procedure Laterality Date  . ABDOMINAL HYSTERECTOMY    . APPENDECTOMY    . BACK SURGERY     Synovial cyst on spine, removed laproscopically in 2000?  [name] BREAST BIOPSY    . BREAST LUMPECTOMY     right 1998  . BREAST SURGERY     1998 Lumpectomy in right breast  . CARDIOVERSION  04/29/2012   Procedure:  CARDIOVERSION;  Surgeon: [name], MD;  Location: [place] ENDOSCOPY;  Service: Cardiovascular;  Laterality: N/A;  . CARDIOVERSION  06/19/2012   Procedure: CARDIOVERSION;  Surgeon: Sinclair Grooms, MD;  Location[facility] ENDOSCOPY;  Service: Cardiovascular;  Laterality: N/A;  h/p from 11/8 in file drawer/dl  . CATARACT EXTRACTION    . CESAREAN SECTION     Three C-Sections  . EYE SURGERY     Cataract Surgery, both eyes  . KIDNEY CYST REMOVAL    . MITRAL VALVE REPAIR  June 2010  . TEE WITHOUT CARDIOVERSION  04/29/2012   Procedure: TRANSESOPHAGEAL ECHOCARDIOGRAM (TEE);  Surgeon: [name], MD;  Location: [facility] ENDOSCOPY;  Service: Cardiovascular;  Laterality: N/A;  . TONSILLECTOMY      Current Medications: Outpatient Medications Prior to Visit  Medication Sig Dispense Refill  . acetaminophen (TYLENOL) 500 MG tablet Take 500 mg by mouth every 6 (six) hours as needed for mild pain.    . Ascorbic Acid (VITAMIN C) 1000 MG tablet Take 1,000 mg by mouth daily.    [name] CHIA SEED PO Take 5 mLs by mouth daily.     . cholecalciferol (VITAMIN D) 1000 UNITS tablet Take 1,000 Units by mouth daily.    . Coenzyme Q10 150 MG CAPS Take by mouth  2 times daily at 12 noon and 4 pm.    . COMBIGAN 0.2-0.5 % ophthalmic solution Place 1 drop into both eyes every morning.    . Cyanocobalamin (VITAMIN B-12) 1000 MCG SUBL Place 1,000 mcg under the tongue daily.     [name] ELIQUIS 2.5 MG TABS tablet TAKE 1 TABLET TWICE DAILY. 60 tablet 11  . Flaxseed, Linseed, (FLAX SEEDS PO) Take 1 capsule by mouth daily.    [name] latanoprost (XALATAN) 0.005 % ophthalmic solution Place 1 drop into both eyes every evening.    . LevOCARNitine (L-CARNITINE) 500 MG TABS Take 500 mg by mouth daily.    . magnesium 30 MG tablet Take 30 mg by mouth daily.    . metoprolol succinate (TOPROL-XL) 25 MG 24 hr tablet Take 1 tablet (25 mg total) by mouth daily. 90 tablet 3  . Omega-3 Fatty Acids (FISH OIL) 1000 MG CAPS Take 2,000 mg by mouth daily.    [name] OVER  THE COUNTER MEDICATION Take 1 each by mouth daily. Hemp heart seeds    . OVER THE COUNTER MEDICATION Take 1,250 mg by mouth daily. "Omega-Q squid supplement    . PAPAYA PO Take 1 capsule by mouth daily.    . polyethylene glycol (MIRALAX / GLYCOLAX) packet Take 17 g by mouth daily as needed for mild constipation.    . Probiotic Product (PROBIOTIC DAILY PO) Take 1 capsule by mouth daily. 10 Billi CFU     . Taurine 500 MG CAPS Take 500 mg by mouth daily.    . timolol (TIMOPTIC) 0.5 % ophthalmic solution Place 1 drop into the right eye 2 (two) times daily.     No facility-administered medications prior to visit.      Allergies:   Amiodarone; Amoxicillin; Amoxicillin-pot clavulanate; Aspirin; Ciprofloxacin; Codeine; Diuretic [buchu-cornsilk-ch grass-hydran]; Iohexol; Lisinopril; Midazolam hcl; Quinolones; Sotalol; Sulfa antibiotics; Thioxanthenes; Valium; Vancomycin; and Versed [midazolam]   Social History   Socioeconomic History  . Marital status: Widowed    Spouse name: None  . Number of children: None  . Years of education: None  . Highest education level: None  Social Needs  . Financial resource strain: None  . Food insecurity - worry: None  . Food insecurity - inability: None  . Transportation needs - medical: None  . Transportation needs - non-medical: None  Occupational History  . None  Tobacco Use  . Smoking status: Never Smoker  . Smokeless tobacco: Never Used  Substance and Sexual Activity  . Alcohol use: No  . Drug use: No  . Sexual activity: No    Comment: Smoked one cigarette a day as a teen  Other Topics Concern  . None  Social History Narrative   Pt lives in [place] alone.  Homemaker.  Widowed     Family History:  The patient's family history includes Other in her sister.   ROS:   Please see the history of present illness.    No new complaints All other systems reviewed and are negative.   PHYSICAL EXAM:   VS:  BP 132/68   Pulse 81   Ht 5\' 3"  (1.6 m)    Wt 139 lb (63 kg)   BMI 24.62 kg/m    GEN: Well nourished, well developed, in no acute distress  HEENT: normal  Neck: no JVD, carotid bruits, or masses Cardiac: IIRR; no murmurs, rubs, or gallops,no edema  Respiratory:  clear to auscultation bilaterally, normal work of breathing GI: soft, nontender, nondistended, + BS MS: no deformity  or atrophy  Skin: warm and dry, no rash Neuro:  Alert and Oriented x 3, Strength and sensation are intact Psych: euthymic mood, full affect  Wt Readings from Last 3 Encounters:  08/28/17 139 lb (63 kg)  08/20/17 135 lb (61.2 kg)  05/17/17 129 lb 6.4 oz (58.7 kg)      Studies/Labs Reviewed:   EKG:  EKG atrial flutter with variable ventricular response and the rate of 81 bpm.  Compared to the prior tracing heart rate is much improved.  Recent Labs: 11/17/2016: ALT 21 05/17/2017: BUN 15; Creatinine, Ser 0.67; Hemoglobin 13.1; Platelets 165; Potassium 4.6; Sodium 141; TSH 3.680   Lipid Panel No results found for: CHOL, TRIG, HDL, CHOLHDL, VLDL, LDLCALC, LDLDIRECT  Additional studies/ records that were reviewed today include:  None    ASSESSMENT:    1. Atrial flutter, unspecified type ([place])   2. SOB (shortness of breath) on exertion   3. S/P mitral valve repair   4. Anticoagulation goal of INR 2 to 3      PLAN:  In order of problems listed above:  1. Improved rate control on low-dose beta-blocker therapy.  Plan 24-hour Holter monitor to exclude excessive bradycardia and to document rate control at the current level of beta-blockade. 2. Not really a significant problem at this time. 3. No change in exam.  No murmur. 4. Continue apixaban.  98-month follow-up.  May need to come back earlier if heart rate is poorly controlled or further medication adjustments are required.    Medication Adjustments/Labs and Tests Ordered: Current medicines are reviewed at length with the patient today.  Concerns regarding medicines are outlined above.   Medication changes, Labs and Tests ordered today are listed in the Patient Instructions below. Patient Instructions  Medication Instructions:  Your physician recommends that you continue on your current medications as directed. Please refer to the Current Medication list given to you today.  Labwork: None  Testing/Procedures: Your physician has recommended that you wear a 24 hour holter monitor. Holter monitors are medical devices that record the heart's electrical activity. Doctors most often use these monitors to diagnose arrhythmias. Arrhythmias are problems with the speed or rhythm of the heartbeat. The monitor is a small, portable device. You can wear one while you do your normal daily activities. This is usually used to diagnose what is causing palpitations/syncope (passing out).   Follow-Up: Your physician wants you to follow-up in: 6 months with Dr. Tamala Julian. You will receive a reminder letter in the mail two months in advance. If you don't receive a letter, please call our office to schedule the follow-up appointment.   Any Other Special Instructions Will Be Listed Below (If Applicable).     If you need a refill on your cardiac medications before your next appointment, please call your pharmacy.      Signed, Sinclair Grooms, MD  08/28/2017 3:06 PM    [facility] [address], [place], [place]  [address] Phone: [phone]; Fax: [phone]

## 2017-08-28 ENCOUNTER — Ambulatory Visit (INDEPENDENT_AMBULATORY_CARE_PROVIDER_SITE_OTHER): Payer: Medicare Other | Admitting: Interventional Cardiology

## 2017-08-28 ENCOUNTER — Encounter: Payer: Self-pay | Admitting: Interventional Cardiology

## 2017-08-28 VITALS — BP 132/68 | HR 81 | Ht 63.0 in | Wt 139.0 lb

## 2017-08-28 DIAGNOSIS — Z5181 Encounter for therapeutic drug level monitoring: Secondary | ICD-10-CM

## 2017-08-28 DIAGNOSIS — I4892 Unspecified atrial flutter: Secondary | ICD-10-CM | POA: Diagnosis not present

## 2017-08-28 DIAGNOSIS — Z7901 Long term (current) use of anticoagulants: Secondary | ICD-10-CM

## 2017-08-28 DIAGNOSIS — R0602 Shortness of breath: Secondary | ICD-10-CM

## 2017-08-28 DIAGNOSIS — Z9889 Other specified postprocedural states: Secondary | ICD-10-CM | POA: Diagnosis not present

## 2017-08-28 NOTE — Patient Instructions (Signed)
Medication Instructions:  Your physician recommends that you continue on your current medications as directed. Please refer to the Current Medication list given to you today.  Labwork: None  Testing/Procedures: Your physician has recommended that you wear a 24 hour holter monitor. Holter monitors are medical devices that record the heart's electrical activity. Doctors most often use these monitors to diagnose arrhythmias. Arrhythmias are problems with the speed or rhythm of the heartbeat. The monitor is a small, portable device. You can wear one while you do your normal daily activities. This is usually used to diagnose what is causing palpitations/syncope (passing out).   Follow-Up: Your physician wants you to follow-up in: 6 months with Dr. Smith.  You will receive a reminder letter in the mail two months in advance. If you don't receive a letter, please call our office to schedule the follow-up appointment.   Any Other Special Instructions Will Be Listed Below (If Applicable).     If you need a refill on your cardiac medications before your next appointment, please call your pharmacy.   

## 2017-09-05 ENCOUNTER — Ambulatory Visit (INDEPENDENT_AMBULATORY_CARE_PROVIDER_SITE_OTHER): Payer: Medicare Other

## 2017-09-05 DIAGNOSIS — I4892 Unspecified atrial flutter: Secondary | ICD-10-CM | POA: Diagnosis not present

## 2017-09-16 ENCOUNTER — Telehealth: Payer: Self-pay | Admitting: Interventional Cardiology

## 2017-09-16 NOTE — Telephone Encounter (Signed)
Spoke with pt and went over monitor results.  Pt verbalized understanding and was appreciative for call.

## 2017-09-16 NOTE — Telephone Encounter (Signed)
Patient returning call from this morning.

## 2017-10-02 DIAGNOSIS — R000 Tachycardia, unspecified: Secondary | ICD-10-CM | POA: Diagnosis not present

## 2017-10-02 DIAGNOSIS — I4891 Unspecified atrial fibrillation: Secondary | ICD-10-CM | POA: Diagnosis not present

## 2017-10-03 DIAGNOSIS — D1801 Hemangioma of skin and subcutaneous tissue: Secondary | ICD-10-CM | POA: Diagnosis not present

## 2017-10-03 DIAGNOSIS — L814 Other melanin hyperpigmentation: Secondary | ICD-10-CM | POA: Diagnosis not present

## 2017-10-03 DIAGNOSIS — D225 Melanocytic nevi of trunk: Secondary | ICD-10-CM | POA: Diagnosis not present

## 2017-10-03 DIAGNOSIS — D2272 Melanocytic nevi of left lower limb, including hip: Secondary | ICD-10-CM | POA: Diagnosis not present

## 2017-10-03 DIAGNOSIS — L821 Other seborrheic keratosis: Secondary | ICD-10-CM | POA: Diagnosis not present

## 2017-10-07 DIAGNOSIS — H349 Unspecified retinal vascular occlusion: Secondary | ICD-10-CM | POA: Diagnosis not present

## 2017-10-07 DIAGNOSIS — H52203 Unspecified astigmatism, bilateral: Secondary | ICD-10-CM | POA: Diagnosis not present

## 2017-10-07 DIAGNOSIS — H401112 Primary open-angle glaucoma, right eye, moderate stage: Secondary | ICD-10-CM | POA: Diagnosis not present

## 2017-11-05 DIAGNOSIS — I4891 Unspecified atrial fibrillation: Secondary | ICD-10-CM | POA: Diagnosis not present

## 2017-11-06 DIAGNOSIS — H401132 Primary open-angle glaucoma, bilateral, moderate stage: Secondary | ICD-10-CM | POA: Diagnosis not present

## 2017-11-12 DIAGNOSIS — R600 Localized edema: Secondary | ICD-10-CM | POA: Diagnosis not present

## 2017-11-12 DIAGNOSIS — I4891 Unspecified atrial fibrillation: Secondary | ICD-10-CM | POA: Diagnosis not present

## 2017-11-15 ENCOUNTER — Other Ambulatory Visit: Payer: Self-pay | Admitting: Physician Assistant

## 2017-11-15 ENCOUNTER — Telehealth: Payer: Self-pay | Admitting: Interventional Cardiology

## 2017-11-15 ENCOUNTER — Ambulatory Visit (INDEPENDENT_AMBULATORY_CARE_PROVIDER_SITE_OTHER): Payer: Medicare Other | Admitting: Physician Assistant

## 2017-11-15 ENCOUNTER — Encounter: Payer: Self-pay | Admitting: Physician Assistant

## 2017-11-15 VITALS — BP 100/58 | HR 88 | Ht 63.0 in | Wt 140.0 lb

## 2017-11-15 DIAGNOSIS — R197 Diarrhea, unspecified: Secondary | ICD-10-CM | POA: Diagnosis not present

## 2017-11-15 DIAGNOSIS — I4892 Unspecified atrial flutter: Secondary | ICD-10-CM | POA: Diagnosis not present

## 2017-11-15 DIAGNOSIS — I4891 Unspecified atrial fibrillation: Secondary | ICD-10-CM

## 2017-11-15 DIAGNOSIS — I480 Paroxysmal atrial fibrillation: Secondary | ICD-10-CM | POA: Diagnosis not present

## 2017-11-15 DIAGNOSIS — M7989 Other specified soft tissue disorders: Secondary | ICD-10-CM

## 2017-11-15 DIAGNOSIS — Z9889 Other specified postprocedural states: Secondary | ICD-10-CM

## 2017-11-15 NOTE — Telephone Encounter (Signed)
STAT if HR is under 50 or over 120 (normal HR is 60-100 beats per minute)  1) What is your heart rate? 120  2) Do you have a log of your heart rate readings (document readings)? 104 120  3) Do you have any other symptoms? 138/74 feeling like she is in A-fib, heart racing feeling do not go away

## 2017-11-15 NOTE — Patient Instructions (Signed)
Medication Instructions:  1. Your physician recommends that you continue on your current medications as directed. Please refer to the Current Medication list given to you today.   Labwork: TODAY BMET, CBC W/DIFF  Testing/Procedures: NONE ORDERED TODAY  Follow-Up: KEEP YOUR APPT DR. Tamala Julian AS PLANNED   Any Other Special Instructions Will Be Listed Below (If Applicable).  IF YOUR SYMPTOMS CONTINUE WITH INCREASED HEART RATE PLEASE CALL THE OFFICE [phone]  IF YOUR DIARRHEA STILL CONTINUES PLEASE FOLLOW UP WITH PRIMARY CARE   If you need a refill on your cardiac medications before your next appointment, please call your pharmacy.

## 2017-11-15 NOTE — Telephone Encounter (Signed)
[name] from [facility] calling stating that she spoke with pt this morning and she c/o of feeling her heart race all night.  Vitals haven't been checked today but last night BP was 138/74, HR 104.  Pt seen Dr. Inda Merlin Tuesday and HR was 120 and he increased her Metoprolol to BID.  Pt denied any sx to [place] other than the feeling of racing heart.  Dr. Inda Merlin recommended to pt to f/u with cardiology.  Pt also having issues with edema in lower extremities.  Dr. Inda Merlin gave Furosemide and there has been improvement.  Spoke with Tara Dopp, PA-C and he said ok to add pt to schedule at 11:45AM.  Spoke with [name] and made her aware.  Facility will bring pt over.

## 2017-11-15 NOTE — Progress Notes (Signed)
Cardiology Office Note:    Date:  11/15/2017   ID:  [name], DOB Oct 18, 1925, MRN [id number]  PCP:  Tara Huddle, MD  Cardiologist:  Tara Grooms, MD   Referring MD: Tara Huddle, MD   Chief Complaint  Patient presents with  . Palpitations    History of Present Illness:    [name] is a 84 y.o. female with mitral valve prolapse status post mitral valve repair in 2010, persistent atrial fibrillation/flutter, diastolic heart failure.  Last seen by Dr. Tamala Julian 08/2017.  Follow-up Holter monitor demonstrated average heart rate 74.  [name] presents for evaluation of rapid palpitations.  She sometimes takes Lasix for lower extremity edema.  She took a dose yesterday.  Later in the day, she notes tremors as well as diarrhea.  When she checked her blood pressure with her wrist cuff, her heart rate was in the 130s.  She was symptomatic with this.  She denies any chest pain or shortness of breath.  She denies orthopnea or PND.  Her lower extremity swelling did improve with Lasix.  She has tried to switch to a bland diet.  Of note, her primary care doctor took her off of metoprolol recently due to side effects of fatigue.  She is now on diltiazem.  Prior CV studies:   The following studies were reviewed today:  Holter 09/05/2017  Continuous atrial flutter with controlled ventricular response  No pauses greater than 2.2 seconds  No complaints/diary entries Continuous atrial flutter with adequate rate control. No significant bradycardia. Average heart rate 74  Holter monitor 05/24/2017 Continuous atrial fibrillation with controlled heart rate, average 75 bpm.  No excessive pauses.  Echo 09/10/2012 Status post mitral valve repair, mild MR, mild LAE, mild concentric LVH, EF 50-55, mild TR, trace AI/PI  Cardiac catheterization 11/11/2008  CONCLUSION:  1. Moderately severe to severe mitral regurgitation.  2. Mild pulmonary hypertension.  3. Widely patent coronaries.  4. Normal  left ventricular function with ejection fraction greater      than 60%.  Past Medical History:  Diagnosis Date  . Anxiety   . Arthritis    Some in neck and back, but "can't complain"  . Atypical atrial flutter ([place])   . Breast cancer ([place])   . Cancer [facility])    1998 Right breast cancer with lumpectomy and radiation therapy  . Chronic diastolic CHF (congestive heart failure) ([place])   . Complication of anesthesia    Nausea from versed  . GERD (gastroesophageal reflux disease)   . Glaucoma   . Hiatal hernia   . History of mitral valve repair    Mitral valve prolapse, surgery in 2010  . Hyponatremia   . Normocytic anemia 03/21/2014  . Persistent atrial fibrillation ([place])    s/p MAZE 2010  . Personal history of radiation therapy   . Rheumatic fever    age 41  . Thrombocytopenia ([place])    Surgical Hx: The patient  has a past surgical history that includes Mitral valve repair (June 2010); Cataract extraction; Kidney cyst removal; Breast surgery; Back surgery; Eye surgery; Appendectomy; Tonsillectomy; Cesarean section; TEE without cardioversion (04/29/2012); Cardioversion (04/29/2012); Cardioversion (06/19/2012); Breast lumpectomy; Breast biopsy; and Abdominal hysterectomy.   Current Medications: Current Meds  Medication Sig  . acetaminophen (TYLENOL) 500 MG tablet Take 500 mg by mouth every 6 (six) hours as needed for mild pain.  . Ascorbic Acid (VITAMIN C) 1000 MG tablet Take 1,000 mg by mouth daily.  . brimonidine (ALPHAGAN)  0.2 % ophthalmic solution Place 1 drop into both eyes 2 (two) times daily.  [name] CHIA SEED PO Take 5 mLs by mouth daily.   . cholecalciferol (VITAMIN D) 1000 UNITS tablet Take 1,000 Units by mouth daily.  . Coenzyme Q10 150 MG CAPS Take by mouth 2 times daily at 12 noon and 4 pm.  . COMBIGAN 0.2-0.5 % ophthalmic solution Place 1 drop into both eyes every morning.  . Cyanocobalamin (VITAMIN B-12) 1000 MCG SUBL Place 1,000 mcg under the tongue daily.   [name] diltiazem  (CARDIZEM CD) 120 MG 24 hr capsule Take 1 capsule by mouth daily.  [name] ELIQUIS 2.5 MG TABS tablet TAKE 1 TABLET TWICE DAILY.  [name] Flaxseed, Linseed, (FLAX SEEDS PO) Take 1 capsule by mouth daily.  . LevOCARNitine (L-CARNITINE) 500 MG TABS Take 500 mg by mouth daily.  . magnesium 30 MG tablet Take 30 mg by mouth daily.  . Omega-3 Fatty Acids (FISH OIL) 1000 MG CAPS Take 2,000 mg by mouth daily.  [name] OVER THE COUNTER MEDICATION Take 1 each by mouth daily. Hemp heart seeds  . OVER THE COUNTER MEDICATION Take 1,250 mg by mouth daily. "Omega-Q squid supplement  . PAPAYA PO Take 1 capsule by mouth daily.  . polyethylene glycol (MIRALAX / GLYCOLAX) packet Take 17 g by mouth daily as needed for mild constipation.  . Probiotic Product (PROBIOTIC DAILY PO) Take 1 capsule by mouth daily. 10 Billi CFU   . Taurine 500 MG CAPS Take 500 mg by mouth daily.     Allergies:   Amiodarone; Amoxicillin; Amoxicillin-pot clavulanate; Aspirin; Ciprofloxacin; Codeine; Diuretic [buchu-cornsilk-ch grass-hydran]; Iohexol; Lisinopril; Midazolam hcl; Quinolones; Sotalol; Sulfa antibiotics; Thioxanthenes; Valium; Vancomycin; and Versed [midazolam]   Social History   Tobacco Use  . Smoking status: Never Smoker  . Smokeless tobacco: Never Used  Substance Use Topics  . Alcohol use: No  . Drug use: No     Family Hx: The patient's family history includes Other in her sister.  ROS:   Please see the history of present illness.    Review of Systems  Constitution: Positive for malaise/fatigue and weight gain.  Eyes: Positive for visual disturbance.  Cardiovascular: Positive for leg swelling and palpitations.  Gastrointestinal: Positive for diarrhea.  Neurological: Positive for loss of balance.   All other systems reviewed and are negative.   EKGs/Labs/Other Test Reviewed:    EKG:  EKG is  ordered today.  The ekg ordered today demonstrates atrial flutter, HR 88  Recent Labs: 11/17/2016: ALT 21 05/17/2017: BUN 15;  Creatinine, Ser 0.67; Hemoglobin 13.1; Platelets 165; Potassium 4.6; Sodium 141; TSH 3.680   Recent Lipid Panel No results found for: CHOL, TRIG, HDL, CHOLHDL, LDLCALC, LDLDIRECT  Physical Exam:    VS:  BP (!) 100/58   Pulse 88   Ht 5\' 3"  (1.6 m)   Wt 140 lb (63.5 kg)   SpO2 96%   BMI 24.80 kg/m     Wt Readings from Last 3 Encounters:  11/15/17 140 lb (63.5 kg)  08/28/17 139 lb (63 kg)  08/20/17 135 lb (61.2 kg)     Physical Exam  Constitutional: She is oriented to person, place, and time. She appears well-developed and well-nourished. No distress.  HENT:  Head: Normocephalic and atraumatic.  Neck: Neck supple. No JVD present.  Cardiovascular: Normal rate, S1 normal, S2 normal and normal heart sounds. An irregularly irregular rhythm present.  No murmur heard. Pulmonary/Chest: Effort normal. She has no rales.  Abdominal: Soft. There is  no hepatomegaly.  Musculoskeletal: She exhibits edema (1+ bilat ankle edema).  Neurological: She is alert and oriented to person, place, and time.  Skin: Skin is warm and dry.    ASSESSMENT & PLAN:    Atrial fibrillation and flutter ([place]) She noted rapid palpitations last night.  This occurred after diarrhea and what sounds like tremors.  I suspect her high heart rate was related to her acute illness.  She is feeling better today.  Her heart rate is well controlled today in the 80s.  Her metoprolol was recently changed to Cardizem.  If she continues to have palpitations, we will need to set her up for another 24-hour Holter monitor to assess her average heart rate.  She remains on anticoagulation with apixaban.  Her weight has been below 60 kg in the past.  She is just above 60 kg now.  If her weight remains the same, we will need to increase her Apixaban to 5 mg twice daily.  S/P mitral valve repair Echo in 2014 with well-functioning mitral valve repair.  Continue SBE prophylaxis.  Leg swelling She took Lasix yesterday for swelling.  This  resulted in some diarrhea.  She has a lot of varicosities on exam.  I suspect her swelling is more related to venous insufficiency than anything else.  I have asked her to try to keep her legs elevated and to use compression stockings if possible.  Diarrhea, unspecified type We discussed a bland diet and Imodium as needed.  If her diarrhea continues, she will need follow-up with primary care.  Obtain BMET, CBC with Diff today.     Dispo:  Return in about 3 months (around 02/25/2018) for Scheduled Follow Up w/ Dr. Tamala Julian.   Medication Adjustments/Labs and Tests Ordered: Current medicines are reviewed at length with the patient today.  Concerns regarding medicines are outlined above.  Tests Ordered: Orders Placed This Encounter  Procedures  . Basic Metabolic Panel (BMET)  . CBC w/Diff  . EKG 12-Lead   Medication Changes: No orders of the defined types were placed in this encounter.   Signed, Richardson Dopp, PA-C  11/15/2017 1:58 PM    [facility] [address], [place], [place]  [address] Phone: [phone]; Fax: [phone]

## 2017-11-16 LAB — CBC WITH DIFFERENTIAL/PLATELET
Basophils Absolute: 0 10*3/uL (ref 0.0–0.2)
Basos: 0 %
EOS (ABSOLUTE): 0 10*3/uL (ref 0.0–0.4)
Eos: 1 %
Hematocrit: 40.6 % (ref 34.0–46.6)
Hemoglobin: 14.3 g/dL (ref 11.1–15.9)
Immature Grans (Abs): 0 10*3/uL (ref 0.0–0.1)
Immature Granulocytes: 0 %
Lymphocytes Absolute: 2.1 10*3/uL (ref 0.7–3.1)
Lymphs: 33 %
MCH: 34.9 pg — ABNORMAL HIGH (ref 26.6–33.0)
MCHC: 35.2 g/dL (ref 31.5–35.7)
MCV: 99 fL — ABNORMAL HIGH (ref 79–97)
Monocytes Absolute: 0.5 10*3/uL (ref 0.1–0.9)
Monocytes: 7 %
Neutrophils Absolute: 3.7 10*3/uL (ref 1.4–7.0)
Neutrophils: 59 %
Platelets: 192 10*3/uL (ref 150–379)
RBC: 4.1 x10E6/uL (ref 3.77–5.28)
RDW: 13.2 % (ref 12.3–15.4)
WBC: 6.3 10*3/uL (ref 3.4–10.8)

## 2017-11-16 LAB — BASIC METABOLIC PANEL
BUN/Creatinine Ratio: 20 (ref 12–28)
BUN: 13 mg/dL (ref 10–36)
CO2: 25 mmol/L (ref 20–29)
Calcium: 9.9 mg/dL (ref 8.7–10.3)
Chloride: 92 mmol/L — ABNORMAL LOW (ref 96–106)
Creatinine, Ser: 0.65 mg/dL (ref 0.57–1.00)
GFR calc Af Amer: 90 mL/min/{1.73_m2} (ref >59)
GFR calc non Af Amer: 78 mL/min/{1.73_m2} (ref >59)
Glucose: 100 mg/dL — ABNORMAL HIGH (ref 65–99)
Potassium: 4.5 mmol/L (ref 3.5–5.2)
Sodium: 135 mmol/L (ref 134–144)

## 2017-11-18 ENCOUNTER — Encounter: Payer: Self-pay | Admitting: *Deleted

## 2017-11-18 NOTE — Telephone Encounter (Signed)
This encounter was created in error - please disregard.

## 2017-12-10 DIAGNOSIS — R300 Dysuria: Secondary | ICD-10-CM | POA: Diagnosis not present

## 2017-12-16 ENCOUNTER — Telehealth: Payer: Self-pay | Admitting: *Deleted

## 2017-12-16 ENCOUNTER — Other Ambulatory Visit: Payer: Self-pay | Admitting: Interventional Cardiology

## 2017-12-16 MED ORDER — APIXABAN 5 MG PO TABS: 5.0000 mg | ORAL_TABLET | Freq: Two times a day (BID) | ORAL | 11 refills | Status: DC

## 2017-12-16 NOTE — Telephone Encounter (Addendum)
Called pt and informed her that her dose will changed to Eliquis 5mg  twice a day and explained to her the reasoning and she stated she wanted to be sure everything was done correctly and that she appreciated the update so she would not any questions once she received the refill. She did not have any other questions and advised her to call if she needed anything and she verbalized understanding.   ----- Message from Belva Crome, MD sent at 12/16/2017  3:34 PM EDT ----- Regarding: Dose change 5 mg BID ----- Message ----- From: Marcos Eke, RN Sent: 12/16/2017  11:39 AM To: Belva Crome, MD  Dr. Tamala Julian, Received Eliquis 2.5mg  refill request on the pt; the pt is 84 yrs old, wt-63.5kg, Crea-0.65 on 11/15/17; when she was last seen by Richardson Dopp on 11/15/17 he noted that "If her weight remains the same, we will need to increase her Apixaban to 5 mg twice daily"; therefore per dosing criteria she should be on the Eliquis 5mg  BID; please advise and Thanks.

## 2017-12-16 NOTE — Telephone Encounter (Addendum)
Eliquis 2.5mg  refill request received; pt is 84 yrs old, wt-63.5kg, Crea-0.65 on 11/15/17, last seen by Richardson Dopp on 11/15/17. Message sent to Dr. Tamala Julian regarding pt's doseage & dose switched to Eliquis 5mg  BID per Dr. Tamala Julian.

## 2017-12-24 DIAGNOSIS — I872 Venous insufficiency (chronic) (peripheral): Secondary | ICD-10-CM | POA: Diagnosis not present

## 2018-01-22 ENCOUNTER — Telehealth: Payer: Self-pay | Admitting: Interventional Cardiology

## 2018-01-22 NOTE — Telephone Encounter (Signed)
Did not need this encounter °

## 2018-01-22 NOTE — Telephone Encounter (Signed)
Spoke with pt and she states last week she had two episodes on different nights where she had a burning sensation in her chest that radiated to her back.  Neither lasted long.  Pt took indigestion meds with no relief.  Had nurse at [facility] come see her to eval and she recommended pt be seen in our office.  Vitals 104/74, 106/77, HR 90-115.  Denies lightheadedness, dizziness, SOB, swelling or other cardiac issues.  Pt states she has not had any further episodes and feels fine today and HR has returned to normal.  Scheduled pt to come in 7/23 with Truitt Merle, NP.  Pt verbalized understanding and was in agreement with this plan.

## 2018-01-28 ENCOUNTER — Encounter: Payer: Self-pay | Admitting: Nurse Practitioner

## 2018-01-28 ENCOUNTER — Ambulatory Visit (INDEPENDENT_AMBULATORY_CARE_PROVIDER_SITE_OTHER): Payer: Medicare Other | Admitting: Nurse Practitioner

## 2018-01-28 VITALS — BP 142/80 | HR 107 | Ht 62.5 in | Wt 142.8 lb

## 2018-01-28 DIAGNOSIS — I4892 Unspecified atrial flutter: Secondary | ICD-10-CM | POA: Diagnosis not present

## 2018-01-28 DIAGNOSIS — Z9889 Other specified postprocedural states: Secondary | ICD-10-CM

## 2018-01-28 DIAGNOSIS — I4891 Unspecified atrial fibrillation: Secondary | ICD-10-CM

## 2018-01-28 MED ORDER — METOPROLOL TARTRATE 50 MG PO TABS: 50.0000 mg | ORAL_TABLET | Freq: Two times a day (BID) | ORAL | 3 refills | Status: DC

## 2018-01-28 NOTE — Progress Notes (Signed)
CARDIOLOGY OFFICE NOTE  Date:  01/28/2018    [name] Date of Birth: Aug 30, 1925 Medical Record #[id number]  PCP:  Tara Huddle, MD  Cardiologist:  [name]    Chief Complaint  Patient presents with  . Chest Pain    Work in visit - seen for Dr. Tamala [name]    History of Present Illness: [name] is a 84 y.o. female who presents today for a work in visit. Seen for Dr. Tamala [name].   She has a history of MVR s/p mitral valve repair in 2010, persistent atrial fibrillation/flutter, diastolic heart failure.  Last seen by Dr. Tamala [name] 08/2017.  Follow-up Holter monitor demonstrated average heart rate 74.  She was last seen here in May by Tara Dopp, PA for some rapid palpitations. She had been switched over to diltiazem from Metoprolol due to side effects of fatigue.   Phone call from last week - "Spoke with pt and she states last week she had two episodes on different nights where she had a burning sensation in her chest that radiated to her back.  Neither lasted long.  Pt took indigestion meds with no relief.  Had nurse at [facility] come see her to eval and she recommended pt be seen in our office.  Vitals 104/74, 106/77, HR 90-115.  Denies lightheadedness, dizziness, SOB, swelling or other cardiac issues.  Pt states she has not had any further episodes and feels fine today and HR has returned to normal.  Scheduled pt to come in 7/23 with Tara Merle, NP.  Pt verbalized understanding and was in agreement with this plan".   Comes in today. Here alone. She notes that over the past week,, she has had 3 spells - 2 at night which woke her up and one after a shower where she felt an indigestion like feeling in her chest - radiated to her back - lasted about 20 minutes. No other symptoms. Was not short of breath. No palpitations endorsed. She took papaya and used CBD cream - no relief. She notes that she went back to Metoprolol due to swelling "all over" with the Diltiazem. She thinks  she did not tell [name] this. She has otherwise done ok.   Past Medical History:  Diagnosis Date  . Anxiety   . Arthritis    Some in neck and back, but "can't complain"  . Atypical atrial flutter ([place])   . Breast cancer ([place])   . Cancer [facility])    1998 Right breast cancer with lumpectomy and radiation therapy  . Chronic diastolic CHF (congestive heart failure) ([place])   . Complication of anesthesia    Nausea from versed  . GERD (gastroesophageal reflux disease)   . Glaucoma   . Hiatal hernia   . History of mitral valve repair    Mitral valve prolapse, surgery in 2010  . Hyponatremia   . Normocytic anemia 03/21/2014  . Persistent atrial fibrillation ([place])    s/p MAZE 2010  . Personal history of radiation therapy   . Rheumatic fever    age 36  . Thrombocytopenia ([place])     Past Surgical History:  Procedure Laterality Date  . ABDOMINAL HYSTERECTOMY    . APPENDECTOMY    . BACK SURGERY     Synovial cyst on spine, removed laproscopically in 2000?  [name] BREAST BIOPSY    . BREAST LUMPECTOMY     right 1998  . BREAST SURGERY     1998 Lumpectomy in right  breast  . CARDIOVERSION  04/29/2012   Procedure: CARDIOVERSION;  Surgeon: Tara Margarita, MD;  Location: [facility] ENDOSCOPY;  Service: Cardiovascular;  Laterality: N/A;  . CARDIOVERSION  06/19/2012   Procedure: CARDIOVERSION;  Surgeon: Tara Grooms, MD;  Location: [facility] ENDOSCOPY;  Service: Cardiovascular;  Laterality: N/A;  h/p from 11/8 in file drawer/dl  . CATARACT EXTRACTION    . CESAREAN SECTION     Three C-Sections  . EYE SURGERY     Cataract Surgery, both eyes  . KIDNEY CYST REMOVAL    . MITRAL VALVE REPAIR  June 2010  . TEE WITHOUT CARDIOVERSION  04/29/2012   Procedure: TRANSESOPHAGEAL ECHOCARDIOGRAM (TEE);  Surgeon: Tara Margarita, MD;  Location: [facility] ENDOSCOPY;  Service: Cardiovascular;  Laterality: N/A;  . TONSILLECTOMY       Medications: Current Meds  Medication Sig  . acetaminophen (TYLENOL) 500 MG tablet Take 500 mg  by mouth every 6 (six) hours as needed for mild pain.  [name] apixaban (ELIQUIS) 5 MG TABS tablet Take 1 tablet (5 mg total) by mouth 2 (two) times daily.  . Ascorbic Acid (VITAMIN C) 1000 MG tablet Take 1,000 mg by mouth daily.  . brimonidine (ALPHAGAN) 0.2 % ophthalmic solution Place 1 drop into both eyes 2 (two) times daily.  [name] CHIA SEED PO Take 5 mLs by mouth daily.   . cholecalciferol (VITAMIN D) 1000 UNITS tablet Take 1,000 Units by mouth daily.  . Coenzyme Q10 150 MG CAPS Take by mouth 2 times daily at 12 noon and 4 pm.  . Cyanocobalamin (VITAMIN B-12) 1000 MCG SUBL Place 1,000 mcg under the tongue daily.   . Flaxseed, Linseed, (FLAX SEEDS PO) Take 1 capsule by mouth daily.  . LevOCARNitine (L-CARNITINE) 500 MG TABS Take 500 mg by mouth daily.  . magnesium 30 MG tablet Take 30 mg by mouth daily.  . Omega-3 Fatty Acids (FISH OIL) 1000 MG CAPS Take 2,000 mg by mouth daily.  [name] OVER THE COUNTER MEDICATION Take 1 each by mouth daily. Hemp heart seeds  . OVER THE COUNTER MEDICATION Take 1,250 mg by mouth daily. "Omega-Q squid supplement  . PAPAYA PO Take 1 capsule by mouth daily.  . polyethylene glycol (MIRALAX / GLYCOLAX) packet Take 17 g by mouth daily as needed for mild constipation.  . Probiotic Product (PROBIOTIC DAILY PO) Take 1 capsule by mouth daily. 10 Billi CFU   . Taurine 500 MG CAPS Take 500 mg by mouth daily.  . [DISCONTINUED] diltiazem (CARDIZEM CD) 120 MG 24 hr capsule Take 1 capsule by mouth daily.     Allergies: Allergies  Allergen Reactions  . Amiodarone     sick  . Amoxicillin Nausea And Vomiting  . Amoxicillin-Pot Clavulanate Nausea And Vomiting  . Aspirin     Unknown... 325 mg and more.  . Ciprofloxacin     Flu symptoms  . Codeine Nausea And Vomiting  . Diuretic [Buchu-Cornsilk-Ch Grass-Hydran]     Pharmacy record  . Iohexol Hives     Code: HIVES, Desc: **11/22/08 **NO REACTION W/ OMNI 300// PT S/P 3 CT SCANS W/ IV CM AND NO HIVES/MMS**PT STATED HIVES 16 YRS AGO.  POSSIBLE IV DYE W/ CT/XRAY. MEDICATED W/ 50MG  OF BENEDRYL PO PRIOR TO SCAN, Onset Date: [id number]   . Lisinopril     cough  . Midazolam Hcl Nausea And Vomiting  . Quinolones Other (See Comments)    Flu like symptoms  . Sotalol     Unknown   . Sulfa  Antibiotics Other (See Comments)    Flu like symptoms 'serum sickness'  . Thioxanthenes Other (See Comments)    Flu like symptoms 'serum sickness'  . Valium Nausea And Vomiting  . Vancomycin     unknown  . Versed [Midazolam] Nausea And Vomiting    Social History: The patient  reports that she has never smoked. She has never used smokeless tobacco. She reports that she does not drink alcohol or use drugs.   Family History: The patient's family history includes Other in her sister.   Review of Systems: Please see the history of present illness.   Otherwise, the review of systems is positive for none.   All other systems are reviewed and negative.   Physical Exam: VS:  BP (!) 142/80 (BP Location: Left Arm, Patient Position: Sitting, Cuff Size: Normal)   Pulse (!) 107   Ht 5' 2.5" (1.588 m)   Wt 142 lb 12.8 oz (64.8 kg)   BMI 25.70 kg/m  .  BMI Body mass index is 25.7 kg/m.  Wt Readings from Last 3 Encounters:  01/28/18 142 lb 12.8 oz (64.8 kg)  11/15/17 140 lb (63.5 kg)  08/28/17 139 lb (63 kg)    General: Pleasant. Well developed, well nourished and in no acute distress.   HEENT: Normal.  Neck: Supple, no JVD, carotid bruits, or masses noted.  Cardiac: Irregular irregular rhythm. Her rate is fast. No murmurs, rubs, or gallops. Her legs are full but no significant edema.  Respiratory:  Lungs are clear to auscultation bilaterally with normal work of breathing.  GI: Soft and nontender.  MS: No deformity or atrophy. Gait and ROM intact.  Skin: Warm and dry. Color is normal.  Neuro:  Strength and sensation are intact and no gross focal deficits noted.  Psych: Alert, appropriate and with normal affect.   LABORATORY  DATA:  EKG:  EKG is ordered today. This demonstrates probable atrial flutter with 1:1 block - HR is 107 - reviewed with Dr. Tamala [name].  Lab Results  Component Value Date   WBC 6.3 11/15/2017   HGB 14.3 11/15/2017   HCT 40.6 11/15/2017   PLT 192 11/15/2017   GLUCOSE 100 (H) 11/15/2017   ALT 21 11/17/2016   AST 26 11/17/2016   NA 135 11/15/2017   K 4.5 11/15/2017   CL 92 (L) 11/15/2017   CREATININE 0.65 11/15/2017   BUN 13 11/15/2017   CO2 25 11/15/2017   TSH 3.680 05/17/2017   INR 1.23 04/30/2012   HGBA1C  12/14/2008    5.2 (NOTE) The ADA recommends the following therapeutic goal for glycemic control related to Hgb A1c measurement: Goal of therapy: <6.5 Hgb A1c  Reference: American Diabetes Association: Clinical Practice Recommendations 2010, Diabetes Care, 2010, 33: (Suppl  1).     BNP (last 3 results) No results for input(s): BNP in the last 8760 hours.  ProBNP (last 3 results) No results for input(s): PROBNP in the last 8760 hours.   Other Studies Reviewed Today:  Holter 09/05/2017  Continuous atrial flutter with controlled ventricular response  No pauses greater than 2.2 seconds  No complaints/diary entries Continuous atrial flutter with adequate rate control. No significant bradycardia. Average heart rate 74  Holter monitor 05/24/2017 Continuous atrial fibrillation with controlled heart rate, average 75 bpm. No excessive pauses.  Echo 09/10/2012 Status post mitral valve repair, mild MR, mild LAE, mild concentric LVH, EF 50-55, mild TR, trace AI/PI  Cardiac catheterization 11/11/2008 CONCLUSION: 1. Moderately severe to severe mitral regurgitation. 2.  Mild pulmonary hypertension. 3. Widely patent coronaries. 4. Normal left ventricular function with ejection fraction greater than 60%.   Assessment/Plan:  1. Atrial fib/flutter with RVR - her heart rate is not controlled. This is probably the etiology of her chest discomfort. Discussed with Dr.  Tamala [name] - will increase her Metoprolol to 50 mg BID - need to get her heart rate more controlled - may have to then cut back (?25 mg TID) if she has side effects.   2. Prior MV repair - last echo was in 2014. No real murmur on exam. Will follow.   3. Remote cath - no CAD from 2010 noted. Her current chest pain is felt to be more related to the rate and rhythm.   4. Advanced age.   5. Chronic anticoagulation - she is on appropriate dosing with her Eliquis.    Current medicines are reviewed with the patient today.  The patient does not have concerns regarding medicines other than what has been noted above.  The following changes have been made:  See above.  Labs/ tests ordered today include:    Orders Placed This Encounter  Procedures  . Basic metabolic panel  . CBC  . TSH  . EKG 12-Lead     Disposition:   FU with Dr. Tamala [name] as planned next month.   Patient is agreeable to this plan and will call if any problems develop in the interim.   SignedTruitt Merle, NP  01/28/2018 12:50 PM  [facility] [address] [place], [place]  [address] Phone: [phone] Fax: [phone]

## 2018-01-28 NOTE — Patient Instructions (Addendum)
We will be checking the following labs today - BMET, CBC and TSH   Medication Instructions:    Continue with your current medicines. BUT  I am increasing the Metoprolol to 50 mg to take twice a day - ok to use 2 of your 25 mg tablets twice a day and use up - the RX for the 50 mg is at your pharmacy.     Testing/Procedures To Be Arranged:  N/A  Follow-Up:   See Dr. Tamala Julian as planned next month    Other Special Instructions:   N/A    If you need a refill on your cardiac medications before your next appointment, please call your pharmacy.   Call the [place] office at [phone] if you have any questions, problems or concerns.

## 2018-01-29 LAB — BASIC METABOLIC PANEL
BUN/Creatinine Ratio: 23 (ref 12–28)
BUN: 15 mg/dL (ref 10–36)
CO2: 23 mmol/L (ref 20–29)
Calcium: 9.9 mg/dL (ref 8.7–10.3)
Chloride: 97 mmol/L (ref 96–106)
Creatinine, Ser: 0.65 mg/dL (ref 0.57–1.00)
GFR calc Af Amer: 90 mL/min/{1.73_m2} (ref >59)
GFR calc non Af Amer: 78 mL/min/{1.73_m2} (ref >59)
Glucose: 104 mg/dL — ABNORMAL HIGH (ref 65–99)
Potassium: 4.7 mmol/L (ref 3.5–5.2)
Sodium: 138 mmol/L (ref 134–144)

## 2018-01-29 LAB — CBC
Hematocrit: 39.5 % (ref 34.0–46.6)
Hemoglobin: 14.2 g/dL (ref 11.1–15.9)
MCH: 36.4 pg — ABNORMAL HIGH (ref 26.6–33.0)
MCHC: 35.9 g/dL — ABNORMAL HIGH (ref 31.5–35.7)
MCV: 101 fL — ABNORMAL HIGH (ref 79–97)
Platelets: 174 10*3/uL (ref 150–450)
RBC: 3.9 x10E6/uL (ref 3.77–5.28)
RDW: 13.4 % (ref 12.3–15.4)
WBC: 6.2 10*3/uL (ref 3.4–10.8)

## 2018-01-29 LAB — TSH: TSH: 3.04 u[IU]/mL (ref 0.450–4.500)

## 2018-02-17 ENCOUNTER — Ambulatory Visit: Payer: Medicare Other | Admitting: Nurse Practitioner

## 2018-02-24 NOTE — Progress Notes (Signed)
Cardiology Office Note:    Date:  02/25/2018   ID:  [name], DOB [birth year], MRN [id number]  PCP:  Josetta Huddle, MD  Cardiologist:  Sinclair Grooms, MD   Referring MD: Josetta Huddle, MD   Chief Complaint  Patient presents with  . Coronary Artery Disease    History of Present Illness:    [name] is a 84 y.o. female with a hx of mitral valve repair, persistent atrial fibrillation, diastolic heart failure, and chronic anticoagulation therapy.  Most recently noted to be in atrial flutter with rapid ventricular response.   [name] is doing well.  Since the rhythm change she notices decreased endurance.  She has to rest more when she is doing chores.  She is not short of breath.  It mostly is just giving out of energy.  She denies orthopnea.  She has not had chest pain.  She has noted some minimal lower extremity edema.  No side effect to the current dose of metoprolol which is now 50 mg twice daily.  Past Medical History:  Diagnosis Date  . Anxiety   . Arthritis    Some in neck and back, but "can't complain"  . Atypical atrial flutter ([name])   . Breast cancer ([place])   . Cancer [facility])    1998 Right breast cancer with lumpectomy and radiation therapy  . Chronic diastolic CHF (congestive heart failure) ([name])   . Complication of anesthesia    Nausea from versed  . GERD (gastroesophageal reflux disease)   . Glaucoma   . Hiatal hernia   . History of mitral valve repair    Mitral valve prolapse, surgery in 2010  . Hyponatremia   . Normocytic anemia 03/21/2014  . Persistent atrial fibrillation ([name])    s/p MAZE 2010  . Personal history of radiation therapy   . Rheumatic fever    age 89  . Thrombocytopenia ([name])     Past Surgical History:  Procedure Laterality Date  . ABDOMINAL HYSTERECTOMY    . APPENDECTOMY    . BACK SURGERY     Synovial cyst on spine, removed laproscopically in 2000?  [name] BREAST BIOPSY    . BREAST LUMPECTOMY     right 1998  . BREAST SURGERY     1998 Lumpectomy in right breast  . CARDIOVERSION  04/29/2012   Procedure: CARDIOVERSION;  Surgeon: [name], MD;  Location: [facility] ENDOSCOPY;  Service: Cardiovascular;  Laterality: N/A;  . CARDIOVERSION  06/19/2012   Procedure: CARDIOVERSION;  Surgeon: Sinclair Grooms, MD;  Location: [facility] ENDOSCOPY;  Service: Cardiovascular;  Laterality: N/A;  h/p from 11/8 in file drawer/dl  . CATARACT EXTRACTION    . CESAREAN SECTION     Three C-Sections  . EYE SURGERY     Cataract Surgery, both eyes  . KIDNEY CYST REMOVAL    . MITRAL VALVE REPAIR  June 2010  . TEE WITHOUT CARDIOVERSION  04/29/2012   Procedure: TRANSESOPHAGEAL ECHOCARDIOGRAM (TEE);  Surgeon: [name], MD;  Location: [facility] ENDOSCOPY;  Service: Cardiovascular;  Laterality: N/A;  . TONSILLECTOMY      Current Medications: Current Meds  Medication Sig  . acetaminophen (TYLENOL) 500 MG tablet Take 500 mg by mouth every 6 (six) hours as needed for mild pain.  [name] apixaban (ELIQUIS) 5 MG TABS tablet Take 1 tablet (5 mg total) by mouth 2 (two) times daily.  . Ascorbic Acid (VITAMIN C) 1000 MG tablet Take 1,000 mg by mouth daily.  [name]  brimonidine (ALPHAGAN) 0.2 % ophthalmic solution Place 1 drop into both eyes 2 (two) times daily.  [name] CHIA SEED PO Take 5 mLs by mouth daily.   . cholecalciferol (VITAMIN D) 1000 UNITS tablet Take 1,000 Units by mouth daily.  . Coenzyme Q10 150 MG CAPS Take by mouth 2 times daily at 12 noon and 4 pm.  . Cyanocobalamin (VITAMIN B-12) 1000 MCG SUBL Place 1,000 mcg under the tongue daily.   . Flaxseed, Linseed, (FLAX SEEDS PO) Take 1 capsule by mouth daily.  . LevOCARNitine (L-CARNITINE) 500 MG TABS Take 500 mg by mouth daily.  . magnesium 30 MG tablet Take 30 mg by mouth daily.  . metoprolol tartrate (LOPRESSOR) 50 MG tablet Take 1 tablet (50 mg total) by mouth 2 (two) times daily.  . Omega-3 Fatty Acids (FISH OIL) 1000 MG CAPS Take 2,000 mg by mouth daily.  [name] OVER THE COUNTER MEDICATION Take 1 each by mouth  daily. Hemp heart seeds  . OVER THE COUNTER MEDICATION Take 1,250 mg by mouth daily. "Omega-Q squid supplement  . PAPAYA PO Take 1 capsule by mouth daily.  . polyethylene glycol (MIRALAX / GLYCOLAX) packet Take 17 g by mouth daily as needed for mild constipation.  . Probiotic Product (PROBIOTIC DAILY PO) Take 1 capsule by mouth daily. 10 Billi CFU   . Taurine 500 MG CAPS Take 500 mg by mouth daily.     Allergies:   Amiodarone; Amoxicillin; Amoxicillin-pot clavulanate; Aspirin; Ciprofloxacin; Codeine; Diuretic [buchu-cornsilk-ch grass-hydran]; Iohexol; Lisinopril; Midazolam hcl; Quinolones; Sotalol; Sulfa antibiotics; Thioxanthenes; Valium; Vancomycin; and Versed [midazolam]   Social History   Socioeconomic History  . Marital status: Widowed    Spouse name: Not on file  . Number of children: Not on file  . Years of education: Not on file  . Highest education level: Not on file  Occupational History  . Not on file  Social Needs  . Financial resource strain: Not on file  . Food insecurity:    Worry: Not on file    Inability: Not on file  . Transportation needs:    Medical: Not on file    Non-medical: Not on file  Tobacco Use  . Smoking status: Never Smoker  . Smokeless tobacco: Never Used  Substance and Sexual Activity  . Alcohol use: No  . Drug use: No  . Sexual activity: Never    Comment: Smoked one cigarette a day as a teen  Lifestyle  . Physical activity:    Days per week: Not on file    Minutes per session: Not on file  . Stress: Not on file  Relationships  . Social connections:    Talks on phone: Not on file    Gets together: Not on file    Attends religious service: Not on file    Active member of club or organization: Not on file    Attends meetings of clubs or organizations: Not on file    Relationship status: Not on file  Other Topics Concern  . Not on file  Social History Narrative   Pt lives in [place] alone.  Homemaker.  Widowed     Family  History: The patient's family history includes Other in her sister.  ROS:   Please see the history of present illness.    None All other systems reviewed and are negative.  EKGs/Labs/Other Studies Reviewed:    The following studies were reviewed today: None  EKG:  EKG is  ordered today.  The ekg ordered  today demonstrates atrial flutter with relatively slow cycle length at about 260 bpm.  Lower rate 86 bpm.  Variable AV conduction.  Otherwise unremarkable.  Recent Labs: 01/28/2018: BUN 15; Creatinine, Ser 0.65; Hemoglobin 14.2; Platelets 174; Potassium 4.7; Sodium 138; TSH 3.040  Recent Lipid Panel No results found for: CHOL, TRIG, HDL, CHOLHDL, VLDL, LDLCALC, LDLDIRECT  Physical Exam:    VS:  BP 122/76   Pulse 86   Ht 5' 2.5" (1.588 m)   Wt 145 lb 9.6 oz (66 kg)   BMI 26.21 kg/m     Wt Readings from Last 3 Encounters:  02/25/18 145 lb 9.6 oz (66 kg)  01/28/18 142 lb 12.8 oz (64.8 kg)  11/15/17 140 lb (63.5 kg)     GEN: Appears younger than stated age.  Well nourished, well developed in no acute distress HEENT: Normal NECK: No JVD. LYMPHATICS: No lymphadenopathy CARDIAC: IIRR, no murmur, no gallop, no  edema. VASCULAR: 2+ radial pulses. no bruits. RESPIRATORY:  Clear to auscultation without rales, wheezing or rhonchi  ABDOMEN: Soft, non-tender, non-distended, No pulsatile mass, MUSCULOSKELETAL: No deformity  SKIN: Warm and dry NEUROLOGIC:  Alert and oriented x 3 PSYCHIATRIC:  Normal affect   ASSESSMENT:    1. Atrial flutter, unspecified type ([place])   2. S/P mitral valve repair   3. Mitral valve disease   4. Anticoagulation goal of INR 2 to 3    PLAN:    In order of problems listed above:  1. Atrial flutter with adequate rate control at rest.  Previous side effects/reactions to amiodarone and sotalol.  We discussed management options that are currently available: EP consultation with consideration of ablation versus cardioversion and medical therapy for rhythm  control (question Multaq vs Dofetilide vs flecainide). 2. No mitral regurgitation. 3. Continue anticoagulation therapy with Coumadin.  After above discussion, the patient is not interested in being on additional medication, ablation, or other active therapy vent rate control.  We discussed that her decreased energy is related to rhythm disturbance when we compare her current status to 6 months ago.  She is willing to except the decreased energy as she has concerned that more aggressive therapy may cause complications at her age.  I do not disagree with her and we will plan to see each other again in 6 to 9 months.   Medication Adjustments/Labs and Tests Ordered: Current medicines are reviewed at length with the patient today.  Concerns regarding medicines are outlined above.  No orders of the defined types were placed in this encounter.  No orders of the defined types were placed in this encounter.   There are no Patient Instructions on file for this visit.   Signed, Sinclair Grooms, MD  02/25/2018 11:51 AM    [place]

## 2018-02-25 ENCOUNTER — Ambulatory Visit (INDEPENDENT_AMBULATORY_CARE_PROVIDER_SITE_OTHER): Payer: Medicare Other | Admitting: Interventional Cardiology

## 2018-02-25 ENCOUNTER — Encounter: Payer: Self-pay | Admitting: Interventional Cardiology

## 2018-02-25 VITALS — BP 122/76 | HR 86 | Ht 62.5 in | Wt 145.6 lb

## 2018-02-25 DIAGNOSIS — Z7901 Long term (current) use of anticoagulants: Secondary | ICD-10-CM

## 2018-02-25 DIAGNOSIS — Z9889 Other specified postprocedural states: Secondary | ICD-10-CM

## 2018-02-25 DIAGNOSIS — I4892 Unspecified atrial flutter: Secondary | ICD-10-CM | POA: Diagnosis not present

## 2018-02-25 DIAGNOSIS — Z5181 Encounter for therapeutic drug level monitoring: Secondary | ICD-10-CM

## 2018-02-25 NOTE — Patient Instructions (Signed)
Medication Instructions:  Your physician recommends that you continue on your current medications as directed. Please refer to the Current Medication list given to you today.   Labwork: None  Testing/Procedures: None  Follow-Up: Your physician wants you to follow-up in: 6-9 months with Dr. Smith. You will receive a reminder letter in the mail two months in advance. If you don't receive a letter, please call our office to schedule the follow-up appointment.   Any Other Special Instructions Will Be Listed Below (If Applicable).     If you need a refill on your cardiac medications before your next appointment, please call your pharmacy.   

## 2018-03-04 ENCOUNTER — Telehealth (HOSPITAL_COMMUNITY): Payer: Self-pay | Admitting: Interventional Cardiology

## 2018-03-04 DIAGNOSIS — I4819 Other persistent atrial fibrillation: Secondary | ICD-10-CM

## 2018-03-04 NOTE — Telephone Encounter (Signed)
New Message:    Pt would like for you to call please, she wants to talk to you about the procedure that Dr Tamala Julian wants her to have.

## 2018-03-04 NOTE — Telephone Encounter (Signed)
Spoke with pt and she was concerned about the medications Dr. Tamala Julian had mentioned at previous OV (Dofetilide, Multaq, Flecainide).  Pt seen by Dr. Rayann Heman in 2014.  She would like to know which EP doctor Dr. Tamala Julian was planning to refer her to?  Pt would like a second opinion from Dr. Rayann Heman.  Advised I would send to Dr. Tamala Julian for advisement.  Pt appreciative for call.

## 2018-03-05 NOTE — Telephone Encounter (Signed)
We decided on medical therapy.  She did not want referral to EP.  If she has changed her mind, it is fine for her to see Dr. Rayann Heman to determine if any additional therapy would help with rhythm control.

## 2018-03-07 NOTE — Telephone Encounter (Signed)
Spoke with Dr. Tamala Julian. Ok to refer pt to Dr. Lovena Le.

## 2018-03-19 DIAGNOSIS — Z79899 Other long term (current) drug therapy: Secondary | ICD-10-CM | POA: Diagnosis not present

## 2018-03-19 DIAGNOSIS — Z0001 Encounter for general adult medical examination with abnormal findings: Secondary | ICD-10-CM | POA: Diagnosis not present

## 2018-03-19 DIAGNOSIS — I10 Essential (primary) hypertension: Secondary | ICD-10-CM | POA: Diagnosis not present

## 2018-03-19 DIAGNOSIS — C50919 Malignant neoplasm of unspecified site of unspecified female breast: Secondary | ICD-10-CM | POA: Diagnosis not present

## 2018-03-19 DIAGNOSIS — H409 Unspecified glaucoma: Secondary | ICD-10-CM | POA: Diagnosis not present

## 2018-03-19 DIAGNOSIS — I509 Heart failure, unspecified: Secondary | ICD-10-CM | POA: Diagnosis not present

## 2018-03-19 DIAGNOSIS — Z23 Encounter for immunization: Secondary | ICD-10-CM | POA: Diagnosis not present

## 2018-03-19 DIAGNOSIS — E782 Mixed hyperlipidemia: Secondary | ICD-10-CM | POA: Diagnosis not present

## 2018-03-19 DIAGNOSIS — D7589 Other specified diseases of blood and blood-forming organs: Secondary | ICD-10-CM | POA: Diagnosis not present

## 2018-03-19 DIAGNOSIS — N3946 Mixed incontinence: Secondary | ICD-10-CM | POA: Diagnosis not present

## 2018-03-19 DIAGNOSIS — Z1389 Encounter for screening for other disorder: Secondary | ICD-10-CM | POA: Diagnosis not present

## 2018-03-19 DIAGNOSIS — I4891 Unspecified atrial fibrillation: Secondary | ICD-10-CM | POA: Diagnosis not present

## 2018-04-15 ENCOUNTER — Ambulatory Visit (INDEPENDENT_AMBULATORY_CARE_PROVIDER_SITE_OTHER): Payer: Medicare Other | Admitting: Cardiology

## 2018-04-15 ENCOUNTER — Encounter (INDEPENDENT_AMBULATORY_CARE_PROVIDER_SITE_OTHER): Payer: Self-pay

## 2018-04-15 ENCOUNTER — Encounter: Payer: Self-pay | Admitting: Cardiology

## 2018-04-15 VITALS — BP 110/68 | HR 84 | Ht 62.0 in | Wt 141.4 lb

## 2018-04-15 DIAGNOSIS — I4811 Longstanding persistent atrial fibrillation: Secondary | ICD-10-CM | POA: Diagnosis not present

## 2018-04-15 DIAGNOSIS — I484 Atypical atrial flutter: Secondary | ICD-10-CM | POA: Diagnosis not present

## 2018-04-15 DIAGNOSIS — Z9889 Other specified postprocedural states: Secondary | ICD-10-CM

## 2018-04-15 MED ORDER — DILTIAZEM HCL ER COATED BEADS 180 MG PO CP24: 180.0000 mg | ORAL_CAPSULE | Freq: Every day | ORAL | 3 refills | Status: DC

## 2018-04-15 NOTE — Patient Instructions (Addendum)
Medication Instructions:  Your physician has recommended you make the following change in your medication:  1. STOP Metoprolol 2. START Diltiazem 180 mg once daily  If you need a refill on your cardiac medications before your next appointment, please call your pharmacy.   Lab work: None ordered  Testing/Procedures: None ordered  Follow-Up: At [facility], you and your health needs are our priority.  As part of our continuing mission to provide you with exceptional heart care, we have created designated Provider Care Teams.  These Care Teams include your primary Cardiologist (physician) and Advanced Practice Providers (APPs -  Physician Assistants and Nurse Practitioners) who all work together to provide you with the care you need, when you need it. You will need a follow up appointment in 3 months.  Please call our office 2 months in advance to schedule this appointment.  You may see Dr. Curt Bears or one of the following Advanced Practice Providers on your designated Care Team:   Chanetta Marshall, NP . Tommye Standard, PA-C  Thank you for choosing [facility]!!   Trinidad Curet, RN [phone]

## 2018-04-15 NOTE — Progress Notes (Signed)
Electrophysiology Office Note   Date:  04/15/2018   ID:  [name], DOB [birth year], MRN [id number]  PCP:  Tara Huddle, MD  Cardiologist:  Tamala Julian Primary Electrophysiologist:  Tara Guard Meredith Leeds, MD    No chief complaint on file.    History of Present Illness: [name] is a 84 y.o. female who is being seen today for the evaluation of atrial flutter at the request of [name]. Presenting today for electrophysiology evaluation.  She has a history of atypical atrial flutter, breast cancer status post lumpectomy and radiation, diastolic heart failure, mitral valve repair in 2010, persistent atrial fibrillation status post maze in 2010.  She was noted to be in atrial flutter with rapid ventricular response.  Her main complaint is decreased endurance.  She has to rest more while doing chores.  She has not had shortness of breath.  She says that she just gives out of energy.  Today, she denies symptoms of palpitations, chest pain, shortness of breath, orthopnea, PND, lower extremity edema, claudication, dizziness, presyncope, syncope, bleeding, or neurologic sequela. The patient is tolerating medications without difficulties.    Past Medical History:  Diagnosis Date  . Anxiety   . Arthritis    Some in neck and back, but "can't complain"  . Atypical atrial flutter ([place])   . Breast cancer ([name])   . Cancer [facility])    1998 Right breast cancer with lumpectomy and radiation therapy  . Chronic diastolic CHF (congestive heart failure) ([place])   . Complication of anesthesia    Nausea from versed  . GERD (gastroesophageal reflux disease)   . Glaucoma   . Hiatal hernia   . History of mitral valve repair    Mitral valve prolapse, surgery in 2010  . Hyponatremia   . Normocytic anemia 03/21/2014  . Persistent atrial fibrillation    s/p MAZE 2010  . Personal history of radiation therapy   . Rheumatic fever    age 38  . Thrombocytopenia ([place])    Past Surgical History:  Procedure  Laterality Date  . ABDOMINAL HYSTERECTOMY    . APPENDECTOMY    . BACK SURGERY     Synovial cyst on spine, removed laproscopically in 2000?  [name] BREAST BIOPSY    . BREAST LUMPECTOMY     right 1998  . BREAST SURGERY     1998 Lumpectomy in right breast  . CARDIAC CATHETERIZATION    . CARDIOVERSION  04/29/2012   Procedure: CARDIOVERSION;  Surgeon: [name], MD;  Location: [facility] ENDOSCOPY;  Service: Cardiovascular;  Laterality: N/A;  . CARDIOVERSION  06/19/2012   Procedure: CARDIOVERSION;  Surgeon: Sinclair Grooms, MD;  Location[facility] ENDOSCOPY;  Service: Cardiovascular;  Laterality: N/A;  h/p from 11/8 in file drawer/dl  . CATARACT EXTRACTION    . CESAREAN SECTION     Three C-Sections  . EYE SURGERY     Cataract Surgery, both eyes  . KIDNEY CYST REMOVAL    . MITRAL VALVE REPAIR  June 2010  . TEE WITHOUT CARDIOVERSION  04/29/2012   Procedure: TRANSESOPHAGEAL ECHOCARDIOGRAM (TEE);  Surgeon: [name], MD;  Location: [facility] ENDOSCOPY;  Service: Cardiovascular;  Laterality: N/A;  . TONSILLECTOMY       Current Outpatient Medications  Medication Sig Dispense Refill  . acetaminophen (TYLENOL) 500 MG tablet Take 500 mg by mouth every 6 (six) hours as needed for mild pain.    [name] apixaban (ELIQUIS) 5 MG TABS tablet Take 1 tablet (5 mg total)  by mouth 2 (two) times daily. 60 tablet 11  . Ascorbic Acid (VITAMIN C) 1000 MG tablet Take 1,000 mg by mouth daily.    . brimonidine (ALPHAGAN) 0.2 % ophthalmic solution Place 1 drop into both eyes 2 (two) times daily.    [name] CHIA SEED PO Take 5 mLs by mouth daily.     . cholecalciferol (VITAMIN D) 1000 UNITS tablet Take 1,000 Units by mouth daily.    . Coenzyme Q10 150 MG CAPS Take by mouth 2 times daily at 12 noon and 4 pm.    . Cyanocobalamin (VITAMIN B-12) 1000 MCG SUBL Place 1,000 mcg under the tongue daily.     . Flaxseed, Linseed, (FLAX SEEDS PO) Take 1 capsule by mouth daily.    . LevOCARNitine (L-CARNITINE) 500 MG TABS Take 500 mg by mouth  daily.    . magnesium 30 MG tablet Take 30 mg by mouth daily.    . Omega-3 Fatty Acids (FISH OIL) 1000 MG CAPS Take 2,000 mg by mouth daily.    [name] OVER THE COUNTER MEDICATION Take 1 each by mouth daily. Hemp heart seeds    . OVER THE COUNTER MEDICATION Take 1,250 mg by mouth daily. "Omega-Q squid supplement    . PAPAYA PO Take 1 capsule by mouth daily.    . polyethylene glycol (MIRALAX / GLYCOLAX) packet Take 17 g by mouth daily as needed for mild constipation.    . Probiotic Product (PROBIOTIC DAILY PO) Take 1 capsule by mouth daily. 10 Billi CFU     . Taurine 500 MG CAPS Take 500 mg by mouth daily.     No current facility-administered medications for this visit.     Allergies:   Amiodarone; Amoxicillin; Amoxicillin-pot clavulanate; Aspirin; Ciprofloxacin; Codeine; Diuretic [buchu-cornsilk-ch grass-hydran]; Iohexol; Lisinopril; Midazolam hcl; Quinolones; Sotalol; Sulfa antibiotics; Thioxanthenes; Valium; Vancomycin; and Versed [midazolam]   Social History:  The patient  reports that she has never smoked. She has never used smokeless tobacco. She reports that she does not drink alcohol or use drugs.   Family History:  The patient's family history includes Brain cancer in her sister; Cancer in her maternal grandfather; Heart attack in her father; Heart disease in her mother; Other in her sister.    ROS:  Please see the history of present illness.   Otherwise, review of systems is positive for palpitations.   All other systems are reviewed and negative.    PHYSICAL EXAM: VS:  BP 110/68   Pulse 84   Ht 5\' 2"  (1.575 m)   Wt 141 lb 6.4 oz (64.1 kg)   SpO2 98%   BMI 25.86 kg/m  , BMI Body mass index is 25.86 kg/m. GEN: Well nourished, well developed, in no acute distress  HEENT: normal  Neck: no JVD, carotid bruits, or masses Cardiac: iRRR; no murmurs, rubs, or gallops,no edema  Respiratory:  clear to auscultation bilaterally, normal work of breathing GI: soft, nontender, nondistended,  + BS MS: no deformity or atrophy  Skin: warm and dry Neuro:  Strength and sensation are intact Psych: euthymic mood, full affect  EKG:  EKG is ordered today. Personal review of the ekg ordered shows atrial flutter, rate 84  Recent Labs: 01/28/2018: BUN 15; Creatinine, Ser 0.65; Hemoglobin 14.2; Platelets 174; Potassium 4.7; Sodium 138; TSH 3.040    Lipid Panel  No results found for: CHOL, TRIG, HDL, CHOLHDL, VLDL, LDLCALC, LDLDIRECT   Wt Readings from Last 3 Encounters:  04/15/18 141 lb 6.4 oz (64.1 kg)  02/25/18 145 lb 9.6 oz (66 kg)  01/28/18 142 lb 12.8 oz (64.8 kg)      Other studies Reviewed: Additional studies/ records that were reviewed today include: Holter 09/10/17 - personally reviewed  Review of the above records today demonstrates:   Continuous atrial flutter with controlled ventricular response  No pauses greater than 2.2 seconds  No complaints/diary entries   Continuous atrial flutter with adequate rate control.  No significant bradycardia.   ASSESSMENT AND PLAN:  1.  Atrial flutter: On EKG, it does appear to be atypical.  And going back to her EKG she has been out of rhythm since at least 2015.  Due to that, I do not feel that she would be a good candidate for a rhythm control strategy.  Also, since she has had a Maze procedure, her atrial flutter does appear to be atypical, possibly left atrial.  She would likely benefit most from a rate control strategy.  She is having some low blood pressures on metoprolol.  We Labria Wos switch her to diltiazem 180 mg.  2.  Mitral valve repair: No noted mitral regurgitation  3.  Atrial fibrillation: Status post maze in 2010.  This patients CHA2DS2-VASc Score and unadjusted Ischemic Stroke Rate (% per year) is equal to 3.2 % stroke rate/year from a score of 3  Above score calculated as 1 point each if present [CHF, HTN, DM, Vascular=MI/PAD/Aortic Plaque, Age if 65-74, or Female] Above score calculated as 2 points each if  present [Age > 75, or Stroke/TIA/TE]   Current medicines are reviewed at length with the patient today.   The patient does not have concerns regarding her medicines.  The following changes were made today:  Stop metoprolol, start diltiazemCase discussed with primary cardiology  Labs/ tests ordered today include:  Orders Placed This Encounter  Procedures  . EKG 12-Lead     Disposition:   FU with Jyssica Rief 3 months  Signed, Windle Huebert Meredith Leeds, MD  04/15/2018 10:37 AM     [facility] [address] [place] [address] [phone] (office) [phone] (fax)

## 2018-04-17 DIAGNOSIS — R350 Frequency of micturition: Secondary | ICD-10-CM | POA: Diagnosis not present

## 2018-04-17 DIAGNOSIS — I509 Heart failure, unspecified: Secondary | ICD-10-CM | POA: Diagnosis not present

## 2018-04-17 DIAGNOSIS — I4891 Unspecified atrial fibrillation: Secondary | ICD-10-CM | POA: Diagnosis not present

## 2018-04-17 DIAGNOSIS — R32 Unspecified urinary incontinence: Secondary | ICD-10-CM | POA: Diagnosis not present

## 2018-04-18 ENCOUNTER — Telehealth: Payer: Self-pay | Admitting: Cardiology

## 2018-04-18 NOTE — Telephone Encounter (Signed)
Pt called to report that she has been having bilateral foot and ankle edema since yesterday. She thinks it is related to starting the Diltiazem (Cardizem CD) 180mg  on 04/15/18.. She denies any other symptoms such as SOB...  Her BP this morning since she has not taken her meds yet was 122/72 and HR 109. Advised her that I will froward to Dr. Curt Bears and the Pharmacist for their recommendations.

## 2018-04-18 NOTE — Telephone Encounter (Signed)
° ° °  Pt c/o medication issue:  1. Name of Medication: diltiazem (CARDIZEM CD) 180 MG 24 hr capsule  2. How are you currently taking this medication (dosage and times per day)? Take 1 capsule (180 mg total) by mouth daily.  3. Are you having a reaction (difficulty breathing--STAT)? no  4. What is your medication issue? Patient states medication is causing swelling and nasal congestion    1) How much weight have you gained and in what time span? n/a  2) If swelling, where is the swelling located? Feet and legs  3) Are you currently taking a fluid pill? No  4) Are you currently SOB? No  5) Do you have a log of your daily weights (if so, list)? No  6) Have you gained 3 pounds in a day or 5 pounds in a week? No  7) Have you traveled recently? No

## 2018-04-18 NOTE — Telephone Encounter (Signed)
This can be related to CCB. It looks like she was previously on a betablocker and developed low blood pressures. Would consider dose reduction of dilt to see if edema improves or trial of low dose bisoprolol (since it appears she has tried atenolol and metoprolol in the past).

## 2018-04-21 ENCOUNTER — Telehealth: Payer: Self-pay | Admitting: Cardiology

## 2018-04-21 NOTE — Telephone Encounter (Signed)
New Message   Pt c/o medication issue:  1. Name of Medication: diltiazem (CARDIZEM CD) 180 MG 24 hr capsule  2. How are you currently taking this medication (dosage and times per day)? Take 1 capsule (180 mg total) by mouth daily.  3. Are you having a reaction (difficulty breathing--STAT)?  no  4. What is your medication issue? Pt states that the medication is causing her to have a his heart rate, as high as 137. Please call

## 2018-04-21 NOTE — Telephone Encounter (Signed)
Called patient who states her heart rate has been high since starting Diltizem on Wednesday 10/9 and HR has remained high each day. States on Sat at 2 pm HR 137 bpm and 127 bpm earlier that day States bilateral lower extremities are so swollen that she cannot get shoes on HR today at 0615 is 114 bpm and she has not yet taken Diltiazem. She asks why she cannot go back on metorprolol. States last dose was metoprolol tartrate 50 mg BID which made her dizzy and lethargic. She asks if she can take 1/2 that dose instead of diltiazem. I reviewed her chart and advised that she has been on that dose in the past and it was increased due to tachycardia. She states she does not recall the reason but does remember it being increased. I advised her that I will forward message to Dr. Curt Bears for review and will call her back later with his advice. She verbalized understanding and agreement and thanked me for the call.

## 2018-04-21 NOTE — Telephone Encounter (Signed)
Pt has not taken Diltiazem today.  She took 1/2 Metoprolol = 25 mg this morning. Advised Dr. Curt Bears recommends stopping Diltiazem, starting Bisoprolol 5 mg. Pt would like to go back on Metoprolol if Dr. Curt Bears is agreeable.  She understands I will discuss this w/ him tomorrow and let her know his recommendation. Pt is agreeable to plan.

## 2018-04-22 MED ORDER — METOPROLOL SUCCINATE ER 25 MG PO TB24: 25.0000 mg | ORAL_TABLET | Freq: Two times a day (BID) | ORAL | 6 refills | Status: DC

## 2018-04-22 NOTE — Telephone Encounter (Signed)
Advised pt she may return Toprol 25 mg BID per Dr. Curt Bears. She would like to start at 25, instead of the 50 she was on d/t low BPs. Pt is very agreeable to plan. She will call office if low BPs begin after re-starting Toprol Medications updated in chart.

## 2018-04-23 DIAGNOSIS — H401122 Primary open-angle glaucoma, left eye, moderate stage: Secondary | ICD-10-CM | POA: Diagnosis not present

## 2018-04-23 DIAGNOSIS — H401112 Primary open-angle glaucoma, right eye, moderate stage: Secondary | ICD-10-CM | POA: Diagnosis not present

## 2018-04-29 ENCOUNTER — Telehealth: Payer: Self-pay | Admitting: Cardiology

## 2018-04-29 DIAGNOSIS — C50919 Malignant neoplasm of unspecified site of unspecified female breast: Secondary | ICD-10-CM | POA: Diagnosis not present

## 2018-04-29 DIAGNOSIS — I5033 Acute on chronic diastolic (congestive) heart failure: Secondary | ICD-10-CM | POA: Diagnosis not present

## 2018-04-29 DIAGNOSIS — D500 Iron deficiency anemia secondary to blood loss (chronic): Secondary | ICD-10-CM | POA: Diagnosis not present

## 2018-04-29 DIAGNOSIS — E782 Mixed hyperlipidemia: Secondary | ICD-10-CM | POA: Diagnosis not present

## 2018-04-29 DIAGNOSIS — I10 Essential (primary) hypertension: Secondary | ICD-10-CM | POA: Diagnosis not present

## 2018-04-29 DIAGNOSIS — I4891 Unspecified atrial fibrillation: Secondary | ICD-10-CM | POA: Diagnosis not present

## 2018-04-29 DIAGNOSIS — I509 Heart failure, unspecified: Secondary | ICD-10-CM | POA: Diagnosis not present

## 2018-04-29 DIAGNOSIS — H409 Unspecified glaucoma: Secondary | ICD-10-CM | POA: Diagnosis not present

## 2018-04-29 NOTE — Telephone Encounter (Signed)
Informed that pt has stopped Diltiazem d/t SE and has resumed Toprol.

## 2018-04-29 NOTE — Telephone Encounter (Signed)
New Message:     Please call Tammy, she needs to get clarification on pt's medicine, Metoprolol and Diltiazem.

## 2018-04-30 DIAGNOSIS — N3090 Cystitis, unspecified without hematuria: Secondary | ICD-10-CM | POA: Diagnosis not present

## 2018-07-08 DIAGNOSIS — I10 Essential (primary) hypertension: Secondary | ICD-10-CM | POA: Diagnosis not present

## 2018-07-08 DIAGNOSIS — C50919 Malignant neoplasm of unspecified site of unspecified female breast: Secondary | ICD-10-CM | POA: Diagnosis not present

## 2018-07-08 DIAGNOSIS — I4891 Unspecified atrial fibrillation: Secondary | ICD-10-CM | POA: Diagnosis not present

## 2018-07-08 DIAGNOSIS — D500 Iron deficiency anemia secondary to blood loss (chronic): Secondary | ICD-10-CM | POA: Diagnosis not present

## 2018-07-08 DIAGNOSIS — H409 Unspecified glaucoma: Secondary | ICD-10-CM | POA: Diagnosis not present

## 2018-07-08 DIAGNOSIS — I509 Heart failure, unspecified: Secondary | ICD-10-CM | POA: Diagnosis not present

## 2018-07-08 DIAGNOSIS — E782 Mixed hyperlipidemia: Secondary | ICD-10-CM | POA: Diagnosis not present

## 2018-07-08 DIAGNOSIS — I5033 Acute on chronic diastolic (congestive) heart failure: Secondary | ICD-10-CM | POA: Diagnosis not present

## 2018-07-16 DIAGNOSIS — J40 Bronchitis, not specified as acute or chronic: Secondary | ICD-10-CM | POA: Diagnosis not present

## 2018-07-16 DIAGNOSIS — N393 Stress incontinence (female) (male): Secondary | ICD-10-CM | POA: Diagnosis not present

## 2018-07-22 ENCOUNTER — Ambulatory Visit (INDEPENDENT_AMBULATORY_CARE_PROVIDER_SITE_OTHER): Payer: Medicare Other | Admitting: Cardiology

## 2018-07-22 ENCOUNTER — Encounter: Payer: Self-pay | Admitting: Cardiology

## 2018-07-22 ENCOUNTER — Encounter (INDEPENDENT_AMBULATORY_CARE_PROVIDER_SITE_OTHER): Payer: Self-pay

## 2018-07-22 VITALS — BP 122/62 | HR 83 | Ht 62.0 in | Wt 140.0 lb

## 2018-07-22 DIAGNOSIS — I484 Atypical atrial flutter: Secondary | ICD-10-CM

## 2018-07-22 DIAGNOSIS — I4819 Other persistent atrial fibrillation: Secondary | ICD-10-CM

## 2018-07-22 DIAGNOSIS — I480 Paroxysmal atrial fibrillation: Secondary | ICD-10-CM | POA: Diagnosis not present

## 2018-07-22 NOTE — Progress Notes (Signed)
Electrophysiology Office Note   Date:  07/22/2018   ID:  [name], DOB March 02, 1926, MRN [id number]  PCP:  Josetta Huddle, MD  Cardiologist:  Tamala Julian Primary Electrophysiologist:  Dr Curt Bears    CC: Follow up for atrial fibrillation/atypical flutter   History of Present Illness: [name] is a 85 y.o. female who is being seen today for the evaluation of atrial flutter at the request of [name]. Presenting today for electrophysiology evaluation.  She has a history of atypical atrial flutter, breast cancer status post lumpectomy and radiation, diastolic heart failure, mitral valve repair in 2010, persistent atrial fibrillation status post maze in 2010.  She was noted to be in atrial flutter with rapid ventricular response.  Her main complaint is decreased endurance. Recently, she has felt poorly 2/2 an URI which has improved somewhat after antibiotic treatment. She tried taking diltiazem but had intolerable leg swelling and is now back on her metoprolol. Overall, she feels well and has not noticed much heart racing.   Today, denies symptoms of palpitations, chest pain, shortness of breath, orthopnea, PND, lower extremity edema, claudication, dizziness, presyncope, syncope, bleeding, or neurologic sequela. The patient is tolerating medications without difficulties.     Past Medical History:  Diagnosis Date  . Anxiety   . Arthritis    Some in neck and back, but "can't complain"  . Atypical atrial flutter ([place])   . Breast cancer ([place])   . Cancer [facility])    1998 Right breast cancer with lumpectomy and radiation therapy  . Chronic diastolic CHF (congestive heart failure) ([place])   . Complication of anesthesia    Nausea from versed  . GERD (gastroesophageal reflux disease)   . Glaucoma   . Hiatal hernia   . History of mitral valve repair    Mitral valve prolapse, surgery in 2010  . Hyponatremia   . Normocytic anemia 03/21/2014  . Persistent atrial fibrillation    s/p MAZE 2010  .  Personal history of radiation therapy   . Rheumatic fever    age 17  . Thrombocytopenia ([place])    Past Surgical History:  Procedure Laterality Date  . ABDOMINAL HYSTERECTOMY    . APPENDECTOMY    . BACK SURGERY     Synovial cyst on spine, removed laproscopically in 2000?  [name] BREAST BIOPSY    . BREAST LUMPECTOMY     right 1998  . BREAST SURGERY     1998 Lumpectomy in right breast  . CARDIAC CATHETERIZATION    . CARDIOVERSION  04/29/2012   Procedure: CARDIOVERSION;  Surgeon: [name], MD;  Location: [facility] ENDOSCOPY;  Service: Cardiovascular;  Laterality: N/A;  . CARDIOVERSION  06/19/2012   Procedure: CARDIOVERSION;  Surgeon: Sinclair Grooms, MD;  Location: [facility] ENDOSCOPY;  Service: Cardiovascular;  Laterality: N/A;  h/p from 11/8 in file drawer/dl  . CATARACT EXTRACTION    . CESAREAN SECTION     Three C-Sections  . EYE SURGERY     Cataract Surgery, both eyes  . KIDNEY CYST REMOVAL    . MITRAL VALVE REPAIR  June 2010  . TEE WITHOUT CARDIOVERSION  04/29/2012   Procedure: TRANSESOPHAGEAL ECHOCARDIOGRAM (TEE);  Surgeon: [name], MD;  Location: [facility] ENDOSCOPY;  Service: Cardiovascular;  Laterality: N/A;  . TONSILLECTOMY       Current Outpatient Medications  Medication Sig Dispense Refill  . acetaminophen (TYLENOL) 500 MG tablet Take 500 mg by mouth every 6 (six) hours as needed for mild pain.    [name]  apixaban (ELIQUIS) 5 MG TABS tablet Take 1 tablet (5 mg total) by mouth 2 (two) times daily. 60 tablet 11  . Ascorbic Acid (VITAMIN C) 1000 MG tablet Take 1,000 mg by mouth daily.    . brimonidine (ALPHAGAN) 0.2 % ophthalmic solution Place 1 drop into both eyes 2 (two) times daily.    [name] CHIA SEED PO Take 5 mLs by mouth daily.     . cholecalciferol (VITAMIN D) 1000 UNITS tablet Take 1,000 Units by mouth daily.    . Coenzyme Q10 150 MG CAPS Take by mouth 2 times daily at 12 noon and 4 pm.    . Cyanocobalamin (VITAMIN B-12) 1000 MCG SUBL Place 1,000 mcg under the tongue daily.     .  Flaxseed, Linseed, (FLAX SEEDS PO) Take 1 capsule by mouth daily.    . LevOCARNitine (L-CARNITINE) 500 MG TABS Take 500 mg by mouth daily.    . magnesium 30 MG tablet Take 30 mg by mouth daily.    . metoprolol succinate (TOPROL-XL) 50 MG 24 hr tablet Take 50 mg by mouth 2 (two) times daily. Take with or immediately following a meal.    . Omega-3 Fatty Acids (FISH OIL) 1000 MG CAPS Take 2,000 mg by mouth daily.    [name] OVER THE COUNTER MEDICATION Take 1 each by mouth daily. Hemp heart seeds    . OVER THE COUNTER MEDICATION Take 1,250 mg by mouth daily. "Omega-Q squid supplement    . PAPAYA PO Take 1 capsule by mouth daily.    . polyethylene glycol (MIRALAX / GLYCOLAX) packet Take 17 g by mouth daily as needed for mild constipation.    . Probiotic Product (PROBIOTIC DAILY PO) Take 1 capsule by mouth daily. 10 Billi CFU     . Taurine 500 MG CAPS Take 500 mg by mouth daily.     No current facility-administered medications for this visit.     Allergies:   Amiodarone; Amoxicillin; Amoxicillin-pot clavulanate; Aspirin; Ciprofloxacin; Codeine; Diuretic [buchu-cornsilk-ch grass-hydran]; Iohexol; Lisinopril; Midazolam hcl; Quinolones; Sotalol; Sulfa antibiotics; Thioxanthenes; Valium; Vancomycin; and Versed [midazolam]   Social History:  The patient  reports that she has never smoked. She has never used smokeless tobacco. She reports that she does not drink alcohol or use drugs.   Family History:  The patient's family history includes Brain cancer in her sister; Cancer in her maternal grandfather; Heart attack in her father; Heart disease in her mother; Other in her sister.    ROS:  Please see the history of present illness.   Otherwise, review of systems is positive for nausea, cough, wheezing, recent chills.   All other systems are reviewed and negative.   PHYSICAL EXAM: VS:  BP 122/62   Pulse 83   Ht 5\' 2"  (1.575 m)   Wt 140 lb (63.5 kg)   BMI 25.61 kg/m  , BMI Body mass index is 25.61  kg/m. GEN: Well nourished, well developed elderly female in no acute distress  HEENT: normal  Neck: no JVD, carotid bruits, or masses Cardiac: irregularly irregular; no murmurs, rubs, or gallops,no edema  Respiratory:  clear to auscultation bilaterally, normal work of breathing GI: soft, nontender, nondistended, + BS MS: no deformity or atrophy  Skin: warm and dry Neuro:  Strength and sensation are intact Psych: euthymic mood, full affect  EKG:  EKG is ordered today. Personal review of the ekg ordered shows atrial flutter with variable AV block HR 83, RAD, QRS 78, QTc 472   Recent Labs:  01/28/2018: BUN 15; Creatinine, Ser 0.65; Hemoglobin 14.2; Platelets 174; Potassium 4.7; Sodium 138; TSH 3.040    Lipid Panel  No results found for: CHOL, TRIG, HDL, CHOLHDL, VLDL, LDLCALC, LDLDIRECT   Wt Readings from Last 3 Encounters:  07/22/18 140 lb (63.5 kg)  04/15/18 141 lb 6.4 oz (64.1 kg)  02/25/18 145 lb 9.6 oz (66 kg)      Other studies Reviewed: Additional studies/ records that were reviewed today include: Holter 09/10/17 - personally reviewed  Review of the above records today demonstrates:   Continuous atrial flutter with controlled ventricular response  No pauses greater than 2.2 seconds  No complaints/diary entries   Continuous atrial flutter with adequate rate control.  No significant bradycardia.   ASSESSMENT AND PLAN:  1.  Permanent Atrial flutter: On EKG, it does appear to be atypical.  And going back to her EKG she has been out of rhythm since at least 2015.   Had intolerable side effects with diltiazem. We discussed the option of AV node ablation w/ pacemaker implant if we are unable to achieve good rate control. She prefers at this time to continue her BB as it appears to be controlling her heart rate.  Rate controlled on Toprol 50 mg BID Continue Eliquis 5 mg BID  2.  Mitral valve repair: No noted mitral regurgitation  3.  Atrial fibrillation: Status post maze  in 2010.  This patients CHA2DS2-VASc Score and unadjusted Ischemic Stroke Rate (% per year) is equal to 3.2 % stroke rate/year from a score of 3  Above score calculated as 1 point each if present [CHF, HTN, DM, Vascular=MI/PAD/Aortic Plaque, Age if 65-74, or Female] Above score calculated as 2 points each if present [Age > 75, or Stroke/TIA/TE]   Current medicines are reviewed at length with the patient today.   The patient does not have concerns regarding her medicines.  The following changes were made today:  none  Labs/ tests ordered today include:  Orders Placed This Encounter  Procedures  . EKG 12-Lead     Disposition:   FU with Prairie Stenberg 6 months  Johann Capers, [place]  07/22/2018 11:46 AM     [facility] [address] [place] [address] [phone] (office) [phone] (fax)  I have seen and examined this patient with Adline Peals.  Agree with above, note added to reflect my findings.  On exam, iRRR, no murmurs, lungs clear.  It remains an atypical atrial flutter.  She did not tolerate the diltiazem and thus has been switched back to metoprolol.  She is tolerating metoprolol without issue.  We Olympia Adelsberger thus continue this medication with no changes.  She has not had signs of tachycardia.  Ventura Hollenbeck M. Mccabe Gloria MD 07/22/2018 3:02 PM

## 2018-07-22 NOTE — Patient Instructions (Addendum)
Medication Instructions:  Your physician recommends that you continue on your current medications as directed. Please refer to the Current Medication list given to you today.  If you need a refill on your cardiac medications before your next appointment, please call your pharmacy.   Labwork: None ordered  Testing/Procedures: None ordered  Follow-Up: Your physician wants you to follow-up in: 6 months with Dr. Camnitz.  You will receive a reminder letter in the mail two months in advance. If you don't receive a letter, please call our office to schedule the follow-up appointment.  Thank you for choosing [facility]!!   Ellington Cornia, RN [phone]         

## 2018-07-28 DIAGNOSIS — R300 Dysuria: Secondary | ICD-10-CM | POA: Diagnosis not present

## 2018-09-03 ENCOUNTER — Encounter

## 2018-09-03 ENCOUNTER — Ambulatory Visit (INDEPENDENT_AMBULATORY_CARE_PROVIDER_SITE_OTHER): Payer: Medicare Other | Admitting: Interventional Cardiology

## 2018-09-03 ENCOUNTER — Encounter: Payer: Self-pay | Admitting: Interventional Cardiology

## 2018-09-03 VITALS — BP 124/88 | HR 108 | Ht 62.0 in | Wt 145.0 lb

## 2018-09-03 DIAGNOSIS — Z5181 Encounter for therapeutic drug level monitoring: Secondary | ICD-10-CM

## 2018-09-03 DIAGNOSIS — I4819 Other persistent atrial fibrillation: Secondary | ICD-10-CM

## 2018-09-03 DIAGNOSIS — Z9889 Other specified postprocedural states: Secondary | ICD-10-CM

## 2018-09-03 DIAGNOSIS — Z7901 Long term (current) use of anticoagulants: Secondary | ICD-10-CM | POA: Diagnosis not present

## 2018-09-03 DIAGNOSIS — R0602 Shortness of breath: Secondary | ICD-10-CM | POA: Diagnosis not present

## 2018-09-03 NOTE — Progress Notes (Signed)
Cardiology Office Note:    Date:  09/03/2018   ID:  [name], DOB November 01, 1925, MRN [id number]  PCP:  Josetta Huddle, MD  Cardiologist:  Sinclair Grooms, MD   Referring MD: Josetta Huddle, MD   Chief Complaint  Patient presents with  . Cardiac Valve Problem  . Atrial Fibrillation    History of Present Illness:    [name] is a 85 y.o. female with a hx of  mitral valve repair, persistent atrial fibrillation, diastolic heart failure, and chronic anticoagulation therapy.Most recently noted to be in atrial flutter with rapid ventricular response.   Persistent atrial flutter with poor rate control has been a problem.  Energy level is decreased.  Dyspnea on exertion, unchanged.  No dyspnea at rest, no orthopnea, and no peripheral edema.  She denies chest pain.  She denies significant palpitations.  Past Medical History:  Diagnosis Date  . Anxiety   . Arthritis    Some in neck and back, but "can't complain"  . Atypical atrial flutter ([place])   . Breast cancer ([place])   . Cancer [facility])    1998 Right breast cancer with lumpectomy and radiation therapy  . Chronic diastolic CHF (congestive heart failure) ([place])   . Complication of anesthesia    Nausea from versed  . GERD (gastroesophageal reflux disease)   . Glaucoma   . Hiatal hernia   . History of mitral valve repair    Mitral valve prolapse, surgery in 2010  . Hyponatremia   . Normocytic anemia 03/21/2014  . Persistent atrial fibrillation    s/p MAZE 2010  . Personal history of radiation therapy   . Rheumatic fever    age 67  . Thrombocytopenia ([place])     Past Surgical History:  Procedure Laterality Date  . ABDOMINAL HYSTERECTOMY    . APPENDECTOMY    . BACK SURGERY     Synovial cyst on spine, removed laproscopically in 2000?  [name] BREAST BIOPSY    . BREAST LUMPECTOMY     right 1998  . BREAST SURGERY     1998 Lumpectomy in right breast  . CARDIAC CATHETERIZATION    . CARDIOVERSION  04/29/2012   Procedure:  CARDIOVERSION;  Surgeon: [name], MD;  Location: [facility] ENDOSCOPY;  Service: Cardiovascular;  Laterality: N/A;  . CARDIOVERSION  06/19/2012   Procedure: CARDIOVERSION;  Surgeon: Sinclair Grooms, MD;  Location: [facility] ENDOSCOPY;  Service: Cardiovascular;  Laterality: N/A;  h/p from 11/8 in file drawer/dl  . CATARACT EXTRACTION    . CESAREAN SECTION     Three C-Sections  . EYE SURGERY     Cataract Surgery, both eyes  . KIDNEY CYST REMOVAL    . MITRAL VALVE REPAIR  June 2010  . TEE WITHOUT CARDIOVERSION  04/29/2012   Procedure: TRANSESOPHAGEAL ECHOCARDIOGRAM (TEE);  Surgeon: [name], MD;  Location: [facility] ENDOSCOPY;  Service: Cardiovascular;  Laterality: N/A;  . TONSILLECTOMY      Current Medications: Current Meds  Medication Sig  . acetaminophen (TYLENOL) 500 MG tablet Take 500 mg by mouth every 6 (six) hours as needed for mild pain.  [name] apixaban (ELIQUIS) 5 MG TABS tablet Take 1 tablet (5 mg total) by mouth 2 (two) times daily.  . Ascorbic Acid (VITAMIN C) 1000 MG tablet Take 1,000 mg by mouth daily.  . brimonidine (ALPHAGAN) 0.2 % ophthalmic solution Place 1 drop into both eyes 2 (two) times daily.  [name] CHIA SEED PO Take 5 mLs by mouth  daily.   . cholecalciferol (VITAMIN D) 1000 UNITS tablet Take 1,000 Units by mouth daily.  . Coenzyme Q10 150 MG CAPS Take by mouth 2 times daily at 12 noon and 4 pm.  . Cyanocobalamin (VITAMIN B-12) 1000 MCG SUBL Place 1,000 mcg under the tongue daily.   . Flaxseed, Linseed, (FLAX SEEDS PO) Take 1 capsule by mouth daily.  . LevOCARNitine (L-CARNITINE) 500 MG TABS Take 500 mg by mouth daily.  [name] LORazepam (ATIVAN) 0.5 MG tablet Take 0.25 mg by mouth as needed.  . magnesium 30 MG tablet Take 30 mg by mouth daily.  . metoprolol succinate (TOPROL-XL) 50 MG 24 hr tablet Take 50 mg by mouth 2 (two) times daily. Take with or immediately following a meal.  . Omega-3 Fatty Acids (FISH OIL) 1000 MG CAPS Take 2,000 mg by mouth daily.  [name] OVER THE COUNTER  MEDICATION Take 1 each by mouth daily. Hemp heart seeds  . OVER THE COUNTER MEDICATION Take 1,250 mg by mouth daily. "Omega-Q squid supplement  . PAPAYA PO Take 1 capsule by mouth daily.  . polyethylene glycol (MIRALAX / GLYCOLAX) packet Take 17 g by mouth daily as needed for mild constipation.  . Probiotic Product (PROBIOTIC DAILY PO) Take 1 capsule by mouth daily. 10 Billi CFU   . Taurine 500 MG CAPS Take 500 mg by mouth daily.     Allergies:   Amiodarone; Amoxicillin; Amoxicillin-pot clavulanate; Aspirin; Ciprofloxacin; Codeine; Diuretic [buchu-cornsilk-ch grass-hydran]; Iohexol; Lisinopril; Midazolam hcl; Quinolones; Sotalol; Sulfa antibiotics; Thioxanthenes; Valium; Vancomycin; and Versed [midazolam]   Social History   Socioeconomic History  . Marital status: Widowed    Spouse name: Not on file  . Number of children: Not on file  . Years of education: Not on file  . Highest education level: Not on file  Occupational History  . Not on file  Social Needs  . Financial resource strain: Not on file  . Food insecurity:    Worry: Not on file    Inability: Not on file  . Transportation needs:    Medical: Not on file    Non-medical: Not on file  Tobacco Use  . Smoking status: Never Smoker  . Smokeless tobacco: Never Used  Substance and Sexual Activity  . Alcohol use: No  . Drug use: No  . Sexual activity: Never    Comment: Smoked one cigarette a day as a teen  Lifestyle  . Physical activity:    Days per week: Not on file    Minutes per session: Not on file  . Stress: Not on file  Relationships  . Social connections:    Talks on phone: Not on file    Gets together: Not on file    Attends religious service: Not on file    Active member of club or organization: Not on file    Attends meetings of clubs or organizations: Not on file    Relationship status: Not on file  Other Topics Concern  . Not on file  Social History Narrative   Pt lives in [place] alone.  Homemaker.   Widowed     Family History: The patient's family history includes Brain cancer in her sister; Cancer in her maternal grandfather; Heart attack in her father; Heart disease in her mother; Other in her sister.  ROS:   Please see the history of present illness.    Cough, easy bruising, irregular heartbeat.  All other systems reviewed and are negative.  EKGs/Labs/Other Studies Reviewed:  The following studies were reviewed today: No new cardiac imaging or functional data.  EKG:  EKG performed on July 22, 2018 demonstrates atrial tachycardia/atypical flutter at 220 bpm with variable AV conduction.  No acute ST-T wave changes are noted.  Recent Labs: 01/28/2018: BUN 15; Creatinine, Ser 0.65; Hemoglobin 14.2; Platelets 174; Potassium 4.7; Sodium 138; TSH 3.040  Recent Lipid Panel No results found for: CHOL, TRIG, HDL, CHOLHDL, VLDL, LDLCALC, LDLDIRECT  Physical Exam:    VS:  BP 124/88   Pulse (!) 108   Ht 5\' 2"  (1.575 m)   Wt 145 lb (65.8 kg)   SpO2 97%   BMI 26.52 kg/m     Wt Readings from Last 3 Encounters:  09/03/18 145 lb (65.8 kg)  07/22/18 140 lb (63.5 kg)  04/15/18 141 lb 6.4 oz (64.1 kg)     GEN: Appears younger than stated age. No acute distress HEENT: Normal NECK: No JVD. LYMPHATICS: No lymphadenopathy CARDIAC: IrregularRR.  No murmur murmur, no gallop gallop, no edema VASCULAR: 2+ radial and carotid pulses, no bruits RESPIRATORY:  Clear to auscultation without rales, wheezing or rhonchi  ABDOMEN: Soft, non-tender, non-distended, No pulsatile mass, MUSCULOSKELETAL: No deformity  SKIN: Warm and dry NEUROLOGIC:  Alert and oriented x 3 PSYCHIATRIC:  Normal affect   ASSESSMENT:    1. Persistent atrial fibrillation   2. Anticoagulation goal of INR 2 to 3   3. S/P mitral valve repair   4. SOB (shortness of breath) on exertion    PLAN:    In order of problems listed above:  1. Persistent atrial flutter with borderline rate control.  Plan discontinued  therapy with beta-blocker and clinical follow-up in EP.  AV node ablation with pacemaker has been discussed but not accepted by the patient.  Her plan now is to manage this conservatively if at all possible. 2. Continue anticoagulation therapy and notify if bleeding. 3. No clinical evidence of mitral valve regurgitation. 4. Dyspnea secondary to acute on chronic diastolic heart failure from atrial tachycardia.  She should notify us of lower extremity swelling, orthopnea, or progressive dyspnea.  Continue moderate physical activity.  Notify us of change in exertional tolerance.  Clinical follow-up in 1 year.   Medication Adjustments/Labs and Tests Ordered: Current medicines are reviewed at length with the patient today.  Concerns regarding medicines are outlined above.  No orders of the defined types were placed in this encounter.  No orders of the defined types were placed in this encounter.   Patient Instructions  Medication Instructions:  Your physician recommends that you continue on your current medications as directed. Please refer to the Current Medication list given to you today.  If you need a refill on your cardiac medications before your next appointment, please call your pharmacy.   Lab work: None ordered If you have labs (blood work) drawn today and your tests are completely normal, you will receive your results only by: [name] MyChart Message (if you have MyChart) OR . A paper copy in the mail If you have any lab test that is abnormal or we need to change your treatment, we will call you to review the results.  Testing/Procedures: None ordered  Follow-Up: At [facility], you and your health needs are our priority.  As part of our continuing mission to provide you with exceptional heart care, we have created designated Provider Care Teams.  These Care Teams include your primary Cardiologist (physician) and Advanced Practice Providers (APPs -  Physician Assistants and Nurse  Practitioners) who all work together to provide you with the care you need, when you need it. . You will need a follow up appointment in 1 year.  Please call our office 2 months in advance to schedule this appointment.  You may see Sinclair Grooms, MD or one of the following Advanced Practice Providers on your designated Care Team:   . Truitt Merle, NP . Cecilie Kicks, NP . Kathyrn Drown, NP   Any Other Special Instructions Will Be Listed Below (If Applicable).     Signed, Sinclair Grooms, MD  09/03/2018 6:01 PM    [facility]

## 2018-09-03 NOTE — Patient Instructions (Addendum)
Medication Instructions:  Your physician recommends that you continue on your current medications as directed. Please refer to the Current Medication list given to you today.  If you need a refill on your cardiac medications before your next appointment, please call your pharmacy.   Lab work: None ordered If you have labs (blood work) drawn today and your tests are completely normal, you will receive your results only by: [name] MyChart Message (if you have MyChart) OR . A paper copy in the mail If you have any lab test that is abnormal or we need to change your treatment, we will call you to review the results.  Testing/Procedures: None ordered  Follow-Up: At [facility], you and your health needs are our priority.  As part of our continuing mission to provide you with exceptional heart care, we have created designated Provider Care Teams.  These Care Teams include your primary Cardiologist (physician) and Advanced Practice Providers (APPs -  Physician Assistants and Nurse Practitioners) who all work together to provide you with the care you need, when you need it. . You will need a follow up appointment in 1 year.  Please call our office 2 months in advance to schedule this appointment.  You may see Sinclair Grooms, MD or one of the following Advanced Practice Providers on your designated Care Team:   . Truitt Merle, NP . Cecilie Kicks, NP . Kathyrn Drown, NP   Any Other Special Instructions Will Be Listed Below (If Applicable).

## 2018-09-18 DIAGNOSIS — E782 Mixed hyperlipidemia: Secondary | ICD-10-CM | POA: Diagnosis not present

## 2018-09-18 DIAGNOSIS — R0789 Other chest pain: Secondary | ICD-10-CM | POA: Diagnosis not present

## 2018-09-18 DIAGNOSIS — C50919 Malignant neoplasm of unspecified site of unspecified female breast: Secondary | ICD-10-CM | POA: Diagnosis not present

## 2018-09-18 DIAGNOSIS — I10 Essential (primary) hypertension: Secondary | ICD-10-CM | POA: Diagnosis not present

## 2018-09-18 DIAGNOSIS — I4891 Unspecified atrial fibrillation: Secondary | ICD-10-CM | POA: Diagnosis not present

## 2018-09-18 DIAGNOSIS — I509 Heart failure, unspecified: Secondary | ICD-10-CM | POA: Diagnosis not present

## 2018-09-18 DIAGNOSIS — D500 Iron deficiency anemia secondary to blood loss (chronic): Secondary | ICD-10-CM | POA: Diagnosis not present

## 2018-09-18 DIAGNOSIS — H409 Unspecified glaucoma: Secondary | ICD-10-CM | POA: Diagnosis not present

## 2018-11-25 DIAGNOSIS — H401113 Primary open-angle glaucoma, right eye, severe stage: Secondary | ICD-10-CM | POA: Diagnosis not present

## 2018-11-25 DIAGNOSIS — H401122 Primary open-angle glaucoma, left eye, moderate stage: Secondary | ICD-10-CM | POA: Diagnosis not present

## 2018-11-25 DIAGNOSIS — Z961 Presence of intraocular lens: Secondary | ICD-10-CM | POA: Diagnosis not present

## 2018-11-25 DIAGNOSIS — H52203 Unspecified astigmatism, bilateral: Secondary | ICD-10-CM | POA: Diagnosis not present

## 2018-12-24 ENCOUNTER — Other Ambulatory Visit: Payer: Self-pay | Admitting: Interventional Cardiology

## 2018-12-24 NOTE — Telephone Encounter (Signed)
Eliquis 5mg  refill request received; pt is 85 yrs old, wt-65.8kg, Crea-0.62 on 03/19/2018 via [facility] at [place], last seen by Dr. Tamala Julian on 09/03/2018; will and in refill to requested pharmacy.

## 2019-02-06 ENCOUNTER — Telehealth: Payer: Self-pay | Admitting: Interventional Cardiology

## 2019-02-06 NOTE — Telephone Encounter (Signed)
New message   STAT if HR is under 50 or over 120 (normal HR is 60-100 beats per minute)  1) What is your heart rate? 66 hr   2) Do you have a log of your heart rate readings (document readings)? No   3) Do you have any other symptoms? Patient state that she took CBD and it slowed her hr down. Please call to discuss.

## 2019-02-06 NOTE — Telephone Encounter (Signed)
Her HR has dropped because the CBD oil interacts with her metoprolol and increases concentrations of the metoprolol. CBD oil on its own does not decrease HR. She also needs to limit use of her lorazepam - CBD will increase concentrations of this as well which can increase the potential for CNS depression (drowsiness, sedation, dizziness, or hypotension)

## 2019-02-06 NOTE — Telephone Encounter (Signed)
Spoke with pt and made her aware of information provided and need for EKG.  Pt states she does not use the Lorazepam hardly and she will just plan not to use at all.  Advised her that I will reach back out about EKG as nurse schedules are blocked and I will have to see when I can get her in.  Pt appreciative for call.

## 2019-02-06 NOTE — Telephone Encounter (Signed)
Spoke with pt and scheduled her to see Richardson Dopp, PA-C on Tuesday.  Pt appreciative for call.

## 2019-02-06 NOTE — Telephone Encounter (Signed)
Pt started taking CBD capsules once daily recently.  HR recently has been in the 60s, normally runs 80s or higher.  Pt states she actually feels much better than she did before.  Denies any changes in BP since starting new CBD capsule.  Pt said she decided to call because her son told her she needed to.  Pt just wanted to get Dr. Thompson Caul opinion on using this capsule.  Advised I would send to him and our Pharmacy team to see if they have any info in regards to this CBD capsule.  She uses Canna Complete from [employer].

## 2019-02-06 NOTE — Telephone Encounter (Signed)
WOW, thanks! Okay to stay on CBD at current dose Please get ECG.

## 2019-02-10 ENCOUNTER — Encounter (INDEPENDENT_AMBULATORY_CARE_PROVIDER_SITE_OTHER): Payer: Self-pay

## 2019-02-10 ENCOUNTER — Ambulatory Visit (INDEPENDENT_AMBULATORY_CARE_PROVIDER_SITE_OTHER): Payer: Medicare Other | Admitting: Physician Assistant

## 2019-02-10 ENCOUNTER — Other Ambulatory Visit: Payer: Self-pay

## 2019-02-10 ENCOUNTER — Encounter: Payer: Self-pay | Admitting: Physician Assistant

## 2019-02-10 VITALS — BP 110/62 | HR 81 | Ht 62.0 in | Wt 142.0 lb

## 2019-02-10 DIAGNOSIS — I4819 Other persistent atrial fibrillation: Secondary | ICD-10-CM | POA: Diagnosis not present

## 2019-02-10 DIAGNOSIS — Z9889 Other specified postprocedural states: Secondary | ICD-10-CM | POA: Diagnosis not present

## 2019-02-10 DIAGNOSIS — I484 Atypical atrial flutter: Secondary | ICD-10-CM | POA: Diagnosis not present

## 2019-02-10 NOTE — Progress Notes (Signed)
Cardiology Office Note:    Date:  02/10/2019   ID:  [name], DOB May 30, 1926, MRN [id number]  PCP:  Tara Huddle, MD  Cardiologist:  Tara Grooms, MD  Electrophysiologist:  Tara Haw, MD   Referring MD: Tara Huddle, MD   Chief Complaint  Patient presents with  . Follow-up    Atrial fibrillation/flutter    History of Present Illness:    [name] is a 85 y.o. female with:  Mitral valve disease status post mitral valve repair 2010  Persistent atrial fibrillation  Status post Maze procedure 2010  Atrial flutter  >> rate control  Chronic diastolic heart failure  Chronic anticoagulation  History of breast cancer  [name] was last seen by [name] in February 2020.  She had persistent atrial flutter with borderline rate control.  Plan was to follow-up with EP with consideration of AV nodal ablation and pacemaker.  She was last seen by [name] in January 2020.  Her beta-blocker was adjusted at that time.  The patient has preferred conservative management if possible.  She called in recently to note that she had started using CBD oil.  Her heart rate was noted to be lower.  This was reviewed by pharmacy and it is noted that CBD oil interacts with metoprolol and increases concentrations of metoprolol.  CBD can also increase concentrations of lorazepam which can increase potential for CNS depression.  She returns for follow-up.  She is here alone.  She has really felt well since she started using CBD.  She has not had syncope, near syncope, dizziness, fatigue.  She has not had shortness of breath, chest pain, orthopnea, significant leg swelling.  She has not had any bleeding issues.  Prior CV studies:   The following studies were reviewed today:  Holter monitor in 09/05/2017 Atrial flutter, average heart rate 74  Echo 09/10/2012 Status post mitral valve repair, mild MR, mild LAE, mild concentric LVH, EF 50-55, mild TR, trace AI/PI  Cardiac  catheterization 11/11/2008 CONCLUSION: 1. Moderately severe to severe mitral regurgitation. 2. Mild pulmonary hypertension. 3. Widely patent coronaries. 4. Normal left ventricular function with ejection fraction greater than 60%.  Past Medical History:  Diagnosis Date  . Anxiety   . Arthritis    Some in neck and back, but "can't complain"  . Atypical atrial flutter ([place])   . Breast cancer ([place])   . Cancer [facility])    1998 Right breast cancer with lumpectomy and radiation therapy  . Chronic diastolic CHF (congestive heart failure) ([place])   . Complication of anesthesia    Nausea from versed  . GERD (gastroesophageal reflux disease)   . Glaucoma   . Hiatal hernia   . History of mitral valve repair    Mitral valve prolapse, surgery in 2010  . Hyponatremia   . Normocytic anemia 03/21/2014  . Persistent atrial fibrillation    s/p MAZE 2010  . Personal history of radiation therapy   . Rheumatic fever    age 45  . Thrombocytopenia ([place])    Surgical Hx: The patient  has a past surgical history that includes Mitral valve repair (June 2010); Cataract extraction; Kidney cyst removal; Breast surgery; Back surgery; Eye surgery; Appendectomy; Tonsillectomy; Cesarean section; TEE without cardioversion (04/29/2012); Cardioversion (04/29/2012); Cardioversion (06/19/2012); Breast lumpectomy; Breast biopsy; Abdominal hysterectomy; and Cardiac catheterization.   Current Medications: Current Meds  Medication Sig  . acetaminophen (TYLENOL) 500 MG tablet Take 500 mg by mouth every 6 (six)  hours as needed for mild pain.  . Ascorbic Acid (VITAMIN C) 1000 MG tablet Take 1,000 mg by mouth daily.  . brimonidine (ALPHAGAN) 0.2 % ophthalmic solution Place 1 drop into both eyes 2 (two) times daily.  [name] CHIA SEED PO Take 5 mLs by mouth daily.   . cholecalciferol (VITAMIN D) 1000 UNITS tablet Take 1,000 Units by mouth daily.  . Coenzyme Q10 150 MG CAPS Take by mouth 2 times daily at 12 noon and 4 pm.  .  Cyanocobalamin (VITAMIN B-12) 1000 MCG SUBL Place 1,000 mcg under the tongue daily.   [name] ELIQUIS 5 MG TABS tablet TAKE 1 TABLET BY MOUTH TWICE DAILY.  [name] Flaxseed, Linseed, (FLAX SEEDS PO) Take 1 capsule by mouth daily.  . LevOCARNitine (L-CARNITINE) 500 MG TABS Take 500 mg by mouth daily.  . magnesium 30 MG tablet Take 30 mg by mouth daily.  . metoprolol succinate (TOPROL-XL) 50 MG 24 hr tablet Take 50 mg by mouth 2 (two) times daily. Take with or immediately following a meal.  . Omega-3 Fatty Acids (FISH OIL) 1000 MG CAPS Take 2,000 mg by mouth daily.  [name] OVER THE COUNTER MEDICATION Take 1 each by mouth daily. Hemp heart seeds  . OVER THE COUNTER MEDICATION Take 1,250 mg by mouth daily. "Omega-Q squid supplement  . OVER THE COUNTER MEDICATION 1 tumeric capsle by mouth twice daily  . OVER THE COUNTER MEDICATION 1 Canna Complete supplement by mouth daily  . PAPAYA PO Take 1 capsule by mouth daily.  . polyethylene glycol (MIRALAX / GLYCOLAX) packet Take 17 g by mouth daily as needed for mild constipation.  . Probiotic Product (PROBIOTIC DAILY PO) Take 1 capsule by mouth daily. 10 Billi CFU      Allergies:   Amiodarone, Amoxicillin, Amoxicillin-pot clavulanate, Aspirin, Ciprofloxacin, Codeine, Diuretic [buchu-cornsilk-ch grass-hydran], Iohexol, Lisinopril, Midazolam hcl, Quinolones, Sotalol, Sulfa antibiotics, Thioxanthenes, Valium, Vancomycin, and Versed [midazolam]   Social History   Tobacco Use  . Smoking status: Never Smoker  . Smokeless tobacco: Never Used  Substance Use Topics  . Alcohol use: No  . Drug use: No     Family Hx: The patient's family history includes Brain cancer in her sister; Cancer in her maternal grandfather; Heart attack in her father; Heart disease in her mother; Other in her sister.  ROS:   Please see the history of present illness.    ROS All other systems reviewed and are negative.   EKGs/Labs/Other Test Reviewed:    EKG:  EKG is  ordered today.  The ekg  ordered today demonstrates atrial flutter with variable AV block, heart rate 71, QTc 415, right axis deviation, similar to prior tracings  Recent Labs: No results found for requested labs within last 8760 hours.   Recent Lipid Panel No results found for: CHOL, TRIG, HDL, CHOLHDL, LDLCALC, LDLDIRECT  Physical Exam:    VS:  BP 110/62   Pulse 81   Ht 5\' 2"  (1.575 m)   Wt 142 lb (64.4 kg)   BMI 25.97 kg/m     Wt Readings from Last 3 Encounters:  02/10/19 142 lb (64.4 kg)  09/03/18 145 lb (65.8 kg)  07/22/18 140 lb (63.5 kg)     Physical Exam  Constitutional: She is oriented to person, place, and time. She appears well-developed and well-nourished. No distress.  HENT:  Head: Normocephalic and atraumatic.  Neck: Neck supple.  Cardiovascular: Normal rate, S1 normal, S2 normal and normal heart sounds. An irregularly irregular rhythm present.  No murmur heard. Pulmonary/Chest: Effort normal. She has no rales.  Abdominal: Soft. There is no hepatomegaly.  Musculoskeletal:        General: No edema.  Neurological: She is alert and oriented to person, place, and time.  Skin: Skin is warm and dry.    ASSESSMENT & PLAN:    1. Persistent atrial fibrillation 2. Atypical atrial flutter (HCC) Heart rate is well controlled.  We discussed the potential interaction between CBD and beta-blockers.  As her heart rate is well controlled, I will not change her dose of metoprolol succinate at this point.  She knows to contact us if she has any symptoms of dizziness, near syncope or syncope.  Continue Apixaban.  Obtain follow-up BMET, CBC today.  3. S/P mitral valve repair Stable by echocardiogram in March 2014.  I cannot auscultate a murmur on exam today.  Continue SBE prophylaxis.   Dispo:  Return in 10 weeks (on 04/21/2019) for Scheduled Follow Up with [name].   Medication Adjustments/Labs and Tests Ordered: Current medicines are reviewed at length with the patient today.  Concerns  regarding medicines are outlined above.  Tests Ordered: Orders Placed This Encounter  Procedures  . CBC w/Diff  . Basic Metabolic Panel (BMET)  . EKG 12-Lead   Medication Changes: No orders of the defined types were placed in this encounter.   Signed, Richardson Dopp, PA-C  02/10/2019 5:01 PM    [facility] [place], [place],   [address] Phone: [phone]; Fax: [phone]

## 2019-02-10 NOTE — Patient Instructions (Signed)
Medication Instructions:  Your physician recommends that you continue on your current medications as directed. Please refer to the Current Medication list given to you today.   Labwork: Your physician recommends that you have lab work today: bmet/cbc   Testing/Procedures: -None  Follow-Up: Your physician recommends that you keep your scheduled  follow-up appointment with Dr.Smith on October 13j @ 2:00 pm.   Any Other Special Instructions Will Be Listed Below (If Applicable).     If you need a refill on your cardiac medications before your next appointment, please call your pharmacy.

## 2019-02-11 LAB — BASIC METABOLIC PANEL
BUN/Creatinine Ratio: 17 (ref 12–28)
BUN: 11 mg/dL (ref 10–36)
CO2: 26 mmol/L (ref 20–29)
Calcium: 9.6 mg/dL (ref 8.7–10.3)
Chloride: 95 mmol/L — ABNORMAL LOW (ref 96–106)
Creatinine, Ser: 0.65 mg/dL (ref 0.57–1.00)
GFR calc Af Amer: 90 mL/min/{1.73_m2} (ref >59)
GFR calc non Af Amer: 78 mL/min/{1.73_m2} (ref >59)
Glucose: 99 mg/dL (ref 65–99)
Potassium: 5.1 mmol/L (ref 3.5–5.2)
Sodium: 133 mmol/L — ABNORMAL LOW (ref 134–144)

## 2019-02-11 LAB — CBC WITH DIFFERENTIAL/PLATELET
Basophils Absolute: 0 10*3/uL (ref 0.0–0.2)
Basos: 1 %
EOS (ABSOLUTE): 0.1 10*3/uL (ref 0.0–0.4)
Eos: 1 %
Hematocrit: 38.2 % (ref 34.0–46.6)
Hemoglobin: 13.1 g/dL (ref 11.1–15.9)
Immature Grans (Abs): 0 10*3/uL (ref 0.0–0.1)
Immature Granulocytes: 0 %
Lymphocytes Absolute: 2.2 10*3/uL (ref 0.7–3.1)
Lymphs: 39 %
MCH: 34.2 pg — ABNORMAL HIGH (ref 26.6–33.0)
MCHC: 34.3 g/dL (ref 31.5–35.7)
MCV: 100 fL — ABNORMAL HIGH (ref 79–97)
Monocytes Absolute: 0.5 10*3/uL (ref 0.1–0.9)
Monocytes: 10 %
Neutrophils Absolute: 2.8 10*3/uL (ref 1.4–7.0)
Neutrophils: 49 %
Platelets: 150 10*3/uL (ref 150–450)
RBC: 3.83 x10E6/uL (ref 3.77–5.28)
RDW: 11.9 % (ref 11.7–15.4)
WBC: 5.7 10*3/uL (ref 3.4–10.8)

## 2019-02-13 ENCOUNTER — Telehealth: Payer: Self-pay | Admitting: *Deleted

## 2019-02-13 NOTE — Telephone Encounter (Signed)
Returned pts call and she has been made aware of her lab results and any, if all, questions have been answered.

## 2019-02-13 NOTE — Progress Notes (Signed)
Pt has been made aware of normal result and verbalized understanding.  jw 02/13/2019

## 2019-02-13 NOTE — Telephone Encounter (Signed)
Call placed to pt re: lab results, left a message for her to call back.  

## 2019-02-13 NOTE — Telephone Encounter (Signed)
Follow Up  Patient returning call for lab results. Please give patient a call back.

## 2019-02-13 NOTE — Telephone Encounter (Signed)
-----   Message from Liliane Shi, [place] sent at 02/11/2019  6:01 PM EDT ----- Hemoglobin normal, creatinine normal, potassium normal. PLAN:  -Continue current medications and follow up as planned.  Richardson Dopp, PA-C    02/11/2019 6:00 PM

## 2019-02-23 DIAGNOSIS — M65312 Trigger thumb, left thumb: Secondary | ICD-10-CM | POA: Diagnosis not present

## 2019-02-23 DIAGNOSIS — R52 Pain, unspecified: Secondary | ICD-10-CM | POA: Diagnosis not present

## 2019-03-25 DIAGNOSIS — M65312 Trigger thumb, left thumb: Secondary | ICD-10-CM | POA: Diagnosis not present

## 2019-03-30 ENCOUNTER — Ambulatory Visit: Payer: Medicare Other | Admitting: Cardiology

## 2019-03-31 DIAGNOSIS — Z23 Encounter for immunization: Secondary | ICD-10-CM | POA: Diagnosis not present

## 2019-03-31 DIAGNOSIS — R0789 Other chest pain: Secondary | ICD-10-CM | POA: Diagnosis not present

## 2019-03-31 DIAGNOSIS — H6121 Impacted cerumen, right ear: Secondary | ICD-10-CM | POA: Diagnosis not present

## 2019-03-31 DIAGNOSIS — D500 Iron deficiency anemia secondary to blood loss (chronic): Secondary | ICD-10-CM | POA: Diagnosis not present

## 2019-03-31 DIAGNOSIS — H409 Unspecified glaucoma: Secondary | ICD-10-CM | POA: Diagnosis not present

## 2019-03-31 DIAGNOSIS — Z0000 Encounter for general adult medical examination without abnormal findings: Secondary | ICD-10-CM | POA: Diagnosis not present

## 2019-03-31 DIAGNOSIS — N644 Mastodynia: Secondary | ICD-10-CM | POA: Diagnosis not present

## 2019-03-31 DIAGNOSIS — I10 Essential (primary) hypertension: Secondary | ICD-10-CM | POA: Diagnosis not present

## 2019-03-31 DIAGNOSIS — I4891 Unspecified atrial fibrillation: Secondary | ICD-10-CM | POA: Diagnosis not present

## 2019-03-31 DIAGNOSIS — Z79899 Other long term (current) drug therapy: Secondary | ICD-10-CM | POA: Diagnosis not present

## 2019-03-31 DIAGNOSIS — E782 Mixed hyperlipidemia: Secondary | ICD-10-CM | POA: Diagnosis not present

## 2019-03-31 DIAGNOSIS — Z1389 Encounter for screening for other disorder: Secondary | ICD-10-CM | POA: Diagnosis not present

## 2019-03-31 DIAGNOSIS — D6869 Other thrombophilia: Secondary | ICD-10-CM | POA: Diagnosis not present

## 2019-03-31 DIAGNOSIS — I509 Heart failure, unspecified: Secondary | ICD-10-CM | POA: Diagnosis not present

## 2019-04-14 DIAGNOSIS — H6123 Impacted cerumen, bilateral: Secondary | ICD-10-CM | POA: Diagnosis not present

## 2019-04-20 NOTE — Progress Notes (Signed)
Cardiology Office Note:    Date:  04/21/2019   ID:  [name], DOB 1926/01/21, MRN [id number]  PCP:  Josetta Huddle, MD  Cardiologist:  Sinclair Grooms, MD   Referring MD: Josetta Huddle, MD   Chief Complaint  Patient presents with  . Atrial Fibrillation  . Cardiac Valve Problem    Mitral valve regurgitation/repair    History of Present Illness:    [name] is a 85 y.o. female with a hx of mitral valve repair, persistent atrial fibrillation, diastolic heart failure, and chronic anticoagulation therapy.Most recently noted to be in atrial flutter with rapid ventricular response.  Persistent atrial flutter with poor rate control has been a problem.  [name] is doing well.  She cannot believe she is 17 and doing so well.  She is not having syncope.  She is walking with a rolling walker that gives her stability.  She has not had syncope, orthopnea, PND, lower extremity swelling, or palpitations.  There have been no neurological complaints.  She has not noticed blood in her urine or stool.  Past Medical History:  Diagnosis Date  . Anxiety   . Arthritis    Some in neck and back, but "can't complain"  . Atypical atrial flutter ([place])   . Breast cancer ([place])   . Cancer [facility])    1998 Right breast cancer with lumpectomy and radiation therapy  . Chronic diastolic CHF (congestive heart failure) ([place])   . Complication of anesthesia    Nausea from versed  . GERD (gastroesophageal reflux disease)   . Glaucoma   . Hiatal hernia   . History of mitral valve repair    Mitral valve prolapse, surgery in 2010  . Hyponatremia   . Normocytic anemia 03/21/2014  . Persistent atrial fibrillation ([place])    s/p MAZE 2010  . Personal history of radiation therapy   . Rheumatic fever    age 56  . Thrombocytopenia ([place])     Past Surgical History:  Procedure Laterality Date  . ABDOMINAL HYSTERECTOMY    . APPENDECTOMY    . BACK SURGERY     Synovial cyst on spine, removed  laproscopically in 2000?  [name] BREAST BIOPSY    . BREAST LUMPECTOMY     right 1998  . BREAST SURGERY     1998 Lumpectomy in right breast  . CARDIAC CATHETERIZATION    . CARDIOVERSION  04/29/2012   Procedure: CARDIOVERSION;  Surgeon: [name], MD;  Location: [facility] ENDOSCOPY;  Service: Cardiovascular;  Laterality: N/A;  . CARDIOVERSION  06/19/2012   Procedure: CARDIOVERSION;  Surgeon: Sinclair Grooms, MD;  Location: [facility] ENDOSCOPY;  Service: Cardiovascular;  Laterality: N/A;  h/p from 11/8 in file drawer/dl  . CATARACT EXTRACTION    . CESAREAN SECTION     Three C-Sections  . EYE SURGERY     Cataract Surgery, both eyes  . KIDNEY CYST REMOVAL    . MITRAL VALVE REPAIR  June 2010  . TEE WITHOUT CARDIOVERSION  04/29/2012   Procedure: TRANSESOPHAGEAL ECHOCARDIOGRAM (TEE);  Surgeon: [name], MD;  Location: [facility] ENDOSCOPY;  Service: Cardiovascular;  Laterality: N/A;  . TONSILLECTOMY      Current Medications: Current Meds  Medication Sig  . acetaminophen (TYLENOL) 500 MG tablet Take 500 mg by mouth every 6 (six) hours as needed for mild pain.  . Ascorbic Acid (VITAMIN C) 1000 MG tablet Take 1,000 mg by mouth daily.  . brimonidine (ALPHAGAN) 0.2 % ophthalmic  solution Place 1 drop into both eyes 2 (two) times daily.  [name] CHIA SEED PO Take 5 mLs by mouth daily.   . cholecalciferol (VITAMIN D) 1000 UNITS tablet Take 1,000 Units by mouth daily.  . Coenzyme Q10 150 MG CAPS Take by mouth 2 times daily at 12 noon and 4 pm.  . Cyanocobalamin (VITAMIN B-12) 1000 MCG SUBL Place 1,000 mcg under the tongue daily.   [name] ELIQUIS 5 MG TABS tablet TAKE 1 TABLET BY MOUTH TWICE DAILY.  [name] Flaxseed, Linseed, (FLAX SEEDS PO) Take 1 capsule by mouth daily.  . LevOCARNitine (L-CARNITINE) 500 MG TABS Take 500 mg by mouth daily.  . magnesium 30 MG tablet Take 30 mg by mouth daily.  . metoprolol succinate (TOPROL-XL) 50 MG 24 hr tablet Take 50 mg by mouth 2 (two) times daily. Take with or immediately following a  meal.  . Omega-3 Fatty Acids (FISH OIL) 1000 MG CAPS Take 2,000 mg by mouth daily.  [name] OVER THE COUNTER MEDICATION Take 1 each by mouth daily. Hemp heart seeds  . OVER THE COUNTER MEDICATION Take 1,250 mg by mouth daily. "Omega-Q squid supplement  . OVER THE COUNTER MEDICATION 1 tumeric capsle by mouth twice daily  . OVER THE COUNTER MEDICATION 1 Canna Complete supplement by mouth daily  . PAPAYA PO Take 1 capsule by mouth daily.  . polyethylene glycol (MIRALAX / GLYCOLAX) packet Take 17 g by mouth daily as needed for mild constipation.  . Probiotic Product (PROBIOTIC DAILY PO) Take 1 capsule by mouth daily. 10 Billi CFU      Allergies:   Amiodarone, Amoxicillin, Amoxicillin-pot clavulanate, Aspirin, Ciprofloxacin, Codeine, Diuretic [buchu-cornsilk-ch grass-hydran], Iohexol, Lisinopril, Midazolam hcl, Quinolones, Sotalol, Sulfa antibiotics, Thioxanthenes, Valium, Vancomycin, and Versed [midazolam]   Social History   Socioeconomic History  . Marital status: Widowed    Spouse name: Not on file  . Number of children: Not on file  . Years of education: Not on file  . Highest education level: Not on file  Occupational History  . Not on file  Social Needs  . Financial resource strain: Not on file  . Food insecurity    Worry: Not on file    Inability: Not on file  . Transportation needs    Medical: Not on file    Non-medical: Not on file  Tobacco Use  . Smoking status: Never Smoker  . Smokeless tobacco: Never Used  Substance and Sexual Activity  . Alcohol use: No  . Drug use: No  . Sexual activity: Never    Comment: Smoked one cigarette a day as a teen  Lifestyle  . Physical activity    Days per week: Not on file    Minutes per session: Not on file  . Stress: Not on file  Relationships  . Social Herbalist on phone: Not on file    Gets together: Not on file    Attends religious service: Not on file    Active member of club or organization: Not on file    Attends  meetings of clubs or organizations: Not on file    Relationship status: Not on file  Other Topics Concern  . Not on file  Social History Narrative   Pt lives in [place] alone.  Homemaker.  Widowed     Family History: The patient's family history includes Brain cancer in her sister; Cancer in her maternal grandfather; Heart attack in her father; Heart disease in her mother; Other in her  sister.  ROS:   Please see the history of present illness.    Some days she feels wiped out.  Some nights she does not sleep well.  All other systems reviewed and are negative.  EKGs/Labs/Other Studies Reviewed:    The following studies were reviewed today: None  EKG:  EKG not repeated  Recent Labs: 02/10/2019: BUN 11; Creatinine, Ser 0.65; Hemoglobin 13.1; Platelets 150; Potassium 5.1; Sodium 133  Recent Lipid Panel No results found for: CHOL, TRIG, HDL, CHOLHDL, VLDL, LDLCALC, LDLDIRECT  Physical Exam:    VS:  BP 130/79   Pulse (!) 102   Ht 5\' 2"  (1.575 m)   Wt 144 lb 6.4 oz (65.5 kg)   SpO2 99%   BMI 26.41 kg/m     Wt Readings from Last 3 Encounters:  04/21/19 144 lb 6.4 oz (65.5 kg)  02/10/19 142 lb (64.4 kg)  09/03/18 145 lb (65.8 kg)     GEN: Appears younger than her stated age. No acute distress HEENT: Normal NECK: No JVD. LYMPHATICS: No lymphadenopathy CARDIAC: Irregularly irregular RR without murmur, gallop, or edema. VASCULAR:  Normal Pulses. No bruits. RESPIRATORY:  Clear to auscultation without rales, wheezing or rhonchi  ABDOMEN: Soft, non-tender, non-distended, No pulsatile mass, MUSCULOSKELETAL: No deformity  SKIN: Warm and dry NEUROLOGIC:  Alert and oriented x 3 PSYCHIATRIC:  Normal affect   ASSESSMENT:    1. Persistent atrial fibrillation ([place])   2. S/P mitral valve repair   3. Atypical atrial flutter ([place])   4. Anticoagulation goal of INR 2 to 3   5. Longstanding persistent atrial fibrillation ([place])   6. Educated about COVID-19 virus infection    PLAN:     In order of problems listed above:  1. Unchanged, and continues to have higher than desired heart rates.  Current control is with 50 mg of metoprolol twice daily.  She would like to discontinue metoprolol but is been dissuaded in that regard. 2. Auscultation demonstrates no regurgitation. 3. Not discussed 4. No bleeding 5. Social distancing, mask wearing, and handwashing is endorsed by the patient  Clinical follow-up in 8 to 12 months.  She is doing well at this time.  Will need an EKG on return.   Medication Adjustments/Labs and Tests Ordered: Current medicines are reviewed at length with the patient today.  Concerns regarding medicines are outlined above.  No orders of the defined types were placed in this encounter.  No orders of the defined types were placed in this encounter.   There are no Patient Instructions on file for this visit.   Signed, Sinclair Grooms, MD  04/21/2019 2:19 PM    [facility]

## 2019-04-21 ENCOUNTER — Ambulatory Visit (INDEPENDENT_AMBULATORY_CARE_PROVIDER_SITE_OTHER): Payer: Medicare Other | Admitting: Interventional Cardiology

## 2019-04-21 ENCOUNTER — Other Ambulatory Visit: Payer: Self-pay

## 2019-04-21 ENCOUNTER — Encounter: Payer: Self-pay | Admitting: Interventional Cardiology

## 2019-04-21 VITALS — BP 130/79 | HR 102 | Ht 62.0 in | Wt 144.4 lb

## 2019-04-21 DIAGNOSIS — Z5181 Encounter for therapeutic drug level monitoring: Secondary | ICD-10-CM

## 2019-04-21 DIAGNOSIS — I4819 Other persistent atrial fibrillation: Secondary | ICD-10-CM | POA: Diagnosis not present

## 2019-04-21 DIAGNOSIS — Z7189 Other specified counseling: Secondary | ICD-10-CM | POA: Diagnosis not present

## 2019-04-21 DIAGNOSIS — I484 Atypical atrial flutter: Secondary | ICD-10-CM | POA: Diagnosis not present

## 2019-04-21 DIAGNOSIS — Z9889 Other specified postprocedural states: Secondary | ICD-10-CM | POA: Diagnosis not present

## 2019-04-21 DIAGNOSIS — Z7901 Long term (current) use of anticoagulants: Secondary | ICD-10-CM | POA: Diagnosis not present

## 2019-04-21 NOTE — Patient Instructions (Signed)
Medication Instructions:  Your physician recommends that you continue on your current medications as directed. Please refer to the Current Medication list given to you today.  If you need a refill on your cardiac medications before your next appointment, please call your pharmacy.   Lab work: None If you have labs (blood work) drawn today and your tests are completely normal, you will receive your results only by: . MyChart Message (if you have MyChart) OR . A paper copy in the mail If you have any lab test that is abnormal or we need to change your treatment, we will call you to review the results.  Testing/Procedures: None  Follow-Up: At [facility], you and your health needs are our priority.  As part of our continuing mission to provide you with exceptional heart care, we have created designated Provider Care Teams.  These Care Teams include your primary Cardiologist (physician) and Advanced Practice Providers (APPs -  Physician Assistants and Nurse Practitioners) who all work together to provide you with the care you need, when you need it. You will need a follow up appointment in 9-12 months.  Please call our office 2 months in advance to schedule this appointment.  You may see Henry W Smith III, MD or one of the following Advanced Practice Providers on your designated Care Team:   Lori Gerhardt, NP Laura Ingold, NP . Jill McDaniel, NP  Any Other Special Instructions Will Be Listed Below (If Applicable).    

## 2019-04-24 ENCOUNTER — Other Ambulatory Visit: Payer: Self-pay | Admitting: Nurse Practitioner

## 2019-05-18 DIAGNOSIS — H401113 Primary open-angle glaucoma, right eye, severe stage: Secondary | ICD-10-CM | POA: Diagnosis not present

## 2019-05-18 DIAGNOSIS — Z961 Presence of intraocular lens: Secondary | ICD-10-CM | POA: Diagnosis not present

## 2019-06-10 DIAGNOSIS — H401132 Primary open-angle glaucoma, bilateral, moderate stage: Secondary | ICD-10-CM | POA: Diagnosis not present

## 2019-06-15 DIAGNOSIS — R0789 Other chest pain: Secondary | ICD-10-CM | POA: Diagnosis not present

## 2019-06-15 DIAGNOSIS — N644 Mastodynia: Secondary | ICD-10-CM | POA: Diagnosis not present

## 2019-06-21 ENCOUNTER — Other Ambulatory Visit: Payer: Self-pay | Admitting: Interventional Cardiology

## 2019-06-22 NOTE — Telephone Encounter (Signed)
Eliquis 5mg  refill request received, pt is 85yrs old, weight-65.5kg, Crea-0.65 on 02/10/2019, Diagnosis-Afib, and last seen by Dr. Daneen Schick on 04/21/2019. Dose is appropriate based on dosing criteria. Will send in refill to requested pharmacy.

## 2019-07-22 ENCOUNTER — Other Ambulatory Visit: Payer: Self-pay | Admitting: Nurse Practitioner

## 2019-07-23 DIAGNOSIS — Z23 Encounter for immunization: Secondary | ICD-10-CM | POA: Diagnosis not present

## 2019-07-24 ENCOUNTER — Ambulatory Visit (INDEPENDENT_AMBULATORY_CARE_PROVIDER_SITE_OTHER): Payer: Medicare Other | Admitting: Otolaryngology

## 2019-07-29 DIAGNOSIS — H401111 Primary open-angle glaucoma, right eye, mild stage: Secondary | ICD-10-CM | POA: Diagnosis not present

## 2019-07-30 ENCOUNTER — Ambulatory Visit (INDEPENDENT_AMBULATORY_CARE_PROVIDER_SITE_OTHER): Payer: Medicare Other | Admitting: Otolaryngology

## 2019-07-31 ENCOUNTER — Ambulatory Visit (INDEPENDENT_AMBULATORY_CARE_PROVIDER_SITE_OTHER): Payer: Medicare Other | Admitting: Otolaryngology

## 2019-07-31 ENCOUNTER — Encounter (INDEPENDENT_AMBULATORY_CARE_PROVIDER_SITE_OTHER): Payer: Self-pay | Admitting: Otolaryngology

## 2019-07-31 ENCOUNTER — Other Ambulatory Visit: Payer: Self-pay

## 2019-07-31 VITALS — Temp 97.2°F

## 2019-07-31 DIAGNOSIS — H6123 Impacted cerumen, bilateral: Secondary | ICD-10-CM | POA: Diagnosis not present

## 2019-07-31 NOTE — Progress Notes (Signed)
HPI: [name] is a 86 y.o. female who presents for evaluation of decreased hearing secondary to earwax.  She tried Debrox without success.  Left ear is worse than the right..  Past Medical History:  Diagnosis Date  . Anxiety   . Arthritis    Some in neck and back, but "can't complain"  . Atypical atrial flutter ([place])   . Breast cancer ([place])   . Cancer [facility])    1998 Right breast cancer with lumpectomy and radiation therapy  . Chronic diastolic CHF (congestive heart failure) ([place])   . Complication of anesthesia    Nausea from versed  . GERD (gastroesophageal reflux disease)   . Glaucoma   . Hiatal hernia   . History of mitral valve repair    Mitral valve prolapse, surgery in 2010  . Hyponatremia   . Normocytic anemia 03/21/2014  . Persistent atrial fibrillation ([place])    s/p MAZE 2010  . Personal history of radiation therapy   . Rheumatic fever    age 49  . Thrombocytopenia ([place])    Past Surgical History:  Procedure Laterality Date  . ABDOMINAL HYSTERECTOMY    . APPENDECTOMY    . BACK SURGERY     Synovial cyst on spine, removed laproscopically in 2000?  [name] BREAST BIOPSY    . BREAST LUMPECTOMY     right 1998  . BREAST SURGERY     1998 Lumpectomy in right breast  . CARDIAC CATHETERIZATION    . CARDIOVERSION  04/29/2012   Procedure: CARDIOVERSION;  Surgeon: [name], MD;  Location: [facility] ENDOSCOPY;  Service: Cardiovascular;  Laterality: N/A;  . CARDIOVERSION  06/19/2012   Procedure: CARDIOVERSION;  Surgeon: Sinclair Grooms, MD;  Location: [facility] ENDOSCOPY;  Service: Cardiovascular;  Laterality: N/A;  h/p from 11/8 in file drawer/dl  . CATARACT EXTRACTION    . CESAREAN SECTION     Three C-Sections  . EYE SURGERY     Cataract Surgery, both eyes  . KIDNEY CYST REMOVAL    . MITRAL VALVE REPAIR  June 2010  . TEE WITHOUT CARDIOVERSION  04/29/2012   Procedure: TRANSESOPHAGEAL ECHOCARDIOGRAM (TEE);  Surgeon: [name], MD;  Location: [facility] ENDOSCOPY;  Service:  Cardiovascular;  Laterality: N/A;  . TONSILLECTOMY     Social History   Socioeconomic History  . Marital status: Widowed    Spouse name: Not on file  . Number of children: Not on file  . Years of education: Not on file  . Highest education level: Not on file  Occupational History  . Not on file  Tobacco Use  . Smoking status: Never Smoker  . Smokeless tobacco: Never Used  Substance and Sexual Activity  . Alcohol use: No  . Drug use: No  . Sexual activity: Never    Comment: Smoked one cigarette a day as a teen  Other Topics Concern  . Not on file  Social History Narrative   Pt lives in [place] alone.  Homemaker.  Widowed   Social Determinants of Radio broadcast assistant Strain:   . Difficulty of Paying Living Expenses: Not on file  Food Insecurity:   . Worried About Charity fundraiser in the Last Year: Not on file  . Ran Out of Food in the Last Year: Not on file  Transportation Needs:   . Lack of Transportation (Medical): Not on file  . Lack of Transportation (Non-Medical): Not on file  Physical Activity:   . Days of Exercise per  Week: Not on file  . Minutes of Exercise per Session: Not on file  Stress:   . Feeling of Stress : Not on file  Social Connections:   . Frequency of Communication with Friends and Family: Not on file  . Frequency of Social Gatherings with Friends and Family: Not on file  . Attends Religious Services: Not on file  . Active Member of Clubs or Organizations: Not on file  . Attends Archivist Meetings: Not on file  . Marital Status: Not on file   Family History  Problem Relation Age of Onset  . Heart disease Mother   . Heart attack Father   . Other Sister   . Brain cancer Sister   . Cancer Maternal Grandfather    Allergies  Allergen Reactions  . Amiodarone     sick  . Amoxicillin Nausea And Vomiting  . Amoxicillin-Pot Clavulanate Nausea And Vomiting  . Aspirin     Unknown... 325 mg and more.  . Ciprofloxacin      Flu symptoms  . Codeine Nausea And Vomiting  . Diuretic [Buchu-Cornsilk-Ch Grass-Hydran]     Pharmacy record  . Iohexol Hives     Code: HIVES, Desc: **11/22/08 **NO REACTION W/ OMNI 300// PT S/P 3 CT SCANS W/ IV CM AND NO HIVES/MMS**PT STATED HIVES 16 YRS AGO. POSSIBLE IV DYE W/ CT/XRAY. MEDICATED W/ 50MG  OF BENEDRYL PO PRIOR TO SCAN, Onset Date: [id number]   . Lisinopril     cough  . Midazolam Hcl Nausea And Vomiting  . Quinolones Other (See Comments)    Flu like symptoms  . Sotalol     Unknown   . Sulfa Antibiotics Other (See Comments)    Flu like symptoms 'serum sickness'  . Thioxanthenes Other (See Comments)    Flu like symptoms 'serum sickness'  . Valium Nausea And Vomiting  . Vancomycin     unknown  . Versed [Midazolam] Nausea And Vomiting   Prior to Admission medications   Medication Sig Start Date End Date Taking? Authorizing Provider  acetaminophen (TYLENOL) 500 MG tablet Take 500 mg by mouth every 6 (six) hours as needed for mild pain.   Yes [provider]  Ascorbic Acid (VITAMIN C) 1000 MG tablet Take 1,000 mg by mouth daily.   Yes [provider]  brimonidine (ALPHAGAN) 0.2 % ophthalmic solution Place 1 drop into both eyes 2 (two) times daily. 11/01/17  Yes [provider]  CHIA SEED PO Take 5 mLs by mouth daily.    Yes [provider]  cholecalciferol (VITAMIN D) 1000 UNITS tablet Take 1,000 Units by mouth daily.   Yes [provider]  Coenzyme Q10 150 MG CAPS Take by mouth 2 times daily at 12 noon and 4 pm.   Yes [provider]  Cyanocobalamin (VITAMIN B-12) 1000 MCG SUBL Place 1,000 mcg under the tongue daily.    Yes [provider]  ELIQUIS 5 MG TABS tablet TAKE 1 TABLET BY MOUTH TWICE DAILY. 06/22/19  Yes Belva Crome, MD  Flaxseed, Linseed, (FLAX SEEDS PO) Take 1 capsule by mouth daily.   Yes [provider]  LevOCARNitine (L-CARNITINE) 500 MG TABS Take 500 mg by mouth daily.   Yes [provider]  magnesium 30 MG tablet Take 30 mg by mouth daily.   Yes [provider]  metoprolol succinate (TOPROL-XL) 50 MG 24 hr tablet Take 50 mg by mouth 2 (two) times daily. Take with or immediately following a meal.  Yes [provider]  metoprolol tartrate (LOPRESSOR) 50 MG tablet TAKE 1 TABLET BY MOUTH TWICE DAILY. 07/23/19  Yes Burtis Junes, NP  Omega-3 Fatty Acids (FISH OIL) 1000 MG CAPS Take 2,000 mg by mouth daily.   Yes [provider]  OVER THE COUNTER MEDICATION Take 1 each by mouth daily. Hemp heart seeds   Yes [provider]  OVER THE COUNTER MEDICATION Take 1,250 mg by mouth daily. "Omega-Q squid supplement   Yes [provider]  OVER THE COUNTER MEDICATION 1 tumeric capsle by mouth twice daily   Yes [provider]  OVER THE COUNTER MEDICATION 1 Canna Complete supplement by mouth daily   Yes [provider]  PAPAYA PO Take 1 capsule by mouth daily.   Yes [provider]  polyethylene glycol (MIRALAX / GLYCOLAX) packet Take 17 g by mouth daily as needed for mild constipation.   Yes [provider]  Probiotic Product (PROBIOTIC DAILY PO) Take 1 capsule by mouth daily. [place]    Yes [provider]     Positive ROS: Otherwise negative  All other systems have been reviewed and were otherwise negative with the exception of those mentioned in the HPI and as above.  Physical Exam: Constitutional: Alert, well-appearing, no acute distress Ears: External ears without lesions or tenderness. Ear canals large amount of dried wax in both ear canals left side worse than right.  This was cleaned using curettes and forceps.  TMs were clear bilaterally. Nasal: External nose without lesions. Clear nasal passages Oral: Oropharynx clear. Neck: No palpable adenopathy or masses Respiratory: Breathing comfortably  Skin: No facial/neck lesions or rash noted.  Cerumen impaction  removal  Date/Time: 07/31/2019 4:25 PM Performed by: Rozetta Nunnery, MD Authorized by: Rozetta Nunnery, MD   Consent:    Consent obtained:  Verbal   Consent given by:  Patient   Risks discussed:  Pain and bleeding Procedure details:    Location:  L ear and R ear   Procedure type: curette and forceps   Post-procedure details:    Inspection:  TM intact and canal normal   Hearing quality:  Improved   Patient tolerance of procedure:  Tolerated well, no immediate complications    Assessment: Recurrent bilateral cerumen impactions  Plan: She will follow-up as needed  Radene Journey, MD

## 2019-08-05 DIAGNOSIS — H401123 Primary open-angle glaucoma, left eye, severe stage: Secondary | ICD-10-CM | POA: Diagnosis not present

## 2019-08-18 DIAGNOSIS — Z23 Encounter for immunization: Secondary | ICD-10-CM | POA: Diagnosis not present

## 2019-08-21 DIAGNOSIS — H401122 Primary open-angle glaucoma, left eye, moderate stage: Secondary | ICD-10-CM | POA: Diagnosis not present

## 2019-08-21 DIAGNOSIS — H401113 Primary open-angle glaucoma, right eye, severe stage: Secondary | ICD-10-CM | POA: Diagnosis not present

## 2019-11-10 ENCOUNTER — Ambulatory Visit (INDEPENDENT_AMBULATORY_CARE_PROVIDER_SITE_OTHER): Payer: Medicare Other | Admitting: Otolaryngology

## 2019-11-16 ENCOUNTER — Ambulatory Visit (INDEPENDENT_AMBULATORY_CARE_PROVIDER_SITE_OTHER): Payer: Medicare Other | Admitting: Otolaryngology

## 2019-11-16 ENCOUNTER — Other Ambulatory Visit: Payer: Self-pay

## 2019-11-16 ENCOUNTER — Encounter (INDEPENDENT_AMBULATORY_CARE_PROVIDER_SITE_OTHER): Payer: Self-pay | Admitting: Otolaryngology

## 2019-11-16 VITALS — Temp 95.9°F

## 2019-11-16 DIAGNOSIS — H6123 Impacted cerumen, bilateral: Secondary | ICD-10-CM | POA: Diagnosis not present

## 2019-11-16 NOTE — Progress Notes (Signed)
HPI: [name] is a 86 y.o. female who presents for evaluation of cerumen buildup in both ears left side worse than right.  She has used Debrox in the left ear.  She lives at [facility]..  Past Medical History:  Diagnosis Date  . Anxiety   . Arthritis    Some in neck and back, but "can't complain"  . Atypical atrial flutter ([place])   . Breast cancer ([place])   . Cancer [facility])    1998 Right breast cancer with lumpectomy and radiation therapy  . Chronic diastolic CHF (congestive heart failure) ([place])   . Complication of anesthesia    Nausea from versed  . GERD (gastroesophageal reflux disease)   . Glaucoma   . Hiatal hernia   . History of mitral valve repair    Mitral valve prolapse, surgery in 2010  . Hyponatremia   . Normocytic anemia 03/21/2014  . Persistent atrial fibrillation ([name])    s/p MAZE 2010  . Personal history of radiation therapy   . Rheumatic fever    age 55  . Thrombocytopenia ([place])    Past Surgical History:  Procedure Laterality Date  . ABDOMINAL HYSTERECTOMY    . APPENDECTOMY    . BACK SURGERY     Synovial cyst on spine, removed laproscopically in 2000?  [name] BREAST BIOPSY    . BREAST LUMPECTOMY     right 1998  . BREAST SURGERY     1998 Lumpectomy in right breast  . CARDIAC CATHETERIZATION    . CARDIOVERSION  04/29/2012   Procedure: CARDIOVERSION;  Surgeon: [name], MD;  Location: [facility] ENDOSCOPY;  Service: Cardiovascular;  Laterality: N/A;  . CARDIOVERSION  06/19/2012   Procedure: CARDIOVERSION;  Surgeon: Sinclair Grooms, MD;  Location: [facility] ENDOSCOPY;  Service: Cardiovascular;  Laterality: N/A;  h/p from 11/8 in file drawer/dl  . CATARACT EXTRACTION    . CESAREAN SECTION     Three C-Sections  . EYE SURGERY     Cataract Surgery, both eyes  . KIDNEY CYST REMOVAL    . MITRAL VALVE REPAIR  June 2010  . TEE WITHOUT CARDIOVERSION  04/29/2012   Procedure: TRANSESOPHAGEAL ECHOCARDIOGRAM (TEE);  Surgeon: [name], MD;  Location: [facility] ENDOSCOPY;  Service:  Cardiovascular;  Laterality: N/A;  . TONSILLECTOMY     Social History   Socioeconomic History  . Marital status: Widowed    Spouse name: Not on file  . Number of children: Not on file  . Years of education: Not on file  . Highest education level: Not on file  Occupational History  . Not on file  Tobacco Use  . Smoking status: Never Smoker  . Smokeless tobacco: Never Used  Substance and Sexual Activity  . Alcohol use: No  . Drug use: No  . Sexual activity: Never    Comment: Smoked one cigarette a day as a teen  Other Topics Concern  . Not on file  Social History Narrative   Pt lives in [place] alone.  Homemaker.  Widowed   Social Determinants of Radio broadcast assistant Strain:   . Difficulty of Paying Living Expenses:   Food Insecurity:   . Worried About Charity fundraiser in the Last Year:   . Arboriculturist in the Last Year:   Transportation Needs:   . Film/video editor (Medical):   [name] Lack of Transportation (Non-Medical):   Physical Activity:   . Days of Exercise per Week:   . Minutes  of Exercise per Session:   Stress:   . Feeling of Stress :   Social Connections:   . Frequency of Communication with Friends and Family:   . Frequency of Social Gatherings with Friends and Family:   . Attends Religious Services:   . Active Member of Clubs or Organizations:   . Attends Archivist Meetings:   [name] Marital Status:    Family History  Problem Relation Age of Onset  . Heart disease Mother   . Heart attack Father   . Other Sister   . Brain cancer Sister   . Cancer Maternal Grandfather    Allergies  Allergen Reactions  . Amiodarone     sick  . Amoxicillin Nausea And Vomiting  . Amoxicillin-Pot Clavulanate Nausea And Vomiting  . Aspirin     Unknown... 325 mg and more.  . Ciprofloxacin     Flu symptoms  . Codeine Nausea And Vomiting  . Diuretic [Buchu-Cornsilk-Ch Grass-Hydran]     Pharmacy record  . Iohexol Hives     Code: HIVES, Desc:  **11/22/08 **NO REACTION W/ OMNI 300// PT S/P 3 CT SCANS W/ IV CM AND NO HIVES/MMS**PT STATED HIVES 16 YRS AGO. POSSIBLE IV DYE W/ CT/XRAY. MEDICATED W/ 50MG  OF BENEDRYL PO PRIOR TO SCAN, Onset Date: [id number]   . Lisinopril     cough  . Midazolam Hcl Nausea And Vomiting  . Quinolones Other (See Comments)    Flu like symptoms  . Sotalol     Unknown   . Sulfa Antibiotics Other (See Comments)    Flu like symptoms 'serum sickness'  . Thioxanthenes Other (See Comments)    Flu like symptoms 'serum sickness'  . Valium Nausea And Vomiting  . Vancomycin     unknown  . Versed [Midazolam] Nausea And Vomiting   Prior to Admission medications   Medication Sig Start Date End Date Taking? Authorizing Provider  acetaminophen (TYLENOL) 500 MG tablet Take 500 mg by mouth every 6 (six) hours as needed for mild pain.   Yes [provider]  Ascorbic Acid (VITAMIN C) 1000 MG tablet Take 1,000 mg by mouth daily.   Yes [provider]  brimonidine (ALPHAGAN) 0.2 % ophthalmic solution Place 1 drop into both eyes 2 (two) times daily. 11/01/17  Yes [provider]  CHIA SEED PO Take 5 mLs by mouth daily.    Yes [provider]  cholecalciferol (VITAMIN D) 1000 UNITS tablet Take 1,000 Units by mouth daily.   Yes [provider]  Coenzyme Q10 150 MG CAPS Take by mouth 2 times daily at 12 noon and 4 pm.   Yes [provider]  Cyanocobalamin (VITAMIN B-12) 1000 MCG SUBL Place 1,000 mcg under the tongue daily.    Yes [provider]  ELIQUIS 5 MG TABS tablet TAKE 1 TABLET BY MOUTH TWICE DAILY. 06/22/19  Yes Belva Crome, MD  Flaxseed, Linseed, (FLAX SEEDS PO) Take 1 capsule by mouth daily.   Yes [provider]  LevOCARNitine (L-CARNITINE) 500 MG TABS Take 500 mg by mouth daily.   Yes [provider]  magnesium 30 MG tablet Take 30 mg by mouth daily.   Yes [provider]  metoprolol succinate (TOPROL-XL) 50 MG 24 hr tablet  Take 50 mg by mouth 2 (two) times daily. Take with or immediately following a meal.   Yes [provider]  metoprolol tartrate (LOPRESSOR) 50 MG tablet TAKE 1 TABLET BY MOUTH TWICE DAILY. 07/23/19  Yes Gerhardt,  Marlane Hatcher, NP  Omega-3 Fatty Acids (FISH OIL) 1000 MG CAPS Take 2,000 mg by mouth daily.   Yes [provider]  OVER THE COUNTER MEDICATION Take 1 each by mouth daily. Hemp heart seeds   Yes [provider]  OVER THE COUNTER MEDICATION Take 1,250 mg by mouth daily. "Omega-Q squid supplement   Yes [provider]  OVER THE COUNTER MEDICATION 1 tumeric capsle by mouth twice daily   Yes [provider]  OVER THE COUNTER MEDICATION 1 Canna Complete supplement by mouth daily   Yes [provider]  PAPAYA PO Take 1 capsule by mouth daily.   Yes [provider]  polyethylene glycol (MIRALAX / GLYCOLAX) packet Take 17 g by mouth daily as needed for mild constipation.   Yes [provider]  Probiotic Product (PROBIOTIC DAILY PO) Take 1 capsule by mouth daily. [place]    Yes [provider]     Positive ROS: Otherwise negative  All other systems have been reviewed and were otherwise negative with the exception of those mentioned in the HPI and as above.  Physical Exam: Constitutional: Alert, well-appearing, no acute distress Ears: External ears without lesions or tenderness. Ear canals she has a large amount of wax down in the left ear canal adjacent to the left TM a lot of this is white and kind of desquamated debris perhaps a slight inflammation.  This was cleaned with a #5 a #7 suction and hydroperoxide.  The TM was clear otherwise.  Right ear canal had dried brown wax that was removed rather easily and the right TM was clear.  I applied CSF powder to the left ear canal.. Nasal: External nose without lesions. Clear nasal passages Oral: Oropharynx clear. Neck: No palpable adenopathy or masses Respiratory:  Breathing comfortably  Skin: No facial/neck lesions or rash noted.  Cerumen impaction removal  Date/Time: 11/16/2019 1:48 PM Performed by: Rozetta Nunnery, MD Authorized by: Rozetta Nunnery, MD   Consent:    Consent obtained:  Verbal   Consent given by:  Patient   Risks discussed:  Pain and bleeding Procedure details:    Location:  L ear and R ear   Procedure type: curette, suction and forceps   Post-procedure details:    Inspection:  TM intact and canal normal   Hearing quality:  Improved   Patient tolerance of procedure:  Tolerated well, no immediate complications Comments:     Right TM was clear.  Left TM was clear but had debris adjacent to the TM.  After cleaning the left ear canal applied CSF powder.    Assessment: Cerumen impaction left side worse than right.  She heard much better after cleaning the ears.  Applied CSF powder to the left ear.  Plan: Recommended keeping the left ear dry and will follow up in approximately 3 months for recheck or earlier if she has any problems.  Radene Journey, MD

## 2019-11-24 DIAGNOSIS — G43109 Migraine with aura, not intractable, without status migrainosus: Secondary | ICD-10-CM | POA: Diagnosis not present

## 2019-11-24 DIAGNOSIS — I4891 Unspecified atrial fibrillation: Secondary | ICD-10-CM | POA: Diagnosis not present

## 2019-11-24 DIAGNOSIS — I509 Heart failure, unspecified: Secondary | ICD-10-CM | POA: Diagnosis not present

## 2019-11-24 DIAGNOSIS — K649 Unspecified hemorrhoids: Secondary | ICD-10-CM | POA: Diagnosis not present

## 2019-11-24 DIAGNOSIS — N390 Urinary tract infection, site not specified: Secondary | ICD-10-CM | POA: Diagnosis not present

## 2019-11-24 DIAGNOSIS — D6869 Other thrombophilia: Secondary | ICD-10-CM | POA: Diagnosis not present

## 2019-11-24 DIAGNOSIS — I10 Essential (primary) hypertension: Secondary | ICD-10-CM | POA: Diagnosis not present

## 2020-01-08 LAB — CBC AND DIFFERENTIAL
HCT: 38 (ref 36–46)
Hemoglobin: 13 (ref 12.0–16.0)
Platelets: 148 — AB (ref 150–399)
WBC: 5.1

## 2020-01-08 LAB — BASIC METABOLIC PANEL
BUN: 11 (ref 4–21)
CO2: 30 — AB (ref 13–22)
Chloride: 97 — AB (ref 99–108)
Creatinine: 0.6 (ref 0.5–1.1)
Glucose: 102
Potassium: 4.5 (ref 3.4–5.3)
Sodium: 134 — AB (ref 137–147)

## 2020-01-08 LAB — COMPREHENSIVE METABOLIC PANEL
GFR calc Af Amer: 111
GFR calc non Af Amer: 92

## 2020-01-08 LAB — HEPATIC FUNCTION PANEL
ALT: 11 (ref 7–35)
AST: 18 (ref 13–35)

## 2020-01-08 LAB — CBC: RBC: 3.7 — AB (ref 3.87–5.11)

## 2020-01-27 DIAGNOSIS — N390 Urinary tract infection, site not specified: Secondary | ICD-10-CM | POA: Diagnosis not present

## 2020-01-27 DIAGNOSIS — G629 Polyneuropathy, unspecified: Secondary | ICD-10-CM | POA: Diagnosis not present

## 2020-01-27 DIAGNOSIS — M6281 Muscle weakness (generalized): Secondary | ICD-10-CM | POA: Diagnosis not present

## 2020-02-05 ENCOUNTER — Ambulatory Visit: Payer: Medicare Other | Admitting: Interventional Cardiology

## 2020-02-09 DIAGNOSIS — G63 Polyneuropathy in diseases classified elsewhere: Secondary | ICD-10-CM | POA: Diagnosis not present

## 2020-02-09 DIAGNOSIS — Z9181 History of falling: Secondary | ICD-10-CM | POA: Diagnosis not present

## 2020-02-09 DIAGNOSIS — R278 Other lack of coordination: Secondary | ICD-10-CM | POA: Diagnosis not present

## 2020-02-09 DIAGNOSIS — R2689 Other abnormalities of gait and mobility: Secondary | ICD-10-CM | POA: Diagnosis not present

## 2020-02-11 DIAGNOSIS — G63 Polyneuropathy in diseases classified elsewhere: Secondary | ICD-10-CM | POA: Diagnosis not present

## 2020-02-11 DIAGNOSIS — Z9181 History of falling: Secondary | ICD-10-CM | POA: Diagnosis not present

## 2020-02-11 DIAGNOSIS — R2689 Other abnormalities of gait and mobility: Secondary | ICD-10-CM | POA: Diagnosis not present

## 2020-02-11 DIAGNOSIS — R278 Other lack of coordination: Secondary | ICD-10-CM | POA: Diagnosis not present

## 2020-02-15 ENCOUNTER — Encounter (INDEPENDENT_AMBULATORY_CARE_PROVIDER_SITE_OTHER): Payer: Self-pay | Admitting: Otolaryngology

## 2020-02-15 ENCOUNTER — Other Ambulatory Visit: Payer: Self-pay

## 2020-02-15 ENCOUNTER — Ambulatory Visit (INDEPENDENT_AMBULATORY_CARE_PROVIDER_SITE_OTHER): Payer: Medicare Other | Admitting: Otolaryngology

## 2020-02-15 VITALS — Temp 96.8°F

## 2020-02-15 DIAGNOSIS — H6123 Impacted cerumen, bilateral: Secondary | ICD-10-CM | POA: Diagnosis not present

## 2020-02-15 DIAGNOSIS — H60312 Diffuse otitis externa, left ear: Secondary | ICD-10-CM | POA: Diagnosis not present

## 2020-02-15 NOTE — Progress Notes (Signed)
HPI: [name] is a 86 y.o. female who presents for evaluation of blockage of her hearing left side worse than right.  She last had her ears cleaned a little over a year ago..  Past Medical History:  Diagnosis Date   Anxiety    Arthritis    Some in neck and back, but "can't complain"   Atypical atrial flutter ([place])    Breast cancer ([place])    Cancer ([place])    1998 Right breast cancer with lumpectomy and radiation therapy   Chronic diastolic CHF (congestive heart failure) (HCC)    Complication of anesthesia    Nausea from versed   GERD (gastroesophageal reflux disease)    Glaucoma    Hiatal hernia    History of mitral valve repair    Mitral valve prolapse, surgery in 2010   Hyponatremia    Normocytic anemia 03/21/2014   Persistent atrial fibrillation ([place])    s/p MAZE 2010   Personal history of radiation therapy    Rheumatic fever    age 79   Thrombocytopenia ([place])    Past Surgical History:  Procedure Laterality Date   ABDOMINAL HYSTERECTOMY     APPENDECTOMY     BACK SURGERY     Synovial cyst on spine, removed laproscopically in 2000?   BREAST BIOPSY     BREAST LUMPECTOMY     right [place] Lumpectomy in right breast   CARDIAC CATHETERIZATION     CARDIOVERSION  04/29/2012   Procedure: CARDIOVERSION;  Surgeon: [name], MD;  Location: [name] ENDOSCOPY;  Service: Cardiovascular;  Laterality: N/A;   CARDIOVERSION  06/19/2012   Procedure: CARDIOVERSION;  Surgeon: Sinclair Grooms, MD;  Location: [facility] ENDOSCOPY;  Service: Cardiovascular;  Laterality: N/A;  h/p from 11/8 in file drawer/dl   CATARACT EXTRACTION     CESAREAN SECTION     Three C-Sections   EYE SURGERY     Cataract Surgery, both eyes   KIDNEY CYST REMOVAL     MITRAL VALVE REPAIR  June 2010   TEE WITHOUT CARDIOVERSION  04/29/2012   Procedure: TRANSESOPHAGEAL ECHOCARDIOGRAM (TEE);  Surgeon: [name], MD;  Location: [facility] ENDOSCOPY;  Service:  Cardiovascular;  Laterality: N/A;   TONSILLECTOMY     Social History   Socioeconomic History   Marital status: Widowed    Spouse name: Not on file   Number of children: Not on file   Years of education: Not on file   Highest education level: Not on file  Occupational History   Not on file  Tobacco Use   Smoking status: Never Smoker   Smokeless tobacco: Never Used  Vaping Use   Vaping Use: Never used  Substance and Sexual Activity   Alcohol use: No   Drug use: No   Sexual activity: Never    Comment: Smoked one cigarette a day as a teen  Other Topics Concern   Not on file  Social History Narrative   Pt lives in [place] alone.  Homemaker.  Widowed   Social Determinants of Radio broadcast assistant Strain:    Difficulty of Paying Living Expenses:   Food Insecurity:    Worried About Charity fundraiser in the Last Year:    Arboriculturist in the Last Year:   Transportation Needs:    Film/video editor (Medical):    Lack of Transportation (Non-Medical):   Physical Activity:    Days of  Exercise per Week:    Minutes of Exercise per Session:   Stress:    Feeling of Stress :   Social Connections:    Frequency of Communication with Friends and Family:    Frequency of Social Gatherings with Friends and Family:    Attends Religious Services:    Active Member of Clubs or Organizations:    Attends Music therapist:    Marital Status:    Family History  Problem Relation Age of Onset   Heart disease Mother    Heart attack Father    Other Sister    Brain cancer Sister    Cancer Maternal Grandfather    Allergies  Allergen Reactions   Amiodarone     sick   Amoxicillin Nausea And Vomiting   Amoxicillin-Pot Clavulanate Nausea And Vomiting   Aspirin     Unknown... 325 mg and more.   Ciprofloxacin     Flu symptoms   Codeine Nausea And Vomiting   Diuretic [Buchu-Cornsilk-Ch Grass-Hydran]     Pharmacy record    Iohexol Hives     Code: HIVES, Desc: **11/22/08 **NO REACTION W/ OMNI 300// PT S/P 3 CT SCANS W/ IV CM AND NO HIVES/MMS**PT STATED HIVES 16 YRS AGO. POSSIBLE IV DYE W/ CT/XRAY. MEDICATED W/ 50MG  OF BENEDRYL PO PRIOR TO SCAN, Onset Date: [id number]    Lisinopril     cough   Midazolam Hcl Nausea And Vomiting   Quinolones Other (See Comments)    Flu like symptoms   Sotalol     Unknown    Sulfa Antibiotics Other (See Comments)    Flu like symptoms 'serum sickness'   Thioxanthenes Other (See Comments)    Flu like symptoms 'serum sickness'   Valium Nausea And Vomiting   Vancomycin     unknown   Versed [Midazolam] Nausea And Vomiting   Prior to Admission medications   Medication Sig Start Date End Date Taking? Authorizing Provider  acetaminophen (TYLENOL) 500 MG tablet Take 500 mg by mouth every 6 (six) hours as needed for mild pain.   Yes [provider]  Ascorbic Acid (VITAMIN C) 1000 MG tablet Take 1,000 mg by mouth daily.   Yes [provider]  brimonidine (ALPHAGAN) 0.2 % ophthalmic solution Place 1 drop into both eyes 2 (two) times daily. 11/01/17  Yes [provider]  CHIA SEED PO Take 5 mLs by mouth daily.    Yes [provider]  cholecalciferol (VITAMIN D) 1000 UNITS tablet Take 1,000 Units by mouth daily.   Yes [provider]  Coenzyme Q10 150 MG CAPS Take by mouth 2 times daily at 12 noon and 4 pm.   Yes [provider]  Cyanocobalamin (VITAMIN B-12) 1000 MCG SUBL Place 1,000 mcg under the tongue daily.    Yes [provider]  ELIQUIS 5 MG TABS tablet TAKE 1 TABLET BY MOUTH TWICE DAILY. 06/22/19  Yes Belva Crome, MD  Flaxseed, Linseed, (FLAX SEEDS PO) Take 1 capsule by mouth daily.   Yes [provider]  LevOCARNitine (L-CARNITINE) 500 MG TABS Take 500 mg by mouth daily.   Yes [provider]  magnesium 30 MG tablet Take 30 mg by mouth daily.   Yes [provider]  metoprolol  succinate (TOPROL-XL) 50 MG 24 hr tablet Take 50 mg by mouth 2 (two) times daily. Take with or immediately following a meal.   Yes [provider]  metoprolol tartrate (LOPRESSOR) 50 MG tablet TAKE 1 TABLET BY  MOUTH TWICE DAILY. 07/23/19  Yes Burtis Junes, NP  Omega-3 Fatty Acids (FISH OIL) 1000 MG CAPS Take 2,000 mg by mouth daily.   Yes [provider]  OVER THE COUNTER MEDICATION Take 1 each by mouth daily. Hemp heart seeds   Yes [provider]  OVER THE COUNTER MEDICATION Take 1,250 mg by mouth daily. "Omega-Q squid supplement   Yes [provider]  OVER THE COUNTER MEDICATION 1 tumeric capsle by mouth twice daily   Yes [provider]  OVER THE COUNTER MEDICATION 1 Canna Complete supplement by mouth daily   Yes [provider]  PAPAYA PO Take 1 capsule by mouth daily.   Yes [provider]  polyethylene glycol (MIRALAX / GLYCOLAX) packet Take 17 g by mouth daily as needed for mild constipation.   Yes [provider]  Probiotic Product (PROBIOTIC DAILY PO) Take 1 capsule by mouth daily. Watchtower    Yes [provider]     Positive ROS: Otherwise negative  All other systems have been reviewed and were otherwise negative with the exception of those mentioned in the HPI and as above.  Physical Exam: Constitutional: Alert, well-appearing, no acute distress Ears: External ears without lesions or tenderness. Ear canals left ear canal is completely occluded with cerumen with some mild inflammatory changes.  Left ear canal was cleaned with suction and hydrogen peroxide.  After cleaning the ear canal I applied gentian violet, Ciprodex and CSF powder to the left ear.  The TM was intact and clear.  Right ear canal had cerumen that was normal color and more laterally displaced that was removed with a curette.  Right TM was clear.. Nasal: External nose without lesions. Clear nasal passages Oral: Oropharynx  clear. Neck: No palpable adenopathy or masses Respiratory: Breathing comfortably  Skin: No facial/neck lesions or rash noted.  Cerumen impaction removal  Date/Time: 02/15/2020 3:47 PM Performed by: Rozetta Nunnery, MD Authorized by: Rozetta Nunnery, MD   Consent:    Consent obtained:  Verbal   Consent given by:  Patient   Risks discussed:  Pain and bleeding Procedure details:    Location:  L ear and R ear   Procedure type: curette, suction and forceps   Post-procedure details:    Inspection:  TM intact and canal normal   Hearing quality:  Improved   Patient tolerance of procedure:  Tolerated well, no immediate complications Comments:     Left ear canal with mild inflammatory changes and squamous debris.  Left TM is little thickened.  Right TM is clear.    Assessment: Cerumen buildup with mild left inflammatory changes of the external ear canal.  Plan: She will follow-up in in 7 to 8 months for recheck and cleaning.  As she has small ear canals and mild chronic changes on the left side.  Radene Journey, MD

## 2020-02-16 DIAGNOSIS — Z9181 History of falling: Secondary | ICD-10-CM | POA: Diagnosis not present

## 2020-02-16 DIAGNOSIS — R2689 Other abnormalities of gait and mobility: Secondary | ICD-10-CM | POA: Diagnosis not present

## 2020-02-16 DIAGNOSIS — G63 Polyneuropathy in diseases classified elsewhere: Secondary | ICD-10-CM | POA: Diagnosis not present

## 2020-02-16 DIAGNOSIS — R278 Other lack of coordination: Secondary | ICD-10-CM | POA: Diagnosis not present

## 2020-02-19 DIAGNOSIS — G63 Polyneuropathy in diseases classified elsewhere: Secondary | ICD-10-CM | POA: Diagnosis not present

## 2020-02-19 DIAGNOSIS — Z9181 History of falling: Secondary | ICD-10-CM | POA: Diagnosis not present

## 2020-02-19 DIAGNOSIS — R2689 Other abnormalities of gait and mobility: Secondary | ICD-10-CM | POA: Diagnosis not present

## 2020-02-19 DIAGNOSIS — R278 Other lack of coordination: Secondary | ICD-10-CM | POA: Diagnosis not present

## 2020-02-23 DIAGNOSIS — R2689 Other abnormalities of gait and mobility: Secondary | ICD-10-CM | POA: Diagnosis not present

## 2020-02-23 DIAGNOSIS — Z9181 History of falling: Secondary | ICD-10-CM | POA: Diagnosis not present

## 2020-02-23 DIAGNOSIS — R278 Other lack of coordination: Secondary | ICD-10-CM | POA: Diagnosis not present

## 2020-02-23 DIAGNOSIS — N390 Urinary tract infection, site not specified: Secondary | ICD-10-CM | POA: Diagnosis not present

## 2020-02-23 DIAGNOSIS — G63 Polyneuropathy in diseases classified elsewhere: Secondary | ICD-10-CM | POA: Diagnosis not present

## 2020-02-26 DIAGNOSIS — Z9181 History of falling: Secondary | ICD-10-CM | POA: Diagnosis not present

## 2020-02-26 DIAGNOSIS — G63 Polyneuropathy in diseases classified elsewhere: Secondary | ICD-10-CM | POA: Diagnosis not present

## 2020-02-26 DIAGNOSIS — R278 Other lack of coordination: Secondary | ICD-10-CM | POA: Diagnosis not present

## 2020-02-26 DIAGNOSIS — R2689 Other abnormalities of gait and mobility: Secondary | ICD-10-CM | POA: Diagnosis not present

## 2020-03-04 DIAGNOSIS — R278 Other lack of coordination: Secondary | ICD-10-CM | POA: Diagnosis not present

## 2020-03-04 DIAGNOSIS — R2689 Other abnormalities of gait and mobility: Secondary | ICD-10-CM | POA: Diagnosis not present

## 2020-03-04 DIAGNOSIS — Z9181 History of falling: Secondary | ICD-10-CM | POA: Diagnosis not present

## 2020-03-04 DIAGNOSIS — G63 Polyneuropathy in diseases classified elsewhere: Secondary | ICD-10-CM | POA: Diagnosis not present

## 2020-03-15 DIAGNOSIS — R300 Dysuria: Secondary | ICD-10-CM | POA: Diagnosis not present

## 2020-03-15 DIAGNOSIS — N3020 Other chronic cystitis without hematuria: Secondary | ICD-10-CM | POA: Diagnosis not present

## 2020-03-15 DIAGNOSIS — M62838 Other muscle spasm: Secondary | ICD-10-CM | POA: Diagnosis not present

## 2020-03-15 DIAGNOSIS — N3946 Mixed incontinence: Secondary | ICD-10-CM | POA: Diagnosis not present

## 2020-03-15 DIAGNOSIS — M6289 Other specified disorders of muscle: Secondary | ICD-10-CM | POA: Diagnosis not present

## 2020-03-15 DIAGNOSIS — M6281 Muscle weakness (generalized): Secondary | ICD-10-CM | POA: Diagnosis not present

## 2020-03-17 ENCOUNTER — Encounter: Payer: Self-pay | Admitting: Interventional Cardiology

## 2020-03-17 ENCOUNTER — Other Ambulatory Visit: Payer: Self-pay

## 2020-03-17 ENCOUNTER — Other Ambulatory Visit: Payer: Self-pay | Admitting: Interventional Cardiology

## 2020-03-17 ENCOUNTER — Ambulatory Visit (INDEPENDENT_AMBULATORY_CARE_PROVIDER_SITE_OTHER): Payer: Medicare Other | Admitting: Interventional Cardiology

## 2020-03-17 VITALS — BP 128/76 | HR 79 | Ht 62.0 in | Wt 129.2 lb

## 2020-03-17 DIAGNOSIS — R42 Dizziness and giddiness: Secondary | ICD-10-CM

## 2020-03-17 DIAGNOSIS — Z5181 Encounter for therapeutic drug level monitoring: Secondary | ICD-10-CM | POA: Diagnosis not present

## 2020-03-17 DIAGNOSIS — Z9889 Other specified postprocedural states: Secondary | ICD-10-CM

## 2020-03-17 DIAGNOSIS — Z7189 Other specified counseling: Secondary | ICD-10-CM

## 2020-03-17 DIAGNOSIS — I484 Atypical atrial flutter: Secondary | ICD-10-CM

## 2020-03-17 DIAGNOSIS — Z7901 Long term (current) use of anticoagulants: Secondary | ICD-10-CM

## 2020-03-17 DIAGNOSIS — I4819 Other persistent atrial fibrillation: Secondary | ICD-10-CM

## 2020-03-17 NOTE — Telephone Encounter (Signed)
Eliquis 5mg  refill request received. Patient is 86 years old, weight-65.5kg, Crea-0.61 on 01/08/2020 via [facility] at [facility] PCP, Diagnosis-Afib, and last seen by Dr. Tamala Julian on 04/21/2019 and has an appt pending on today at 140pm. Dose is appropriate based on dosing criteria. Will send in refill to requested pharmacy.

## 2020-03-17 NOTE — Patient Instructions (Signed)
Medication Instructions:  1) DECREASE Metoprolol to 50mg  in the morning and 25mg  in the evening.  Call me today or tomorrow and let me know if you are taking Metoprolol Tartrate or Succinate so we can get your medication list corrected.   *If you need a refill on your cardiac medications before your next appointment, please call your pharmacy*   Lab Work: None If you have labs (blood work) drawn today and your tests are completely normal, you will receive your results only by: [name] MyChart Message (if you have MyChart) OR . A paper copy in the mail If you have any lab test that is abnormal or we need to change your treatment, we will call you to review the results.   Testing/Procedures: None   Follow-Up: At [facility], you and your health needs are our priority.  As part of our continuing mission to provide you with exceptional heart care, we have created designated Provider Care Teams.  These Care Teams include your primary Cardiologist (physician) and Advanced Practice Providers (APPs -  Physician Assistants and Nurse Practitioners) who all work together to provide you with the care you need, when you need it.  We recommend signing up for the patient portal called "MyChart".  Sign up information is provided on this After Visit Summary.  MyChart is used to connect with patients for Virtual Visits (Telemedicine).  Patients are able to view lab/test results, encounter notes, upcoming appointments, etc.  Non-urgent messages can be sent to your provider as well.   To learn more about what you can do with MyChart, go to [email or web address].    Your next appointment:   12 month(s)  The format for your next appointment:   In Person  Provider:   You may see Sinclair Grooms, MD or one of the following Advanced Practice Providers on your designated Care Team:    Truitt Merle, NP  Cecilie Kicks, NP  Kathyrn Drown, NP    Other Instructions

## 2020-03-17 NOTE — Progress Notes (Signed)
Cardiology Office Note:    Date:  03/17/2020   ID:  [name], DOB [birth year], MRN [id number]  PCP:  Josetta Huddle, MD  Cardiologist:  Sinclair Grooms, MD   Referring MD: Josetta Huddle, MD   No chief complaint on file.   History of Present Illness:    [name] is a 86 y.o. female with a hx of mitral valve repair, persistent atrial fibrillation, diastolic heart failure, and chronic anticoagulation therapy.Most recently noted to be in atrial flutter with rapid ventricular response.Persistent atrial flutter with poor rate control has been a problem.  She is having difficulty with balance and falling.  She is not fainting.  Different activities when she is up and moving about can cause her to feel that things are spinning.  This causes loss of balance and down she goes.  She denies shortness of breath, orthopnea, PND, lower extremity swelling, and difficulty sleeping.  Past Medical History:  Diagnosis Date  . Anxiety   . Arthritis    Some in neck and back, but "can't complain"  . Atypical atrial flutter ([place])   . Breast cancer ([place])   . Cancer [facility])    1998 Right breast cancer with lumpectomy and radiation therapy  . Chronic diastolic CHF (congestive heart failure) ([place])   . Complication of anesthesia    Nausea from versed  . GERD (gastroesophageal reflux disease)   . Glaucoma   . Hiatal hernia   . History of mitral valve repair    Mitral valve prolapse, surgery in 2010  . Hyponatremia   . Normocytic anemia 03/21/2014  . Persistent atrial fibrillation ([place])    s/p MAZE 2010  . Personal history of radiation therapy   . Rheumatic fever    age 52  . Thrombocytopenia ([place])     Past Surgical History:  Procedure Laterality Date  . ABDOMINAL HYSTERECTOMY    . APPENDECTOMY    . BACK SURGERY     Synovial cyst on spine, removed laproscopically in 2000?  [name] BREAST BIOPSY    . BREAST LUMPECTOMY     right 1998  . BREAST SURGERY     1998 Lumpectomy in right breast    . CARDIAC CATHETERIZATION    . CARDIOVERSION  04/29/2012   Procedure: CARDIOVERSION;  Surgeon: [name], MD;  Location: [facility] ENDOSCOPY;  Service: Cardiovascular;  Laterality: N/A;  . CARDIOVERSION  06/19/2012   Procedure: CARDIOVERSION;  Surgeon: Sinclair Grooms, MD;  Location: [facility] ENDOSCOPY;  Service: Cardiovascular;  Laterality: N/A;  h/p from 11/8 in file drawer/dl  . CATARACT EXTRACTION    . CESAREAN SECTION     Three C-Sections  . EYE SURGERY     Cataract Surgery, both eyes  . KIDNEY CYST REMOVAL    . MITRAL VALVE REPAIR  June 2010  . TEE WITHOUT CARDIOVERSION  04/29/2012   Procedure: TRANSESOPHAGEAL ECHOCARDIOGRAM (TEE);  Surgeon: [name], MD;  Location: [facility] ENDOSCOPY;  Service: Cardiovascular;  Laterality: N/A;  . TONSILLECTOMY      Current Medications: Current Meds  Medication Sig  . acetaminophen (TYLENOL) 500 MG tablet Take 500 mg by mouth every 6 (six) hours as needed for mild pain.  . Ascorbic Acid (VITAMIN C) 1000 MG tablet Take 1,000 mg by mouth daily.  . brimonidine (ALPHAGAN) 0.2 % ophthalmic solution Place 1 drop into both eyes 2 (two) times daily.  [name] CHIA SEED PO Take 5 mLs by mouth daily.   . cholecalciferol (VITAMIN D)  1000 UNITS tablet Take 1,000 Units by mouth daily.  . Coenzyme Q10 150 MG CAPS Take by mouth 2 times daily at 12 noon and 4 pm.  . Cyanocobalamin (VITAMIN B-12) 1000 MCG SUBL Place 1,000 mcg under the tongue daily.   [name] ELIQUIS 5 MG TABS tablet TAKE 1 TABLET BY MOUTH TWICE DAILY.  [name] Flaxseed, Linseed, (FLAX SEEDS PO) Take 1 capsule by mouth daily.  . LevOCARNitine (L-CARNITINE) 500 MG TABS Take 500 mg by mouth daily.  . magnesium 30 MG tablet Take 30 mg by mouth daily.  . metoprolol succinate (TOPROL-XL) 50 MG 24 hr tablet Take 50 mg by mouth 2 (two) times daily. Take with or immediately following a meal.  . metoprolol tartrate (LOPRESSOR) 50 MG tablet TAKE 1 TABLET BY MOUTH TWICE DAILY.  [name] Omega-3 Fatty Acids (FISH OIL) 1000 MG CAPS  Take 2,000 mg by mouth daily.  [name] OVER THE COUNTER MEDICATION Take 1 each by mouth daily. Hemp heart seeds  . OVER THE COUNTER MEDICATION Take 1,250 mg by mouth daily. "Omega-Q squid supplement  . OVER THE COUNTER MEDICATION 1 tumeric capsle by mouth twice daily  . OVER THE COUNTER MEDICATION 1 Canna Complete supplement by mouth daily  . PAPAYA PO Take 1 capsule by mouth daily.  . polyethylene glycol (MIRALAX / GLYCOLAX) packet Take 17 g by mouth daily as needed for mild constipation.  . Probiotic Product (PROBIOTIC DAILY PO) Take 1 capsule by mouth daily. 10 Billi CFU      Allergies:   Amiodarone, Amoxicillin, Amoxicillin-pot clavulanate, Aspirin, Ciprofloxacin, Codeine, Diuretic [buchu-cornsilk-ch grass-hydran], Iohexol, Lisinopril, Midazolam hcl, Quinolones, Sotalol, Sulfa antibiotics, Thioxanthenes, Valium, Vancomycin, and Versed [midazolam]   Social History   Socioeconomic History  . Marital status: Widowed    Spouse name: Not on file  . Number of children: Not on file  . Years of education: Not on file  . Highest education level: Not on file  Occupational History  . Not on file  Tobacco Use  . Smoking status: Never Smoker  . Smokeless tobacco: Never Used  Vaping Use  . Vaping Use: Never used  Substance and Sexual Activity  . Alcohol use: No  . Drug use: No  . Sexual activity: Never    Comment: Smoked one cigarette a day as a teen  Other Topics Concern  . Not on file  Social History Narrative   Pt lives in [place] alone.  Homemaker.  Widowed   Social Determinants of Radio broadcast assistant Strain:   . Difficulty of Paying Living Expenses: Not on file  Food Insecurity:   . Worried About Charity fundraiser in the Last Year: Not on file  . Ran Out of Food in the Last Year: Not on file  Transportation Needs:   . Lack of Transportation (Medical): Not on file  . Lack of Transportation (Non-Medical): Not on file  Physical Activity:   . Days of Exercise per Week:  Not on file  . Minutes of Exercise per Session: Not on file  Stress:   . Feeling of Stress : Not on file  Social Connections:   . Frequency of Communication with Friends and Family: Not on file  . Frequency of Social Gatherings with Friends and Family: Not on file  . Attends Religious Services: Not on file  . Active Member of Clubs or Organizations: Not on file  . Attends Archivist Meetings: Not on file  . Marital Status: Not on file  Family History: The patient's family history includes Brain cancer in her sister; Cancer in her maternal grandfather; Heart attack in her father; Heart disease in her mother; Other in her sister.  ROS:   Please see the history of present illness.    She has recurrent urinary tract infections and feels that being on antibiotics caused her to start having vertigo.  She has not had chills or fever.  She has a dry hacking cough.  Otherwise no complaints.  All other systems reviewed and are negative.  EKGs/Labs/Other Studies Reviewed:    The following studies were reviewed today: No new cardiac evaluation  EKG:  EKG 2-1 AV conduction with slow atrial flutter/atrial tachycardia at ventricular rate of 80 bpm.  Atrial rate 160 bpm.  Rate control is not as good on today's tracing when compared to August 2020.  Recent Labs: No results found for requested labs within last 8760 hours.  Recent Lipid Panel No results found for: CHOL, TRIG, HDL, CHOLHDL, VLDL, LDLCALC, LDLDIRECT  Physical Exam:    VS:  BP 128/76   Pulse 79   Ht 5\' 2"  (1.575 m)   Wt 129 lb 3.2 oz (58.6 kg)   BMI 23.63 kg/m     ORTHOSTATIC blood pressures: Lying: 118/60 mmHg heart rate 80 Sitting: 116/70 mmHg heart rate 80 bpm Standing 120/70 mmHg, heart rate 80.  Wt Readings from Last 3 Encounters:  03/17/20 129 lb 3.2 oz (58.6 kg)  04/21/19 144 lb 6.4 oz (65.5 kg)  02/10/19 142 lb (64.4 kg)     GEN: Appears younger than stated age. No acute distress HEENT:  Normal NECK: No JVD. LYMPHATICS: No lymphadenopathy CARDIAC:  RRR without murmur, gallop, or edema. VASCULAR:  Normal Pulses. No bruits. RESPIRATORY:  Clear to auscultation without rales, wheezing or rhonchi  ABDOMEN: Soft, non-tender, non-distended, No pulsatile mass, MUSCULOSKELETAL: No deformity  SKIN: Warm and dry NEUROLOGIC:  Alert and oriented x 3 PSYCHIATRIC:  Normal affect   ASSESSMENT:    1. Persistent atrial fibrillation (HCC)   2. Vertigo   3. S/P mitral valve repair   4. Atypical atrial flutter ([place])   5. Anticoagulation goal of INR 2 to 3   6. Educated about COVID-19 virus infection    PLAN:    In order of problems listed above:  1. He has persistent atrial tachycardia/flutter with AV conduction block.  Asymptomatic. 2. Vertigo or possibly cerebral hypoperfusion related to relatively low blood pressure.  Decrease metoprolol to 50 mg a.m. and 25 mg p.m.  She will call us and let us know if she is on succinate or tartrate.  Goal is to decrease to 50 mg of metoprolol daily to allow slightly better blood pressure and hopefully help with instability. 3. No evidence of mitral valve dysfunction on auscultation 4. CV above commentary on the atrial fibrillation 5. Continue anticoagulation with apixaban 6. Continue with mitigation techniques.  Third dose of Moderna when available.   Medication Adjustments/Labs and Tests Ordered: Current medicines are reviewed at length with the patient today.  Concerns regarding medicines are outlined above.  No orders of the defined types were placed in this encounter.  No orders of the defined types were placed in this encounter.   Patient Instructions  Medication Instructions:  1) DECREASE Metoprolol to 50mg  in the morning and 25mg  in the evening.  Call me today or tomorrow and let me know if you are taking Metoprolol Tartrate or Succinate so we can get your medication list corrected.   *  If you need a refill on your cardiac  medications before your next appointment, please call your pharmacy*   Lab Work: None If you have labs (blood work) drawn today and your tests are completely normal, you will receive your results only by: [name] MyChart Message (if you have MyChart) OR . A paper copy in the mail If you have any lab test that is abnormal or we need to change your treatment, we will call you to review the results.   Testing/Procedures: None   Follow-Up: At [facility], you and your health needs are our priority.  As part of our continuing mission to provide you with exceptional heart care, we have created designated Provider Care Teams.  These Care Teams include your primary Cardiologist (physician) and Advanced Practice Providers (APPs -  Physician Assistants and Nurse Practitioners) who all work together to provide you with the care you need, when you need it.  We recommend signing up for the patient portal called "MyChart".  Sign up information is provided on this After Visit Summary.  MyChart is used to connect with patients for Virtual Visits (Telemedicine).  Patients are able to view lab/test results, encounter notes, upcoming appointments, etc.  Non-urgent messages can be sent to your provider as well.   To learn more about what you can do with MyChart, go to [email or web address].    Your next appointment:   12 month(s)  The format for your next appointment:   In Person  Provider:   You may see Sinclair Grooms, MD or one of the following Advanced Practice Providers on your designated Care Team:    Truitt Merle, NP  Cecilie Kicks, NP  Kathyrn Drown, NP    Other Instructions      Signed, Sinclair Grooms, MD  03/17/2020 2:51 PM    [place]

## 2020-03-18 ENCOUNTER — Telehealth: Payer: Self-pay | Admitting: Interventional Cardiology

## 2020-03-18 MED ORDER — METOPROLOL TARTRATE 50 MG PO TABS: ORAL_TABLET | ORAL | 2 refills | Status: DC

## 2020-03-18 NOTE — Addendum Note (Signed)
Addended by: Patterson Hammersmith A on: 03/18/2020 04:47 PM   Modules accepted: Orders

## 2020-03-18 NOTE — Telephone Encounter (Signed)
Patient states she was told to call this morning in regards to her metoprolol. She states she takes metoprolol tartrate (LOPRESSOR) 50 MG tablet. She states she took half a tablet and this morning her BP and HR has gone up, 130/73 HR 85.  [name]

## 2020-03-18 NOTE — Telephone Encounter (Signed)
Spoke with pt and she states she has been taking Metoprolol Tartrate.  Took a half tablet last night and vitals this morning were 130/73, HR 85. Has not had medication yet this morning.  Told pt to take meds and check BP 2-3 hours later.  Advised to call if she starts to see her vitals trend up after dose change.  Pt verbalized understanding and was in agreement with this plan.

## 2020-03-23 ENCOUNTER — Telehealth: Payer: Self-pay | Admitting: Interventional Cardiology

## 2020-03-23 MED ORDER — METOPROLOL TARTRATE 50 MG PO TABS: 50.0000 mg | ORAL_TABLET | Freq: Two times a day (BID) | ORAL | 3 refills | Status: DC

## 2020-03-23 NOTE — Telephone Encounter (Signed)
Pt c/o medication issue:  1. Name of Medication: Metoprolol  2. How are you currently taking this medication (dosage and times per day)? Dose was lowered and she takes it 2 times aday  3. Are you having a reaction (difficulty breathing--STAT)no  4. What is your medication issue? Could not sleep and her heart was doing crazy things-felt like this when she took the lower dose

## 2020-03-23 NOTE — Telephone Encounter (Signed)
Spoke with pt and she states she had a terrible night a couple of nights ago where her "heart was going crazy" and she couldn't sleep.  Last night pt took Metoprolol 50mg  instead of 25mg  and she slept great.  Spoke with Dr. Tamala Julian and he said ok to go back to 50mg  BID.  Pt appreciative for call.

## 2020-03-24 DIAGNOSIS — N3020 Other chronic cystitis without hematuria: Secondary | ICD-10-CM | POA: Diagnosis not present

## 2020-03-24 DIAGNOSIS — N390 Urinary tract infection, site not specified: Secondary | ICD-10-CM | POA: Diagnosis not present

## 2020-03-24 DIAGNOSIS — N3946 Mixed incontinence: Secondary | ICD-10-CM | POA: Diagnosis not present

## 2020-03-24 DIAGNOSIS — R011 Cardiac murmur, unspecified: Secondary | ICD-10-CM | POA: Diagnosis not present

## 2020-03-24 DIAGNOSIS — D49519 Neoplasm of unspecified behavior of unspecified kidney: Secondary | ICD-10-CM | POA: Diagnosis not present

## 2020-03-24 DIAGNOSIS — R300 Dysuria: Secondary | ICD-10-CM | POA: Diagnosis not present

## 2020-03-24 DIAGNOSIS — M62838 Other muscle spasm: Secondary | ICD-10-CM | POA: Diagnosis not present

## 2020-03-24 DIAGNOSIS — M6289 Other specified disorders of muscle: Secondary | ICD-10-CM | POA: Diagnosis not present

## 2020-03-24 DIAGNOSIS — Z0000 Encounter for general adult medical examination without abnormal findings: Secondary | ICD-10-CM | POA: Diagnosis not present

## 2020-03-24 DIAGNOSIS — M6281 Muscle weakness (generalized): Secondary | ICD-10-CM | POA: Diagnosis not present

## 2020-04-01 ENCOUNTER — Ambulatory Visit: Payer: Medicare Other | Admitting: Interventional Cardiology

## 2020-04-01 DIAGNOSIS — R8290 Unspecified abnormal findings in urine: Secondary | ICD-10-CM | POA: Diagnosis not present

## 2020-04-01 DIAGNOSIS — Z23 Encounter for immunization: Secondary | ICD-10-CM | POA: Diagnosis not present

## 2020-04-01 DIAGNOSIS — Z79899 Other long term (current) drug therapy: Secondary | ICD-10-CM | POA: Diagnosis not present

## 2020-04-01 DIAGNOSIS — R42 Dizziness and giddiness: Secondary | ICD-10-CM | POA: Diagnosis not present

## 2020-04-01 DIAGNOSIS — G629 Polyneuropathy, unspecified: Secondary | ICD-10-CM | POA: Diagnosis not present

## 2020-04-01 DIAGNOSIS — M6281 Muscle weakness (generalized): Secondary | ICD-10-CM | POA: Diagnosis not present

## 2020-04-06 ENCOUNTER — Encounter: Payer: Self-pay | Admitting: Internal Medicine

## 2020-04-06 ENCOUNTER — Non-Acute Institutional Stay (SKILLED_NURSING_FACILITY): Payer: Medicare Other | Admitting: Internal Medicine

## 2020-04-06 DIAGNOSIS — K219 Gastro-esophageal reflux disease without esophagitis: Secondary | ICD-10-CM | POA: Diagnosis not present

## 2020-04-06 DIAGNOSIS — K529 Noninfective gastroenteritis and colitis, unspecified: Secondary | ICD-10-CM

## 2020-04-06 DIAGNOSIS — R42 Dizziness and giddiness: Secondary | ICD-10-CM

## 2020-04-06 DIAGNOSIS — N3020 Other chronic cystitis without hematuria: Secondary | ICD-10-CM

## 2020-04-06 DIAGNOSIS — I4819 Other persistent atrial fibrillation: Secondary | ICD-10-CM

## 2020-04-06 DIAGNOSIS — R296 Repeated falls: Secondary | ICD-10-CM | POA: Diagnosis not present

## 2020-04-06 DIAGNOSIS — D649 Anemia, unspecified: Secondary | ICD-10-CM

## 2020-04-06 DIAGNOSIS — G3184 Mild cognitive impairment, so stated: Secondary | ICD-10-CM | POA: Diagnosis not present

## 2020-04-06 NOTE — Progress Notes (Signed)
Provider:  Rexene Edison. Mariea Clonts, D.O., C.M.D. Location:  [facility] Room Number: 154 Place of Service:  SNF (31)  Previous PCP: Josetta Huddle, MD   Patient Care Team: Josetta Huddle, MD as PCP - General (Internal Medicine) Belva Crome, MD as PCP - Cardiology (Cardiology) Constance Haw, MD as PCP - Electrophysiology (Cardiology)  Extended Emergency Contact Information Primary Emergency Contact: Berneda Rose Address: [address]          [place] Phone: [phone] Mobile Phone: [phone] Relation: Son Secondary Emergency Contact: Ron Parker [place] Phone: [phone] Mobile Phone: [phone] Relation: Son  Code Status: DNR--discussed with her today and goldenrod completed--pt still had to sign facility code agreement  Goals of Care: Advanced Directive information Advanced Directives 04/06/2020  Does Patient Have a Medical Advance Directive? Yes  Type of Paramedic of [place];Out of facility DNR (pink MOST or yellow form);Living will  Does patient want to make changes to medical advance directive? No - Patient declined  Copy of [place] in Chart? Yes - validated most recent copy scanned in chart (See row information)  Would patient like information on creating a medical advance directive? -  Pre-existing out of facility DNR order (yellow form or pink MOST form) Pink MOST/Yellow Form most recent copy in chart - Physician notified to receive inpatient order   Chief Complaint  Patient presents with  . New Admit To SNF    New admit to Rehab   HPI: Patient is a 86 y.o. female seen today for admission to [facility] for rehab after several months of challenges with her afib, falls and progressive weakness.  She has plans to move to assisted living from rehab.  Her son was present for the visit.  She has three sons.    She  sustained a fall on 9/9 and reported to IL nurse that she had been having challenges with "sinking spells" and dizziness.  She had her upcoming appt with Dr. Tamala Julian from cardiology to address it.  She was also being treated for UTIs.  She shared that she's had recurrent UTIs and was struggling to get them treated.  She has historically responded well to rocephin 2g IM injections.  She has been using D mannose as recommended by the therapist through urology.   9/13, she continued to have dizziness and requested home care.  She had already had her metoprolol reduced, but still had these symptoms.    9/15, she was shaking getting into the shower and struggling with laundry and low level.  BP had gone crazy (high) with less metoprolol so she increased it back up and notified Dr. Tamala Julian.  9/19, she sustained another fall with abrasions of her left thigh, bruising there.  9/20, her son had suggested she move to AL.    She did not feel well on 9/21 and called IL nurse.  She had some nausea, vomiting and pasty stool.    She then had an appt with Dr. Inda Merlin 9/24 and paperwork was completed for her to come to rehab and transition to AL.  She admits her memory is not what it used to be.  She has been walking with her rollator and the last time she got therapy, Thamas Jaegers "worked miracles" for her.  She's determined to get stronger again.  She is accepting of the transition to AL.    She also plans to switch over her care  to [facility] with her move to AL.   Since she's been here, she's had intermittent nausea and has had some constipation for which her miralax is now scheduled.  We clarified and removed the majority of her supplements that she had not recently used anyway.  Past Medical History:  Diagnosis Date  . Anxiety   . Arthritis    Some in neck and back, but "can't complain"  . Atypical atrial flutter ([place])   . Breast cancer ([place])   . Cancer [facility])    1998 Right breast cancer with lumpectomy and radiation therapy   . Chronic diastolic CHF (congestive heart failure) ([place])   . Complication of anesthesia    Nausea from versed  . GERD (gastroesophageal reflux disease)   . Glaucoma   . Hiatal hernia   . History of mitral valve repair    Mitral valve prolapse, surgery in 2010  . Hyponatremia   . Normocytic anemia 03/21/2014  . Persistent atrial fibrillation ([place])    s/p MAZE 2010  . Personal history of radiation therapy   . Rheumatic fever    age 89  . Thrombocytopenia ([place])    Past Surgical History:  Procedure Laterality Date  . ABDOMINAL HYSTERECTOMY    . APPENDECTOMY    . BACK SURGERY     Synovial cyst on spine, removed laproscopically in 2000?  [name] BREAST BIOPSY    . BREAST LUMPECTOMY     right 1998  . BREAST SURGERY     1998 Lumpectomy in right breast  . CARDIAC CATHETERIZATION    . CARDIOVERSION  04/29/2012   Procedure: CARDIOVERSION;  Surgeon: [name], MD;  Location: [facility] ENDOSCOPY;  Service: Cardiovascular;  Laterality: N/A;  . CARDIOVERSION  06/19/2012   Procedure: CARDIOVERSION;  Surgeon: Sinclair Grooms, MD;  Location: [facility] ENDOSCOPY;  Service: Cardiovascular;  Laterality: N/A;  h/p from 11/8 in file drawer/dl  . CATARACT EXTRACTION    . CESAREAN SECTION     Three C-Sections  . EYE SURGERY     Cataract Surgery, both eyes  . KIDNEY CYST REMOVAL    . MITRAL VALVE REPAIR  June 2010  . TEE WITHOUT CARDIOVERSION  04/29/2012   Procedure: TRANSESOPHAGEAL ECHOCARDIOGRAM (TEE);  Surgeon: [name], MD;  Location: [facility] ENDOSCOPY;  Service: Cardiovascular;  Laterality: N/A;  . TONSILLECTOMY      Social History   Socioeconomic History  . Marital status: Widowed    Spouse name: Not on file  . Number of children: Not on file  . Years of education: Not on file  . Highest education level: Not on file  Occupational History  . Not on file  Tobacco Use  . Smoking status: Never Smoker  . Smokeless tobacco: Never Used  Vaping Use  . Vaping Use: Never used  Substance and Sexual  Activity  . Alcohol use: No  . Drug use: No  . Sexual activity: Never    Comment: Smoked one cigarette a day as a teen  Other Topics Concern  . Not on file  Social History Narrative   Pt lives in [place] alone.  Homemaker.  Widowed   Social Determinants of Radio broadcast assistant Strain:   . Difficulty of Paying Living Expenses: Not on file  Food Insecurity:   . Worried About Charity fundraiser in the Last Year: Not on file  . Ran Out of Food in the Last Year: Not on file  Transportation Needs:   . Lack of  Transportation (Medical): Not on file  . Lack of Transportation (Non-Medical): Not on file  Physical Activity:   . Days of Exercise per Week: Not on file  . Minutes of Exercise per Session: Not on file  Stress:   . Feeling of Stress : Not on file  Social Connections:   . Frequency of Communication with Friends and Family: Not on file  . Frequency of Social Gatherings with Friends and Family: Not on file  . Attends Religious Services: Not on file  . Active Member of Clubs or Organizations: Not on file  . Attends Archivist Meetings: Not on file  . Marital Status: Not on file    reports that she has never smoked. She has never used smokeless tobacco. She reports that she does not drink alcohol and does not use drugs.  Functional Status Survey:    Family History  Problem Relation Age of Onset  . Heart disease Mother   . Heart attack Father   . Other Sister   . Brain cancer Sister   . Cancer Maternal Grandfather     Health Maintenance  Topic Date Due  . TETANUS/TDAP  Never done  . DEXA SCAN  Never done  . PNA vac Low Risk Adult (1 of 2 - PCV13) Never done  . INFLUENZA VACCINE  02/07/2020  . COVID-19 Vaccine  Completed    Allergies  Allergen Reactions  . Amiodarone     sick  . Amoxicillin Nausea And Vomiting  . Amoxicillin-Pot Clavulanate Nausea And Vomiting  . Aspirin     Unknown... 325 mg and more.  . Ciprofloxacin     Flu symptoms  .  Codeine Nausea And Vomiting  . Diuretic [B[place]u-[place]-Hydran]     Pharmacy record  . Iohexol Hives     Code: HIVES, Desc: **11/22/08 **NO REACTION W/ OMNI 300// PT S/P 3 CT SCANS W/ IV CM AND NO HIVES/MMS**PT STATED HIVES 16 YRS AGO. POSSIBLE IV DYE W/ CT/XRAY. MEDICATED W/ 50MG  OF BENEDRYL PO PRIOR TO SCAN, Onset Date: [id number]   . Lisinopril     cough  . Midazolam Hcl Nausea And Vomiting  . Quinolones Other (See Comments)    Flu like symptoms  . Sotalol     Unknown   . Sulfa Antibiotics Other (See Comments)    Flu like symptoms 'serum sickness'  . Thioxanthenes Other (See Comments)    Flu like symptoms 'serum sickness'  . Valium Nausea And Vomiting  . Vancomycin     unknown  . Versed [Midazolam] Nausea And Vomiting    Outpatient Encounter Medications as of 04/06/2020  Medication Sig  . acetaminophen (TYLENOL) 500 MG tablet Take 500 mg by mouth every 6 (six) hours as needed for mild pain.  . Ascorbic Acid (VITAMIN C) 1000 MG tablet Take 1,000 mg by mouth daily.  . B Complex-Folic Acid (BENFOTIAMINE MULTI-B) CAPS Take 250 mg by mouth.  . brimonidine (ALPHAGAN) 0.2 % ophthalmic solution Place 1 drop into both eyes 2 (two) times daily.  [name] CHIA SEED PO Take 5 mLs by mouth daily.   [name] CRANBERRY EXTRACT PO Take 250 mg by mouth.  . Cyanocobalamin (VITAMIN B-12) 1000 MCG SUBL Place 1,000 mcg under the tongue daily.   . D-Mannose POWD by Does not apply route.  [name] ELIQUIS 5 MG TABS tablet TAKE 1 TABLET BY MOUTH TWICE DAILY.  [name] Flaxseed, Linseed, (FLAX SEEDS PO) Take 1 capsule by mouth daily.  . furosemide (LASIX) 20 MG tablet Take  20 mg by mouth daily. 1 to 2 tablets Special Instruction: Take lasix 1 or 2 tablet orally 3 days per week for excessive edema  . metoprolol tartrate (LOPRESSOR) 25 MG tablet Take 25 mg by mouth 2 (two) times daily. Once a morning  . metoprolol tartrate (LOPRESSOR) 50 MG tablet Take 1 tablet (50 mg total) by mouth 2 (two) times daily. .  . Misc Natural  Products (CURCUMAX PRO PO) Take by mouth.  . Omega-3 Fatty Acids (FISH OIL) 1000 MG CAPS Take 2,000 mg by mouth daily.  [name] OVER THE COUNTER MEDICATION Take 1 each by mouth daily. Hemp heart seeds  . OVER THE COUNTER MEDICATION Take 1,250 mg by mouth daily. "Omega-Q squid supplement  . OVER THE COUNTER MEDICATION 1 tumeric capsle by mouth twice daily  . OVER THE COUNTER MEDICATION 1 Canna Complete supplement by mouth daily  . OVER THE COUNTER MEDICATION CBD Capsules  Take one by mouth daily  . PAPAYA PO Take 1 capsule by mouth daily.  . polyethylene glycol (MIRALAX / GLYCOLAX) packet Take 17 g by mouth daily as needed for mild constipation.  . Probiotic Product (PROBIOTIC DAILY PO) Take 1 capsule by mouth daily. 10 Billi CFU   . Ubiquinol 100 MG CAPS Take 100 mg by mouth.  . [DISCONTINUED] cholecalciferol (VITAMIN D) 1000 UNITS tablet Take 1,000 Units by mouth daily.  . [DISCONTINUED] Coenzyme Q10 150 MG CAPS Take by mouth 2 times daily at 12 noon and 4 pm.  . [DISCONTINUED] LevOCARNitine (L-CARNITINE) 500 MG TABS Take 500 mg by mouth daily.  . [DISCONTINUED] magnesium 30 MG tablet Take 30 mg by mouth daily.   No facility-administered encounter medications on file as of 04/06/2020.    Review of Systems  Constitutional: Positive for malaise/fatigue. Negative for chills and fever.  HENT: Negative for congestion and sore throat.   Respiratory: Negative for cough and shortness of breath.   Cardiovascular: Negative for chest pain, palpitations, orthopnea, leg swelling and PND.  Gastrointestinal: Positive for constipation and nausea. Negative for abdominal pain, blood in stool, diarrhea, melena and vomiting.  Genitourinary: Negative for dysuria.  Musculoskeletal: Positive for falls and joint pain.       Left hip, right knee  Skin: Negative for itching and rash.  Neurological: Positive for dizziness and weakness. Negative for sensory change, focal weakness and loss of consciousness.    Endo/Heme/Allergies: Bruises/bleeds easily.  Psychiatric/Behavioral: Positive for memory loss. Negative for depression and hallucinations. The patient is not nervous/anxious and does not have insomnia.     Vitals:   04/06/20 1657  BP: (!) 146/68  Pulse: 74  Temp: (!) 97.4 F (36.3 C)  SpO2: 94%  Weight: 127 lb 6.4 oz (57.8 kg)  Height: 5\' 2"  (1.575 m)   Body mass index is 23.3 kg/m. Physical Exam Vitals reviewed.  Constitutional:      General: She is not in acute distress.    Appearance: Normal appearance. She is not toxic-appearing.  HENT:     Head: Normocephalic and atraumatic.     Right Ear: External ear normal.     Left Ear: External ear normal.     Nose: Nose normal.     Mouth/Throat:     Pharynx: Oropharynx is clear.  Eyes:     Extraocular Movements: Extraocular movements intact.     Conjunctiva/sclera: Conjunctivae normal.     Pupils: Pupils are equal, round, and reactive to light.  Cardiovascular:     Rate and Rhythm: Rhythm irregular.  Heart sounds: No murmur heard.   Pulmonary:     Effort: Pulmonary effort is normal.     Breath sounds: Normal breath sounds. No wheezing, rhonchi or rales.  Abdominal:     General: Bowel sounds are normal. There is no distension.     Palpations: Abdomen is soft.     Tenderness: There is no abdominal tenderness. There is no guarding or rebound.  Musculoskeletal:        General: Normal range of motion.     Cervical back: Neck supple.     Right lower leg: No edema.     Left lower leg: No edema.     Comments: Tender right knee and left thigh and hip  Lymphadenopathy:     Cervical: No cervical adenopathy.  Skin:    General: Skin is warm and dry.  Neurological:     General: No focal deficit present.     Mental Status: She is alert and oriented to person, place, and time.     Cranial Nerves: No cranial nerve deficit.     Sensory: No sensory deficit.     Motor: Weakness present.     Coordination: Coordination normal.      Gait: Gait normal.     Deep Tendon Reflexes: Reflexes normal.     Comments: But some repetition and asks her son to clarify a few things; uses rollator (but forgets sometimes per nursing notes)  Psychiatric:        Mood and Affect: Mood normal.        Behavior: Behavior normal.     Labs reviewed: Basic Metabolic Panel: No results for input(s): NA, K, CL, CO2, GLUCOSE, BUN, CREATININE, CALCIUM, MG, PHOS in the last 8760 hours. Liver Function Tests: No results for input(s): AST, ALT, ALKPHOS, BILITOT, PROT, ALBUMIN in the last 8760 hours. No results for input(s): LIPASE, AMYLASE in the last 8760 hours. No results for input(s): AMMONIA in the last 8760 hours. CBC: No results for input(s): WBC, NEUTROABS, HGB, HCT, MCV, PLT in the last 8760 hours. Cardiac Enzymes: No results for input(s): CKTOTAL, CKMB, CKMBINDEX, TROPONINI in the last 8760 hours. BNP: Invalid input(s): POCBNP Lab Results  Component Value Date   HGBA1C  12/14/2008    5.2 (NOTE) The ADA recommends the following therapeutic goal for glycemic control related to Hgb A1c measurement: Goal of therapy: <6.5 Hgb A1c  Reference: American Diabetes Association: Clinical Practice Recommendations 2010, Diabetes Care, 2010, 33: (Suppl  1).   Lab Results  Component Value Date   TSH 3.040 01/28/2018   Lab Results  Component Value Date   [id number] 379 05/05/2008   Lab Results  Component Value Date   FOLATE  05/05/2008    >20.0 (NOTE)  Reference Ranges        Deficient:       0.4 - 3.3 ng/mL        Indeterminate:   3.4 - 5.4 ng/mL        Normal:              > 5.4 ng/mL   No results found for: IRON, TIBC, FERRITIN  Imaging and Procedures obtained prior to SNF admission: MM SCREENING BREAST TOMO BILATERAL  Result Date: 12/31/2016 CLINICAL DATA:  Screening. EXAM: 2D DIGITAL SCREENING BILATERAL MAMMOGRAM WITH CAD AND ADJUNCT TOMO COMPARISON:  Previous exam(s). ACR Breast Density Category c: The breast tissue is  heterogeneously dense, which may obscure small masses. FINDINGS: There are no findings suspicious for malignancy. Images were  processed with CAD. IMPRESSION: No mammographic evidence of malignancy. A result letter of this screening mammogram will be mailed directly to the patient. RECOMMENDATION: Screening mammogram in one year. (Code:SM-B-01Y) BI-RADS CATEGORY  1: Negative. Electronically Signed   By: Trude Mcburney M.D.   On: 12/31/2016 14:21    Assessment/Plan 1. Recurrent falls -seems these have been in context of #2 and sometimes forgetting walker -here for PT, OT and level of care assessment--seems AL level thus far  2. Dizziness on standing -this seems related to her afib/atypical atrial flutter, also has glaucoma managed by Dr. Ellie Lunch -does not describe vertigo -reduction of metoprolol did not fix this -monitor closely--may need orthostatics--suspect some role of inadequate hydration to avoid using restroom frequently with her longstanding bladder issues -need current cbc, bmp  3. Persistent atrial fibrillation ([facility]) -and atypical atrial flutter--followed by Dr. Daneen Schick -appears we need to reduce her eliquis to 2.5mg  po bid due to age 58 and wt <60 kg now -cont metoprolol 25mg  in am and 50mg  in pm  4. Normocytic anemia -f/u cbc  5. Colitis -per records, she uses stool bulking agents at home in smoothies to regulate her stools -encourage adequate hydration -as miralax prn for constipation, as well  6. Gastroesophageal reflux disease, unspecified whether esophagitis present -not on active treatment for this, monitor b/c her nausea may actually be reflux in need of medication  7. Mild cognitive impairment with memory loss -notable, await MMSE here and see if there is not improvement off all of the supplements and with regular meals and meds, close attention in rehab and therapy  8. Chronic cystitis -cont D mannose, hydration, might consider topical estrogen three times  a week if has recurrent dysuria  Family/ staff Communication: discussed with pt, her son and the rehab nurse  Labs/tests ordered:  Will order cbc, bmp, decrease eliquis and check orthostatics  Jeremi Losito L. Louis Ivery, D.O. [facility] [address], [place] [address] Cell Phone (Mon-Fri 8am-5pm):  [phone] On Call:  [phone] & follow prompts after 5pm & weekends Office Phone:  [phone] Office Fax:  [phone]

## 2020-04-08 DIAGNOSIS — R300 Dysuria: Secondary | ICD-10-CM | POA: Diagnosis not present

## 2020-04-08 DIAGNOSIS — N3020 Other chronic cystitis without hematuria: Secondary | ICD-10-CM | POA: Diagnosis not present

## 2020-04-08 DIAGNOSIS — R8271 Bacteriuria: Secondary | ICD-10-CM | POA: Diagnosis not present

## 2020-04-13 DIAGNOSIS — R296 Repeated falls: Secondary | ICD-10-CM | POA: Insufficient documentation

## 2020-04-13 DIAGNOSIS — R42 Dizziness and giddiness: Secondary | ICD-10-CM | POA: Insufficient documentation

## 2020-04-13 DIAGNOSIS — N3020 Other chronic cystitis without hematuria: Secondary | ICD-10-CM | POA: Insufficient documentation

## 2020-04-13 DIAGNOSIS — G3184 Mild cognitive impairment, so stated: Secondary | ICD-10-CM | POA: Insufficient documentation

## 2020-04-13 MED ORDER — METOPROLOL TARTRATE 50 MG PO TABS
50.0000 mg | ORAL_TABLET | Freq: Every day | ORAL | 3 refills | Status: DC
Start: 2020-04-13 — End: 2020-05-13

## 2020-04-14 DIAGNOSIS — E782 Mixed hyperlipidemia: Secondary | ICD-10-CM | POA: Diagnosis not present

## 2020-04-14 DIAGNOSIS — D6869 Other thrombophilia: Secondary | ICD-10-CM | POA: Diagnosis not present

## 2020-04-14 LAB — BASIC METABOLIC PANEL
BUN: 9 (ref 4–21)
CO2: 25 — AB (ref 13–22)
Chloride: 98 — AB (ref 99–108)
Creatinine: 0.4 — AB (ref 0.5–1.1)
Glucose: 108
Potassium: 4.9 (ref 3.4–5.3)
Sodium: 132 — AB (ref 137–147)

## 2020-04-14 LAB — CBC AND DIFFERENTIAL
HCT: 35 — AB (ref 36–46)
Hemoglobin: 11.7 — AB (ref 12.0–16.0)
Platelets: 110 — AB (ref 150–399)
WBC: 4.4

## 2020-04-14 LAB — COMPREHENSIVE METABOLIC PANEL: Calcium: 9.3 (ref 8.7–10.7)

## 2020-04-14 LAB — CBC: RBC: 3.39 — AB (ref 3.87–5.11)

## 2020-04-15 ENCOUNTER — Encounter: Payer: Self-pay | Admitting: Internal Medicine

## 2020-04-15 DIAGNOSIS — E782 Mixed hyperlipidemia: Secondary | ICD-10-CM | POA: Diagnosis not present

## 2020-04-15 DIAGNOSIS — M6281 Muscle weakness (generalized): Secondary | ICD-10-CM | POA: Diagnosis not present

## 2020-04-15 DIAGNOSIS — H401132 Primary open-angle glaucoma, bilateral, moderate stage: Secondary | ICD-10-CM | POA: Diagnosis not present

## 2020-04-15 DIAGNOSIS — D6869 Other thrombophilia: Secondary | ICD-10-CM | POA: Diagnosis not present

## 2020-04-15 DIAGNOSIS — R278 Other lack of coordination: Secondary | ICD-10-CM | POA: Diagnosis not present

## 2020-04-15 DIAGNOSIS — R296 Repeated falls: Secondary | ICD-10-CM | POA: Diagnosis not present

## 2020-04-15 DIAGNOSIS — G9009 Other idiopathic peripheral autonomic neuropathy: Secondary | ICD-10-CM | POA: Diagnosis not present

## 2020-04-19 DIAGNOSIS — R278 Other lack of coordination: Secondary | ICD-10-CM | POA: Diagnosis not present

## 2020-04-19 DIAGNOSIS — G9009 Other idiopathic peripheral autonomic neuropathy: Secondary | ICD-10-CM | POA: Diagnosis not present

## 2020-04-19 DIAGNOSIS — M6281 Muscle weakness (generalized): Secondary | ICD-10-CM | POA: Diagnosis not present

## 2020-04-19 DIAGNOSIS — R296 Repeated falls: Secondary | ICD-10-CM | POA: Diagnosis not present

## 2020-04-22 DIAGNOSIS — G9009 Other idiopathic peripheral autonomic neuropathy: Secondary | ICD-10-CM | POA: Diagnosis not present

## 2020-04-22 DIAGNOSIS — M6281 Muscle weakness (generalized): Secondary | ICD-10-CM | POA: Diagnosis not present

## 2020-04-22 DIAGNOSIS — R278 Other lack of coordination: Secondary | ICD-10-CM | POA: Diagnosis not present

## 2020-04-22 DIAGNOSIS — R296 Repeated falls: Secondary | ICD-10-CM | POA: Diagnosis not present

## 2020-04-26 ENCOUNTER — Encounter: Payer: Self-pay | Admitting: Internal Medicine

## 2020-04-27 DIAGNOSIS — R296 Repeated falls: Secondary | ICD-10-CM | POA: Diagnosis not present

## 2020-04-27 DIAGNOSIS — G9009 Other idiopathic peripheral autonomic neuropathy: Secondary | ICD-10-CM | POA: Diagnosis not present

## 2020-04-27 DIAGNOSIS — M6281 Muscle weakness (generalized): Secondary | ICD-10-CM | POA: Diagnosis not present

## 2020-04-27 DIAGNOSIS — R278 Other lack of coordination: Secondary | ICD-10-CM | POA: Diagnosis not present

## 2020-04-28 DIAGNOSIS — R278 Other lack of coordination: Secondary | ICD-10-CM | POA: Diagnosis not present

## 2020-04-28 DIAGNOSIS — M6281 Muscle weakness (generalized): Secondary | ICD-10-CM | POA: Diagnosis not present

## 2020-04-28 DIAGNOSIS — G9009 Other idiopathic peripheral autonomic neuropathy: Secondary | ICD-10-CM | POA: Diagnosis not present

## 2020-04-28 DIAGNOSIS — R296 Repeated falls: Secondary | ICD-10-CM | POA: Diagnosis not present

## 2020-04-29 DIAGNOSIS — M6281 Muscle weakness (generalized): Secondary | ICD-10-CM | POA: Diagnosis not present

## 2020-04-29 DIAGNOSIS — G9009 Other idiopathic peripheral autonomic neuropathy: Secondary | ICD-10-CM | POA: Diagnosis not present

## 2020-04-29 DIAGNOSIS — R296 Repeated falls: Secondary | ICD-10-CM | POA: Diagnosis not present

## 2020-04-29 DIAGNOSIS — R278 Other lack of coordination: Secondary | ICD-10-CM | POA: Diagnosis not present

## 2020-05-02 DIAGNOSIS — M6281 Muscle weakness (generalized): Secondary | ICD-10-CM | POA: Diagnosis not present

## 2020-05-02 DIAGNOSIS — R296 Repeated falls: Secondary | ICD-10-CM | POA: Diagnosis not present

## 2020-05-02 DIAGNOSIS — G9009 Other idiopathic peripheral autonomic neuropathy: Secondary | ICD-10-CM | POA: Diagnosis not present

## 2020-05-02 DIAGNOSIS — R278 Other lack of coordination: Secondary | ICD-10-CM | POA: Diagnosis not present

## 2020-05-04 ENCOUNTER — Encounter (INDEPENDENT_AMBULATORY_CARE_PROVIDER_SITE_OTHER): Payer: Self-pay | Admitting: Otolaryngology

## 2020-05-04 ENCOUNTER — Other Ambulatory Visit: Payer: Self-pay

## 2020-05-04 ENCOUNTER — Ambulatory Visit (INDEPENDENT_AMBULATORY_CARE_PROVIDER_SITE_OTHER): Payer: Medicare Other | Admitting: Otolaryngology

## 2020-05-04 VITALS — Temp 96.4°F

## 2020-05-04 DIAGNOSIS — H60312 Diffuse otitis externa, left ear: Secondary | ICD-10-CM | POA: Diagnosis not present

## 2020-05-04 DIAGNOSIS — M6281 Muscle weakness (generalized): Secondary | ICD-10-CM | POA: Diagnosis not present

## 2020-05-04 DIAGNOSIS — R296 Repeated falls: Secondary | ICD-10-CM | POA: Diagnosis not present

## 2020-05-04 DIAGNOSIS — H6123 Impacted cerumen, bilateral: Secondary | ICD-10-CM

## 2020-05-04 DIAGNOSIS — R278 Other lack of coordination: Secondary | ICD-10-CM | POA: Diagnosis not present

## 2020-05-04 DIAGNOSIS — G9009 Other idiopathic peripheral autonomic neuropathy: Secondary | ICD-10-CM | POA: Diagnosis not present

## 2020-05-04 NOTE — Progress Notes (Signed)
HPI: [name] is a 86 y.o. female who presents for evaluation of wax buildup and external otitis.  She was last seen 2-1/2 months ago where she had a left external otitis and gentian violet and Ciprodex was applied.  Her right ear used to be her bad ear but the right ear is doing better.  The left ear is obstructed..  Past Medical History:  Diagnosis Date  . Anxiety   . Arthritis    Some in neck and back, but "can't complain"  . Atypical atrial flutter ([place])   . Breast cancer ([place])   . Cancer [facility])    1998 Right breast cancer with lumpectomy and radiation therapy  . Chronic diastolic CHF (congestive heart failure) ([place])   . Complication of anesthesia    Nausea from versed  . GERD (gastroesophageal reflux disease)   . Glaucoma   . Hiatal hernia   . History of mitral valve repair    Mitral valve prolapse, surgery in 2010  . Hyponatremia   . Normocytic anemia 03/21/2014  . Persistent atrial fibrillation ([place])    s/p MAZE 2010  . Personal history of radiation therapy   . Rheumatic fever    age 13  . Thrombocytopenia ([place])    Past Surgical History:  Procedure Laterality Date  . ABDOMINAL HYSTERECTOMY    . APPENDECTOMY    . BACK SURGERY     Synovial cyst on spine, removed laproscopically in 2000?  [name] BREAST BIOPSY    . BREAST LUMPECTOMY     right 1998  . BREAST SURGERY     1998 Lumpectomy in right breast  . CARDIAC CATHETERIZATION    . CARDIOVERSION  04/29/2012   Procedure: CARDIOVERSION;  Surgeon: [name], MD;  Location: [facility] ENDOSCOPY;  Service: Cardiovascular;  Laterality: N/A;  . CARDIOVERSION  06/19/2012   Procedure: CARDIOVERSION;  Surgeon: Sinclair Grooms, MD;  Location: [facility] ENDOSCOPY;  Service: Cardiovascular;  Laterality: N/A;  h/p from 11/8 in file drawer/dl  . CATARACT EXTRACTION    . CESAREAN SECTION     Three C-Sections  . EYE SURGERY     Cataract Surgery, both eyes  . KIDNEY CYST REMOVAL    . MITRAL VALVE REPAIR  June 2010  . TEE WITHOUT  CARDIOVERSION  04/29/2012   Procedure: TRANSESOPHAGEAL ECHOCARDIOGRAM (TEE);  Surgeon: [name], MD;  Location: [facility] ENDOSCOPY;  Service: Cardiovascular;  Laterality: N/A;  . TONSILLECTOMY     Social History   Socioeconomic History  . Marital status: Widowed    Spouse name: Not on file  . Number of children: Not on file  . Years of education: Not on file  . Highest education level: Not on file  Occupational History  . Not on file  Tobacco Use  . Smoking status: Never Smoker  . Smokeless tobacco: Never Used  Vaping Use  . Vaping Use: Never used  Substance and Sexual Activity  . Alcohol use: No  . Drug use: No  . Sexual activity: Never    Comment: Smoked one cigarette a day as a teen  Other Topics Concern  . Not on file  Social History Narrative   Pt lives in [place] alone.  Homemaker.  Widowed   Social Determinants of Radio broadcast assistant Strain:   . Difficulty of Paying Living Expenses: Not on file  Food Insecurity:   . Worried About Charity fundraiser in the Last Year: Not on file  . Ran Out of  Food in the Last Year: Not on file  Transportation Needs:   . Lack of Transportation (Medical): Not on file  . Lack of Transportation (Non-Medical): Not on file  Physical Activity:   . Days of Exercise per Week: Not on file  . Minutes of Exercise per Session: Not on file  Stress:   . Feeling of Stress : Not on file  Social Connections:   . Frequency of Communication with Friends and Family: Not on file  . Frequency of Social Gatherings with Friends and Family: Not on file  . Attends Religious Services: Not on file  . Active Member of Clubs or Organizations: Not on file  . Attends Archivist Meetings: Not on file  . Marital Status: Not on file   Family History  Problem Relation Age of Onset  . Heart disease Mother   . Heart attack Father   . Other Sister   . Brain cancer Sister   . Cancer Maternal Grandfather    Allergies  Allergen Reactions   . Amiodarone     sick  . Amoxicillin Nausea And Vomiting  . Amoxicillin-Pot Clavulanate Nausea And Vomiting  . Aspirin     Unknown... 325 mg and more.  . Ciprofloxacin     Flu symptoms  . Codeine Nausea And Vomiting  . Diuretic [Buchu-Cornsilk-Ch Grass-Hydran]     Pharmacy record  . Iohexol Hives     Code: HIVES, Desc: **11/22/08 **NO REACTION W/ OMNI 300// PT S/P 3 CT SCANS W/ IV CM AND NO HIVES/MMS**PT STATED HIVES 16 YRS AGO. POSSIBLE IV DYE W/ CT/XRAY. MEDICATED W/ 50MG  OF BENEDRYL PO PRIOR TO SCAN, Onset Date: [id number]   . Lisinopril     cough  . Midazolam Hcl Nausea And Vomiting  . Quinolones Other (See Comments)    Flu like symptoms  . Sotalol     Unknown   . Sulfa Antibiotics Other (See Comments)    Flu like symptoms 'serum sickness'  . Thioxanthenes Other (See Comments)    Flu like symptoms 'serum sickness'  . Valium Nausea And Vomiting  . Vancomycin     unknown  . Versed [Midazolam] Nausea And Vomiting   Prior to Admission medications   Medication Sig Start Date End Date Taking? Authorizing Provider  acetaminophen (TYLENOL) 500 MG tablet Take 500 mg by mouth every 6 (six) hours as needed for mild pain.   Yes [provider]  Ascorbic Acid (VITAMIN C) 1000 MG tablet Take 1,000 mg by mouth daily.   Yes [provider]  B Complex-Folic Acid (BENFOTIAMINE MULTI-B) CAPS Take 250 mg by mouth.   Yes [provider]  CHIA SEED PO Take 5 mLs by mouth daily.    Yes [provider]  Cyanocobalamin (VITAMIN B-12) 1000 MCG SUBL Place 1,000 mcg under the tongue daily.    Yes [provider]  D-Mannose POWD by Does not apply route.   Yes [provider]  ELIQUIS 5 MG TABS tablet TAKE 1 TABLET BY MOUTH TWICE DAILY. 03/17/20  Yes Belva Crome, MD  Flaxseed, Linseed, (FLAX SEEDS PO) Take 1 capsule by mouth daily.   Yes [provider]  furosemide (LASIX) 20 MG tablet Take 20 mg by mouth daily. 1 to 2 tablets Special  Instruction: Take lasix 1 or 2 tablet orally 3 days per week for excessive edema   Yes [provider]  metoprolol tartrate (LOPRESSOR) 25 MG tablet Take 25 mg by mouth in the morning.  Yes [provider]  metoprolol tartrate (LOPRESSOR) 50 MG tablet Take 1 tablet (50 mg total) by mouth daily. . 04/13/20  Yes Reed, Tiffany L, DO  Omega-3 Fatty Acids (FISH OIL) 1000 MG CAPS Take 2,000 mg by mouth daily.   Yes [provider]  OVER THE COUNTER MEDICATION Take 1 each by mouth daily. Hemp heart seeds   Yes [provider]  OVER THE COUNTER MEDICATION CBD Capsules  Take one by mouth daily   Yes [provider]  polyethylene glycol (MIRALAX / GLYCOLAX) packet Take 17 g by mouth daily as needed for mild constipation.   Yes [provider]  Probiotic Product (PROBIOTIC DAILY PO) Take 1 capsule by mouth daily. 10 Billi CFU    Yes [provider]  Ubiquinol 100 MG CAPS Take 100 mg by mouth.   Yes [provider]     Positive ROS: Otherwise negative  All other systems have been reviewed and were otherwise negative with the exception of those mentioned in the HPI and as above.  Physical Exam: Constitutional: Alert, well-appearing, no acute distress Ears: External ears without lesions or tenderness. Ear canals left ear canal is completely obstructed with white debris as well as some old gentian violet.  Ear canal was reasonably good size has able to suction and clean this out with hydroperoxide and suction.  The TM is intact and clear.  After cleaning the ear canal I applied gentian violet, Ciprodex and CSF powder to the left ear.  Right ear canal had minimal crusting in the lateral portion of the ear canal that was cleaned with a curette.  The right ear canal and TM are otherwise clear.. Nasal: External nose without lesions. Clear nasal passages Oral: Oropharynx clear. Neck: No palpable adenopathy or masses Respiratory: Breathing  comfortably  Skin: No facial/neck lesions or rash noted.  Cerumen impaction removal  Date/Time: 05/04/2020 11:21 AM Performed by: Rozetta Nunnery, MD Authorized by: Rozetta Nunnery, MD   Consent:    Consent obtained:  Verbal   Consent given by:  Patient   Risks discussed:  Pain and bleeding Procedure details:    Location:  L ear and R ear   Procedure type: curette, suction and forceps   Post-procedure details:    Inspection:  TM intact and canal normal   Hearing quality:  Improved   Patient tolerance of procedure:  Tolerated well, no immediate complications Comments:     The left ear canal was completely occluded with white debris that was cleaned with suction and hydroperoxide.  The TM is intact and clear otherwise.  I applied gentian violet, Ciprodex and CSF powder to the left ear canal.  Right ear canal had minimal wax buildup that was cleaned with curette and forceps.  Right TM was clear.    Assessment: Chronic left external otitis with occlusion of the ear canal.2  Plan: This was cleaned in the office and applied gentian violet Ciprodex and CSF powder to the left ear canal.  Recommend keeping it dry and she will follow-up in 2 months for recheck and cleaning.  Radene Journey, MD

## 2020-05-09 DIAGNOSIS — N3020 Other chronic cystitis without hematuria: Secondary | ICD-10-CM | POA: Diagnosis not present

## 2020-05-09 DIAGNOSIS — R8271 Bacteriuria: Secondary | ICD-10-CM | POA: Diagnosis not present

## 2020-05-09 DIAGNOSIS — N3946 Mixed incontinence: Secondary | ICD-10-CM | POA: Diagnosis not present

## 2020-05-10 DIAGNOSIS — R278 Other lack of coordination: Secondary | ICD-10-CM | POA: Diagnosis not present

## 2020-05-10 DIAGNOSIS — R296 Repeated falls: Secondary | ICD-10-CM | POA: Diagnosis not present

## 2020-05-10 DIAGNOSIS — M6281 Muscle weakness (generalized): Secondary | ICD-10-CM | POA: Diagnosis not present

## 2020-05-10 DIAGNOSIS — G9009 Other idiopathic peripheral autonomic neuropathy: Secondary | ICD-10-CM | POA: Diagnosis not present

## 2020-05-11 DIAGNOSIS — M6281 Muscle weakness (generalized): Secondary | ICD-10-CM | POA: Diagnosis not present

## 2020-05-11 DIAGNOSIS — G9009 Other idiopathic peripheral autonomic neuropathy: Secondary | ICD-10-CM | POA: Diagnosis not present

## 2020-05-11 DIAGNOSIS — R296 Repeated falls: Secondary | ICD-10-CM | POA: Diagnosis not present

## 2020-05-11 DIAGNOSIS — R278 Other lack of coordination: Secondary | ICD-10-CM | POA: Diagnosis not present

## 2020-05-13 ENCOUNTER — Telehealth: Payer: Self-pay | Admitting: Interventional Cardiology

## 2020-05-13 ENCOUNTER — Non-Acute Institutional Stay: Payer: Medicare Other | Admitting: Adult Health

## 2020-05-13 ENCOUNTER — Encounter: Payer: Self-pay | Admitting: Adult Health

## 2020-05-13 DIAGNOSIS — W19XXXA Unspecified fall, initial encounter: Secondary | ICD-10-CM

## 2020-05-13 DIAGNOSIS — M545 Low back pain, unspecified: Secondary | ICD-10-CM | POA: Diagnosis not present

## 2020-05-13 DIAGNOSIS — M533 Sacrococcygeal disorders, not elsewhere classified: Secondary | ICD-10-CM

## 2020-05-13 DIAGNOSIS — I4819 Other persistent atrial fibrillation: Secondary | ICD-10-CM

## 2020-05-13 DIAGNOSIS — R42 Dizziness and giddiness: Secondary | ICD-10-CM | POA: Diagnosis not present

## 2020-05-13 MED ORDER — METOPROLOL TARTRATE 25 MG PO TABS: 25.0000 mg | ORAL_TABLET | Freq: Two times a day (BID) | ORAL | 3 refills | Status: DC

## 2020-05-13 NOTE — Addendum Note (Signed)
Addended by: Loren Racer on: 05/13/2020 05:40 PM   Modules accepted: Orders

## 2020-05-13 NOTE — Telephone Encounter (Signed)
Pt c/o Syncope: STAT if syncope occurred within 30 minutes and pt complains of lightheadedness High Priority if episode of passing out, completely, today or in last 24 hours   1. Did you pass out today? no  2. When is the last time you passed out? Yesterday 110/4  3. Has this occurred multiple times? Yes over the last several months   4. Did you have any symptoms prior to passing out? Not that she is aware of, she was walking back from getting her mail when they heard her fall.

## 2020-05-13 NOTE — Telephone Encounter (Signed)
Noticed in chart pt was changed back to Metoprolol 50 BID on 9/15.  Dr. Tamala Julian said to continue with plan to decrease to 25 BID and also get a 30 day monitor.  Called nurse back and made her aware.

## 2020-05-13 NOTE — Telephone Encounter (Signed)
Dr. Tamala Julian reviewed everything and said to have pt decrease Metoprolol Tartrate to 25mg  twice daily.  Spoke with the nurse at [facility] and made her aware of this information.  Advised to call if presyncopal episodes continue.

## 2020-05-13 NOTE — Telephone Encounter (Signed)
[name] with [facility] called tor report that the pt continues to have presyncopal episodes. She says she feels like she is "sinking".... yesterday she walked from the dining room to her mailbox she had another presyncopal episode and fell and hit her head without any noticeable injury.. she had her head looked at by the resident MD.   She is seeing the Nurse Practitioner today.... they are doing orthostatic BP's on her now for the NP... she will fax those results along with her BP and HR readings to Dr. Tamala Julian this morning.   She has been eating and drinking well. She was originally admitted to rehab after she had several falls to be sure that she has been taking her meds properly and eating and drinking well and she has been doing all of these things well with assistance... she was advised by the resident MD to call Dr. Tamala Julian.   I will forward to him and watch for the BP and HR readings they are sending to [place] for his review.

## 2020-05-13 NOTE — Progress Notes (Signed)
Location:  Occupational psychologist of Service:  ALF (13) Provider:   Cindi Carbon, ANP [place] [phone]   Gayland Curry, DO  Patient Care Team: Gayland Curry, DO as PCP - General (Geriatric Medicine) Belva Crome, MD as PCP - Cardiology (Cardiology) Constance Haw, MD as PCP - Electrophysiology (Cardiology)  Extended Emergency Contact Information Primary Emergency Contact: Berneda Rose Address: [address]          [place] Phone: [phone] Mobile Phone: [phone] Relation: Son Secondary Emergency Contact: Ron Parker [place] Phone: [phone] Mobile Phone: [phone] Relation: Son   Goals of care: Advanced Directive information Advanced Directives 04/06/2020  Does Patient Have a Medical Advance Directive? Yes  Type of Paramedic of [place];Out of facility DNR (pink MOST or yellow form);Living will  Does patient want to make changes to medical advance directive? No - Patient declined  Copy of [place] in Chart? Yes - validated most recent copy scanned in chart (See row information)  Would patient like information on creating a medical advance directive? -  Pre-existing out of facility DNR order (yellow form or pink MOST form) Pink MOST/Yellow Form most recent copy in chart - Physician notified to receive inpatient order     Chief Complaint  Patient presents with   Acute Visit    dizziness with fall    HPI:  Pt is a 86 y.o. female seen today for an acute visit for dizziness with fall. Resident repors that she was returning to her room from the lobby and fell in [place] hall. She fell on her buttocks and is complaining of pain to her low back and sacral area. She did not pass out but felt dizzy prior to the fall. She did hit her head on the wall but she reports this was mild. No hematoma, bleeding, one sided  weakness, change in LOC, etc. Staff monitoring neuro checks and vitals as pt is on eliquis for afib. She reports that she has been trying to drink more water. Her BMP was WNL in October. This is an ongoing issue. In Sept Dr. Tamala Julian reduced her metoprolol dose for dizziness but later her HR went up so her dose was readjusted. She is not having any inner ear or resp complaints. She recently saw ENT and they cleaned her ears out. She has glaucoma and is followed by ophthalmology. She states sometimes she sees shadows in the corners of her eyes that are not there. No double vision, hallucinations or blurred vision noted.   Orthostatics this am  Lying 138/78 69 Sitting 148/68 74 Standing 152/70 70  The dizziness was originally reported as with standing in past notes but today she states it can come on at any time and is not associated with position changes in bed or upon standing.   Past Medical History:  Diagnosis Date   Anxiety    Arthritis    Some in neck and back, but "can't complain"   Atypical atrial flutter ([facility])    Breast cancer ([place])    Cancer ([place])    1998 Right breast cancer with lumpectomy and radiation therapy   Chronic diastolic CHF (congestive heart failure) ([facility])    Complication of anesthesia    Nausea from versed   GERD (gastroesophageal reflux disease)    Glaucoma    Hiatal hernia    History of mitral valve repair  Mitral valve prolapse, surgery in 2010   Hyponatremia    Normocytic anemia 03/21/2014   Persistent atrial fibrillation ([place])    s/p MAZE 2010   Personal history of radiation therapy    Rheumatic fever    age 46   Thrombocytopenia ([place])    Past Surgical History:  Procedure Laterality Date   ABDOMINAL HYSTERECTOMY     APPENDECTOMY     BACK SURGERY     Synovial cyst on spine, removed laproscopically in 2000?   BREAST BIOPSY     BREAST LUMPECTOMY     right [place] Lumpectomy in right breast   CARDIAC  CATHETERIZATION     CARDIOVERSION  04/29/2012   Procedure: CARDIOVERSION;  Surgeon: [name], MD;  Location: [place] ENDOSCOPY;  Service: Cardiovascular;  Laterality: N/A;   CARDIOVERSION  06/19/2012   Procedure: CARDIOVERSION;  Surgeon: Sinclair Grooms, MD;  Location: [facility] ENDOSCOPY;  Service: Cardiovascular;  Laterality: N/A;  h/p from 11/8 in file drawer/dl   CATARACT EXTRACTION     CESAREAN SECTION     Three C-Sections   EYE SURGERY     Cataract Surgery, both eyes   KIDNEY CYST REMOVAL     MITRAL VALVE REPAIR  June 2010   TEE WITHOUT CARDIOVERSION  04/29/2012   Procedure: TRANSESOPHAGEAL ECHOCARDIOGRAM (TEE);  Surgeon: [name], MD;  Location: [facility] ENDOSCOPY;  Service: Cardiovascular;  Laterality: N/A;   TONSILLECTOMY      Allergies  Allergen Reactions   Amiodarone     sick   Amoxicillin Nausea And Vomiting   Amoxicillin-Pot Clavulanate Nausea And Vomiting   Aspirin     Unknown... 325 mg and more.   Ciprofloxacin     Flu symptoms   Codeine Nausea And Vomiting   Diuretic [Buchu-Cornsilk-Ch Grass-Hydran]     Pharmacy record   Iohexol Hives     Code: HIVES, Desc: **11/22/08 **NO REACTION W/ OMNI 300// PT S/P 3 CT SCANS W/ IV CM AND NO HIVES/MMS**PT STATED HIVES 16 YRS AGO. POSSIBLE IV DYE W/ CT/XRAY. MEDICATED W/ 50MG  OF BENEDRYL PO PRIOR TO SCAN, Onset Date: [id number]    Lisinopril     cough   Midazolam Hcl Nausea And Vomiting   Quinolones Other (See Comments)    Flu like symptoms   Sotalol     Unknown    Sulfa Antibiotics Other (See Comments)    Flu like symptoms 'serum sickness'   Thioxanthenes Other (See Comments)    Flu like symptoms 'serum sickness'   Valium Nausea And Vomiting   Vancomycin     unknown   Versed [Midazolam] Nausea And Vomiting    Outpatient Encounter Medications as of 05/13/2020  Medication Sig   apixaban (ELIQUIS) 2.5 MG TABS tablet Take by mouth 2 (two) times daily.   estradiol (ESTRACE) 0.1 MG/GM vaginal  cream Place 1 Applicatorful vaginally 2 (two) times a week.   metoprolol tartrate (LOPRESSOR) 25 MG tablet Take 25 mg by mouth in the morning.   metoprolol tartrate (LOPRESSOR) 50 MG tablet Take 1 tablet (50 mg total) by mouth daily. .   NON FORMULARY 1 capsule 2 (two) times daily as needed. Digest gold prn GI upset   Omega-3 Fatty Acids (FISH OIL) 1000 MG CAPS Take 2,000 mg by mouth daily.   ondansetron (ZOFRAN) 4 MG tablet Take 4 mg by mouth every 8 (eight) hours as needed for nausea or vomiting.   OVER THE COUNTER MEDICATION Take  1 each by mouth daily. Hemp heart seeds   OVER THE COUNTER MEDICATION CBD Capsules  Take one by mouth daily   polyethylene glycol (MIRALAX / GLYCOLAX) packet Take 17 g by mouth daily.    Ubiquinol 100 MG CAPS Take 100 mg by mouth.   acetaminophen (TYLENOL) 500 MG tablet Take 500 mg by mouth every 6 (six) hours as needed for mild pain.   Ascorbic Acid (VITAMIN C) 1000 MG tablet Take 750 mg by mouth daily.    B Complex-Folic Acid (BENFOTIAMINE MULTI-B) CAPS Take 250 mg by mouth.   CHIA SEED PO Take 5 mLs by mouth daily.    Cyanocobalamin (VITAMIN B-12) 1000 MCG SUBL Place 500 mcg under the tongue daily.    D-Mannose POWD by Does not apply route.   Flaxseed, Linseed, (FLAX SEEDS PO) Take 1 capsule by mouth daily.   furosemide (LASIX) 20 MG tablet Take 20 mg by mouth daily. 1 to 2 tablets Special Instruction: Take lasix 1 or 2 tablet orally 3 days per week for excessive edema   Probiotic Product (PROBIOTIC DAILY PO) Take 1 capsule by mouth daily. 10 Billi CFU    [DISCONTINUED] ELIQUIS 5 MG TABS tablet TAKE 1 TABLET BY MOUTH TWICE DAILY.   No facility-administered encounter medications on file as of 05/13/2020.    Review of Systems  Constitutional: Negative for activity change, appetite change, chills, diaphoresis, fatigue, fever and unexpected weight change.  HENT: Negative for congestion.   Respiratory: Negative for cough, shortness of breath and  wheezing.   Cardiovascular: Positive for leg swelling. Negative for chest pain and palpitations.  Gastrointestinal: Negative for abdominal distention, abdominal pain, constipation and diarrhea.  Genitourinary: Positive for frequency. Negative for difficulty urinating and dysuria.  Musculoskeletal: Positive for gait problem. Negative for arthralgias, back pain, joint swelling and myalgias.  Neurological: Positive for dizziness. Negative for tremors, seizures, syncope, facial asymmetry, speech difficulty, weakness, light-headedness, numbness and headaches.  Psychiatric/Behavioral: Negative for agitation, behavioral problems and confusion.    Immunization History  Administered Date(s) Administered   Influenza Split 04/28/2012   Moderna SARS-COVID-2 Vaccination 07/19/2019, 08/18/2019   Zoster Recombinat (Shingrix) 03/31/2019   Pertinent  Health Maintenance Due  Topic Date Due   DEXA SCAN  Never done   PNA vac Low Risk Adult (1 of 2 - PCV13) Never done   INFLUENZA VACCINE  02/07/2020   Fall Risk  03/15/2016  Falls in the past year? Yes  Comment [place] Telephone Survey: data to providers prior to load  Number falls in past yr: 1  Comment [place] Telephone Survey Actual Response = 1  Injury with Fall? No   Functional Status Survey:    Vitals:   05/13/20 1224  BP: (!) 148/74  Pulse: 70  SpO2: 98%   There is no height or weight on file to calculate BMI. Physical Exam Vitals and nursing note reviewed.  Constitutional:      General: She is not in acute distress.    Appearance: She is not diaphoretic.  HENT:     Head: Normocephalic and atraumatic.     Nose: No congestion.     Mouth/Throat:     Mouth: Mucous membranes are moist.     Pharynx: Oropharynx is clear. No oropharyngeal exudate or posterior oropharyngeal erythema.  Eyes:     Conjunctiva/sclera: Conjunctivae normal.     Pupils: Pupils are equal, round, and reactive to light.  Neck:     Thyroid: No thyromegaly.      Vascular: No  carotid bruit or JVD.  Cardiovascular:     Rate and Rhythm: Normal rate. Rhythm irregular.     Heart sounds: Normal heart sounds. No murmur heard.   Pulmonary:     Effort: Pulmonary effort is normal. No respiratory distress.     Breath sounds: Normal breath sounds. No stridor.  Abdominal:     General: Bowel sounds are normal.     Palpations: Abdomen is soft.  Musculoskeletal:     Cervical back: No rigidity. No muscular tenderness.     Comments: BLE edema non pitting. Tenderness at the sacral area noted with bruising. Bruises to both lower extremities scattered and right hip. Able to bear weight well with +CMS to BLE  Lymphadenopathy:     Cervical: No cervical adenopathy.  Skin:    General: Skin is warm and dry.  Neurological:     General: No focal deficit present.     Mental Status: She is alert and oriented to person, place, and time. Mental status is at baseline.     Cranial Nerves: No cranial nerve deficit.  Psychiatric:        Mood and Affect: Mood normal.     Labs reviewed: Recent Labs    04/14/20 0000  NA 132*  K 4.9  CL 98*  CO2 25*  BUN 9  CREATININE 0.4*  CALCIUM 9.3   No results for input(s): AST, ALT, ALKPHOS, BILITOT, PROT, ALBUMIN in the last 8760 hours. Recent Labs    04/14/20 0000  WBC 4.4  HGB 11.7*  HCT 35*  PLT 110*   Lab Results  Component Value Date   TSH 3.040 01/28/2018   Lab Results  Component Value Date   HGBA1C  12/14/2008    5.2 (NOTE) The ADA recommends the following therapeutic goal for glycemic control related to Hgb A1c measurement: Goal of therapy: <6.5 Hgb A1c  Reference: American Diabetes Association: Clinical Practice Recommendations 2010, Diabetes Care, 2010, 33: (Suppl  1).   No results found for: CHOL, HDL, LDLCALC, LDLDIRECT, TRIG, CHOLHDL  Significant Diagnostic Results in last 30 days:  No results found.  Assessment/Plan 1. Fall, initial encounter Due to feeling dizzy Continue VS monitoring and  neurochecks  2. Sacral pain With bruising and pain Lumbosacral xray ordered  3. Dizziness Not orthostatic today  Recommend rising slowly, maintaining hydration  F/U with Dr. Mariea Clonts in 2 weeks  Prior to this visit dizziness was associated with standing but now she is feeling this symptom at any time and its not associated with standing or change in position or turning over in bed Possibly due to medications, staff to send info and bp results to cardiology for more input. Complicated case given her afb/rate control     Family/ staff Communication: discussed with the resident and her son  Labs/tests ordered: L/S xray

## 2020-05-15 ENCOUNTER — Emergency Department (HOSPITAL_COMMUNITY): Payer: Medicare Other

## 2020-05-15 ENCOUNTER — Emergency Department (HOSPITAL_COMMUNITY)
Admission: EM | Admit: 2020-05-15 | Discharge: 2020-05-15 | Disposition: A | Discharge status: Home / Self Care | Payer: Medicare Other | Attending: Emergency Medicine | Admitting: Emergency Medicine

## 2020-05-15 ENCOUNTER — Encounter (HOSPITAL_COMMUNITY): Payer: Self-pay | Admitting: *Deleted

## 2020-05-15 ENCOUNTER — Other Ambulatory Visit: Payer: Self-pay

## 2020-05-15 DIAGNOSIS — W19XXXA Unspecified fall, initial encounter: Secondary | ICD-10-CM | POA: Insufficient documentation

## 2020-05-15 DIAGNOSIS — Y9269 Other specified industrial and construction area as the place of occurrence of the external cause: Secondary | ICD-10-CM | POA: Diagnosis not present

## 2020-05-15 DIAGNOSIS — Z043 Encounter for examination and observation following other accident: Secondary | ICD-10-CM | POA: Diagnosis not present

## 2020-05-15 DIAGNOSIS — I5032 Chronic diastolic (congestive) heart failure: Secondary | ICD-10-CM | POA: Insufficient documentation

## 2020-05-15 DIAGNOSIS — Z853 Personal history of malignant neoplasm of breast: Secondary | ICD-10-CM | POA: Diagnosis not present

## 2020-05-15 DIAGNOSIS — R030 Elevated blood-pressure reading, without diagnosis of hypertension: Secondary | ICD-10-CM | POA: Diagnosis not present

## 2020-05-15 DIAGNOSIS — S0990XA Unspecified injury of head, initial encounter: Secondary | ICD-10-CM | POA: Diagnosis not present

## 2020-05-15 DIAGNOSIS — Z7901 Long term (current) use of anticoagulants: Secondary | ICD-10-CM | POA: Insufficient documentation

## 2020-05-15 DIAGNOSIS — G4489 Other headache syndrome: Secondary | ICD-10-CM | POA: Diagnosis not present

## 2020-05-15 DIAGNOSIS — R0902 Hypoxemia: Secondary | ICD-10-CM | POA: Diagnosis not present

## 2020-05-15 NOTE — ED Provider Notes (Signed)
[facility] DEPT Provider Note   CSN: [id number] Arrival date & time: 05/15/20  1325     History Chief Complaint  Patient presents with   Headache   Fall    [name] is a 86 y.o. female.  HPI  Patient presents from nursing facility via EMS with concern for head pain.  Patient does have a history of intermittent headaches, notes that this is somewhat different.  Notably, this headache began about 3 days ago after a fall.  She recalls a fall in his entirety, it seemingly was mechanical, while she was outside, working she fell backwards.  She struck her head, backside.  Patient evaluation at her facility, x-rays demonstrate a coccyx fracture.  She has been ambulatory since the event, has had pain focally in her backside, improved transiently with Tylenol and a doughnut seat.  However, the patient has had persistent headache since that time, generalized, sore, the persistency is unusual.  No new vision changes, weakness in any extremity.  With concern for her use of Eliquis, head trauma, she is here for evaluation.  Past Medical History:  Diagnosis Date   Anxiety    Arthritis    Some in neck and back, but "can't complain"   Atypical atrial flutter ([place])    Breast cancer ([place])    Cancer ([place])    1998 Right breast cancer with lumpectomy and radiation therapy   Chronic diastolic CHF (congestive heart failure) (H[facility])    Complication of anesthesia    Nausea from versed   GERD (gastroesophageal reflux disease)    Glaucoma    Hiatal hernia    History of mitral valve repair    Mitral valve prolapse, surgery in 2010   Hyponatremia    Normocytic anemia 03/21/2014   Persistent atrial fibrillation ([place])    s/p MAZE 2010   Personal history of radiation therapy    Rheumatic fever    age 22   Thrombocytopenia ([place])     Patient Active Problem List   Diagnosis Date Noted   Dizziness on standing 04/13/2020   Mild cognitive impairment  with memory loss 04/13/2020   Recurrent falls 04/13/2020   Chronic cystitis 04/13/2020   Atrial flutter ([place]) 05/16/2017   Mitral valve disease 04/25/2017   Normocytic anemia 03/21/2014   Colitis 03/19/2014   Knee pain, left anterior 01/20/2014   S/P mitral valve repair 05/13/2013   Persistent atrial fibrillation ([place]) 06/19/2012    Class: Acute   Anticoagulation goal of INR 2 to 3 05/01/2012    Class: Acute   SOB (shortness of breath) on exertion 04/28/2012   GERD (gastroesophageal reflux disease)     Past Surgical History:  Procedure Laterality Date   ABDOMINAL HYSTERECTOMY     APPENDECTOMY     BACK SURGERY     Synovial cyst on spine, removed laproscopically in 2000?   BREAST BIOPSY     BREAST LUMPECTOMY     right Jenkins Lumpectomy in right breast   CARDIAC CATHETERIZATION     CARDIOVERSION  04/29/2012   Procedure: CARDIOVERSION;  Surgeon: [name], MD;  Location: [facility];  Service: Cardiovascular;  Laterality: N/A;   CARDIOVERSION  06/19/2012   Procedure: CARDIOVERSION;  Surgeon: Sinclair Grooms, MD;  Location: [facility] ENDOSCOPY;  Service: Cardiovascular;  Laterality: N/A;  h/p from 11/8 in file drawer/dl   CATARACT EXTRACTION     CESAREAN SECTION     Three  C-Sections   EYE SURGERY     Cataract Surgery, both eyes   KIDNEY CYST REMOVAL     MITRAL VALVE REPAIR  June 2010   TEE WITHOUT CARDIOVERSION  04/29/2012   Procedure: TRANSESOPHAGEAL ECHOCARDIOGRAM (TEE);  Surgeon: [name], MD;  Location: [facility] ENDOSCOPY;  Service: Cardiovascular;  Laterality: N/A;   TONSILLECTOMY       OB History   No obstetric history on file.     Family History  Problem Relation Age of Onset   Heart disease Mother    Heart attack Father    Other Sister    Brain cancer Sister    Cancer Maternal Grandfather     Social History   Tobacco Use   Smoking status: Never Smoker   Smokeless tobacco: Never Used  Vaping  Use   Vaping Use: Never used  Substance Use Topics   Alcohol use: No   Drug use: No    Home Medications Prior to Admission medications   Medication Sig Start Date End Date Taking? Authorizing Provider  Acetaminophen (ARTHRITIS PAIN PO) Take 2 capsules by mouth daily. ARTHRITIS D   Yes [provider]  acetaminophen (TYLENOL) 500 MG tablet Take 500 mg by mouth 3 (three) times daily as needed for mild pain or headache.    Yes [provider]  apixaban (ELIQUIS) 2.5 MG TABS tablet Take 2.5 mg by mouth 2 (two) times daily.    Yes [provider]  Augusto Garbe POWD Take 1 Scoop by mouth daily as needed (in smoothies).   Yes [provider]  Coenzyme Q10 (COQ10) 100 MG CAPS Take 100 mg by mouth daily.   Yes [provider]  D-Mannose POWD Take 5 mLs by mouth daily. In glass of juice   Yes [provider]  estradiol (ESTRACE) 0.1 MG/GM vaginal cream Place 1 Applicatorful vaginally 2 (two) times a week.   Yes [provider]  Flaxseed, Linseed, (FLAX SEEDS) POWD Take 2 packets by mouth as needed (in smoothies). 2 spoonfulls   Yes [provider]  metoprolol tartrate (LOPRESSOR) 25 MG tablet Take 1 tablet (25 mg total) by mouth 2 (two) times daily. 05/13/20  Yes Belva Crome, MD  Multiple Vitamins-Minerals (LUTEIN-ZEAXANTHIN PO) Take 1 capsule by mouth daily.   Yes [provider]  NON FORMULARY Take 1 tablet by mouth 2 (two) times daily as needed (GI upset). Digest gold tablet   Yes [provider]  Omega-3 Fatty Acids (FISH OIL) 1000 MG CAPS Take 2,000 mg by mouth daily.   Yes [provider]  ondansetron (ZOFRAN) 4 MG tablet Take 4 mg by mouth every 6 (six) hours as needed for nausea or vomiting.    Yes [provider]  OVER THE COUNTER MEDICATION Take 1 capsule by mouth daily. CBD Capsules   Yes [provider]  polyethylene glycol (MIRALAX / GLYCOLAX) packet Take 17 g by mouth  daily.    Yes [provider]  vitamin B-12 (CYANOCOBALAMIN) 500 MCG tablet Take 500 mcg by mouth daily.   Yes [provider]  vitamin C (ASCORBIC ACID) 250 MG tablet Take 750 mg by mouth daily.   Yes [provider]    Allergies    Amiodarone, Amoxicillin, Amoxicillin-pot clavulanate, Aspirin, Cheese, Ciprofloxacin, Codeine, Dairycare [lactase-lactobacillus], Diuretic [buchu-cornsilk-ch grass-hydran], Iohexol, Lisinopril, Midazolam hcl, Quinolones, Sotalol, Sulfa antibiotics, Thioxanthenes, Valium, Vancomycin, Versed [midazolam], and Vinegar [acetic acid]  Review of Systems   Review of Systems  Constitutional:  Per HPI, otherwise negative  HENT:       Per HPI, otherwise negative  Respiratory:       Per HPI, otherwise negative  Cardiovascular:       Per HPI, otherwise negative  Gastrointestinal: Negative for vomiting.  Endocrine:       Negative aside from HPI  Genitourinary:       Neg aside from HPI   Musculoskeletal:       Per HPI, otherwise negative  Skin: Negative.   Neurological: Positive for headaches. Negative for syncope and weakness.    Physical Exam Updated Vital Signs BP (!) 157/90    Pulse (!) 110    Temp 97.8 F (36.6 C) (Oral)    Resp 16    SpO2 96%   Physical Exam Vitals and nursing note reviewed.  Constitutional:      General: She is not in acute distress.    Appearance: She is well-developed.  HENT:     Head: Normocephalic and atraumatic.  Eyes:     Conjunctiva/sclera: Conjunctivae normal.  Cardiovascular:     Rate and Rhythm: Normal rate and regular rhythm.  Pulmonary:     Effort: Pulmonary effort is normal. No respiratory distress.     Breath sounds: Normal breath sounds. No stridor.  Abdominal:     General: There is no distension.  Musculoskeletal:     Cervical back: Full passive range of motion without pain, normal range of motion and neck supple. No edema, rigidity or crepitus. Spinous process tenderness present.  No pain with movement or muscular tenderness. Normal range of motion.  Skin:    General: Skin is warm and dry.  Neurological:     Mental Status: She is alert and oriented to person, place, and time.     Cranial Nerves: No cranial nerve deficit.     Motor: No weakness, tremor, atrophy, abnormal muscle tone or seizure activity.     Coordination: Coordination normal.     ED Results / Procedures / Treatments   Labs (all labs ordered are listed, but only abnormal results are displayed) Labs Reviewed - No data to display  EKG None  Radiology CT Head Wo Contrast  Result Date: 05/15/2020 CLINICAL DATA:  Fall 3 days ago EXAM: CT HEAD WITHOUT CONTRAST TECHNIQUE: Contiguous axial images were obtained from the base of the skull through the vertex without intravenous contrast. COMPARISON:  None. FINDINGS: Brain: No evidence of acute territorial infarction, hemorrhage, hydrocephalus,extra-axial collection or mass lesion/mass effect. There is dilatation the ventricles and sulci consistent with age-related atrophy. Low-attenuation changes in the deep white matter consistent with small vessel ischemia. There appears to be a probable prior lacunar infarct involving the left insular cortex. Vascular: No hyperdense vessel or unexpected calcification. Skull: The skull is intact. No fracture or focal lesion identified. Sinuses/Orbits: The visualized paranasal sinuses and mastoid air cells are clear. The orbits and globes intact. Other: None Cervical spine: Alignment: There is a mild leftward curvature of the cervical spine. Skull base and vertebrae: Visualized skull base is intact. No atlanto-occipital dissociation. The vertebral body heights are well maintained. No fracture or pathologic osseous lesion seen. Soft tissues and spinal canal: The visualized paraspinal soft tissues are unremarkable. No prevertebral soft tissue swelling is seen. The spinal canal is grossly unremarkable, no large epidural collection or  significant canal narrowing. Disc levels: Cervical spine spondylosis is noted with disc osteophyte complex and uncovertebral osteophytes most notable at C5-C6 and C6-C7 with severe neural foraminal narrowing and mild  central canal stenosis. Upper chest: Biapical scarring is seen. Thoracic inlet is within normal limits. Other: None IMPRESSION: No acute intracranial abnormality. Findings consistent with age related atrophy and chronic small vessel ischemia Probable prior lacunar infarct within the left insular cortex. No acute fracture or malalignment of the spine. Electronically Signed   By: Prudencio Pair M.D.   On: 05/15/2020 15:05   CT Cervical Spine Wo Contrast  Result Date: 05/15/2020 CLINICAL DATA:  Fall 3 days ago EXAM: CT HEAD WITHOUT CONTRAST TECHNIQUE: Contiguous axial images were obtained from the base of the skull through the vertex without intravenous contrast. COMPARISON:  None. FINDINGS: Brain: No evidence of acute territorial infarction, hemorrhage, hydrocephalus,extra-axial collection or mass lesion/mass effect. There is dilatation the ventricles and sulci consistent with age-related atrophy. Low-attenuation changes in the deep white matter consistent with small vessel ischemia. There appears to be a probable prior lacunar infarct involving the left insular cortex. Vascular: No hyperdense vessel or unexpected calcification. Skull: The skull is intact. No fracture or focal lesion identified. Sinuses/Orbits: The visualized paranasal sinuses and mastoid air cells are clear. The orbits and globes intact. Other: None Cervical spine: Alignment: There is a mild leftward curvature of the cervical spine. Skull base and vertebrae: Visualized skull base is intact. No atlanto-occipital dissociation. The vertebral body heights are well maintained. No fracture or pathologic osseous lesion seen. Soft tissues and spinal canal: The visualized paraspinal soft tissues are unremarkable. No prevertebral soft tissue  swelling is seen. The spinal canal is grossly unremarkable, no large epidural collection or significant canal narrowing. Disc levels: Cervical spine spondylosis is noted with disc osteophyte complex and uncovertebral osteophytes most notable at C5-C6 and C6-C7 with severe neural foraminal narrowing and mild central canal stenosis. Upper chest: Biapical scarring is seen. Thoracic inlet is within normal limits. Other: None IMPRESSION: No acute intracranial abnormality. Findings consistent with age related atrophy and chronic small vessel ischemia Probable prior lacunar infarct within the left insular cortex. No acute fracture or malalignment of the spine. Electronically Signed   By: Prudencio Pair M.D.   On: 05/15/2020 15:05    Procedures Procedures (including critical care time)  Medications Ordered in ED Medications - No data to display  ED Course  I have reviewed the triage vital signs and the nursing notes.  Pertinent labs & imaging results that were available during my care of the patient were reviewed by me and considered in my medical decision making (see chart for details).    MDM Rules/Calculators/A&P                          3:13 PM Patient in no distress, hemodynamically unremarkable. I reviewed the patient's CT scans, no evidence for intracranial injury, no evidence for cervical spine fracture.  Given the passage of time since the actual event, absence of focal neuro deficits, these reassuring CT scans, patient is appropriate for discharge with outpatient follow-up as needed.  Final Clinical Impression(s) / ED Diagnoses Final diagnoses:  Fall, initial encounter  Injury of head, initial encounter     Carmin Muskrat, MD 05/15/20 2301

## 2020-05-15 NOTE — Discharge Instructions (Signed)
As discussed, your evaluation today has been largely reassuring.  But, it is important that you monitor your condition carefully, and do not hesitate to return to the ED if you develop new, or concerning changes in your condition. ? ?Otherwise, please follow-up with your physician for appropriate ongoing care. ? ?

## 2020-05-15 NOTE — ED Triage Notes (Addendum)
Pt BIB EMS and coming from [facility].  Pt had a fall a few days ago and was checked out by her facility.  Pt hit the back of head and has some bruising to left arm.  Pt normally has headaches but since the fall, pt's headache is not relieved by tylenol.  Pt on blood thinners.  Pt at baseline mentation.

## 2020-05-17 ENCOUNTER — Encounter: Payer: Self-pay | Admitting: Internal Medicine

## 2020-05-17 ENCOUNTER — Encounter: Payer: Self-pay | Admitting: *Deleted

## 2020-05-17 DIAGNOSIS — R296 Repeated falls: Secondary | ICD-10-CM | POA: Diagnosis not present

## 2020-05-17 DIAGNOSIS — M6281 Muscle weakness (generalized): Secondary | ICD-10-CM | POA: Diagnosis not present

## 2020-05-17 DIAGNOSIS — G9009 Other idiopathic peripheral autonomic neuropathy: Secondary | ICD-10-CM | POA: Diagnosis not present

## 2020-05-17 DIAGNOSIS — R278 Other lack of coordination: Secondary | ICD-10-CM | POA: Diagnosis not present

## 2020-05-17 DIAGNOSIS — R531 Weakness: Secondary | ICD-10-CM | POA: Diagnosis not present

## 2020-05-17 DIAGNOSIS — R42 Dizziness and giddiness: Secondary | ICD-10-CM | POA: Diagnosis not present

## 2020-05-17 NOTE — Progress Notes (Signed)
Patient ID: [name], female   DOB: 09-16-1925, 86 y.o.   MRN: [id number] Patient enrolled for [employer] to ship a 30 day cardiac event monitor to her home.  Letter with instructions mailed to patients address.

## 2020-05-18 ENCOUNTER — Encounter: Payer: Self-pay | Admitting: Internal Medicine

## 2020-05-18 ENCOUNTER — Non-Acute Institutional Stay: Payer: Medicare Other | Admitting: Internal Medicine

## 2020-05-18 DIAGNOSIS — R531 Weakness: Secondary | ICD-10-CM

## 2020-05-18 DIAGNOSIS — R42 Dizziness and giddiness: Secondary | ICD-10-CM

## 2020-05-18 DIAGNOSIS — R296 Repeated falls: Secondary | ICD-10-CM | POA: Diagnosis not present

## 2020-05-18 DIAGNOSIS — I4891 Unspecified atrial fibrillation: Secondary | ICD-10-CM | POA: Diagnosis not present

## 2020-05-18 DIAGNOSIS — Z66 Do not resuscitate: Secondary | ICD-10-CM

## 2020-05-18 NOTE — Progress Notes (Addendum)
Location:   [facility]   Place of Service:   AL Provider:  Aayana Reinertsen L. Mariea Clonts, D.O., C.M.D.  Gayland Curry, DO  Patient Care Team: Gayland Curry, DO as PCP - General (Geriatric Medicine) Belva Crome, MD as PCP - Cardiology (Cardiology) Constance Haw, MD as PCP - Electrophysiology (Cardiology)  Extended Emergency Contact Information Primary Emergency Contact: Berneda Rose Address: [address]          [place], [place] Phone: [phone] Mobile Phone: [phone] Relation: Son Secondary Emergency Contact: Ron Parker [place] Phone: [phone] Mobile Phone: [phone] Relation: Son  Code Status:  DNR Goals of care: Advanced Directive information Advanced Directives 05/15/2020  Does Patient Have a Medical Advance Directive? Yes  Type of Advance Directive Out of facility DNR (pink MOST or yellow form)  Does patient want to make changes to medical advance directive? -  Copy of [place] in Chart? -  Would patient like information on creating a medical advance directive? -  Pre-existing out of facility DNR order (yellow form or pink MOST form) Yellow form placed in chart (order not valid for inpatient use)   Chief Complaint  Patient presents with  . Acute Visit    rapid afib, weakness, malaise, dizziness since after lunch today    HPI:  Pt is a 86 y.o. female seen today for an acute visit for rapid irregular pulse, weakness, malaise, dizziness since after lunch today.  Reports she was feeling ok through her meal.  She had come back to her apt after socializing with her friends and then was putting away laundry.  She decided she was too weak and fatigued to go on and thought she might fall so she laid down in bed.  She notified staff who told her to call her sons and she could not reach them.  Nursing contacted me at 5pm and asked me to check on her.    Pt had been doing better in rehab  with less dizziness and moved into AL.  Was not falling at first but has now fallen 3 times.  While in rehab, metoprolol dose was 25mg  in the am and 50mg  in the pm per my note though the med list in my note was incorrect per CMA.   Prior to her rehab stay, her metoprolol had been reduced to 25mg  po bid and that was not tolerated with symptoms as she has now.   She's also not eating well and has a limited diet with her colitis.  Reports her ankles have swollen after eating some poached salmon that came with fried potatoes that were salted.    Reviewing recent records, she was to be receiving a 30 day heart monitor to wear.  She has an appt with Dr. Tamala Julian 11/22.    Past Medical History:  Diagnosis Date  . Anxiety   . Arthritis    Some in neck and back, but "can't complain"  . Atypical atrial flutter ([place])   . Breast cancer ([place])   . Cancer [facility])    1998 Right breast cancer with lumpectomy and radiation therapy  . Chronic diastolic CHF (congestive heart failure) ([place])   . Complication of anesthesia    Nausea from versed  . GERD (gastroesophageal reflux disease)   . Glaucoma   . Hiatal hernia   . History of mitral valve repair    Mitral valve prolapse, surgery in 2010  . Hyponatremia   .  Normocytic anemia 03/21/2014  . Persistent atrial fibrillation ([place])    s/p MAZE 2010  . Personal history of radiation therapy   . Rheumatic fever    age 21  . Thrombocytopenia ([place])    Past Surgical History:  Procedure Laterality Date  . ABDOMINAL HYSTERECTOMY    . APPENDECTOMY    . BACK SURGERY     Synovial cyst on spine, removed laproscopically in 2000?  [name] BREAST BIOPSY    . BREAST LUMPECTOMY     right 1998  . BREAST SURGERY     1998 Lumpectomy in right breast  . CARDIAC CATHETERIZATION    . CARDIOVERSION  04/29/2012   Procedure: CARDIOVERSION;  Surgeon: [name], MD;  Location: [facility] ENDOSCOPY;  Service: Cardiovascular;  Laterality: N/A;  . CARDIOVERSION  06/19/2012   Procedure:  CARDIOVERSION;  Surgeon: Sinclair Grooms, MD;  Location: [facility] ENDOSCOPY;  Service: Cardiovascular;  Laterality: N/A;  h/p from 11/8 in file drawer/dl  . CATARACT EXTRACTION    . CESAREAN SECTION     Three C-Sections  . EYE SURGERY     Cataract Surgery, both eyes  . KIDNEY CYST REMOVAL    . MITRAL VALVE REPAIR  June 2010  . TEE WITHOUT CARDIOVERSION  04/29/2012   Procedure: TRANSESOPHAGEAL ECHOCARDIOGRAM (TEE);  Surgeon: [name], MD;  Location: [facility] ENDOSCOPY;  Service: Cardiovascular;  Laterality: N/A;  . TONSILLECTOMY      Allergies  Allergen Reactions  . Amiodarone     sick  . Amoxicillin Nausea And Vomiting  . Amoxicillin-Pot Clavulanate Nausea And Vomiting  . Aspirin     Unknown... 325 mg and more.  [name] Cheese     Per MAR  . Ciprofloxacin     Flu symptoms  . Codeine Nausea And Vomiting  . Dairycare [Lactase-Lactobacillus]     PER MAR DAIRY  . Diuretic [Buchu-Cornsilk-Ch Grass-Hydran]     Pharmacy record  . Iohexol Hives     Code: HIVES, Desc: **11/22/08 **NO REACTION W/ OMNI 300// PT S/P 3 CT SCANS W/ IV CM AND NO HIVES/MMS**PT STATED HIVES 16 YRS AGO. POSSIBLE IV DYE W/ CT/XRAY. MEDICATED W/ 50MG  OF BENEDRYL PO PRIOR TO SCAN, Onset Date: [id number]   . Lisinopril     cough  . Midazolam Hcl Nausea And Vomiting  . Quinolones Other (See Comments)    Flu like symptoms  . Sotalol     Unknown   . Sulfa Antibiotics Other (See Comments)    Flu like symptoms 'serum sickness'  . Thioxanthenes Other (See Comments)    Flu like symptoms 'serum sickness'  . Valium Nausea And Vomiting  . Vancomycin     unknown  . Versed [Midazolam] Nausea And Vomiting  . Vinegar [Acetic Acid]     PER MAR    Outpatient Encounter Medications as of 05/18/2020  Medication Sig  . Acetaminophen (ARTHRITIS PAIN PO) Take 2 capsules by mouth daily. ARTHRITIS D  . acetaminophen (TYLENOL) 500 MG tablet Take 500 mg by mouth 3 (three) times daily as needed for mild pain or headache.   [name] apixaban  (ELIQUIS) 2.5 MG TABS tablet Take 2.5 mg by mouth 2 (two) times daily.   Christiane Ha Seed POWD Take 1 Scoop by mouth daily as needed (in smoothies).  . Coenzyme Q10 (COQ10) 100 MG CAPS Take 100 mg by mouth daily.  . D-Mannose POWD Take 5 mLs by mouth daily. In glass of juice  . estradiol (ESTRACE) 0.1 MG/GM vaginal cream Place 1  Applicatorful vaginally 2 (two) times a week.  . Flaxseed, Linseed, (FLAX SEEDS) POWD Take 2 packets by mouth as needed (in smoothies). 2 spoonfulls  . metoprolol tartrate (LOPRESSOR) 25 MG tablet Take 1 tablet (25 mg total) by mouth 2 (two) times daily.  . Multiple Vitamins-Minerals (LUTEIN-ZEAXANTHIN PO) Take 1 capsule by mouth daily.  . NON FORMULARY Take 1 tablet by mouth 2 (two) times daily as needed (GI upset). Digest gold tablet  . Omega-3 Fatty Acids (FISH OIL) 1000 MG CAPS Take 2,000 mg by mouth daily.  . ondansetron (ZOFRAN) 4 MG tablet Take 4 mg by mouth every 6 (six) hours as needed for nausea or vomiting.   [name] OVER THE COUNTER MEDICATION Take 1 capsule by mouth daily. CBD Capsules  . polyethylene glycol (MIRALAX / GLYCOLAX) packet Take 17 g by mouth daily.   . vitamin B-12 (CYANOCOBALAMIN) 500 MCG tablet Take 500 mcg by mouth daily.  . vitamin C (ASCORBIC ACID) 250 MG tablet Take 750 mg by mouth daily.   No facility-administered encounter medications on file as of 05/18/2020.    Review of Systems  Constitutional: Positive for malaise/fatigue. Negative for chills and fever.  Respiratory: Positive for shortness of breath. Negative for cough.   Cardiovascular: Positive for palpitations and leg swelling. Negative for chest pain, orthopnea and PND.  Gastrointestinal: Negative for diarrhea.  Genitourinary: Negative for dysuria.  Musculoskeletal: Positive for falls.  Skin: Negative for itching and rash.  Neurological: Positive for dizziness and weakness. Negative for loss of consciousness and headaches.  Psychiatric/Behavioral: Positive for memory loss.     Immunization History  Administered Date(s) Administered  . Influenza Split 04/28/2012  . Moderna SARS-COVID-2 Vaccination 07/19/2019, 08/18/2019  . Zoster Recombinat (Shingrix) 03/31/2019   Pertinent  Health Maintenance Due  Topic Date Due  . DEXA SCAN  Never done  . PNA vac Low Risk Adult (1 of 2 - PCV13) Never done  . INFLUENZA VACCINE  02/07/2020   Fall Risk  03/15/2016  Falls in the past year? Yes  Comment [facility]i Telephone Survey: data to providers prior to load  Number falls in past yr: 1  Comment Emmi Telephone Survey Actual Response = 1  Injury with Fall? No   Functional Status Survey:    Vitals:   05/18/20 1741  BP: (!) 147/96  Pulse: (!) 115  Resp: 16  Temp: (!) 97.5 F (36.4 C)  SpO2: 97%   There is no height or weight on file to calculate BMI. Physical Exam Vitals reviewed.  Constitutional:      General: She is not in acute distress.    Appearance: She is ill-appearing.     Comments: Pale   HENT:     Head: Normocephalic and atraumatic.  Cardiovascular:     Rate and Rhythm: Tachycardia present. Rhythm irregular.  Pulmonary:     Effort: Pulmonary effort is normal.     Breath sounds: Normal breath sounds. No wheezing, rhonchi or rales.  Musculoskeletal:     Right lower leg: Edema present.     Left lower leg: Edema present.     Comments: 2+ bilaterally at this time  Skin:    General: Skin is warm and dry.     Coloration: Skin is pale.  Neurological:     General: No focal deficit present.     Mental Status: She is alert and oriented to person, place, and time.     Comments: Has some short-term memory loss and confusion  Psychiatric:  Mood and Affect: Mood normal.     Labs reviewed: Recent Labs    04/14/20 0000  NA 132*  K 4.9  CL 98*  CO2 25*  BUN 9  CREATININE 0.4*  CALCIUM 9.3   No results for input(s): AST, ALT, ALKPHOS, BILITOT, PROT, ALBUMIN in the last 8760 hours. Recent Labs    04/14/20 0000  WBC 4.4  HGB 11.7*  HCT  35*  PLT 110*   Lab Results  Component Value Date   TSH 3.040 01/28/2018   Lab Results  Component Value Date   HGBA1C  12/14/2008    5.2 (NOTE) The ADA recommends the following therapeutic goal for glycemic control related to Hgb A1c measurement: Goal of therapy: <6.5 Hgb A1c  Reference: American Diabetes Association: Clinical Practice Recommendations 2010, Diabetes Care, 2010, 33: (Suppl  1).   No results found for: CHOL, HDL, LDLCALC, LDLDIRECT, TRIG, CHOLHDL  Significant Diagnostic Results in last 30 days:  CT Head Wo Contrast  Result Date: 05/15/2020 CLINICAL DATA:  Fall 3 days ago EXAM: CT HEAD WITHOUT CONTRAST TECHNIQUE: Contiguous axial images were obtained from the base of the skull through the vertex without intravenous contrast. COMPARISON:  None. FINDINGS: Brain: No evidence of acute territorial infarction, hemorrhage, hydrocephalus,extra-axial collection or mass lesion/mass effect. There is dilatation the ventricles and sulci consistent with age-related atrophy. Low-attenuation changes in the deep white matter consistent with small vessel ischemia. There appears to be a probable prior lacunar infarct involving the left insular cortex. Vascular: No hyperdense vessel or unexpected calcification. Skull: The skull is intact. No fracture or focal lesion identified. Sinuses/Orbits: The visualized paranasal sinuses and mastoid air cells are clear. The orbits and globes intact. Other: None Cervical spine: Alignment: There is a mild leftward curvature of the cervical spine. Skull base and vertebrae: Visualized skull base is intact. No atlanto-occipital dissociation. The vertebral body heights are well maintained. No fracture or pathologic osseous lesion seen. Soft tissues and spinal canal: The visualized paraspinal soft tissues are unremarkable. No prevertebral soft tissue swelling is seen. The spinal canal is grossly unremarkable, no large epidural collection or significant canal narrowing.  Disc levels: Cervical spine spondylosis is noted with disc osteophyte complex and uncovertebral osteophytes most notable at C5-C6 and C6-C7 with severe neural foraminal narrowing and mild central canal stenosis. Upper chest: Biapical scarring is seen. Thoracic inlet is within normal limits. Other: None IMPRESSION: No acute intracranial abnormality. Findings consistent with age related atrophy and chronic small vessel ischemia Probable prior lacunar infarct within the left insular cortex. No acute fracture or malalignment of the spine. Electronically Signed   By: Prudencio Pair M.D.   On: 05/15/2020 15:05   CT Cervical Spine Wo Contrast  Result Date: 05/15/2020 CLINICAL DATA:  Fall 3 days ago EXAM: CT HEAD WITHOUT CONTRAST TECHNIQUE: Contiguous axial images were obtained from the base of the skull through the vertex without intravenous contrast. COMPARISON:  None. FINDINGS: Brain: No evidence of acute territorial infarction, hemorrhage, hydrocephalus,extra-axial collection or mass lesion/mass effect. There is dilatation the ventricles and sulci consistent with age-related atrophy. Low-attenuation changes in the deep white matter consistent with small vessel ischemia. There appears to be a probable prior lacunar infarct involving the left insular cortex. Vascular: No hyperdense vessel or unexpected calcification. Skull: The skull is intact. No fracture or focal lesion identified. Sinuses/Orbits: The visualized paranasal sinuses and mastoid air cells are clear. The orbits and globes intact. Other: None Cervical spine: Alignment: There is a mild leftward curvature of  the cervical spine. Skull base and vertebrae: Visualized skull base is intact. No atlanto-occipital dissociation. The vertebral body heights are well maintained. No fracture or pathologic osseous lesion seen. Soft tissues and spinal canal: The visualized paraspinal soft tissues are unremarkable. No prevertebral soft tissue swelling is seen. The spinal  canal is grossly unremarkable, no large epidural collection or significant canal narrowing. Disc levels: Cervical spine spondylosis is noted with disc osteophyte complex and uncovertebral osteophytes most notable at C5-C6 and C6-C7 with severe neural foraminal narrowing and mild central canal stenosis. Upper chest: Biapical scarring is seen. Thoracic inlet is within normal limits. Other: None IMPRESSION: No acute intracranial abnormality. Findings consistent with age related atrophy and chronic small vessel ischemia Probable prior lacunar infarct within the left insular cortex. No acute fracture or malalignment of the spine. Electronically Signed   By: Prudencio Pair M.D.   On: 05/15/2020 15:05    Assessment/Plan 1. Atrial fibrillation with RVR (HCC) -recurrent, previously also had flutter per notes -is back on lower dose of lopressor and this is how she felt when it was reduced previously -"weak as a dish rag", pale, dizzy -check EKG stat, give extra lopressor 25mg  now -appears upon review of pulses in matrix ([facility] EMR) that she has tachy brady--ranges from 60s to 110s and overtly symptomatic when over 100) -elevate feet at rest  2. Dizziness -due to #1  3. Recurrent falls -also related to arrhythmias  4. Weakness generalized -had improved in rehab and now worse again with tachycardia  Family/ staff Communication: d/w nurse manager and AL nurse  Labs/tests ordered:  Stat ekg, extra dose of lopressor 25mg  now  cc: Dr. Daneen Schick  Kiera Hussey L. Naylin Burkle, D.O. [facility] [address], [place] [address] Cell Phone (Mon-Fri 8am-5pm):  [phone] On Call:  [phone] & follow prompts after 5pm & weekends Office Phone:  [phone] Office Fax:  [phone]  ekg resulted--personally reviewed in hipaa compliant tigertext:  Atrial flutter at 104bpm.  No acute ischemia or infarct.

## 2020-05-21 ENCOUNTER — Encounter: Payer: Self-pay | Admitting: Internal Medicine

## 2020-05-21 DIAGNOSIS — R0600 Dyspnea, unspecified: Secondary | ICD-10-CM | POA: Diagnosis not present

## 2020-05-21 DIAGNOSIS — R42 Dizziness and giddiness: Secondary | ICD-10-CM | POA: Diagnosis not present

## 2020-05-21 LAB — BASIC METABOLIC PANEL
BUN: 16 (ref 4–21)
CO2: 24 — AB (ref 13–22)
Chloride: 90 — AB (ref 99–108)
Creatinine: 0.6 (ref 0.5–1.1)
Glucose: 116
Potassium: 4.1 (ref 3.4–5.3)
Sodium: 124 — AB (ref 137–147)

## 2020-05-21 LAB — COMPREHENSIVE METABOLIC PANEL: Calcium: 9.2 (ref 8.7–10.7)

## 2020-05-21 LAB — CBC: RBC: 3.24 — AB (ref 3.87–5.11)

## 2020-05-21 LAB — CBC AND DIFFERENTIAL
HCT: 33 — AB (ref 36–46)
Hemoglobin: 11.4 — AB (ref 12.0–16.0)
Platelets: 94 — AB (ref 150–399)
WBC: 7.1

## 2020-05-22 ENCOUNTER — Other Ambulatory Visit: Payer: Self-pay

## 2020-05-22 ENCOUNTER — Telehealth: Payer: Self-pay | Admitting: Nurse Practitioner

## 2020-05-22 ENCOUNTER — Telehealth: Payer: Self-pay | Admitting: Adult Health

## 2020-05-22 ENCOUNTER — Emergency Department (HOSPITAL_COMMUNITY): Payer: Medicare Other

## 2020-05-22 ENCOUNTER — Inpatient Hospital Stay (HOSPITAL_COMMUNITY)
Admission: EM | Admit: 2020-05-22 | Discharge: 2020-05-25 | DRG: 291 | Disposition: A | Discharge status: Home Health | Payer: Medicare Other | Attending: Internal Medicine | Admitting: Internal Medicine

## 2020-05-22 DIAGNOSIS — J9811 Atelectasis: Secondary | ICD-10-CM | POA: Diagnosis not present

## 2020-05-22 DIAGNOSIS — Z880 Allergy status to penicillin: Secondary | ICD-10-CM | POA: Diagnosis not present

## 2020-05-22 DIAGNOSIS — I509 Heart failure, unspecified: Secondary | ICD-10-CM | POA: Diagnosis not present

## 2020-05-22 DIAGNOSIS — I5033 Acute on chronic diastolic (congestive) heart failure: Secondary | ICD-10-CM | POA: Diagnosis not present

## 2020-05-22 DIAGNOSIS — Z882 Allergy status to sulfonamides status: Secondary | ICD-10-CM | POA: Diagnosis not present

## 2020-05-22 DIAGNOSIS — Z7901 Long term (current) use of anticoagulants: Secondary | ICD-10-CM

## 2020-05-22 DIAGNOSIS — I4819 Other persistent atrial fibrillation: Secondary | ICD-10-CM | POA: Diagnosis present

## 2020-05-22 DIAGNOSIS — Z91018 Allergy to other foods: Secondary | ICD-10-CM

## 2020-05-22 DIAGNOSIS — Z881 Allergy status to other antibiotic agents status: Secondary | ICD-10-CM

## 2020-05-22 DIAGNOSIS — Z808 Family history of malignant neoplasm of other organs or systems: Secondary | ICD-10-CM

## 2020-05-22 DIAGNOSIS — R2681 Unsteadiness on feet: Secondary | ICD-10-CM | POA: Diagnosis not present

## 2020-05-22 DIAGNOSIS — R7989 Other specified abnormal findings of blood chemistry: Secondary | ICD-10-CM | POA: Diagnosis not present

## 2020-05-22 DIAGNOSIS — W19XXXA Unspecified fall, initial encounter: Secondary | ICD-10-CM | POA: Diagnosis not present

## 2020-05-22 DIAGNOSIS — Z8249 Family history of ischemic heart disease and other diseases of the circulatory system: Secondary | ICD-10-CM | POA: Diagnosis not present

## 2020-05-22 DIAGNOSIS — Z886 Allergy status to analgesic agent status: Secondary | ICD-10-CM

## 2020-05-22 DIAGNOSIS — Z885 Allergy status to narcotic agent status: Secondary | ICD-10-CM | POA: Diagnosis not present

## 2020-05-22 DIAGNOSIS — Z9111 Patient's noncompliance with dietary regimen: Secondary | ICD-10-CM

## 2020-05-22 DIAGNOSIS — R609 Edema, unspecified: Secondary | ICD-10-CM | POA: Diagnosis not present

## 2020-05-22 DIAGNOSIS — Z20822 Contact with and (suspected) exposure to covid-19: Secondary | ICD-10-CM | POA: Diagnosis present

## 2020-05-22 DIAGNOSIS — Z888 Allergy status to other drugs, medicaments and biological substances status: Secondary | ICD-10-CM

## 2020-05-22 DIAGNOSIS — I959 Hypotension, unspecified: Secondary | ICD-10-CM | POA: Diagnosis not present

## 2020-05-22 DIAGNOSIS — R0602 Shortness of breath: Secondary | ICD-10-CM | POA: Diagnosis not present

## 2020-05-22 DIAGNOSIS — K449 Diaphragmatic hernia without obstruction or gangrene: Secondary | ICD-10-CM | POA: Diagnosis present

## 2020-05-22 DIAGNOSIS — I4892 Unspecified atrial flutter: Secondary | ICD-10-CM | POA: Diagnosis not present

## 2020-05-22 DIAGNOSIS — I5043 Acute on chronic combined systolic (congestive) and diastolic (congestive) heart failure: Principal | ICD-10-CM | POA: Diagnosis present

## 2020-05-22 DIAGNOSIS — Z923 Personal history of irradiation: Secondary | ICD-10-CM | POA: Diagnosis not present

## 2020-05-22 DIAGNOSIS — I5021 Acute systolic (congestive) heart failure: Secondary | ICD-10-CM | POA: Diagnosis not present

## 2020-05-22 DIAGNOSIS — E871 Hypo-osmolality and hyponatremia: Secondary | ICD-10-CM | POA: Diagnosis present

## 2020-05-22 DIAGNOSIS — Z66 Do not resuscitate: Secondary | ICD-10-CM | POA: Diagnosis present

## 2020-05-22 DIAGNOSIS — D519 Vitamin B12 deficiency anemia, unspecified: Secondary | ICD-10-CM | POA: Diagnosis present

## 2020-05-22 DIAGNOSIS — K219 Gastro-esophageal reflux disease without esophagitis: Secondary | ICD-10-CM | POA: Diagnosis present

## 2020-05-22 DIAGNOSIS — Z79899 Other long term (current) drug therapy: Secondary | ICD-10-CM

## 2020-05-22 DIAGNOSIS — Z853 Personal history of malignant neoplasm of breast: Secondary | ICD-10-CM | POA: Diagnosis not present

## 2020-05-22 DIAGNOSIS — J90 Pleural effusion, not elsewhere classified: Secondary | ICD-10-CM | POA: Diagnosis not present

## 2020-05-22 DIAGNOSIS — I517 Cardiomegaly: Secondary | ICD-10-CM | POA: Diagnosis not present

## 2020-05-22 DIAGNOSIS — M1990 Unspecified osteoarthritis, unspecified site: Secondary | ICD-10-CM | POA: Diagnosis present

## 2020-05-22 DIAGNOSIS — D539 Nutritional anemia, unspecified: Secondary | ICD-10-CM | POA: Diagnosis present

## 2020-05-22 DIAGNOSIS — I484 Atypical atrial flutter: Secondary | ICD-10-CM | POA: Diagnosis present

## 2020-05-22 DIAGNOSIS — J189 Pneumonia, unspecified organism: Secondary | ICD-10-CM | POA: Diagnosis present

## 2020-05-22 DIAGNOSIS — I110 Hypertensive heart disease with heart failure: Secondary | ICD-10-CM | POA: Diagnosis not present

## 2020-05-22 DIAGNOSIS — I4891 Unspecified atrial fibrillation: Secondary | ICD-10-CM | POA: Diagnosis not present

## 2020-05-22 DIAGNOSIS — H409 Unspecified glaucoma: Secondary | ICD-10-CM | POA: Diagnosis present

## 2020-05-22 LAB — CBC
HCT: 31.4 % — ABNORMAL LOW (ref 36.0–46.0)
Hemoglobin: 10.3 g/dL — ABNORMAL LOW (ref 12.0–15.0)
MCH: 35.9 pg — ABNORMAL HIGH (ref 26.0–34.0)
MCHC: 32.8 g/dL (ref 30.0–36.0)
MCV: 109.4 fL — ABNORMAL HIGH (ref 80.0–100.0)
Platelets: 76 10*3/uL — ABNORMAL LOW (ref 150–400)
RBC: 2.87 MIL/uL — ABNORMAL LOW (ref 3.87–5.11)
RDW: 15.3 % (ref 11.5–15.5)
WBC: 5.8 10*3/uL (ref 4.0–10.5)
nRBC: 0 % (ref 0.0–0.2)

## 2020-05-22 LAB — BASIC METABOLIC PANEL
Anion gap: 12 (ref 5–15)
BUN: 17 mg/dL (ref 8–23)
CO2: 23 mmol/L (ref 22–32)
Calcium: 8.1 mg/dL — ABNORMAL LOW (ref 8.9–10.3)
Chloride: 95 mmol/L — ABNORMAL LOW (ref 98–111)
Creatinine, Ser: 0.77 mg/dL (ref 0.44–1.00)
GFR, Estimated: >60 mL/min (ref >60)
Glucose, Bld: 105 mg/dL — ABNORMAL HIGH (ref 70–99)
Potassium: 3.6 mmol/L (ref 3.5–5.1)
Sodium: 130 mmol/L — ABNORMAL LOW (ref 135–145)

## 2020-05-22 LAB — RESPIRATORY PANEL BY RT PCR (FLU A&B, COVID)
Influenza A by PCR: NEGATIVE
Influenza B by PCR: NEGATIVE
SARS Coronavirus 2 by RT PCR: NEGATIVE

## 2020-05-22 LAB — PROTIME-INR
INR: 1.7 — ABNORMAL HIGH (ref 0.8–1.2)
Prothrombin Time: 19.4 seconds — ABNORMAL HIGH (ref 11.4–15.2)

## 2020-05-22 LAB — MAGNESIUM: Magnesium: 1.7 mg/dL (ref 1.7–2.4)

## 2020-05-22 LAB — BRAIN NATRIURETIC PEPTIDE: B Natriuretic Peptide: 668.6 pg/mL — ABNORMAL HIGH (ref 0.0–100.0)

## 2020-05-22 MED ORDER — METOPROLOL TARTRATE 25 MG PO TABS
25.0000 mg | ORAL_TABLET | Freq: Every day | ORAL | Status: DC
  Administered 2020-05-22 – 2020-05-24 (×3): 25 mg via ORAL
  Filled 2020-05-22 (×3): qty 1

## 2020-05-22 MED ORDER — VITAMIN B-12 1000 MCG PO TABS
500.0000 ug | ORAL_TABLET | Freq: Every day | ORAL | Status: DC
  Administered 2020-05-23 – 2020-05-25 (×3): 500 ug via ORAL
  Filled 2020-05-22 (×3): qty 1

## 2020-05-22 MED ORDER — METOPROLOL TARTRATE 50 MG PO TABS
50.0000 mg | ORAL_TABLET | Freq: Every morning | ORAL | Status: DC
  Administered 2020-05-23 – 2020-05-25 (×3): 50 mg via ORAL
  Filled 2020-05-22 (×3): qty 1

## 2020-05-22 MED ORDER — POTASSIUM CHLORIDE CRYS ER 20 MEQ PO TBCR
40.0000 meq | EXTENDED_RELEASE_TABLET | Freq: Once | ORAL | Status: AC
  Administered 2020-05-22: 40 meq via ORAL
  Filled 2020-05-22: qty 2

## 2020-05-22 MED ORDER — FUROSEMIDE 10 MG/ML IJ SOLN
40.0000 mg | Freq: Once | INTRAMUSCULAR | Status: AC
  Administered 2020-05-22: 40 mg via INTRAVENOUS
  Filled 2020-05-22: qty 4

## 2020-05-22 MED ORDER — FUROSEMIDE 10 MG/ML IJ SOLN
40.0000 mg | Freq: Two times a day (BID) | INTRAMUSCULAR | Status: DC
  Administered 2020-05-23 – 2020-05-25 (×4): 40 mg via INTRAVENOUS
  Filled 2020-05-22 (×5): qty 4

## 2020-05-22 MED ORDER — APIXABAN 2.5 MG PO TABS
2.5000 mg | ORAL_TABLET | Freq: Two times a day (BID) | ORAL | Status: DC
  Administered 2020-05-22 – 2020-05-25 (×6): 2.5 mg via ORAL
  Filled 2020-05-22 (×6): qty 1

## 2020-05-22 NOTE — ED Notes (Signed)
The pt has been dozing.  She is a little scattered at present

## 2020-05-22 NOTE — ED Triage Notes (Signed)
Pt arrives via EMS from [place]. Staff and pt report weight gain, SOB upon exertion and lower extremity edema X1 week.   Labs drawn yesterday 05/21/20 BMP:1900  NA: 124 Lasix started 05/21/20 40mg /day K+ started 56mEq/day  New onset of Aflutter controlled rate  HR 70 BP 116/70 98% RA 97.4 T RR 16 CBG 116

## 2020-05-22 NOTE — ED Provider Notes (Signed)
[facility] EMERGENCY DEPARTMENT Provider Note   CSN: [id number] Arrival date & time: 05/22/20  1259     History Shortness of breath, leg swelling  [name] is a 86 y.o. female.  HPI   Patient presented to ED for evaluation of increasing shortness of breath as well as leg swelling.  Patient is a resident of [facility].  Patient has had progressive dyspnea on exertion and lower extremity edema for the past week.  Patient has been getting treated with diuretics.  Patient states the staff recommended she come to the ED today because her symptoms are worsening and not getting better with her treatment.  Patient denies any chest pain.  No fevers or chills.  She is not having any orthopnea.  She does feel like she is more unsteady on her feet when she tries to walk around.  She does get short of breath with activity.  She has never noticed problems with leg swelling until recently.  She denies any fevers or chills.  Past Medical History:  Diagnosis Date  . Anxiety   . Arthritis    Some in neck and back, but "can't complain"  . Atypical atrial flutter ([place])   . Breast cancer ([place])   . Cancer [facility])    1998 Right breast cancer with lumpectomy and radiation therapy  . Chronic diastolic CHF (congestive heart failure) ([place])   . Complication of anesthesia    Nausea from versed  . GERD (gastroesophageal reflux disease)   . Glaucoma   . Hiatal hernia   . History of mitral valve repair    Mitral valve prolapse, surgery in 2010  . Hyponatremia   . Normocytic anemia 03/21/2014  . Persistent atrial fibrillation ([place])    s/p MAZE 2010  . Personal history of radiation therapy   . Rheumatic fever    age 9  . Thrombocytopenia [facility])     Patient Active Problem List   Diagnosis Date Noted  . Dizziness on standing 04/13/2020  . Mild cognitive impairment with memory loss 04/13/2020  . Recurrent falls 04/13/2020  . Chronic cystitis 04/13/2020  . Atrial flutter ([place])  05/16/2017  . Mitral valve disease 04/25/2017  . Normocytic anemia 03/21/2014  . Colitis 03/19/2014  . Knee pain, left anterior 01/20/2014  . S/P mitral valve repair 05/13/2013  . Persistent atrial fibrillation ([place]) 06/19/2012    Class: Acute  . SOB (shortness of breath) on exertion 04/28/2012  . GERD (gastroesophageal reflux disease)     Past Surgical History:  Procedure Laterality Date  . ABDOMINAL HYSTERECTOMY    . APPENDECTOMY    . BACK SURGERY     Synovial cyst on spine, removed laproscopically in 2000?  [name] BREAST BIOPSY    . BREAST LUMPECTOMY     right 1998  . BREAST SURGERY     1998 Lumpectomy in right breast  . CARDIAC CATHETERIZATION    . CARDIOVERSION  04/29/2012   Procedure: CARDIOVERSION;  Surgeon: [name], MD;  Location: [facility] ENDOSCOPY;  Service: Cardiovascular;  Laterality: N/A;  . CARDIOVERSION  06/19/2012   Procedure: CARDIOVERSION;  Surgeon: Sinclair Grooms, MD;  Location: [facility] ENDOSCOPY;  Service: Cardiovascular;  Laterality: N/A;  h/p from 11/8 in file drawer/dl  . CATARACT EXTRACTION    . CESAREAN SECTION     Three C-Sections  . EYE SURGERY     Cataract Surgery, both eyes  . KIDNEY CYST REMOVAL    . MITRAL VALVE REPAIR  June 2010  .  TEE WITHOUT CARDIOVERSION  04/29/2012   Procedure: TRANSESOPHAGEAL ECHOCARDIOGRAM (TEE);  Surgeon: [name], MD;  Location: [facility] ENDOSCOPY;  Service: Cardiovascular;  Laterality: N/A;  . TONSILLECTOMY       OB History   No obstetric history on file.     Family History  Problem Relation Age of Onset  . Heart disease Mother   . Heart attack Father   . Other Sister   . Brain cancer Sister   . Cancer Maternal Grandfather     Social History   Tobacco Use  . Smoking status: Never Smoker  . Smokeless tobacco: Never Used  Vaping Use  . Vaping Use: Never used  Substance Use Topics  . Alcohol use: No  . Drug use: No    Home Medications Prior to Admission medications   Medication Sig Start Date End Date  Taking? Authorizing Provider  Acetaminophen (ARTHRITIS PAIN PO) Take 2 capsules by mouth daily. ARTHRITIS D    [provider]  acetaminophen (TYLENOL) 500 MG tablet Take 500 mg by mouth 3 (three) times daily as needed for mild pain or headache.     [provider]  apixaban (ELIQUIS) 2.5 MG TABS tablet Take 2.5 mg by mouth 2 (two) times daily.     [provider]  Augusto Garbe POWD Take 1 Scoop by mouth daily as needed (in smoothies).    [provider]  Coenzyme Q10 (COQ10) 100 MG CAPS Take 100 mg by mouth daily.    [provider]  D-Mannose POWD Take 5 mLs by mouth daily. In glass of juice    [provider]  estradiol (ESTRACE) 0.1 MG/GM vaginal cream Place 1 Applicatorful vaginally 2 (two) times a week.    [provider]  Flaxseed, Linseed, (FLAX SEEDS) POWD Take 2 packets by mouth as needed (in smoothies). 2 spoonfulls    [provider]  metoprolol tartrate (LOPRESSOR) 25 MG tablet Take 1 tablet (25 mg total) by mouth 2 (two) times daily. 05/13/20   Belva Crome, MD  Multiple Vitamins-Minerals (LUTEIN-ZEAXANTHIN PO) Take 1 capsule by mouth daily.    [provider]  NON FORMULARY Take 1 tablet by mouth 2 (two) times daily as needed (GI upset). Digest gold tablet    [provider]  Omega-3 Fatty Acids (FISH OIL) 1000 MG CAPS Take 2,000 mg by mouth daily.    [provider]  ondansetron (ZOFRAN) 4 MG tablet Take 4 mg by mouth every 6 (six) hours as needed for nausea or vomiting.     [provider]  OVER THE COUNTER MEDICATION Take 1 capsule by mouth daily. CBD Capsules    [provider]  polyethylene glycol (MIRALAX / GLYCOLAX) packet Take 17 g by mouth daily.     [provider]  vitamin B-12 (CYANOCOBALAMIN) 500 MCG tablet Take 500 mcg by mouth daily.    [provider]  vitamin C (ASCORBIC ACID) 250 MG tablet Take 750 mg by mouth daily.    [provider]    Allergies    Amiodarone, Amoxicillin, Amoxicillin-pot clavulanate, Aspirin, Cheese, Ciprofloxacin, Codeine, Dairycare [lactase-lactobacillus], Diuretic [buchu-cornsilk-ch grass-hydran], Iohexol, Lisinopril, Midazolam hcl, Quinolones, Sotalol, Sulfa antibiotics, Thioxanthenes, Valium, Vancomycin, Versed [midazolam], and Vinegar [acetic acid]  Review of Systems   Review of Systems  All other systems reviewed and are negative.   Physical Exam Updated Vital Signs BP 132/61   Pulse (!) 137   Temp 97.8 F (36.6 C) (Oral)   Resp [name])  23   Ht 1.575 m (5\' 2" )   Wt 57.4 kg   SpO2 96%   BMI 23.16 kg/m   Physical Exam Vitals and nursing note reviewed.  Constitutional:      General: She is in acute distress.     Appearance: She is well-developed. She is not ill-appearing or diaphoretic.  HENT:     Head: Normocephalic and atraumatic.     Right Ear: External ear normal.     Left Ear: External ear normal.  Eyes:     General: No scleral icterus.       Right eye: No discharge.        Left eye: No discharge.     Conjunctiva/sclera: Conjunctivae normal.  Neck:     Trachea: No tracheal deviation.  Cardiovascular:     Rate and Rhythm: Normal rate and regular rhythm.  Pulmonary:     Effort: Pulmonary effort is normal. No respiratory distress.     Breath sounds: No stridor. Rales present. No wheezing.  Abdominal:     General: Bowel sounds are normal. There is no distension.     Palpations: Abdomen is soft.     Tenderness: There is no abdominal tenderness. There is no guarding or rebound.  Musculoskeletal:        General: No tenderness.     Cervical back: Neck supple.     Right lower leg: Edema present.     Left lower leg: Edema present.     Comments: Pitting edema bilateral lower extremities  Skin:    General: Skin is warm and dry.     Findings: No rash.  Neurological:     Mental Status: She is alert.     Cranial Nerves: No cranial nerve deficit (no facial droop,  extraocular movements intact, no slurred speech).     Sensory: No sensory deficit.     Motor: No abnormal muscle tone or seizure activity.     Coordination: Coordination normal.     ED Results / Procedures / Treatments   Labs (all labs ordered are listed, but only abnormal results are displayed) Labs Reviewed  BASIC METABOLIC PANEL - Abnormal; Notable for the following components:      Result Value   Sodium 130 (*)    Chloride 95 (*)    Glucose, Bld 105 (*)    Calcium 8.1 (*)    All other components within normal limits  CBC - Abnormal; Notable for the following components:   RBC 2.87 (*)    Hemoglobin 10.3 (*)    HCT 31.4 (*)    MCV 109.4 (*)    MCH 35.9 (*)    All other components within normal limits  PROTIME-INR - Abnormal; Notable for the following components:   Prothrombin Time 19.4 (*)    INR 1.7 (*)    All other components within normal limits  RESPIRATORY PANEL BY RT PCR (FLU A&B, COVID)  MAGNESIUM  BRAIN NATRIURETIC PEPTIDE    EKG EKG Interpretation  Date/Time:  Sunday May 22 2020 13:47:31 EST Ventricular Rate:  77 PR Interval:    QRS Duration: 90 QT Interval:  474 QTC Calculation: 547 R Axis:   122 Text Interpretation: Atrial flutter Ventricular premature complex Probable right ventricular hypertrophy Lateral infarct, old Prolonged QT interval atrial flutter has replaced atrial fibrillation since last tracing Confirmed by Dorie Rank [phone]) on 05/22/2020 1:52:23 PM   Radiology DG Chest Portable 1 View  Result Date: 05/22/2020 CLINICAL DATA:  Shortness of breath with lower  extremity edema EXAM: PORTABLE CHEST 1 VIEW COMPARISON:  Nov 17, 2016 FINDINGS: There is a small left pleural effusion with airspace opacity in the left base. There is subtle ill-defined opacity in the right mid lung. Lungs elsewhere are clear. There is cardiomegaly with pulmonary vascularity normal. No adenopathy. There is aortic atherosclerosis. No bone lesions. There is mitral  annulus calcification. IMPRESSION: Stable cardiomegaly. Left pleural effusion with left lower lobe consolidation, likely due to combination of atelectasis and pneumonia. Subtle ill-defined opacity right mid lung likely a second focus of pneumonia. Aortic Atherosclerosis (ICD10-I70.0). Electronically Signed   By: Lowella Grip III M.D.   On: 05/22/2020 14:22    Procedures Procedures (including critical care time)  Medications Ordered in ED Medications  furosemide (LASIX) injection 40 mg (has no administration in time range)  potassium chloride SA (KLOR-CON) CR tablet 40 mEq (has no administration in time range)    ED Course  I have reviewed the triage vital signs and the nursing notes.  Pertinent labs & imaging results that were available during my care of the patient were reviewed by me and considered in my medical decision making (see chart for details).  Clinical Course as of May 22 1550  Sun May 22, 2020  1542 CXR shows pleural effusion.  ?pna   [JK]  1546 Heart rate is 80 at the bedside   [JK]    Clinical Course User Index [JK] Dorie Rank, MD   MDM Rules/Calculators/A&P                          Patient presented to ED for evaluation of worsening shortness of breath. Patient does have history of CHF as well as atrial fibrillation/atrial flutter. Patient is rate controlled in the ED. She has mild tachypnea. Laboratory tests are notable for mild hyponatremia and mild anemia. Anemia and hyponatremia not significantly changed from previous values. Chest x-ray shows the possibility of pneumonia although I think her clinical scenario is more consistent with CHF. Patient has not responded to diuretics on an outpatient basis. Think is reasonable to bring her in for IV diuretics consider an echocardiogram. I will consult the medical service for further treatment Final Clinical Impression(s) / ED Diagnoses Final diagnoses:  Acute on chronic congestive heart failure, unspecified heart  failure type [facility])      Dorie Rank, MD 05/22/20 1554

## 2020-05-22 NOTE — Telephone Encounter (Signed)
Nurse called from [facility] to report that labs have returned on this resident NA 124 with BNP over 1000.  She has edema and DOE which is progressively worse. Lasix increased one day ago with no improvement. Pt keeps falling also. Afib has been difficult to control. She takes metoprolol and has dizziness.  When the dose is reduced due to possible s/e her HR increases. Now appears to have volume overload with hyponatremia. CXR showed bilateral infiltrates which could be due to CHF.  She has no fever or cough. Due to worsening symptoms and difficult to control afib recommend sending the patient for further eval in the ER.

## 2020-05-22 NOTE — Telephone Encounter (Signed)
While on call 05/21/20  Nursing called to report pt with worsening shortness of breath and LE edema (2-3+)  HR noted to be ranging from 100-120 reports recent does reduction to lopressor (due to dizziness) decreased from 50 mg in the am and 25 mg in PM to 25 mg BID. No fever or chills noted. Blood pressure 153/86.  Due to persistent tachycardia and elevated BPWill increase lopressor to 50 mg in the am and continue 25 mg in the PM.  Lasix 40 mg daily with 20 meq KCL for 3 days then to stop.  VS q shift.  To obtain BNP, BMP, CBC with diff and chest xray.  Sent to PCP Mariea Clonts and [place], NP) for follow up when in facility. Also pending cardiology follow up per nursing staff.

## 2020-05-22 NOTE — ED Notes (Signed)
ED Provider at bedside. 

## 2020-05-22 NOTE — ED Notes (Signed)
Unsuccessful attempt to give report  To call back

## 2020-05-22 NOTE — H&P (Addendum)
History and Physical  [name] XBW:[id number] DOB: 09-12-1925 DOA: 05/22/2020  Referring physician: Dorie Rank, MD PCP: Gayland Curry, DO  Patient coming from: [facility]  Chief Complaint: Increased shortness of breath and leg swelling  HPI: [name] is a 86 y.o. female with medical history significant for A. fib on Eliquis who presents to the emergency department due to 1 week onset of increasing shortness of breath on exertion and increased leg swelling.  She was being treated with diuretics at the nursing facility, but despite this, symptoms continued to worsen, so [facility] staff activated EMS for patient to be taken to the ED for further evaluation and management.  She endorsed 2 falls since 11/5 and she presented to the ED on 11/7 during which CT scan of head done at that time showed no intracranial injury and no evidence for cervical spine fracture.  She denies chest pain, fever, chills, nausea, vomiting or abdominal pain.  ED Course:  In the emergency department, she was tachypneic, but was hemodynamically stable.  Work-up in the ED showed normal CBC and BMP except for macrocytic anemia, hyponatremia.  BNP 668.6 (this was 1067 from work-up done at nursing facility per medical record from [facility]).  Chest x-ray showed stable cardiomegaly with left pleural effusion with left lower lobe consolidation, likely due to combination of atelectasis and pneumonia.  IV Lasix 40 Mg x1 and KCl was given.  Hospitalist was asked to admit patient for further evaluation and management.  Review of Systems: Constitutional: Negative for chills and fever.  HENT: Negative for ear pain and sore throat.   Eyes: Negative for pain and visual disturbance.  Respiratory: Positive for shortness of breath.  Negative for cough, chest tightness  Cardiovascular: Negative for chest pain and palpitations.  Gastrointestinal: Negative for abdominal pain and vomiting.  Endocrine: Negative for polyphagia and polyuria.    Genitourinary: Negative for decreased urine volume, dysuria, enuresis Musculoskeletal: Negative for arthralgias and back pain.  Skin: Negative for color change and rash.  Allergic/Immunologic: Negative for immunocompromised state.  Neurological: Negative for tremors, syncope, speech difficulty, weakness, light-headedness and headaches.  Hematological: Does not bruise/bleed easily.  All other systems reviewed and are negative   Past Medical History:  Diagnosis Date  . Anxiety   . Arthritis    Some in neck and back, but "can't complain"  . Atypical atrial flutter ([place])   . Breast cancer ([place])   . Cancer [facility])    1998 Right breast cancer with lumpectomy and radiation therapy  . Chronic diastolic CHF (congestive heart failure) ([place])   . Complication of anesthesia    Nausea from versed  . GERD (gastroesophageal reflux disease)   . Glaucoma   . Hiatal hernia   . History of mitral valve repair    Mitral valve prolapse, surgery in 2010  . Hyponatremia   . Normocytic anemia 03/21/2014  . Persistent atrial fibrillation ([place])    s/p MAZE 2010  . Personal history of radiation therapy   . Rheumatic fever    age 36  . Thrombocytopenia ([place])    Past Surgical History:  Procedure Laterality Date  . ABDOMINAL HYSTERECTOMY    . APPENDECTOMY    . BACK SURGERY     Synovial cyst on spine, removed laproscopically in 2000?  [name] BREAST BIOPSY    . BREAST LUMPECTOMY     right 1998  . BREAST SURGERY     1998 Lumpectomy in right breast  . CARDIAC CATHETERIZATION    .  CARDIOVERSION  04/29/2012   Procedure: CARDIOVERSION;  Surgeon: [name], MD;  Location: [facility] ENDOSCOPY;  Service: Cardiovascular;  Laterality: N/A;  . CARDIOVERSION  06/19/2012   Procedure: CARDIOVERSION;  Surgeon: Sinclair Grooms, MD;  Location: [facility] ENDOSCOPY;  Service: Cardiovascular;  Laterality: N/A;  h/p from 11/8 in file drawer/dl  . CATARACT EXTRACTION    . CESAREAN SECTION     Three C-Sections  . EYE SURGERY      Cataract Surgery, both eyes  . KIDNEY CYST REMOVAL    . MITRAL VALVE REPAIR  June 2010  . TEE WITHOUT CARDIOVERSION  04/29/2012   Procedure: TRANSESOPHAGEAL ECHOCARDIOGRAM (TEE);  Surgeon: [name], MD;  Location: [facility] ENDOSCOPY;  Service: Cardiovascular;  Laterality: N/A;  . TONSILLECTOMY      Social History:  reports that she has never smoked. She has never used smokeless tobacco. She reports that she does not drink alcohol and does not use drugs.   Allergies  Allergen Reactions  . Amiodarone Other (See Comments)    sick  . Amoxicillin Nausea And Vomiting  . Amoxicillin-Pot Clavulanate Nausea And Vomiting  . Aspirin Other (See Comments)    Unknown... 325 mg and more.  Elsie Amis Other (See Comments)    Per MAR  . Ciprofloxacin Other (See Comments)    Flu symptoms  . Codeine Nausea And Vomiting  . Dairycare [Lactase-Lactobacillus] Other (See Comments)    Reaction to dairy per MAR  . Diuretic [B[place]u-[place]-Hydran] Other (See Comments)    Per Pharmacy record  . Iohexol Hives     Code: HIVES, Desc: **11/22/08 **NO REACTION W/ OMNI 300// PT S/P 3 CT SCANS W/ IV CM AND NO HIVES/MMS**PT STATED HIVES 16 YRS AGO. POSSIBLE IV DYE W/ CT/XRAY. MEDICATED W/ 50MG  OF BENEDRYL PO PRIOR TO SCAN, Onset Date: [id number]   . Lisinopril Cough  . Midazolam Hcl Nausea And Vomiting  . Quinolones Other (See Comments)    Flu like symptoms  . Sotalol     Unknown   . Sulfa Antibiotics Other (See Comments)    Flu like symptoms 'serum sickness'  . Thioxanthenes Other (See Comments)    Flu like symptoms 'serum sickness'  . Valium Nausea And Vomiting  . Vancomycin Other (See Comments)    unknown  . Versed [Midazolam] Nausea And Vomiting  . Vinegar [Acetic Acid] Other (See Comments)    PER MAR    Family History  Problem Relation Age of Onset  . Heart disease Mother   . Heart attack Father   . Other Sister   . Brain cancer Sister   . Cancer Maternal Grandfather     Prior to  Admission medications   Medication Sig Start Date End Date Taking? Authorizing Provider  Acetaminophen (ARTHRITIS PAIN PO) Take 2 capsules by mouth daily. ARTHRITIS D    [provider]  acetaminophen (TYLENOL) 500 MG tablet Take 500 mg by mouth 3 (three) times daily as needed for mild pain or headache.     [provider]  apixaban (ELIQUIS) 2.5 MG TABS tablet Take 2.5 mg by mouth 2 (two) times daily.     [provider]  Augusto Garbe POWD Take 1 Scoop by mouth daily as needed (in smoothies).    [provider]  Coenzyme Q10 (COQ10) 100 MG CAPS Take 100 mg by mouth daily.    [provider]  D-Mannose POWD Take 5 mLs by mouth daily. In glass of juice    [provider]  estradiol (ESTRACE) 0.1 MG/GM vaginal cream Place 1 Applicatorful vaginally 2 (two) times a week.    [provider]  Flaxseed, Linseed, (FLAX SEEDS) POWD Take 2 packets by mouth as needed (in smoothies). 2 spoonfulls    [provider]  metoprolol tartrate (LOPRESSOR) 25 MG tablet Take 1 tablet (25 mg total) by mouth 2 (two) times daily. 05/13/20   Belva Crome, MD  Multiple Vitamins-Minerals (LUTEIN-ZEAXANTHIN PO) Take 1 capsule by mouth daily.    [provider]  NON FORMULARY Take 1 tablet by mouth 2 (two) times daily as needed (GI upset). Digest gold tablet    [provider]  Omega-3 Fatty Acids (FISH OIL) 1000 MG CAPS Take 2,000 mg by mouth daily.    [provider]  ondansetron (ZOFRAN) 4 MG tablet Take 4 mg by mouth every 6 (six) hours as needed for nausea or vomiting.     [provider]  OVER THE COUNTER MEDICATION Take 1 capsule by mouth daily. CBD Capsules    [provider]  polyethylene glycol (MIRALAX / GLYCOLAX) packet Take 17 g by mouth daily.     [provider]  vitamin B-12 (CYANOCOBALAMIN) 500 MCG tablet Take 500 mcg by mouth daily.    [provider]  vitamin C (ASCORBIC ACID)  250 MG tablet Take 750 mg by mouth daily.    [provider]    Physical Exam: BP (!) 153/65 (BP Location: Right Arm)   Pulse (!) 116   Temp (!) 97.5 F (36.4 C) (Oral)   Resp (!) 26   Ht 5\' 2"  (1.575 m)   Wt 57.4 kg   SpO2 98%   BMI 23.16 kg/m   . General: 86 y.o. year-old female well developed well nourished in no acute distress.  Alert and oriented x3. [name] HEENT: NCAT, EOMI . Neck: Supple, trachea media . Cardiovascular: Regular rate and rhythm with no rubs or gallops.  No thyromegaly or JVD noted.  2/4 pulses in all 4 extremities. [name] Respiratory: Mild crackles in left lower lobe.  No respiratory distress. . Abdomen: Soft nontender nondistended with normal bowel sounds x4 quadrants. . Muskuloskeletal: LUE with ecchymosis.  Bilateral lower extremity edema, no cyanosis or clubbing.  . Neuro: CN II-XII intact, strength, sensation, reflexes . Skin: No ulcerative lesions noted or rashes . Psychiatry: Judgement and insight appear normal. Mood is appropriate for condition and setting          Labs on Admission:  Basic Metabolic Panel: Recent Labs  Lab 05/22/20 1408  NA 130*  K 3.6  CL 95*  CO2 23  GLUCOSE 105*  BUN 17  CREATININE 0.77  CALCIUM 8.1*  MG 1.7   Liver Function Tests: No results for input(s): AST, ALT, ALKPHOS, BILITOT, PROT, ALBUMIN in the last 168 hours. No results for input(s): LIPASE, AMYLASE in the last 168 hours. No results for input(s): AMMONIA in the last 168 hours. CBC: Recent Labs  Lab 05/22/20 1408  WBC 5.8  HGB 10.3*  HCT 31.4*  MCV 109.4*  PLT 76*   Cardiac Enzymes: No results for input(s): CKTOTAL, CKMB, CKMBINDEX, TROPONINI in the last 168 hours.  BNP (last 3 results) Recent Labs    05/22/20 1409  BNP 668.6*    ProBNP (last 3 results) No results for input(s): PROBNP in the last 8760 hours.  CBG: No results for input(s): GLUCAP in the last 168 hours.  Radiological Exams on Admission: DG Chest Portable 1  View  Result Date: 05/22/2020 CLINICAL DATA:  Shortness of breath with lower extremity edema EXAM: PORTABLE CHEST 1 VIEW COMPARISON:  Nov 17, 2016 FINDINGS: There is a small left pleural effusion with airspace opacity in the left base. There is subtle ill-defined opacity in the right mid lung. Lungs elsewhere are clear. There is cardiomegaly with pulmonary vascularity normal. No adenopathy. There is aortic atherosclerosis. No bone lesions. There is mitral annulus calcification. IMPRESSION: Stable cardiomegaly. Left pleural effusion with left lower lobe consolidation, likely due to combination of atelectasis and pneumonia. Subtle ill-defined opacity right mid lung likely a second focus of pneumonia. Aortic Atherosclerosis (ICD10-I70.0). Electronically Signed   By: Lowella Grip III M.D.   On: 05/22/2020 14:22    EKG: I independently viewed the EKG done and my findings are as followed: Atrial flutter at a rate of 77 bpm with prolonged QTc (538ms)  Assessment/Plan Present on Admission: **None**  Active Problems:   Hyponatremia   Acute CHF (congestive heart failure) (HCC)   Macrocytic anemia   Accidental fall   Gait instability   Elevated brain natriuretic peptide (BNP) level   Acute onset of CHF BNP 668.6, patient complained of dyspnea on exertion and bilateral leg swelling Patient was started on diuretics while at the nursing facility without improvement in symptoms Continue total input/output, daily weights and fluid restriction Continue IV Lasix 40 twice daily Continue Cardiac diet  Echocardiogram in the morning   Questionable pneumonia POA Chest x-ray was read to be suggestive of atelectasis or pneumonia Personally reviewed chest x-ray showed blunting of the left costophrenic angle possibly due to edema/effusion suspected to be related to patient's history of dyspnea on exertion that is susceptible to be due to CHF Patient denies fever, chills, cough.  No elevated  WBC Procalcitonin will be done to rule out any infectious process prior to starting antibiotics Continue to manage patient as described above for CHF  Hyponatremia (chronic) Na 130 (baseline at 132-133), continue to monitor sodium levels with morning labs  Accidental fall and gait instability Continue fall precaution and neurochecks Continue PT/OT eval and treat  Macrocytic anemia  MCV 109.4, H/H 10.3/31.4 Continue home vitamin B12 Vitamin B12 and folate level will be checked  Atrial fibrillation on Eliquis CHA2DS2-VASc score of 4 Continue home metoprolol Continue home Eliquis   DVT prophylaxis: Eliquis  Code Status: DNR  Family Communication: None at bedside  Disposition Plan:  Patient is from:                        SNF Anticipated DC to:                   SNF  home Anticipated DC date:               2-3 days Anticipated DC barriers:         Patient is unstable due to newly diagnosed CHF which requires inpatient management.   Consults called: None  Admission status: Inpatient   Bernadette Hoit MD [facility]  05/22/2020, 7:50 PM

## 2020-05-22 NOTE — ED Notes (Signed)
Pt placed on hospital bed for comfort.  Pur wik placed

## 2020-05-22 NOTE — ED Notes (Signed)
Report has been called to rn on 3e

## 2020-05-23 ENCOUNTER — Inpatient Hospital Stay (HOSPITAL_COMMUNITY): Payer: Medicare Other

## 2020-05-23 DIAGNOSIS — W19XXXA Unspecified fall, initial encounter: Secondary | ICD-10-CM | POA: Diagnosis not present

## 2020-05-23 DIAGNOSIS — I5021 Acute systolic (congestive) heart failure: Secondary | ICD-10-CM | POA: Diagnosis not present

## 2020-05-23 DIAGNOSIS — I509 Heart failure, unspecified: Secondary | ICD-10-CM | POA: Diagnosis not present

## 2020-05-23 DIAGNOSIS — R7989 Other specified abnormal findings of blood chemistry: Secondary | ICD-10-CM | POA: Diagnosis not present

## 2020-05-23 DIAGNOSIS — R2681 Unsteadiness on feet: Secondary | ICD-10-CM | POA: Diagnosis not present

## 2020-05-23 DIAGNOSIS — I5033 Acute on chronic diastolic (congestive) heart failure: Secondary | ICD-10-CM | POA: Diagnosis present

## 2020-05-23 LAB — ECHOCARDIOGRAM COMPLETE
Area-P 1/2: 7.16 cm2
Calc EF: 51.9 %
Height: 62 in
MV M vel: 4.68 m/s
MV Peak grad: 87.6 mmHg
P 1/2 time: 518 msec
S' Lateral: 2.8 cm
Single Plane A2C EF: 56.3 %
Single Plane A4C EF: 49.1 %
Weight: 1964.74 oz

## 2020-05-23 LAB — HEPATIC FUNCTION PANEL
ALT: 22 U/L (ref 0–44)
AST: 32 U/L (ref 15–41)
Albumin: 3.5 g/dL (ref 3.5–5.0)
Alkaline Phosphatase: 72 U/L (ref 38–126)
Bilirubin, Direct: 0.5 mg/dL — ABNORMAL HIGH (ref 0.0–0.2)
Indirect Bilirubin: 1.6 mg/dL — ABNORMAL HIGH (ref 0.3–0.9)
Total Bilirubin: 2.1 mg/dL — ABNORMAL HIGH (ref 0.3–1.2)
Total Protein: 6.5 g/dL (ref 6.5–8.1)

## 2020-05-23 LAB — CBC
HCT: 36.1 % (ref 36.0–46.0)
Hemoglobin: 12.2 g/dL (ref 12.0–15.0)
MCH: 35.5 pg — ABNORMAL HIGH (ref 26.0–34.0)
MCHC: 33.8 g/dL (ref 30.0–36.0)
MCV: 104.9 fL — ABNORMAL HIGH (ref 80.0–100.0)
Platelets: 89 10*3/uL — ABNORMAL LOW (ref 150–400)
RBC: 3.44 MIL/uL — ABNORMAL LOW (ref 3.87–5.11)
RDW: 15.2 % (ref 11.5–15.5)
WBC: 7.2 10*3/uL (ref 4.0–10.5)
nRBC: 0 % (ref 0.0–0.2)

## 2020-05-23 LAB — FOLATE: Folate: 19.6 ng/mL (ref >5.9)

## 2020-05-23 LAB — APTT: aPTT: 33 seconds (ref 24–36)

## 2020-05-23 LAB — MAGNESIUM: Magnesium: 1.7 mg/dL (ref 1.7–2.4)

## 2020-05-23 LAB — VITAMIN B12: Vitamin B-12: 2459 pg/mL — ABNORMAL HIGH (ref 180–914)

## 2020-05-23 LAB — PROTIME-INR
INR: 1.8 — ABNORMAL HIGH (ref 0.8–1.2)
Prothrombin Time: 20 seconds — ABNORMAL HIGH (ref 11.4–15.2)

## 2020-05-23 LAB — PHOSPHORUS: Phosphorus: 3.3 mg/dL (ref 2.5–4.6)

## 2020-05-23 LAB — PROCALCITONIN: Procalcitonin: 0.25 ng/mL

## 2020-05-23 MED ORDER — HYDROCORTISONE 0.5 % EX CREA
TOPICAL_CREAM | Freq: Three times a day (TID) | CUTANEOUS | Status: DC | PRN
  Filled 2020-05-23: qty 28.35

## 2020-05-23 NOTE — Progress Notes (Signed)
  Echocardiogram 2D Echocardiogram has been performed.  [name] 05/23/2020, 10:45 AM

## 2020-05-23 NOTE — Evaluation (Signed)
Physical Therapy Evaluation Patient Details Name: [name] MRN: [id number] DOB: Dec 07, 1925 Today's Date: 05/23/2020   History of Present Illness  86 yo female presenting with shortness of breath and BLE swelling. PMH including A fib on eliquis, anxiety, breast cancer, CHF, glaucoma, and arthritis.   Clinical Impression  Prior to admission, pt lives at an ALF, is independent with ADL's and uses walker for mobility. Pt presents with decreased cardiopulmonary endurance and balance deficits. Ambulating 40 feet with a walker at a min guard assist level. SpO2 100% on RA, HR 79-83 bpm. Recommend follow up HHPT to address deficits and maximize functional independence.    Follow Up Recommendations Home health PT;Supervision/Assistance - 24 hour (at [facility])    Equipment Recommendations  None recommended by PT    Recommendations for Other Services       Precautions / Restrictions Precautions Precautions: Fall Restrictions Weight Bearing Restrictions: No      Mobility  Bed Mobility Overal bed mobility: Needs Assistance Bed Mobility: Supine to Sit     Supine to sit: Min assist     General bed mobility comments: MinA for trunk to upright    Transfers Overall transfer level: Needs assistance Equipment used: Rolling walker (2 wheeled) Transfers: Sit to/from Stand Sit to Stand: Min guard         General transfer comment: MIn Guard for initial safety. No physical A needed  Ambulation/Gait Ambulation/Gait assistance: Min guard Gait Distance (Feet): 40 Feet Assistive device: Rolling walker (2 wheeled) Gait Pattern/deviations: Step-through pattern;Decreased stride length Gait velocity: decreased Gait velocity interpretation: <1.8 ft/sec, indicate of risk for recurrent falls General Gait Details: Cues for activity pacing, mild dynamic instability, min guard for safety  Stairs            Wheelchair Mobility    Modified Rankin (Stroke Patients Only)        Balance Overall balance assessment: Needs assistance Sitting-balance support: No upper extremity supported;Feet supported Sitting balance-Leahy Scale: Good     Standing balance support: Bilateral upper extremity supported Standing balance-Leahy Scale: Poor                               Pertinent Vitals/Pain Pain Assessment: No/denies pain    Home Living Family/patient expects to be discharged to:: Private residence Living Arrangements: Alone Available Help at Discharge: Personal care attendant;Available PRN/intermittently (Aide comes two days a week) Type of Home: Assisted living       Home Layout: One level Home Equipment: Clinical cytogeneticist - 2 wheels      Prior Function Level of Independence: Independent with assistive device(s)         Comments: Uses RW for mobility. Perfoms BADLs. Assisted living will perform IADLs and assist with showering two days a week     Hand Dominance   Dominant Hand: Right    Extremity/Trunk Assessment   Upper Extremity Assessment Upper Extremity Assessment: Overall WFL for tasks assessed    Lower Extremity Assessment Lower Extremity Assessment: Overall WFL for tasks assessed    Cervical / Trunk Assessment Cervical / Trunk Assessment: Kyphotic  Communication   Communication: No difficulties  Cognition Arousal/Alertness: Awake/alert Behavior During Therapy: WFL for tasks assessed/performed Overall Cognitive Status: Within Functional Limits for tasks assessed                                 General Comments:  Sometimes needing increased time; but feel this is age appropriate. Very eager and agreeable.       General Comments      Exercises     Assessment/Plan    PT Assessment Patient needs continued PT services  PT Problem List Decreased strength;Decreased activity tolerance;Decreased balance;Decreased mobility       PT Treatment Interventions DME instruction;Gait training;Functional  mobility training;Therapeutic activities;Therapeutic exercise;Balance training;Patient/family education    PT Goals (Current goals can be found in the Care Plan section)  Acute Rehab PT Goals Patient Stated Goal: Figure out why I get dizzy PT Goal Formulation: With patient Time For Goal Achievement: 06/06/20 Potential to Achieve Goals: Good    Frequency Min 3X/week   Barriers to discharge        Co-evaluation               AM-PAC PT "6 Clicks" Mobility  Outcome Measure Help needed turning from your back to your side while in a flat bed without using bedrails?: None Help needed moving from lying on your back to sitting on the side of a flat bed without using bedrails?: A Little Help needed moving to and from a bed to a chair (including a wheelchair)?: A Little Help needed standing up from a chair using your arms (e.g., wheelchair or bedside chair)?: A Little Help needed to walk in hospital room?: A Little Help needed climbing 3-5 steps with a railing? : A Lot 6 Click Score: 18    End of Session   Activity Tolerance: Patient tolerated treatment well Patient left: in chair;with call bell/phone within reach;with chair alarm set Nurse Communication: Mobility status PT Visit Diagnosis: Unsteadiness on feet (R26.81);History of falling (Z91.81)    Time: 0354-6568 PT Time Calculation (min) (ACUTE ONLY): 12 min   Charges:   PT Evaluation $PT Eval Moderate Complexity: 1 Mod          Wyona Almas, PT, DPT Acute Rehabilitation Services Pager [phone] Office [phone]   Deno Etienne 05/23/2020, 12:58 PM

## 2020-05-23 NOTE — Evaluation (Signed)
Occupational Therapy Evaluation Patient Details Name: [name] MRN: [id number] DOB: 09-22-1925 Today's Date: 05/23/2020    History of Present Illness 86 yo female presenting with shortness of breath and BLE swelling. PMH including A fib on eliquis, anxiety, breast cancer, CHF, glaucoma, and arthritis.    Clinical Impression   PTA, pt was living alone and was performing BADLs and using RW for functional mobility. Pt currently performing ADLs and functional mobility at [facility] A level. Pt performing grooming at sink, peri care/LB bathing, and functional mobility using RW with Supervision-Min Guard A. HR 112-121 and SpO2 97-88% on RA; when dropping to 88% SpO2 quickly returns to 90s. Pt very eager and appreciative of therapy. Pt would benefit from further acute OT to facilitate safe dc. Recommend dc to home with HHOT for further OT to optimize safety, independence with ADLs, and return to PLOF.      Follow Up Recommendations  Home health OT;Supervision/Assistance - 24 hour    Equipment Recommendations  None recommended by OT    Recommendations for Other Services PT consult     Precautions / Restrictions Precautions Precautions: Fall Precaution Comments: Pt will falls at home; reports she gets dizziness and feels weak.      Mobility Bed Mobility Overal bed mobility: Modified Independent             General bed mobility comments: Increased time    Transfers Overall transfer level: Needs assistance Equipment used: Rolling walker (2 wheeled) Transfers: Sit to/from Stand Sit to Stand: Min guard         General transfer comment: MIn Guard for initial safety. No physical A needed    Balance Overall balance assessment: Needs assistance Sitting-balance support: No upper extremity supported;Feet supported Sitting balance-Leahy Scale: Good     Standing balance support: No upper extremity supported;During functional activity Standing balance-Leahy Scale:  Fair                             ADL either performed or assessed with clinical judgement   ADL Overall ADL's : Needs assistance/impaired Eating/Feeding: Set up;Sitting   Grooming: Wash/dry hands;Wash/dry face;Supervision/safety;Set up;Standing   Upper Body Bathing: Supervision/ safety;Set up;Sitting   Lower Body Bathing: Min guard;Sit to/from stand Lower Body Bathing Details (indicate cue type and reason): Pt performing peri care at sink without physical A Upper Body Dressing : Supervision/safety;Set up;Sitting   Lower Body Dressing: Min guard;Sit to/from stand Lower Body Dressing Details (indicate cue type and reason): Pt donning socks at EOB using figure four methos Toilet Transfer: Min guard;Ambulation;RW (simulated to recliner)   Toileting- Clothing Manipulation and Hygiene: Min guard;Sit to/from stand       Functional mobility during ADLs: Min guard;Rolling walker General ADL Comments: Pt performing grooming, peri care, and functional mobility in room at [place] level.      Vision Baseline Vision/History: Wears glasses Patient Visual Report: No change from baseline       Perception     Praxis      Pertinent Vitals/Pain Pain Assessment: No/denies pain     Hand Dominance Right   Extremity/Trunk Assessment Upper Extremity Assessment Upper Extremity Assessment: Overall WFL for tasks assessed   Lower Extremity Assessment Lower Extremity Assessment: Defer to PT evaluation   Cervical / Trunk Assessment Cervical / Trunk Assessment: Kyphotic   Communication Communication Communication: No difficulties   Cognition Arousal/Alertness: Awake/alert Behavior During Therapy: WFL for tasks assessed/performed Overall Cognitive Status:  Within Functional Limits for tasks assessed                                 General Comments: Sometimes needing increased time; but feel this is age appropriate. Very eager and agreeable.     General Comments  HR 112-121. SpO2 98-93%    Exercises     Shoulder Instructions      Home Living Family/patient expects to be discharged to:: Private residence Living Arrangements: Alone Available Help at Discharge: Personal care attendant;Available PRN/intermittently (Aide comes two days a week) Type of Home: Assisted living       Home Layout: One level     Bathroom Shower/Tub: Occupational psychologist: Handicapped height     Home Equipment: Clinical cytogeneticist - 2 wheels          Prior Functioning/Environment Level of Independence: Independent with assistive device(s)        Comments: Uses RW for mobility. Perfoms BADLs. Assisted living will perform IADLs and assist with showering two days a week        OT Problem List: Decreased strength;Decreased activity tolerance;Impaired balance (sitting and/or standing);Decreased knowledge of use of DME or AE;Decreased knowledge of precautions      OT Treatment/Interventions: Self-care/ADL training;Therapeutic exercise;Energy conservation;DME and/or AE instruction;Therapeutic activities;Patient/family education    OT Goals(Current goals can be found in the care plan section) Acute Rehab OT Goals Patient Stated Goal: Figure out why I get dizzy OT Goal Formulation: With patient Time For Goal Achievement: 06/06/20 Potential to Achieve Goals: Good  OT Frequency: Min 2X/week   Barriers to D/C:            Co-evaluation              AM-PAC OT "6 Clicks" Daily Activity     Outcome Measure Help from another person eating meals?: None Help from another person taking care of personal grooming?: A Little Help from another person toileting, which includes using toliet, bedpan, or urinal?: A Little Help from another person bathing (including washing, rinsing, drying)?: A Little Help from another person to put on and taking off regular upper body clothing?: None Help from another person to put on and taking off  regular lower body clothing?: A Little 6 Click Score: 20   End of Session Equipment Utilized During Treatment: Rolling walker Nurse Communication: Mobility status  Activity Tolerance: Patient tolerated treatment well Patient left: in chair;with call bell/phone within reach;with chair alarm set  OT Visit Diagnosis: Unsteadiness on feet (R26.81);Other abnormalities of gait and mobility (R26.89);Muscle weakness (generalized) (M62.81)                Time: 8527-7824 OT Time Calculation (min): 28 min Charges:  OT General Charges $OT Visit: 1 Visit OT Evaluation $OT Eval Low Complexity: 1 Low OT Treatments $Self Care/Home Management : 8-22 mins  Navraj Dreibelbis MSOT, OTR/L Acute Rehab Pager: [phone] Office: [place] 05/23/2020, 8:25 AM

## 2020-05-24 ENCOUNTER — Encounter: Payer: Self-pay | Admitting: Internal Medicine

## 2020-05-24 DIAGNOSIS — W19XXXA Unspecified fall, initial encounter: Secondary | ICD-10-CM

## 2020-05-24 DIAGNOSIS — R7989 Other specified abnormal findings of blood chemistry: Secondary | ICD-10-CM

## 2020-05-24 DIAGNOSIS — E871 Hypo-osmolality and hyponatremia: Secondary | ICD-10-CM

## 2020-05-24 DIAGNOSIS — R2681 Unsteadiness on feet: Secondary | ICD-10-CM

## 2020-05-24 DIAGNOSIS — I5033 Acute on chronic diastolic (congestive) heart failure: Secondary | ICD-10-CM | POA: Diagnosis not present

## 2020-05-24 DIAGNOSIS — D539 Nutritional anemia, unspecified: Secondary | ICD-10-CM

## 2020-05-24 LAB — CBC WITH DIFFERENTIAL/PLATELET
Abs Immature Granulocytes: 0.03 10*3/uL (ref 0.00–0.07)
Basophils Absolute: 0 10*3/uL (ref 0.0–0.1)
Basophils Relative: 1 %
Eosinophils Absolute: 0.1 10*3/uL (ref 0.0–0.5)
Eosinophils Relative: 1 %
HCT: 37.2 % (ref 36.0–46.0)
Hemoglobin: 12.5 g/dL (ref 12.0–15.0)
Immature Granulocytes: 0 %
Lymphocytes Relative: 14 %
Lymphs Abs: 1 10*3/uL (ref 0.7–4.0)
MCH: 35 pg — ABNORMAL HIGH (ref 26.0–34.0)
MCHC: 33.6 g/dL (ref 30.0–36.0)
MCV: 104.2 fL — ABNORMAL HIGH (ref 80.0–100.0)
Monocytes Absolute: 0.8 10*3/uL (ref 0.1–1.0)
Monocytes Relative: 12 %
Neutro Abs: 4.9 10*3/uL (ref 1.7–7.7)
Neutrophils Relative %: 72 %
Platelets: 76 10*3/uL — ABNORMAL LOW (ref 150–400)
RBC: 3.57 MIL/uL — ABNORMAL LOW (ref 3.87–5.11)
RDW: 15 % (ref 11.5–15.5)
WBC: 6.8 10*3/uL (ref 4.0–10.5)
nRBC: 0 % (ref 0.0–0.2)

## 2020-05-24 LAB — COMPREHENSIVE METABOLIC PANEL
ALT: 22 U/L (ref 0–44)
AST: 33 U/L (ref 15–41)
Albumin: 3.5 g/dL (ref 3.5–5.0)
Alkaline Phosphatase: 79 U/L (ref 38–126)
Anion gap: 17 — ABNORMAL HIGH (ref 5–15)
BUN: 14 mg/dL (ref 8–23)
CO2: 25 mmol/L (ref 22–32)
Calcium: 9 mg/dL (ref 8.9–10.3)
Chloride: 90 mmol/L — ABNORMAL LOW (ref 98–111)
Creatinine, Ser: 0.82 mg/dL (ref 0.44–1.00)
GFR, Estimated: >60 mL/min (ref >60)
Glucose, Bld: 107 mg/dL — ABNORMAL HIGH (ref 70–99)
Potassium: 3.6 mmol/L (ref 3.5–5.1)
Sodium: 132 mmol/L — ABNORMAL LOW (ref 135–145)
Total Bilirubin: 2.2 mg/dL — ABNORMAL HIGH (ref 0.3–1.2)
Total Protein: 6.7 g/dL (ref 6.5–8.1)

## 2020-05-24 LAB — PROCALCITONIN: Procalcitonin: 0.23 ng/mL

## 2020-05-24 MED ORDER — ACETAMINOPHEN 325 MG PO TABS
650.0000 mg | ORAL_TABLET | Freq: Four times a day (QID) | ORAL | Status: DC | PRN
  Administered 2020-05-24: 650 mg via ORAL
  Filled 2020-05-24: qty 2

## 2020-05-24 NOTE — Progress Notes (Signed)
[facility]  PROGRESS NOTE  KEYONI LAPINSKI OIZ:[id number] DOB: 1926/03/23 DOA: 05/22/2020 PCP: Gayland Curry, DO Admit date - 05/22/2020   Admitting Physician Bernadette Hoit, DO  Outpatient Primary MD for the patient is Gayland Curry, DO  LOS - 2 Brief Narrative  Ms. Bowley is a 86 year old female with medical history significant for atrial fibrillation on Eliquis and Lopressor who presented to the ED on 11/14 from [facility] with 1 week of increasing shortness of breath and lower leg swelling not improving with recently started Lasix at her facility as she was found to be tachypneic, with BNP of 668, chest x-ray consistent with cardiomegaly and left-sided pleural effusion and started on IV Lasix for presumed diagnosis of congestive heart failure exacerbation.   Above patient underwent TTE on 11/15 which was consistent with grade 2 diastolic dysfunction and preserved EF of 50-55%.  Subjective  Today feels breathing is improved, but still notes she is quite tired and short of breath while sitting in bedside chair  A & P    Acute on chronic CHF with preserved ejection fraction exacerbation.  TTE on 11/15 shows preserved EF, grade 2 diastolic dysfunction.  Patient reports improvement in dyspnea on IV Lasix though still gets quite tired/short of breath while sitting in bedside chair.  Still has notable 2+ pitting edema bilateral lower extremities despite IV Lasix.  So far in hospital stay patient is net -3 L. -Continue IV Lasix 40 mg twice daily for additional day, hopeful will be able to transition to oral 11/17-continue daily weights -Continue to monitor output  Hyponatremia, hypervolemic.  130 on admission, currently 132. -Expect continued improvement with diuresis -Monitor BMP  Atrial fibrillation, currently rate controlled. -Continue home Lopressor and Eliquis  History of macrocytic anemia secondary to B12 deficiency.  B12, folate levels normal here -Continue  home B12 graft    fall and gait instability prior to admission -Continue fall precautions -PT recommends home health PT discharge     Family Communication  : None  Code Status : DNR  Date of admission  Disposition Plan  :  Patient is from [facility] facility. Anticipated d/c date:  1 to 2 days. Barriers to d/c or necessity for inpatient status:  Still volume overloaded requiring IV Lasix, if improvement in peripheral edema/symptoms over the next 24 to 48 hours expecting transition to oral Lasix Consults  : None  Procedures  : TTE, 11/15  DVT Prophylaxis  : Eliquis MDM: The below labs and imaging reports were reviewed and summarized above.  Medication management as above.  Lab Results  Component Value Date   PLT 76 (L) 05/24/2020    Diet :  Diet Order            Diet Heart Room service appropriate? Yes; Fluid consistency: Thin  Diet effective now                  Inpatient Medications Scheduled Meds: . apixaban  2.5 mg Oral BID  . furosemide  40 mg Intravenous Q12H  . metoprolol tartrate  25 mg Oral QHS  . metoprolol tartrate  50 mg Oral q morning - 10a  . vitamin B-12  500 mcg Oral Daily   Continuous Infusions: PRN Meds:.acetaminophen, hydrocortisone cream  Antibiotics  :   Anti-infectives (From admission, onward)   None       Objective   Vitals:   05/23/20 2031 05/24/20 0000 05/24/20 0416 05/24/20 1112  BP: 132/65  (!) 122/57 [name])  123/54  Pulse: 97  92 92  Resp: 18  18 18   Temp: 97.8 F (36.6 C)  98.2 F (36.8 C) (!) 97.5 F (36.4 C)  TempSrc: Oral  Oral Oral  SpO2: 90%  93% 96%  Weight:  54.3 kg    Height:        SpO2: 96 %  Wt Readings from Last 3 Encounters:  05/24/20 54.3 kg  04/06/20 57.8 kg  03/17/20 58.6 kg     Intake/Output Summary (Last 24 hours) at 05/24/2020 1335 Last data filed at 05/24/2020 1330 Gross per 24 hour  Intake 200 ml  Output 2025 ml  Net -1825 ml    Physical Exam:   Awake Alert, Oriented X 3, Normal  affect No new F.N deficits,  [place].AT, Normal respiratory effort on room air, crackles heard to mid bases RRR,No Gallops,Rubs or new Murmurs, 2+ pitting edema bilateral lower extremities from feet to mid calf +ve B.Sounds, Abd Soft, No tenderness, No rebound, guarding or rigidity. No Cyanosis, No new Rash or bruise    I have personally reviewed the following:   Data Reviewed:  CBC Recent Labs  Lab 05/22/20 1408 05/23/20 0225 05/24/20 0444  WBC 5.8 7.2 6.8  HGB 10.3* 12.2 12.5  HCT 31.4* 36.1 37.2  PLT 76* 89* 76*  MCV 109.4* 104.9* 104.2*  MCH 35.9* 35.5* 35.0*  MCHC 32.8 33.8 33.6  RDW 15.3 15.2 15.0  LYMPHSABS  --   --  1.0  MONOABS  --   --  0.8  EOSABS  --   --  0.1  BASOSABS  --   --  0.0    Chemistries  Recent Labs  Lab 05/22/20 1408 05/23/20 0225 05/23/20 0404 05/24/20 0444  NA 130*  --   --  132*  K 3.6  --   --  3.6  CL 95*  --   --  90*  CO2 23  --   --  25  GLUCOSE 105*  --   --  107*  BUN 17  --   --  14  CREATININE 0.77  --   --  0.82  CALCIUM 8.1*  --   --  9.0  MG 1.7 1.7  --   --   AST  --   --  32 33  ALT  --   --  22 22  ALKPHOS  --   --  72 79  BILITOT  --   --  2.1* 2.2*   ------------------------------------------------------------------------------------------------------------------ No results for input(s): CHOL, HDL, LDLCALC, TRIG, CHOLHDL, LDLDIRECT in the last 72 hours.  Lab Results  Component Value Date   HGBA1C  12/14/2008    5.2 (NOTE) The ADA recommends the following therapeutic goal for glycemic control related to Hgb A1c measurement: Goal of therapy: <6.5 Hgb A1c  Reference: American Diabetes Association: Clinical Practice Recommendations 2010, Diabetes Care, 2010, 33: (Suppl  1).   ------------------------------------------------------------------------------------------------------------------ No results for input(s): TSH, T4TOTAL, T3FREE, THYROIDAB in the last 72 hours.  Invalid input(s):  FREET3 ------------------------------------------------------------------------------------------------------------------ Recent Labs    05/23/20 0225  VITAMINB12 2,459*  FOLATE 19.6    Coagulation profile Recent Labs  Lab 05/22/20 1408 05/23/20 0225  INR 1.7* 1.8*    No results for input(s): DDIMER in the last 72 hours.  Cardiac Enzymes No results for input(s): CKMB, TROPONINI, MYOGLOBIN in the last 168 hours.  Invalid input(s): CK ------------------------------------------------------------------------------------------------------------------    Component Value Date/Time   BNP 668.6 (H) 05/22/2020 1409  Micro Results Recent Results (from the past 240 hour(s))  Respiratory Panel by RT PCR (Flu A&B, Covid) - Nasopharyngeal Swab     Status: None   Collection Time: 05/22/20  2:09 PM   Specimen: Nasopharyngeal Swab  Result Value Ref Range Status   SARS Coronavirus 2 by RT PCR NEGATIVE NEGATIVE Final    Comment: (NOTE) SARS-CoV-2 target nucleic acids are NOT DETECTED.  The SARS-CoV-2 RNA is generally detectable in upper respiratoy specimens during the acute phase of infection. The lowest concentration of SARS-CoV-2 viral copies this assay can detect is 131 copies/mL. A negative result does not preclude SARS-Cov-2 infection and should not be used as the sole basis for treatment or other patient management decisions. A negative result may occur with  improper specimen collection/handling, submission of specimen other than nasopharyngeal swab, presence of viral mutation(s) within the areas targeted by this assay, and inadequate number of viral copies (<131 copies/mL). A negative result must be combined with clinical observations, patient history, and epidemiological information. The expected result is Negative.  Fact Sheet for Patients:  PinkCheek.be  Fact Sheet for Healthcare Providers:   GravelBags.it  This test is no t yet approved or cleared by the [place] FDA and  has been authorized for detection and/or diagnosis of SARS-CoV-2 by FDA under an Emergency Use Authorization (EUA). This EUA will remain  in effect (meaning this test can be used) for the duration of the COVID-19 declaration under Section 564(b)(1) of the Act, 21 U.S.C. section 360bbb-3(b)(1), unless the authorization is terminated or revoked sooner.     Influenza A by PCR NEGATIVE NEGATIVE Final   Influenza B by PCR NEGATIVE NEGATIVE Final    Comment: (NOTE) The Xpert Xpress SARS-CoV-2/FLU/RSV assay is intended as an aid in  the diagnosis of influenza from Nasopharyngeal swab specimens and  should not be used as a sole basis for treatment. Nasal washings and  aspirates are unacceptable for Xpert Xpress SARS-CoV-2/FLU/RSV  testing.  Fact Sheet for Patients: PinkCheek.be  Fact Sheet for Healthcare Providers: GravelBags.it  This test is not yet approved or cleared by the [place] FDA and  has been authorized for detection and/or diagnosis of SARS-CoV-2 by  FDA under an Emergency Use Authorization (EUA). This EUA will remain  in effect (meaning this test can be used) for the duration of the  Covid-19 declaration under Section 564(b)(1) of the Act, 21  U.S.C. section 360bbb-3(b)(1), unless the authorization is  terminated or revoked. Performed at [facility], [place] [address] [place], [place] [address]     Radiology Reports CT Head Wo Contrast  Result Date: 05/15/2020 CLINICAL DATA:  Fall 3 days ago EXAM: CT HEAD WITHOUT CONTRAST TECHNIQUE: Contiguous axial images were obtained from the base of the skull through the vertex without intravenous contrast. COMPARISON:  None. FINDINGS: Brain: No evidence of acute territorial infarction, hemorrhage, hydrocephalus,extra-axial collection or mass  lesion/mass effect. There is dilatation the ventricles and sulci consistent with age-related atrophy. Low-attenuation changes in the deep white matter consistent with small vessel ischemia. There appears to be a probable prior lacunar infarct involving the left insular cortex. Vascular: No hyperdense vessel or unexpected calcification. Skull: The skull is intact. No fracture or focal lesion identified. Sinuses/Orbits: The visualized paranasal sinuses and mastoid air cells are clear. The orbits and globes intact. Other: None Cervical spine: Alignment: There is a mild leftward curvature of the cervical spine. Skull base and vertebrae: Visualized skull base is intact. No atlanto-occipital dissociation. The vertebral  body heights are well maintained. No fracture or pathologic osseous lesion seen. Soft tissues and spinal canal: The visualized paraspinal soft tissues are unremarkable. No prevertebral soft tissue swelling is seen. The spinal canal is grossly unremarkable, no large epidural collection or significant canal narrowing. Disc levels: Cervical spine spondylosis is noted with disc osteophyte complex and uncovertebral osteophytes most notable at C5-C6 and C6-C7 with severe neural foraminal narrowing and mild central canal stenosis. Upper chest: Biapical scarring is seen. Thoracic inlet is within normal limits. Other: None IMPRESSION: No acute intracranial abnormality. Findings consistent with age related atrophy and chronic small vessel ischemia Probable prior lacunar infarct within the left insular cortex. No acute fracture or malalignment of the spine. Electronically Signed   By: Prudencio Pair M.D.   On: 05/15/2020 15:05   CT Cervical Spine Wo Contrast  Result Date: 05/15/2020 CLINICAL DATA:  Fall 3 days ago EXAM: CT HEAD WITHOUT CONTRAST TECHNIQUE: Contiguous axial images were obtained from the base of the skull through the vertex without intravenous contrast. COMPARISON:  None. FINDINGS: Brain: No evidence  of acute territorial infarction, hemorrhage, hydrocephalus,extra-axial collection or mass lesion/mass effect. There is dilatation the ventricles and sulci consistent with age-related atrophy. Low-attenuation changes in the deep white matter consistent with small vessel ischemia. There appears to be a probable prior lacunar infarct involving the left insular cortex. Vascular: No hyperdense vessel or unexpected calcification. Skull: The skull is intact. No fracture or focal lesion identified. Sinuses/Orbits: The visualized paranasal sinuses and mastoid air cells are clear. The orbits and globes intact. Other: None Cervical spine: Alignment: There is a mild leftward curvature of the cervical spine. Skull base and vertebrae: Visualized skull base is intact. No atlanto-occipital dissociation. The vertebral body heights are well maintained. No fracture or pathologic osseous lesion seen. Soft tissues and spinal canal: The visualized paraspinal soft tissues are unremarkable. No prevertebral soft tissue swelling is seen. The spinal canal is grossly unremarkable, no large epidural collection or significant canal narrowing. Disc levels: Cervical spine spondylosis is noted with disc osteophyte complex and uncovertebral osteophytes most notable at C5-C6 and C6-C7 with severe neural foraminal narrowing and mild central canal stenosis. Upper chest: Biapical scarring is seen. Thoracic inlet is within normal limits. Other: None IMPRESSION: No acute intracranial abnormality. Findings consistent with age related atrophy and chronic small vessel ischemia Probable prior lacunar infarct within the left insular cortex. No acute fracture or malalignment of the spine. Electronically Signed   By: Prudencio Pair M.D.   On: 05/15/2020 15:05   DG Chest Portable 1 View  Result Date: 05/22/2020 CLINICAL DATA:  Shortness of breath with lower extremity edema EXAM: PORTABLE CHEST 1 VIEW COMPARISON:  Nov 17, 2016 FINDINGS: There is a small left  pleural effusion with airspace opacity in the left base. There is subtle ill-defined opacity in the right mid lung. Lungs elsewhere are clear. There is cardiomegaly with pulmonary vascularity normal. No adenopathy. There is aortic atherosclerosis. No bone lesions. There is mitral annulus calcification. IMPRESSION: Stable cardiomegaly. Left pleural effusion with left lower lobe consolidation, likely due to combination of atelectasis and pneumonia. Subtle ill-defined opacity right mid lung likely a second focus of pneumonia. Aortic Atherosclerosis (ICD10-I70.0). Electronically Signed   By: Lowella Grip III M.D.   On: 05/22/2020 14:22   ECHOCARDIOGRAM COMPLETE  Result Date: 05/23/2020    ECHOCARDIOGRAM REPORT   Patient Name:   CATHERINA PATES Date of Exam: 05/23/2020 Medical Rec #:  [id number]     Height:  62.0 in Accession #:    [id number]    Weight:       122.8 lb Date of Birth:  May 21, 1926      BSA:          1.554 m Patient Age:    76 years      BP:           129/67 mmHg Patient Gender: F             HR:           71 bpm. Exam Location:  Inpatient Procedure: 2D Echo, Cardiac Doppler and Color Doppler Indications:    CHF-Acute Systolic 631.49 / F02.63  History:        Patient has prior history of Echocardiogram examinations, most                 recent 09/10/2012. CHF, Arrythmias:Atrial Fibrillation and Atrial                 Flutter, Signs/Symptoms:Shortness of Breath; Risk                 Factors:Non-Smoker. GERD.                  Mitral Valve: prosthetic annuloplasty ring valve is present in                 the mitral position. Procedure Date: 12/2008.  Sonographer:    Vickie Epley RDCS Referring Phys: [id number] OLADAPO ADEFESO IMPRESSIONS  1. Left ventricular ejection fraction, by estimation, is 50 to 55%. The left ventricle has low normal function. The left ventricle has no regional wall motion abnormalities. Left ventricular diastolic parameters are consistent with Grade II diastolic dysfunction  (pseudonormalization). There is the interventricular septum is flattened in systole and diastole, consistent with right ventricular pressure and volume overload.  2. Right ventricular systolic function is normal. The right ventricular size is normal. There is severely elevated pulmonary artery systolic pressure. The estimated right ventricular systolic pressure is 27.7 mmHg.  3. Left atrial size was severely dilated.  4. Right atrial size was severely dilated.  5. The mitral valve has been repaired/replaced. Mild to moderate mitral valve regurgitation. No evidence of mitral stenosis. There is a prosthetic annuloplasty ring present in the mitral position. Procedure Date: 12/2008.  6. The aortic valve is tricuspid. Aortic valve regurgitation is moderate to severe. No aortic stenosis is present.  7. The inferior vena cava is normal in size with greater than 50% respiratory variability, suggesting right atrial pressure of 3 mmHg. Comparison(s): Prior images unable to be directly viewed, comparison made by report only. Prior EF 50-55% in 2014 report. Aortic regurgitation has advanced from trivial. FINDINGS  Left Ventricle: Left ventricular ejection fraction, by estimation, is 50 to 55%. The left ventricle has low normal function. The left ventricle has no regional wall motion abnormalities. The left ventricular internal cavity size was normal in size. There is no left ventricular hypertrophy. The interventricular septum is flattened in systole and diastole, consistent with right ventricular pressure and volume overload. Left ventricular diastolic parameters are consistent with Grade II diastolic dysfunction (pseudonormalization). Right Ventricle: The right ventricular size is normal. No increase in right ventricular wall thickness. Right ventricular systolic function is normal. There is severely elevated pulmonary artery systolic pressure. The tricuspid regurgitant velocity is 3.60 m/s, and with an assumed right atrial  pressure of 15 mmHg, the estimated right ventricular systolic pressure is 41.2 mmHg. Left Atrium: Left atrial size  was severely dilated. Right Atrium: Right atrial size was severely dilated. Pericardium: There is no evidence of pericardial effusion. Mitral Valve: The mitral valve has been repaired/replaced. Mild to moderate mitral valve regurgitation, with anteriorly-directed jet. There is a prosthetic annuloplasty ring present in the mitral position. Procedure Date: 12/2008. No evidence of mitral valve stenosis. MV peak gradient, 12.8 mmHg. The mean mitral valve gradient is 4.0 mmHg. Tricuspid Valve: The tricuspid valve is normal in structure. Tricuspid valve regurgitation is not demonstrated. No evidence of tricuspid stenosis. Aortic Valve: The aortic valve is tricuspid. Aortic valve regurgitation is moderate to severe. Aortic regurgitation PHT measures 518 msec. No aortic stenosis is present. Pulmonic Valve: The pulmonic valve was normal in structure. Pulmonic valve regurgitation is mild to moderate. No evidence of pulmonic stenosis. Aorta: The aortic root is normal in size and structure. Venous: The inferior vena cava is normal in size with greater than 50% respiratory variability, suggesting right atrial pressure of 3 mmHg. IAS/Shunts: No atrial level shunt detected by color flow Doppler.  LEFT VENTRICLE PLAX 2D LVIDd:         3.80 cm     Diastology LVIDs:         2.80 cm     LV e' medial:    3.88 cm/s LV PW:         0.90 cm     LV E/e' medial:  35.6 LV IVS:        0.90 cm     LV e' lateral:   5.80 cm/s LVOT diam:     2.00 cm     LV E/e' lateral: 23.8 LV SV:         52 LV SV Index:   33 LVOT Area:     3.14 cm  LV Volumes (MOD) LV vol d, MOD A2C: 80.4 ml LV vol d, MOD A4C: 77.8 ml LV vol s, MOD A2C: 35.1 ml LV vol s, MOD A4C: 39.6 ml LV SV MOD A2C:     45.3 ml LV SV MOD A4C:     77.8 ml LV SV MOD BP:      41.2 ml RIGHT VENTRICLE RV S prime:     9.38 cm/s TAPSE (M-mode): 1.6 cm LEFT ATRIUM             Index        RIGHT ATRIUM           Index LA diam:        5.50 cm 3.54 cm/m  RA Area:     28.90 cm LA Vol (A2C):   78.0 ml 50.20 ml/m RA Volume:   98.40 ml  63.33 ml/m LA Vol (A4C):   89.2 ml 57.41 ml/m LA Biplane Vol: 85.8 ml 55.22 ml/m  AORTIC VALVE LVOT Vmax:   80.90 cm/s LVOT Vmean:  54.800 cm/s LVOT VTI:    0.165 m AI PHT:      518 msec  AORTA Ao Asc diam: 3.30 cm MITRAL VALVE                TRICUSPID VALVE MV Area (PHT): 7.16 cm     TR Peak grad:   51.8 mmHg MV Peak grad:  12.8 mmHg    TR Vmax:        360.00 cm/s MV Mean grad:  4.0 mmHg MV Vmax:       1.79 m/s     SHUNTS MV Vmean:      84.4 cm/s    Systemic  VTI:  0.16 m MV Decel Time: 106 msec     Systemic Diam: 2.00 cm MR Peak grad: 87.6 mmHg MR Mean grad: 60.0 mmHg MR Vmax:      468.00 cm/s MR Vmean:     362.0 cm/s MV E velocity: 138.00 cm/s MV A velocity: 78.20 cm/s MV E/A ratio:  1.76 Candee Furbish MD Electronically signed by Candee Furbish MD Signature Date/Time: 05/23/2020/11:32:20 AM    Final      Time Spent in minutes  30     Desiree Hane M.D on 05/24/2020 at 1:35 PM  To page go to [email or web address] - password [facility]

## 2020-05-24 NOTE — Progress Notes (Signed)
[facility]  PROGRESS NOTE  [name] CLE:[id number] DOB: 1925-08-01 DOA: 05/22/2020 PCP: Tara Curry, DO Admit date - 05/22/2020   Admitting Physician Bernadette Hoit, DO  Outpatient Primary MD for the patient is Tara Curry, DO  LOS - 2 Brief Narrative  [name] is a 86 year old female with medical history significant for atrial fibrillation on Eliquis and Lopressor who presented to the ED on 11/14 from her [facility] with 1 week of increasing shortness of breath and lower leg swelling not improving with recently started Lasix at her facility as she was found to be tachypneic, with BNP of 668, chest x-ray consistent with cardiomegaly and left-sided pleural effusion and started on IV Lasix for presumed diagnosis of congestive heart failure exacerbation.   Above patient underwent TTE on 11/15 which was consistent with grade 2 diastolic dysfunction and preserved EF of 50-55%.  Subjective  Notes she was very short of breath at her facility. Feels breathing has improved since lasix in the ED  A & P   Acute on chronic CHF with preserved ejection fraction exacerbation.  TTE on 11/15 shows preserved EF, grade 2 diastolic dysfunction.  Patient reports improvement in dyspnea on IV Lasix though still significantly volume up with notable 2+ pitting edema bilateral lower extremities to mid thighs.   -Continue IV Lasix 40 mg twice daily f= -continue daily weights -Continue to monitor output  Hyponatremia, hypervolemic.  130 on admission,  -Expect improvement with diuresis -Monitor BMP  Atrial fibrillation, currently rate controlled. -Continue home Lopressor and Eliquis  History of macrocytic anemia secondary to B12 deficiency.  B12, folate levels normal here -Continue home B12 graft    fall and gait instability prior to admission -Continue fall precautions -PT recommends home health PT discharge     Family Communication  : None  Code Status : DNR  Date of  admission  Disposition Plan  :  Patient is from [place] facility. Anticipated d/c date:  1 to 2 days. Barriers to d/c or necessity for inpatient status:  Still volume overloaded requiring IV Lasix,  Consults  : None  Procedures  : TTE, 11/15  DVT Prophylaxis  : Eliquis MDM: The below labs and imaging reports were reviewed and summarized above.  Medication management as above.  Lab Results  Component Value Date   PLT 76 (L) 05/24/2020    Diet :  Diet Order            Diet Heart Room service appropriate? Yes; Fluid consistency: Thin  Diet effective now                  Inpatient Medications Scheduled Meds: . apixaban  2.5 mg Oral BID  . furosemide  40 mg Intravenous Q12H  . metoprolol tartrate  25 mg Oral QHS  . metoprolol tartrate  50 mg Oral q morning - 10a  . vitamin B-12  500 mcg Oral Daily   Continuous Infusions: PRN Meds:.acetaminophen, hydrocortisone cream  Antibiotics  :   Anti-infectives (From admission, onward)   None       Objective   Vitals:   05/23/20 2031 05/24/20 0000 05/24/20 0416 05/24/20 1112  BP: 132/65  (!) 122/57 (!) 123/54  Pulse: 97  92 92  Resp: 18  18 18   Temp: 97.8 F (36.6 C)  98.2 F (36.8 C) (!) 97.5 F (36.4 C)  TempSrc: Oral  Oral Oral  SpO2: 90%  93% 96%  Weight:  54.3 kg  Height:        SpO2: 96 %  Wt Readings from Last 3 Encounters:  05/24/20 54.3 kg  04/06/20 57.8 kg  03/17/20 58.6 kg     Intake/Output Summary (Last 24 hours) at 05/24/2020 1341 Last data filed at 05/24/2020 1330 Gross per 24 hour  Intake 200 ml  Output 2025 ml  Net -1825 ml    Physical Exam:   Awake Alert, Oriented X 3, Normal affect No new F.N deficits,  [place].AT, Normal respiratory effort on room air, crackles heard to mid bases RRR,No Gallops,Rubs or new Murmurs, 2+ pitting edema bilateral lower extremities from feet to mid thighs +ve B.Sounds, Abd Soft, No tenderness, No rebound, guarding or rigidity. No Cyanosis, No new Rash  or bruise    I have personally reviewed the following:   Data Reviewed:  CBC Recent Labs  Lab 05/22/20 1408 05/23/20 0225 05/24/20 0444  WBC 5.8 7.2 6.8  HGB 10.3* 12.2 12.5  HCT 31.4* 36.1 37.2  PLT 76* 89* 76*  MCV 109.4* 104.9* 104.2*  MCH 35.9* 35.5* 35.0*  MCHC 32.8 33.8 33.6  RDW 15.3 15.2 15.0  LYMPHSABS  --   --  1.0  MONOABS  --   --  0.8  EOSABS  --   --  0.1  BASOSABS  --   --  0.0    Chemistries  Recent Labs  Lab 05/22/20 1408 05/23/20 0225 05/23/20 0404 05/24/20 0444  NA 130*  --   --  132*  K 3.6  --   --  3.6  CL 95*  --   --  90*  CO2 23  --   --  25  GLUCOSE 105*  --   --  107*  BUN 17  --   --  14  CREATININE 0.77  --   --  0.82  CALCIUM 8.1*  --   --  9.0  MG 1.7 1.7  --   --   AST  --   --  32 33  ALT  --   --  22 22  ALKPHOS  --   --  72 79  BILITOT  --   --  2.1* 2.2*   ------------------------------------------------------------------------------------------------------------------ No results for input(s): CHOL, HDL, LDLCALC, TRIG, CHOLHDL, LDLDIRECT in the last 72 hours.  Lab Results  Component Value Date   HGBA1C  12/14/2008    5.2 (NOTE) The ADA recommends the following therapeutic goal for glycemic control related to Hgb A1c measurement: Goal of therapy: <6.5 Hgb A1c  Reference: American Diabetes Association: Clinical Practice Recommendations 2010, Diabetes Care, 2010, 33: (Suppl  1).   ------------------------------------------------------------------------------------------------------------------ No results for input(s): TSH, T4TOTAL, T3FREE, THYROIDAB in the last 72 hours.  Invalid input(s): FREET3 ------------------------------------------------------------------------------------------------------------------ Recent Labs    05/23/20 0225  VITAMINB12 2,459*  FOLATE 19.6    Coagulation profile Recent Labs  Lab 05/22/20 1408 05/23/20 0225  INR 1.7* 1.8*    No results for input(s): DDIMER in the last 72  hours.  Cardiac Enzymes No results for input(s): CKMB, TROPONINI, MYOGLOBIN in the last 168 hours.  Invalid input(s): CK ------------------------------------------------------------------------------------------------------------------    Component Value Date/Time   BNP 668.6 (H) 05/22/2020 1409    Micro Results Recent Results (from the past 240 hour(s))  Respiratory Panel by RT PCR (Flu A&B, Covid) - Nasopharyngeal Swab     Status: None   Collection Time: 05/22/20  2:09 PM   Specimen: Nasopharyngeal Swab  Result Value Ref Range Status   SARS  Coronavirus 2 by RT PCR NEGATIVE NEGATIVE Final    Comment: (NOTE) SARS-CoV-2 target nucleic acids are NOT DETECTED.  The SARS-CoV-2 RNA is generally detectable in upper respiratoy specimens during the acute phase of infection. The lowest concentration of SARS-CoV-2 viral copies this assay can detect is 131 copies/mL. A negative result does not preclude SARS-Cov-2 infection and should not be used as the sole basis for treatment or other patient management decisions. A negative result may occur with  improper specimen collection/handling, submission of specimen other than nasopharyngeal swab, presence of viral mutation(s) within the areas targeted by this assay, and inadequate number of viral copies (<131 copies/mL). A negative result must be combined with clinical observations, patient history, and epidemiological information. The expected result is Negative.  Fact Sheet for Patients:  PinkCheek.be  Fact Sheet for Healthcare Providers:  GravelBags.it  This test is no t yet approved or cleared by the [place] FDA and  has been authorized for detection and/or diagnosis of SARS-CoV-2 by FDA under an Emergency Use Authorization (EUA). This EUA will remain  in effect (meaning this test can be used) for the duration of the COVID-19 declaration under Section 564(b)(1) of the Act,  21 U.S.C. section 360bbb-3(b)(1), unless the authorization is terminated or revoked sooner.     Influenza A by PCR NEGATIVE NEGATIVE Final   Influenza B by PCR NEGATIVE NEGATIVE Final    Comment: (NOTE) The Xpert Xpress SARS-CoV-2/FLU/RSV assay is intended as an aid in  the diagnosis of influenza from Nasopharyngeal swab specimens and  should not be used as a sole basis for treatment. Nasal washings and  aspirates are unacceptable for Xpert Xpress SARS-CoV-2/FLU/RSV  testing.  Fact Sheet for Patients: PinkCheek.be  Fact Sheet for Healthcare Providers: GravelBags.it  This test is not yet approved or cleared by the [place] FDA and  has been authorized for detection and/or diagnosis of SARS-CoV-2 by  FDA under an Emergency Use Authorization (EUA). This EUA will remain  in effect (meaning this test can be used) for the duration of the  Covid-19 declaration under Section 564(b)(1) of the Act, 21  U.S.C. section 360bbb-3(b)(1), unless the authorization is  terminated or revoked. Performed at [facility], [place] [address], [place], [place] [address]     Radiology Reports CT Head Wo Contrast  Result Date: 05/15/2020 CLINICAL DATA:  Fall 3 days ago EXAM: CT HEAD WITHOUT CONTRAST TECHNIQUE: Contiguous axial images were obtained from the base of the skull through the vertex without intravenous contrast. COMPARISON:  None. FINDINGS: Brain: No evidence of acute territorial infarction, hemorrhage, hydrocephalus,extra-axial collection or mass lesion/mass effect. There is dilatation the ventricles and sulci consistent with age-related atrophy. Low-attenuation changes in the deep white matter consistent with small vessel ischemia. There appears to be a probable prior lacunar infarct involving the left insular cortex. Vascular: No hyperdense vessel or unexpected calcification. Skull: The skull is intact. No fracture or focal lesion  identified. Sinuses/Orbits: The visualized paranasal sinuses and mastoid air cells are clear. The orbits and globes intact. Other: None Cervical spine: Alignment: There is a mild leftward curvature of the cervical spine. Skull base and vertebrae: Visualized skull base is intact. No atlanto-occipital dissociation. The vertebral body heights are well maintained. No fracture or pathologic osseous lesion seen. Soft tissues and spinal canal: The visualized paraspinal soft tissues are unremarkable. No prevertebral soft tissue swelling is seen. The spinal canal is grossly unremarkable, no large epidural collection or significant canal narrowing. Disc levels: Cervical spine  spondylosis is noted with disc osteophyte complex and uncovertebral osteophytes most notable at C5-C6 and C6-C7 with severe neural foraminal narrowing and mild central canal stenosis. Upper chest: Biapical scarring is seen. Thoracic inlet is within normal limits. Other: None IMPRESSION: No acute intracranial abnormality. Findings consistent with age related atrophy and chronic small vessel ischemia Probable prior lacunar infarct within the left insular cortex. No acute fracture or malalignment of the spine. Electronically Signed   By: Prudencio Pair M.D.   On: 05/15/2020 15:05   CT Cervical Spine Wo Contrast  Result Date: 05/15/2020 CLINICAL DATA:  Fall 3 days ago EXAM: CT HEAD WITHOUT CONTRAST TECHNIQUE: Contiguous axial images were obtained from the base of the skull through the vertex without intravenous contrast. COMPARISON:  None. FINDINGS: Brain: No evidence of acute territorial infarction, hemorrhage, hydrocephalus,extra-axial collection or mass lesion/mass effect. There is dilatation the ventricles and sulci consistent with age-related atrophy. Low-attenuation changes in the deep white matter consistent with small vessel ischemia. There appears to be a probable prior lacunar infarct involving the left insular cortex. Vascular: No hyperdense  vessel or unexpected calcification. Skull: The skull is intact. No fracture or focal lesion identified. Sinuses/Orbits: The visualized paranasal sinuses and mastoid air cells are clear. The orbits and globes intact. Other: None Cervical spine: Alignment: There is a mild leftward curvature of the cervical spine. Skull base and vertebrae: Visualized skull base is intact. No atlanto-occipital dissociation. The vertebral body heights are well maintained. No fracture or pathologic osseous lesion seen. Soft tissues and spinal canal: The visualized paraspinal soft tissues are unremarkable. No prevertebral soft tissue swelling is seen. The spinal canal is grossly unremarkable, no large epidural collection or significant canal narrowing. Disc levels: Cervical spine spondylosis is noted with disc osteophyte complex and uncovertebral osteophytes most notable at C5-C6 and C6-C7 with severe neural foraminal narrowing and mild central canal stenosis. Upper chest: Biapical scarring is seen. Thoracic inlet is within normal limits. Other: None IMPRESSION: No acute intracranial abnormality. Findings consistent with age related atrophy and chronic small vessel ischemia Probable prior lacunar infarct within the left insular cortex. No acute fracture or malalignment of the spine. Electronically Signed   By: Prudencio Pair M.D.   On: 05/15/2020 15:05   DG Chest Portable 1 View  Result Date: 05/22/2020 CLINICAL DATA:  Shortness of breath with lower extremity edema EXAM: PORTABLE CHEST 1 VIEW COMPARISON:  Nov 17, 2016 FINDINGS: There is a small left pleural effusion with airspace opacity in the left base. There is subtle ill-defined opacity in the right mid lung. Lungs elsewhere are clear. There is cardiomegaly with pulmonary vascularity normal. No adenopathy. There is aortic atherosclerosis. No bone lesions. There is mitral annulus calcification. IMPRESSION: Stable cardiomegaly. Left pleural effusion with left lower lobe consolidation,  likely due to combination of atelectasis and pneumonia. Subtle ill-defined opacity right mid lung likely a second focus of pneumonia. Aortic Atherosclerosis (ICD10-I70.0). Electronically Signed   By: Lowella Grip III M.D.   On: 05/22/2020 14:22   ECHOCARDIOGRAM COMPLETE  Result Date: 05/23/2020    ECHOCARDIOGRAM REPORT   Patient Name:   [name] Date of Exam: 05/23/2020 Medical Rec #:  [id number]     Height:       62.0 in Accession #:    [id number]    Weight:       122.8 lb Date of Birth:  [birth year]      BSA:          1.554 m Patient Age:  89 years      BP:           129/67 mmHg Patient Gender: F             HR:           71 bpm. Exam Location:  Inpatient Procedure: 2D Echo, Cardiac Doppler and Color Doppler Indications:    CHF-Acute Systolic 301.60 / F09.32  History:        Patient has prior history of Echocardiogram examinations, most                 recent 09/10/2012. CHF, Arrythmias:Atrial Fibrillation and Atrial                 Flutter, Signs/Symptoms:Shortness of Breath; Risk                 Factors:Non-Smoker. GERD.                  Mitral Valve: prosthetic annuloplasty ring valve is present in                 the mitral position. Procedure Date: 12/2008.  Sonographer:    Vickie Epley RDCS Referring Phys: [id number] OLADAPO ADEFESO IMPRESSIONS  1. Left ventricular ejection fraction, by estimation, is 50 to 55%. The left ventricle has low normal function. The left ventricle has no regional wall motion abnormalities. Left ventricular diastolic parameters are consistent with Grade II diastolic dysfunction (pseudonormalization). There is the interventricular septum is flattened in systole and diastole, consistent with right ventricular pressure and volume overload.  2. Right ventricular systolic function is normal. The right ventricular size is normal. There is severely elevated pulmonary artery systolic pressure. The estimated right ventricular systolic pressure is 02.5 mmHg.  3. Left atrial size was  severely dilated.  4. Right atrial size was severely dilated.  5. The mitral valve has been repaired/replaced. Mild to moderate mitral valve regurgitation. No evidence of mitral stenosis. There is a prosthetic annuloplasty ring present in the mitral position. Procedure Date: 12/2008.  6. The aortic valve is tricuspid. Aortic valve regurgitation is moderate to severe. No aortic stenosis is present.  7. The inferior vena cava is normal in size with greater than 50% respiratory variability, suggesting right atrial pressure of 3 mmHg. Comparison(s): Prior images unable to be directly viewed, comparison made by report only. Prior EF 50-55% in 2014 report. Aortic regurgitation has advanced from trivial. FINDINGS  Left Ventricle: Left ventricular ejection fraction, by estimation, is 50 to 55%. The left ventricle has low normal function. The left ventricle has no regional wall motion abnormalities. The left ventricular internal cavity size was normal in size. There is no left ventricular hypertrophy. The interventricular septum is flattened in systole and diastole, consistent with right ventricular pressure and volume overload. Left ventricular diastolic parameters are consistent with Grade II diastolic dysfunction (pseudonormalization). Right Ventricle: The right ventricular size is normal. No increase in right ventricular wall thickness. Right ventricular systolic function is normal. There is severely elevated pulmonary artery systolic pressure. The tricuspid regurgitant velocity is 3.60 m/s, and with an assumed right atrial pressure of 15 mmHg, the estimated right ventricular systolic pressure is 42.7 mmHg. Left Atrium: Left atrial size was severely dilated. Right Atrium: Right atrial size was severely dilated. Pericardium: There is no evidence of pericardial effusion. Mitral Valve: The mitral valve has been repaired/replaced. Mild to moderate mitral valve regurgitation, with anteriorly-directed jet. There is a  prosthetic annuloplasty ring present in the mitral position.  Procedure Date: 12/2008. No evidence of mitral valve stenosis. MV peak gradient, 12.8 mmHg. The mean mitral valve gradient is 4.0 mmHg. Tricuspid Valve: The tricuspid valve is normal in structure. Tricuspid valve regurgitation is not demonstrated. No evidence of tricuspid stenosis. Aortic Valve: The aortic valve is tricuspid. Aortic valve regurgitation is moderate to severe. Aortic regurgitation PHT measures 518 msec. No aortic stenosis is present. Pulmonic Valve: The pulmonic valve was normal in structure. Pulmonic valve regurgitation is mild to moderate. No evidence of pulmonic stenosis. Aorta: The aortic root is normal in size and structure. Venous: The inferior vena cava is normal in size with greater than 50% respiratory variability, suggesting right atrial pressure of 3 mmHg. IAS/Shunts: No atrial level shunt detected by color flow Doppler.  LEFT VENTRICLE PLAX 2D LVIDd:         3.80 cm     Diastology LVIDs:         2.80 cm     LV e' medial:    3.88 cm/s LV PW:         0.90 cm     LV E/e' medial:  35.6 LV IVS:        0.90 cm     LV e' lateral:   5.80 cm/s LVOT diam:     2.00 cm     LV E/e' lateral: 23.8 LV SV:         52 LV SV Index:   33 LVOT Area:     3.14 cm  LV Volumes (MOD) LV vol d, MOD A2C: 80.4 ml LV vol d, MOD A4C: 77.8 ml LV vol s, MOD A2C: 35.1 ml LV vol s, MOD A4C: 39.6 ml LV SV MOD A2C:     45.3 ml LV SV MOD A4C:     77.8 ml LV SV MOD BP:      41.2 ml RIGHT VENTRICLE RV S prime:     9.38 cm/s TAPSE (M-mode): 1.6 cm LEFT ATRIUM             Index       RIGHT ATRIUM           Index LA diam:        5.50 cm 3.54 cm/m  RA Area:     28.90 cm LA Vol (A2C):   78.0 ml 50.20 ml/m RA Volume:   98.40 ml  63.33 ml/m LA Vol (A4C):   89.2 ml 57.41 ml/m LA Biplane Vol: 85.8 ml 55.22 ml/m  AORTIC VALVE LVOT Vmax:   80.90 cm/s LVOT Vmean:  54.800 cm/s LVOT VTI:    0.165 m AI PHT:      518 msec  AORTA Ao Asc diam: 3.30 cm MITRAL VALVE                 TRICUSPID VALVE MV Area (PHT): 7.16 cm     TR Peak grad:   51.8 mmHg MV Peak grad:  12.8 mmHg    TR Vmax:        360.00 cm/s MV Mean grad:  4.0 mmHg MV Vmax:       1.79 m/s     SHUNTS MV Vmean:      84.4 cm/s    Systemic VTI:  0.16 m MV Decel Time: 106 msec     Systemic Diam: 2.00 cm MR Peak grad: 87.6 mmHg MR Mean grad: 60.0 mmHg MR Vmax:      468.00 cm/s MR Vmean:     362.0 cm/s MV E velocity:  138.00 cm/s MV A velocity: 78.20 cm/s MV E/A ratio:  1.76 Candee Furbish MD Electronically signed by Candee Furbish MD Signature Date/Time: 05/23/2020/11:32:20 AM    Final      Time Spent in minutes  30     Desiree Hane M.D on 05/24/2020 at 1:41 PM  To page go to [email or web address] - password [facility]

## 2020-05-24 NOTE — TOC Progression Note (Signed)
Transition of Care [facility]) - Progression Note    Patient Details  Name: [name] MRN: [id number] Date of Birth: 01/24/1926  Transition of Care [facility]) CM/SW [place], [place] Phone Number: 05/24/2020, 3:15 PM  Clinical Narrative:    CSW spoke with [facility], adv pt will be returning more than likely tomorrow. Number to call report is either [phone] [name]) or [phone]. CSW spoke with pt's son [name], he adv that he will transport pt back to ALF. He has an appointment at 10:45 and will be here afterwards.         Expected Discharge Plan and Services                                                 Social Determinants of Health (SDOH) Interventions    Readmission Risk Interventions No flowsheet data found.

## 2020-05-24 NOTE — Discharge Instructions (Signed)

## 2020-05-24 NOTE — Progress Notes (Signed)
Physical Therapy Treatment Patient Details Name: [name] MRN: [id number] DOB: Nov 09, 1925 Today's Date: 05/24/2020    History of Present Illness 86 yo female presenting with shortness of breath and BLE swelling. PMH including A fib on eliquis, anxiety, breast cancer, CHF, glaucoma, and arthritis.     PT Comments    Pt making steady progress towards her physical therapy goals, exhibiting improved activity tolerance and ambulation distance this session. Ambulating 80 feet with a walker at a min guard assist level. HR 102-124 bpm. D/c plan remains appropriate.     Follow Up Recommendations  Home health PT;Supervision/Assistance - 24 hour     Equipment Recommendations  None recommended by PT    Recommendations for Other Services       Precautions / Restrictions Precautions Precautions: Fall Restrictions Weight Bearing Restrictions: No    Mobility  Bed Mobility Overal bed mobility: Modified Independent             General bed mobility comments: HOB slightly elevated  Transfers Overall transfer level: Needs assistance Equipment used: Rolling walker (2 wheeled) Transfers: Sit to/from Stand Sit to Stand: Min guard;Min assist         General transfer comment: Min guard from edge of bed, light minA from low toilet height  Ambulation/Gait Ambulation/Gait assistance: Min guard Gait Distance (Feet): 80 Feet Assistive device: Rolling walker (2 wheeled) Gait Pattern/deviations: Step-through pattern;Decreased stride length Gait velocity: decreased   General Gait Details: Cues for activity pacing, negotiating around obstacles, min guard for safety   Stairs             Wheelchair Mobility    Modified Rankin (Stroke Patients Only)       Balance Overall balance assessment: Needs assistance Sitting-balance support: No upper extremity supported;Feet supported Sitting balance-Leahy Scale: Good     Standing balance support: Bilateral upper extremity  supported Standing balance-Leahy Scale: Poor                              Cognition Arousal/Alertness: Awake/alert Behavior During Therapy: WFL for tasks assessed/performed Overall Cognitive Status: Within Functional Limits for tasks assessed                                        Exercises General Exercises - Lower Extremity Long Arc Quad: Both;10 reps;Seated Hip Flexion/Marching: Both;10 reps;Seated    General Comments        Pertinent Vitals/Pain Pain Assessment: No/denies pain    Home Living                      Prior Function            PT Goals (current goals can now be found in the care plan section) Acute Rehab PT Goals Patient Stated Goal: go home PT Goal Formulation: With patient Time For Goal Achievement: 06/06/20 Potential to Achieve Goals: Good Progress towards PT goals: Progressing toward goals    Frequency    Min 3X/week      PT Plan Current plan remains appropriate    Co-evaluation              AM-PAC PT "6 Clicks" Mobility   Outcome Measure  Help needed turning from your back to your side while in a flat bed without using bedrails?: None Help needed moving from lying on your  back to sitting on the side of a flat bed without using bedrails?: None Help needed moving to and from a bed to a chair (including a wheelchair)?: A Little Help needed standing up from a chair using your arms (e.g., wheelchair or bedside chair)?: A Little Help needed to walk in hospital room?: A Little Help needed climbing 3-5 steps with a railing? : A Lot 6 Click Score: 19    End of Session Equipment Utilized During Treatment: Gait belt Activity Tolerance: Patient tolerated treatment well Patient left: in chair;with call bell/phone within reach;with chair alarm set Nurse Communication: Mobility status PT Visit Diagnosis: Unsteadiness on feet (R26.81);History of falling (Z91.81)     Time: 1914-7829 PT Time Calculation  (min) (ACUTE ONLY): 12 min  Charges:  $Gait Training: 8-22 mins                     Wyona Almas, PT, DPT Acute Rehabilitation Services Pager [phone] Office [phone]    Deno Etienne 05/24/2020, 8:46 AM

## 2020-05-25 ENCOUNTER — Other Ambulatory Visit (HOSPITAL_COMMUNITY): Payer: Self-pay | Admitting: Internal Medicine

## 2020-05-25 ENCOUNTER — Encounter: Payer: Medicare Other | Admitting: Internal Medicine

## 2020-05-25 DIAGNOSIS — I4892 Unspecified atrial flutter: Secondary | ICD-10-CM

## 2020-05-25 LAB — CBC WITH DIFFERENTIAL/PLATELET
Abs Immature Granulocytes: 0.03 10*3/uL (ref 0.00–0.07)
Basophils Absolute: 0 10*3/uL (ref 0.0–0.1)
Basophils Relative: 0 %
Eosinophils Absolute: 0.1 10*3/uL (ref 0.0–0.5)
Eosinophils Relative: 2 %
HCT: 36.5 % (ref 36.0–46.0)
Hemoglobin: 12.2 g/dL (ref 12.0–15.0)
Immature Granulocytes: 0 %
Lymphocytes Relative: 11 %
Lymphs Abs: 0.8 10*3/uL (ref 0.7–4.0)
MCH: 35 pg — ABNORMAL HIGH (ref 26.0–34.0)
MCHC: 33.4 g/dL (ref 30.0–36.0)
MCV: 104.6 fL — ABNORMAL HIGH (ref 80.0–100.0)
Monocytes Absolute: 1 10*3/uL (ref 0.1–1.0)
Monocytes Relative: 14 %
Neutro Abs: 5.6 10*3/uL (ref 1.7–7.7)
Neutrophils Relative %: 73 %
Platelets: 75 10*3/uL — ABNORMAL LOW (ref 150–400)
RBC: 3.49 MIL/uL — ABNORMAL LOW (ref 3.87–5.11)
RDW: 15.1 % (ref 11.5–15.5)
WBC: 7.6 10*3/uL (ref 4.0–10.5)
nRBC: 0 % (ref 0.0–0.2)

## 2020-05-25 LAB — COMPREHENSIVE METABOLIC PANEL
ALT: 24 U/L (ref 0–44)
AST: 33 U/L (ref 15–41)
Albumin: 3.4 g/dL — ABNORMAL LOW (ref 3.5–5.0)
Alkaline Phosphatase: 80 U/L (ref 38–126)
Anion gap: 14 (ref 5–15)
BUN: 16 mg/dL (ref 8–23)
CO2: 26 mmol/L (ref 22–32)
Calcium: 8.9 mg/dL (ref 8.9–10.3)
Chloride: 91 mmol/L — ABNORMAL LOW (ref 98–111)
Creatinine, Ser: 0.82 mg/dL (ref 0.44–1.00)
GFR, Estimated: >60 mL/min (ref >60)
Glucose, Bld: 117 mg/dL — ABNORMAL HIGH (ref 70–99)
Potassium: 3.4 mmol/L — ABNORMAL LOW (ref 3.5–5.1)
Sodium: 131 mmol/L — ABNORMAL LOW (ref 135–145)
Total Bilirubin: 2.4 mg/dL — ABNORMAL HIGH (ref 0.3–1.2)
Total Protein: 6.3 g/dL — ABNORMAL LOW (ref 6.5–8.1)

## 2020-05-25 LAB — PROCALCITONIN: Procalcitonin: 0.33 ng/mL

## 2020-05-25 MED ORDER — POTASSIUM CHLORIDE CRYS ER 20 MEQ PO TBCR
20.0000 meq | EXTENDED_RELEASE_TABLET | Freq: Every day | ORAL | 0 refills | Status: DC
Start: 2020-05-25 — End: 2020-07-14

## 2020-05-25 MED ORDER — FUROSEMIDE 40 MG PO TABS
40.0000 mg | ORAL_TABLET | Freq: Every day | ORAL | 0 refills | Status: DC
Start: 2020-05-25 — End: 2020-07-15

## 2020-05-25 MED FILL — POTASSIUM CHLORIDE 20meqER: 20 | 30 days supply | Qty: 30 | Fill #0

## 2020-05-25 MED FILL — FUROSEMIDE 40 MG TABLET: 40 | 30 days supply | Qty: 30 | Fill #0

## 2020-05-25 NOTE — Progress Notes (Signed)
D/C instructions given and reviewed. No questions asked but encouraged to call with any concerns.

## 2020-05-25 NOTE — Plan of Care (Signed)

## 2020-05-25 NOTE — Progress Notes (Signed)
Report was given to [employer],  [employer].

## 2020-05-25 NOTE — Discharge Summary (Signed)
Physician Discharge Summary  LISA MILIAN HUD:[id number] DOB: 01/19/1926 DOA: 05/22/2020  PCP: Gayland Curry, DO  Admit date: 05/22/2020 Discharge date: 05/25/2020  Admitted From: Assisted living Disposition: Same  Recommendations for Outpatient Follow-up:  1. Follow up with PCP in 1-2 weeks 2. Please obtain BMP/CBC in one week 3. Please follow up on the following pending results:  Home Health: PT Equipment/Devices: None  Discharge Condition: Stable CODE STATUS: DNR Diet recommendation: As tolerated  Brief/Interim Summary: Ms. Struckman is a 86 year old female with medical history significant for atrial fibrillation on Eliquis and Lopressor who presented to the ED on 11/14 from [facility] with 1 week of increasing shortness of breath and lower leg swelling not improving with recently started Lasix at her facility as she was found to be tachypneic, with BNP of 668, chest x-ray consistent with cardiomegaly and left-sided pleural effusion and started on IV Lasix for presumed diagnosis of congestive heart failure exacerbation. Above patient underwent TTE on 11/15 which was consistent with grade 2 diastolic dysfunction and preserved EF of 50-55%.  Patient admitted as above with acute on chronic CHF exacerbation concerning for medication or lifestyle and dietary noncompliance.  Patient improved drastically on Lasix, now euvolemic, will discharge on furosemide 40 mg p.o. daily with supplemental potassium of 20 MDQ daily, repeat BMP with PCP in 1 week to ensure no worsening creatinine and potassium remain stable.  Patient's chronic comorbid conditions otherwise remained stable, PT recommending home health PT at discharge given ambulatory dysfunction, recent fall and gait instability present prior to admission.  Discharge Diagnoses:  Active Problems:   Hyponatremia   Atrial flutter (HCC)   Acute CHF (congestive heart failure) (HCC)   Macrocytic anemia   Accidental fall   Gait  instability   Elevated brain natriuretic peptide (BNP) level   Acute on chronic diastolic CHF (congestive heart failure) [facility])    Discharge Instructions  Discharge Instructions    (HEART FAILURE PATIENTS) Call MD:  Anytime you have any of the following symptoms: 1) 3 pound weight gain in 24 hours or 5 pounds in 1 week 2) shortness of breath, with or without a dry hacking cough 3) swelling in the hands, feet or stomach 4) if you have to sleep on extra pillows at night in order to breathe.   Complete by: As directed    Call MD for:  difficulty breathing, headache or visual disturbances   Complete by: As directed    Diet - low sodium heart healthy   Complete by: As directed    Increase activity slowly   Complete by: As directed      Allergies as of 05/25/2020      Reactions   Amiodarone Other (See Comments)   sick   Amoxicillin Nausea And Vomiting   Amoxicillin-pot Clavulanate Nausea And Vomiting   Aspirin Other (See Comments)   Unknown... 325 mg and more.   Cheese Other (See Comments)   Per MAR   Ciprofloxacin Other (See Comments)   Flu symptoms   Codeine Nausea And Vomiting   Dairycare [lactase-lactobacillus] Other (See Comments)   Reaction to dairy per [facility]   Diuretic [buchu-cornsilk-ch Grass-hydran] Other (See Comments)   Per Pharmacy record   Iohexol Hives    Code: HIVES, Desc: **11/22/08 **NO REACTION W/ OMNI 300// PT S/P 3 CT SCANS W/ IV CM AND NO HIVES/MMS**PT STATED HIVES 16 YRS AGO. POSSIBLE IV DYE W/ CT/XRAY. MEDICATED W/ 50MG  OF BENEDRYL PO PRIOR TO SCAN, Onset Date: [id number]  Lisinopril Cough   Midazolam Hcl Nausea And Vomiting   Quinolones Other (See Comments)   Flu like symptoms   Sotalol    Unknown   Sulfa Antibiotics Other (See Comments)   Flu like symptoms 'serum sickness'   Thioxanthenes Other (See Comments)   Flu like symptoms 'serum sickness'   Valium Nausea And Vomiting   Vancomycin Other (See Comments)   unknown   Versed [midazolam] Nausea And  Vomiting   Vinegar [acetic Acid] Other (See Comments)   PER MAR      Medication List    TAKE these medications   acetaminophen 500 MG tablet Commonly known as: TYLENOL Take 500 mg by mouth 3 (three) times daily as needed for fever or headache (pain).   Chia Seed Powd Take 1 Scoop by mouth daily. Mix in morning smoothie   D-Mannose Powd Take 5 mLs by mouth daily. Mix in glass of juice   Eliquis 2.5 MG Tabs tablet Generic drug: apixaban Take 2.5 mg by mouth 2 (two) times daily.   estradiol 0.1 MG/GM vaginal cream Commonly known as: ESTRACE Place 1 Applicatorful vaginally See admin instructions. Apply one applicatorful (0.1 mg) vaginally on Tuesday and Friday nights   Fish Oil 1000 MG Caps Take 2,000 mg by mouth daily.   Flax Seeds Powd Take by mouth See admin instructions. Mix 2 spoonfuls in smoothie every morning   furosemide 40 MG tablet Commonly known as: LASIX Take 1 tablet (40 mg total) by mouth daily. Take one tablet (40 mg) by mouth every evening for 3 days - 05/21/2020 thru 05/23/2020 What changed: when to take this   LUTEIN-ZEAXANTHIN PO Take 1 capsule by mouth daily.   metoprolol tartrate 50 MG tablet Commonly known as: LOPRESSOR Take 50 mg by mouth every morning.   metoprolol tartrate 25 MG tablet Commonly known as: LOPRESSOR Take 25 mg by mouth at bedtime.   ondansetron 4 MG tablet Commonly known as: ZOFRAN Take 4 mg by mouth every 6 (six) hours as needed for nausea or vomiting.   OVER THE COUNTER MEDICATION Take 1 capsule by mouth daily. CBD oil   OVER THE COUNTER MEDICATION Take 2 capsules by mouth daily. Arthritis D   OVER THE COUNTER MEDICATION Take 1 tablet by mouth daily as needed (GI upset). Digest Gold   polyethylene glycol 17 g packet Commonly known as: MIRALAX / GLYCOLAX Take 17 g by mouth daily. Mix in water or beverage of choice   potassium chloride SA 20 MEQ tablet Commonly known as: KLOR-CON Take 1 tablet (20 mEq total) by  mouth daily. Take one tablet (20 meq) by mouth every evening for 3 days - 05/21/2020 thru 05/23/2020 What changed: when to take this   PROBIOTIC PO Take 1 capsule by mouth daily.   Senna S 8.6-50 MG tablet Generic drug: senna-docusate Take 1 tablet by mouth daily.   Ubiquinol 100 MG Caps Take 100 mg by mouth daily.   vitamin B-12 500 MCG tablet Commonly known as: CYANOCOBALAMIN Take 500 mcg by mouth daily.   vitamin C 250 MG tablet Commonly known as: ASCORBIC ACID Take 750 mg by mouth daily.       Allergies  Allergen Reactions  . Amiodarone Other (See Comments)    sick  . Amoxicillin Nausea And Vomiting  . Amoxicillin-Pot Clavulanate Nausea And Vomiting  . Aspirin Other (See Comments)    Unknown... 325 mg and more.  Elsie Amis Other (See Comments)    Per MAR  . Ciprofloxacin Other (  See Comments)    Flu symptoms  . Codeine Nausea And Vomiting  . Dairycare [Lactase-Lactobacillus] Other (See Comments)    Reaction to dairy per MAR  . Diuretic [Buchu-Cornsilk-Ch Grass-Hydran] Other (See Comments)    Per Pharmacy record  . Iohexol Hives     Code: HIVES, Desc: **11/22/08 **NO REACTION W/ OMNI 300// PT S/P 3 CT SCANS W/ IV CM AND NO HIVES/MMS**PT STATED HIVES 16 YRS AGO. POSSIBLE IV DYE W/ CT/XRAY. MEDICATED W/ 50MG  OF BENEDRYL PO PRIOR TO SCAN, Onset Date: [id number]   . Lisinopril Cough  . Midazolam Hcl Nausea And Vomiting  . Quinolones Other (See Comments)    Flu like symptoms  . Sotalol     Unknown   . Sulfa Antibiotics Other (See Comments)    Flu like symptoms 'serum sickness'  . Thioxanthenes Other (See Comments)    Flu like symptoms 'serum sickness'  . Valium Nausea And Vomiting  . Vancomycin Other (See Comments)    unknown  . Versed [Midazolam] Nausea And Vomiting  . Vinegar [Acetic Acid] Other (See Comments)    PER MAR    Consultations: None   Procedures/Studies: CT Head Wo Contrast  Result Date: 05/15/2020 CLINICAL DATA:  Fall 3 days ago EXAM: CT  HEAD WITHOUT CONTRAST TECHNIQUE: Contiguous axial images were obtained from the base of the skull through the vertex without intravenous contrast. COMPARISON:  None. FINDINGS: Brain: No evidence of acute territorial infarction, hemorrhage, hydrocephalus,extra-axial collection or mass lesion/mass effect. There is dilatation the ventricles and sulci consistent with age-related atrophy. Low-attenuation changes in the deep white matter consistent with small vessel ischemia. There appears to be a probable prior lacunar infarct involving the left insular cortex. Vascular: No hyperdense vessel or unexpected calcification. Skull: The skull is intact. No fracture or focal lesion identified. Sinuses/Orbits: The visualized paranasal sinuses and mastoid air cells are clear. The orbits and globes intact. Other: None Cervical spine: Alignment: There is a mild leftward curvature of the cervical spine. Skull base and vertebrae: Visualized skull base is intact. No atlanto-occipital dissociation. The vertebral body heights are well maintained. No fracture or pathologic osseous lesion seen. Soft tissues and spinal canal: The visualized paraspinal soft tissues are unremarkable. No prevertebral soft tissue swelling is seen. The spinal canal is grossly unremarkable, no large epidural collection or significant canal narrowing. Disc levels: Cervical spine spondylosis is noted with disc osteophyte complex and uncovertebral osteophytes most notable at C5-C6 and C6-C7 with severe neural foraminal narrowing and mild central canal stenosis. Upper chest: Biapical scarring is seen. Thoracic inlet is within normal limits. Other: None IMPRESSION: No acute intracranial abnormality. Findings consistent with age related atrophy and chronic small vessel ischemia Probable prior lacunar infarct within the left insular cortex. No acute fracture or malalignment of the spine. Electronically Signed   By: Prudencio Pair M.D.   On: 05/15/2020 15:05   CT  Cervical Spine Wo Contrast  Result Date: 05/15/2020 CLINICAL DATA:  Fall 3 days ago EXAM: CT HEAD WITHOUT CONTRAST TECHNIQUE: Contiguous axial images were obtained from the base of the skull through the vertex without intravenous contrast. COMPARISON:  None. FINDINGS: Brain: No evidence of acute territorial infarction, hemorrhage, hydrocephalus,extra-axial collection or mass lesion/mass effect. There is dilatation the ventricles and sulci consistent with age-related atrophy. Low-attenuation changes in the deep white matter consistent with small vessel ischemia. There appears to be a probable prior lacunar infarct involving the left insular cortex. Vascular: No hyperdense vessel or unexpected calcification. Skull: The skull is intact.  No fracture or focal lesion identified. Sinuses/Orbits: The visualized paranasal sinuses and mastoid air cells are clear. The orbits and globes intact. Other: None Cervical spine: Alignment: There is a mild leftward curvature of the cervical spine. Skull base and vertebrae: Visualized skull base is intact. No atlanto-occipital dissociation. The vertebral body heights are well maintained. No fracture or pathologic osseous lesion seen. Soft tissues and spinal canal: The visualized paraspinal soft tissues are unremarkable. No prevertebral soft tissue swelling is seen. The spinal canal is grossly unremarkable, no large epidural collection or significant canal narrowing. Disc levels: Cervical spine spondylosis is noted with disc osteophyte complex and uncovertebral osteophytes most notable at C5-C6 and C6-C7 with severe neural foraminal narrowing and mild central canal stenosis. Upper chest: Biapical scarring is seen. Thoracic inlet is within normal limits. Other: None IMPRESSION: No acute intracranial abnormality. Findings consistent with age related atrophy and chronic small vessel ischemia Probable prior lacunar infarct within the left insular cortex. No acute fracture or malalignment  of the spine. Electronically Signed   By: Prudencio Pair M.D.   On: 05/15/2020 15:05   DG Chest Portable 1 View  Result Date: 05/22/2020 CLINICAL DATA:  Shortness of breath with lower extremity edema EXAM: PORTABLE CHEST 1 VIEW COMPARISON:  Nov 17, 2016 FINDINGS: There is a small left pleural effusion with airspace opacity in the left base. There is subtle ill-defined opacity in the right mid lung. Lungs elsewhere are clear. There is cardiomegaly with pulmonary vascularity normal. No adenopathy. There is aortic atherosclerosis. No bone lesions. There is mitral annulus calcification. IMPRESSION: Stable cardiomegaly. Left pleural effusion with left lower lobe consolidation, likely due to combination of atelectasis and pneumonia. Subtle ill-defined opacity right mid lung likely a second focus of pneumonia. Aortic Atherosclerosis (ICD10-I70.0). Electronically Signed   By: Lowella Grip III M.D.   On: 05/22/2020 14:22   ECHOCARDIOGRAM COMPLETE  Result Date: 05/23/2020    ECHOCARDIOGRAM REPORT   Patient Name:   DACODA SPALLONE Date of Exam: 05/23/2020 Medical Rec #:  [id number]     Height:       62.0 in Accession #:    [id number]    Weight:       122.8 lb Date of Birth:  Oct 11, 1925      BSA:          1.554 m Patient Age:    86 years      BP:           129/67 mmHg Patient Gender: F             HR:           71 bpm. Exam Location:  Inpatient Procedure: 2D Echo, Cardiac Doppler and Color Doppler Indications:    CHF-Acute Systolic 644.03 / K74.25  History:        Patient has prior history of Echocardiogram examinations, most                 recent 09/10/2012. CHF, Arrythmias:Atrial Fibrillation and Atrial                 Flutter, Signs/Symptoms:Shortness of Breath; Risk                 Factors:Non-Smoker. GERD.                  Mitral Valve: prosthetic annuloplasty ring valve is present in                 the mitral position.  Procedure Date: 12/2008.  Sonographer:    Vickie Epley RDCS Referring Phys: [id number] OLADAPO  ADEFESO IMPRESSIONS  1. Left ventricular ejection fraction, by estimation, is 50 to 55%. The left ventricle has low normal function. The left ventricle has no regional wall motion abnormalities. Left ventricular diastolic parameters are consistent with Grade II diastolic dysfunction (pseudonormalization). There is the interventricular septum is flattened in systole and diastole, consistent with right ventricular pressure and volume overload.  2. Right ventricular systolic function is normal. The right ventricular size is normal. There is severely elevated pulmonary artery systolic pressure. The estimated right ventricular systolic pressure is 73.5 mmHg.  3. Left atrial size was severely dilated.  4. Right atrial size was severely dilated.  5. The mitral valve has been repaired/replaced. Mild to moderate mitral valve regurgitation. No evidence of mitral stenosis. There is a prosthetic annuloplasty ring present in the mitral position. Procedure Date: 12/2008.  6. The aortic valve is tricuspid. Aortic valve regurgitation is moderate to severe. No aortic stenosis is present.  7. The inferior vena cava is normal in size with greater than 50% respiratory variability, suggesting right atrial pressure of 3 mmHg. Comparison(s): Prior images unable to be directly viewed, comparison made by report only. Prior EF 50-55% in 2014 report. Aortic regurgitation has advanced from trivial. FINDINGS  Left Ventricle: Left ventricular ejection fraction, by estimation, is 50 to 55%. The left ventricle has low normal function. The left ventricle has no regional wall motion abnormalities. The left ventricular internal cavity size was normal in size. There is no left ventricular hypertrophy. The interventricular septum is flattened in systole and diastole, consistent with right ventricular pressure and volume overload. Left ventricular diastolic parameters are consistent with Grade II diastolic dysfunction (pseudonormalization). Right  Ventricle: The right ventricular size is normal. No increase in right ventricular wall thickness. Right ventricular systolic function is normal. There is severely elevated pulmonary artery systolic pressure. The tricuspid regurgitant velocity is 3.60 m/s, and with an assumed right atrial pressure of 15 mmHg, the estimated right ventricular systolic pressure is 32.9 mmHg. Left Atrium: Left atrial size was severely dilated. Right Atrium: Right atrial size was severely dilated. Pericardium: There is no evidence of pericardial effusion. Mitral Valve: The mitral valve has been repaired/replaced. Mild to moderate mitral valve regurgitation, with anteriorly-directed jet. There is a prosthetic annuloplasty ring present in the mitral position. Procedure Date: 12/2008. No evidence of mitral valve stenosis. MV peak gradient, 12.8 mmHg. The mean mitral valve gradient is 4.0 mmHg. Tricuspid Valve: The tricuspid valve is normal in structure. Tricuspid valve regurgitation is not demonstrated. No evidence of tricuspid stenosis. Aortic Valve: The aortic valve is tricuspid. Aortic valve regurgitation is moderate to severe. Aortic regurgitation PHT measures 518 msec. No aortic stenosis is present. Pulmonic Valve: The pulmonic valve was normal in structure. Pulmonic valve regurgitation is mild to moderate. No evidence of pulmonic stenosis. Aorta: The aortic root is normal in size and structure. Venous: The inferior vena cava is normal in size with greater than 50% respiratory variability, suggesting right atrial pressure of 3 mmHg. IAS/Shunts: No atrial level shunt detected by color flow Doppler.  LEFT VENTRICLE PLAX 2D LVIDd:         3.80 cm     Diastology LVIDs:         2.80 cm     LV e' medial:    3.88 cm/s LV PW:         0.90 cm     LV E/e' medial:  35.6 LV IVS:        0.90 cm     LV e' lateral:   5.80 cm/s LVOT diam:     2.00 cm     LV E/e' lateral: 23.8 LV SV:         52 LV SV Index:   33 LVOT Area:     3.14 cm  LV Volumes (MOD)  LV vol d, MOD A2C: 80.4 ml LV vol d, MOD A4C: 77.8 ml LV vol s, MOD A2C: 35.1 ml LV vol s, MOD A4C: 39.6 ml LV SV MOD A2C:     45.3 ml LV SV MOD A4C:     77.8 ml LV SV MOD BP:      41.2 ml RIGHT VENTRICLE RV S prime:     9.38 cm/s TAPSE (M-mode): 1.6 cm LEFT ATRIUM             Index       RIGHT ATRIUM           Index LA diam:        5.50 cm 3.54 cm/m  RA Area:     28.90 cm LA Vol (A2C):   78.0 ml 50.20 ml/m RA Volume:   98.40 ml  63.33 ml/m LA Vol (A4C):   89.2 ml 57.41 ml/m LA Biplane Vol: 85.8 ml 55.22 ml/m  AORTIC VALVE LVOT Vmax:   80.90 cm/s LVOT Vmean:  54.800 cm/s LVOT VTI:    0.165 m AI PHT:      518 msec  AORTA Ao Asc diam: 3.30 cm MITRAL VALVE                TRICUSPID VALVE MV Area (PHT): 7.16 cm     TR Peak grad:   51.8 mmHg MV Peak grad:  12.8 mmHg    TR Vmax:        360.00 cm/s MV Mean grad:  4.0 mmHg MV Vmax:       1.79 m/s     SHUNTS MV Vmean:      84.4 cm/s    Systemic VTI:  0.16 m MV Decel Time: 106 msec     Systemic Diam: 2.00 cm MR Peak grad: 87.6 mmHg MR Mean grad: 60.0 mmHg MR Vmax:      468.00 cm/s MR Vmean:     362.0 cm/s MV E velocity: 138.00 cm/s MV A velocity: 78.20 cm/s MV E/A ratio:  1.76 Candee Furbish MD Electronically signed by Candee Furbish MD Signature Date/Time: 05/23/2020/11:32:20 AM    Final      Subjective: No acute issues or events overnight, feels quite well denies chest pain, shortness of breath, nausea, vomiting, diarrhea, constipation, headache, fevers, chills.   Discharge Exam: Vitals:   05/25/20 0342 05/25/20 0806  BP: (!) 140/58 111/69  Pulse: 93 100  Resp: 18 16  Temp: 98.3 F (36.8 C) (!) 97.5 F (36.4 C)  SpO2: 99% 95%   Vitals:   05/24/20 2030 05/25/20 0313 05/25/20 0342 05/25/20 0806  BP: 132/71  (!) 140/58 111/69  Pulse: 91  93 100  Resp: 16  18 16   Temp: 98 F (36.7 C)  98.3 F (36.8 C) (!) 97.5 F (36.4 C)  TempSrc: Oral  Oral Oral  SpO2: 93%  99% 95%  Weight:  51.9 kg    Height:        General: Pt is alert, awake, not in acute  distress Cardiovascular: RRR, S1/S2 +, no rubs, no gallops Respiratory: CTA bilaterally, no  wheezing, no rhonchi Abdominal: Soft, NT, ND, bowel sounds + Extremities: no edema, no cyanosis    The results of significant diagnostics from this hospitalization (including imaging, microbiology, ancillary and laboratory) are listed below for reference.     Microbiology: Recent Results (from the past 240 hour(s))  Respiratory Panel by RT PCR (Flu A&B, Covid) - Nasopharyngeal Swab     Status: None   Collection Time: 05/22/20  2:09 PM   Specimen: Nasopharyngeal Swab  Result Value Ref Range Status   SARS Coronavirus 2 by RT PCR NEGATIVE NEGATIVE Final    Comment: (NOTE) SARS-CoV-2 target nucleic acids are NOT DETECTED.  The SARS-CoV-2 RNA is generally detectable in upper respiratoy specimens during the acute phase of infection. The lowest concentration of SARS-CoV-2 viral copies this assay can detect is 131 copies/mL. A negative result does not preclude SARS-Cov-2 infection and should not be used as the sole basis for treatment or other patient management decisions. A negative result may occur with  improper specimen collection/handling, submission of specimen other than nasopharyngeal swab, presence of viral mutation(s) within the areas targeted by this assay, and inadequate number of viral copies (<131 copies/mL). A negative result must be combined with clinical observations, patient history, and epidemiological information. The expected result is Negative.  Fact Sheet for Patients:  PinkCheek.be  Fact Sheet for Healthcare Providers:  GravelBags.it  This test is no t yet approved or cleared by the [place] FDA and  has been authorized for detection and/or diagnosis of SARS-CoV-2 by FDA under an Emergency Use Authorization (EUA). This EUA will remain  in effect (meaning this test can be used) for the duration of  the COVID-19 declaration under Section 564(b)(1) of the Act, 21 U.S.C. section 360bbb-3(b)(1), unless the authorization is terminated or revoked sooner.     Influenza A by PCR NEGATIVE NEGATIVE Final   Influenza B by PCR NEGATIVE NEGATIVE Final    Comment: (NOTE) The Xpert Xpress SARS-CoV-2/FLU/RSV assay is intended as an aid in  the diagnosis of influenza from Nasopharyngeal swab specimens and  should not be used as a sole basis for treatment. Nasal washings and  aspirates are unacceptable for Xpert Xpress SARS-CoV-2/FLU/RSV  testing.  Fact Sheet for Patients: PinkCheek.be  Fact Sheet for Healthcare Providers: GravelBags.it  This test is not yet approved or cleared by the [place] FDA and  has been authorized for detection and/or diagnosis of SARS-CoV-2 by  FDA under an Emergency Use Authorization (EUA). This EUA will remain  in effect (meaning this test can be used) for the duration of the  Covid-19 declaration under Section 564(b)(1) of the Act, 21  U.S.C. section 360bbb-3(b)(1), unless the authorization is  terminated or revoked. Performed at [facility], [place] [address], [place], [place] [address]      Labs: BNP (last 3 results) Recent Labs    05/22/20 1409  BNP 003.7*   Basic Metabolic Panel: Recent Labs  Lab 05/21/20 0000 05/22/20 1408 05/23/20 0225 05/24/20 0444 05/25/20 0248  NA 124* 130*  --  132* 131*  K 4.1 3.6  --  3.6 3.4*  CL 90* 95*  --  90* 91*  CO2 24* 23  --  25 26  GLUCOSE  --  105*  --  107* 117*  BUN 16 17  --  14 16  CREATININE 0.6 0.77  --  0.82 0.82  CALCIUM 9.2 8.1*  --  9.0 8.9  MG  --  1.7 1.7  --   --  PHOS  --   --  3.3  --   --    Liver Function Tests: Recent Labs  Lab 05/23/20 0404 05/24/20 0444 05/25/20 0248  AST 32 33 33  ALT 22 22 24   ALKPHOS 72 79 80  BILITOT 2.1* 2.2* 2.4*  PROT 6.5 6.7 6.3*  ALBUMIN 3.5 3.5 3.4*   No results for input(s):  LIPASE, AMYLASE in the last 168 hours. No results for input(s): AMMONIA in the last 168 hours. CBC: Recent Labs  Lab 05/21/20 0000 05/22/20 1408 05/23/20 0225 05/24/20 0444 05/25/20 0248  WBC 7.1 5.8 7.2 6.8 7.6  NEUTROABS  --   --   --  4.9 5.6  HGB 11.4* 10.3* 12.2 12.5 12.2  HCT 33* 31.4* 36.1 37.2 36.5  MCV  --  109.4* 104.9* 104.2* 104.6*  PLT 94* 76* 89* 76* 75*   Cardiac Enzymes: No results for input(s): CKTOTAL, CKMB, CKMBINDEX, TROPONINI in the last 168 hours. BNP: Invalid input(s): POCBNP CBG: No results for input(s): GLUCAP in the last 168 hours. D-Dimer No results for input(s): DDIMER in the last 72 hours. Hgb A1c No results for input(s): HGBA1C in the last 72 hours. Lipid Profile No results for input(s): CHOL, HDL, LDLCALC, TRIG, CHOLHDL, LDLDIRECT in the last 72 hours. Thyroid function studies No results for input(s): TSH, T4TOTAL, T3FREE, THYROIDAB in the last 72 hours.  Invalid input(s): FREET3 Anemia work up Recent Labs    05/23/20 0225  VITAMINB12 2,459*  FOLATE 19.6   Urinalysis    Component Value Date/Time   COLORURINE YELLOW 12/29/2015 1156   APPEARANCEUR CLOUDY (A) 12/29/2015 1156   LABSPEC 1.015 12/29/2015 1156   PHURINE 6.0 12/29/2015 1156   GLUCOSEU NEGATIVE 12/29/2015 1156   HGBUR SMALL (A) 12/29/2015 1156   BILIRUBINUR NEGATIVE 12/29/2015 1156   KETONESUR NEGATIVE 12/29/2015 1156   PROTEINUR NEGATIVE 12/29/2015 1156   UROBILINOGEN 0.2 06/02/2014 0642   NITRITE POSITIVE (A) 12/29/2015 1156   LEUKOCYTESUR LARGE (A) 12/29/2015 1156   Sepsis Labs Invalid input(s): PROCALCITONIN,  WBC,  LACTICIDVEN Microbiology Recent Results (from the past 240 hour(s))  Respiratory Panel by RT PCR (Flu A&B, Covid) - Nasopharyngeal Swab     Status: None   Collection Time: 05/22/20  2:09 PM   Specimen: Nasopharyngeal Swab  Result Value Ref Range Status   SARS Coronavirus 2 by RT PCR NEGATIVE NEGATIVE Final    Comment: (NOTE) SARS-CoV-2 target  nucleic acids are NOT DETECTED.  The SARS-CoV-2 RNA is generally detectable in upper respiratoy specimens during the acute phase of infection. The lowest concentration of SARS-CoV-2 viral copies this assay can detect is 131 copies/mL. A negative result does not preclude SARS-Cov-2 infection and should not be used as the sole basis for treatment or other patient management decisions. A negative result may occur with  improper specimen collection/handling, submission of specimen other than nasopharyngeal swab, presence of viral mutation(s) within the areas targeted by this assay, and inadequate number of viral copies (<131 copies/mL). A negative result must be combined with clinical observations, patient history, and epidemiological information. The expected result is Negative.  Fact Sheet for Patients:  PinkCheek.be  Fact Sheet for Healthcare Providers:  GravelBags.it  This test is no t yet approved or cleared by the [place] FDA and  has been authorized for detection and/or diagnosis of SARS-CoV-2 by FDA under an Emergency Use Authorization (EUA). This EUA will remain  in effect (meaning this test can be used) for the duration of the COVID-19 declaration  under Section 564(b)(1) of the Act, 21 U.S.C. section 360bbb-3(b)(1), unless the authorization is terminated or revoked sooner.     Influenza A by PCR NEGATIVE NEGATIVE Final   Influenza B by PCR NEGATIVE NEGATIVE Final    Comment: (NOTE) The Xpert Xpress SARS-CoV-2/FLU/RSV assay is intended as an aid in  the diagnosis of influenza from Nasopharyngeal swab specimens and  should not be used as a sole basis for treatment. Nasal washings and  aspirates are unacceptable for Xpert Xpress SARS-CoV-2/FLU/RSV  testing.  Fact Sheet for Patients: PinkCheek.be  Fact Sheet for Healthcare  Providers: GravelBags.it  This test is not yet approved or cleared by the [place] FDA and  has been authorized for detection and/or diagnosis of SARS-CoV-2 by  FDA under an Emergency Use Authorization (EUA). This EUA will remain  in effect (meaning this test can be used) for the duration of the  Covid-19 declaration under Section 564(b)(1) of the Act, 21  U.S.C. section 360bbb-3(b)(1), unless the authorization is  terminated or revoked. Performed at [facility], [place] [address], [place], [place] [address]      Time coordinating discharge: Over 30 minutes  SIGNED:   Little Ishikawa, DO [facility] 05/25/2020, 9:11 AM Pager   If 7PM-7AM, please contact night-coverage [email or web address]

## 2020-05-25 NOTE — NC FL2 (Signed)
[place] LEVEL OF CARE SCREENING TOOL     IDENTIFICATION  Patient Name: [name] Birthdate: 1926/05/27 Sex: female Admission Date (Current Location): 05/22/2020  [facility] and [place] Number:  Herbalist and Address:  [facility] [place], [place] [address] [place], [place] [address]      Provider Number: [id number]  Attending Physician Name and Address:  Little Ishikawa, MD  Relative Name and Phone Number:  Genean Adamski [phone]    Current Level of Care: Hospital Recommended Level of Care: [place] Prior Approval Number:    Date Approved/Denied:   PASRR Number:    Discharge Plan: SNF    Current Diagnoses: Patient Active Problem List   Diagnosis Date Noted  . Acute on chronic diastolic CHF (congestive heart failure) ([place]) 05/23/2020  . Acute CHF (congestive heart failure) ([place]) 05/22/2020  . Macrocytic anemia 05/22/2020  . Accidental fall 05/22/2020  . Gait instability 05/22/2020  . Elevated brain natriuretic peptide (BNP) level 05/22/2020  . Dizziness on standing 04/13/2020  . Mild cognitive impairment with memory loss 04/13/2020  . Recurrent falls 04/13/2020  . Chronic cystitis 04/13/2020  . Atrial flutter ([place]) 05/16/2017  . Mitral valve disease 04/25/2017  . Hyponatremia 03/21/2014  . Normocytic anemia 03/21/2014  . Colitis 03/19/2014  . Knee pain, left anterior 01/20/2014  . S/P mitral valve repair 05/13/2013  . Persistent atrial fibrillation ([place]) 06/19/2012  . SOB (shortness of breath) on exertion 04/28/2012  . GERD (gastroesophageal reflux disease)     Orientation RESPIRATION BLADDER Height & Weight     Self, Time, Situation, Place  Normal Continent, External catheter Weight: 114 lb 6.4 oz (51.9 kg) (scale c) Height:  5\' 2"  (157.5 cm)  BEHAVIORAL SYMPTOMS/MOOD NEUROLOGICAL BOWEL NUTRITION STATUS      Continent Diet (Low sodium heart healthy)  AMBULATORY STATUS COMMUNICATION OF NEEDS  Skin   Limited Assist Verbally Normal                       Personal Care Assistance Level of Assistance  Bathing, Feeding, Dressing Bathing Assistance: Limited assistance Feeding assistance: Independent Dressing Assistance: Limited assistance     Functional Limitations Info  Sight, Hearing, Speech Sight Info: Impaired Hearing Info: Adequate Speech Info: Adequate    SPECIAL CARE FACTORS FREQUENCY                       Contractures Contractures Info: Not present    Additional Factors Info  Code Status, Allergies Code Status Info: DNR Allergies Info: Amiodarone Amoxicillin Amoxicillin-pot Clavulanate Aspirin Cheese Ciprofloxacin Codeine Dairycare(Lactase-lactobacillus) Diuretic (Buchu-cornsilk-ch Grass-hydran) Iohexol Lisinopril Midazolam Hcl Quinolones Sotalol Sulfa Antibiotics Thioxanthenes Valium Vancomycin Versed (Midazolam) Vinegar (Acetic Acid)           Current Medications (05/25/2020):  This is the current hospital active medication list Current Facility-Administered Medications  Medication Dose Route Frequency Provider Last Rate Last Admin  . acetaminophen (TYLENOL) tablet 650 mg  650 mg Oral Q6H PRN Oretha Milch D, MD   650 mg at 05/24/20 1319  . apixaban (ELIQUIS) tablet 2.5 mg  2.5 mg Oral BID Adefeso, Oladapo, DO   2.5 mg at 05/25/20 0808  . furosemide (LASIX) injection 40 mg  40 mg Intravenous Q12H Adefeso, Oladapo, DO   40 mg at 05/25/20 0807  . hydrocortisone cream 0.5 %   Topical TID PRN Oretha Milch D, MD      . metoprolol tartrate (LOPRESSOR)  tablet 25 mg  25 mg Oral QHS Adefeso, Oladapo, DO   25 mg at 05/24/20 2051  . metoprolol tartrate (LOPRESSOR) tablet 50 mg  50 mg Oral q morning - 10a Adefeso, Oladapo, DO   50 mg at 05/25/20 0807  . vitamin B-12 (CYANOCOBALAMIN) tablet 500 mcg  500 mcg Oral Daily Adefeso, Oladapo, DO   500 mcg at 05/25/20 0807     Discharge Medications: Please see discharge summary for a list of discharge  medications.  Relevant Imaging Results:  Relevant Lab Results:   Additional Information SSN  [id number]  Loreta Ave, LCSWA

## 2020-05-25 NOTE — Progress Notes (Signed)
Occupational Therapy Treatment Patient Details Name: [name] MRN: [id number] DOB: 23-Feb-1926 Today's Date: 05/25/2020    History of present illness 86 yo female presenting with shortness of breath and BLE swelling. PMH including A fib on eliquis, anxiety, breast cancer, CHF, glaucoma, and arthritis.    OT comments  Pt progressing well towards OT goals today. Pt does not report dizziness with positional changes or transfers today. Pt able to perform toilet transfer at supervision level with RW (typically uses Rollator) and then complete dressing. Pt demonstrating figure 4 method for access to LB and multiple sit<>stand for dressing. Pt is eager to return home to ALF, very pleasant and motivated.    Follow Up Recommendations  Home health OT;Supervision/Assistance - 24 hour    Equipment Recommendations  None recommended by OT    Recommendations for Other Services      Precautions / Restrictions Precautions Precautions: Fall       Mobility Bed Mobility Overal bed mobility: Modified Independent       Supine to sit: Modified independent (Device/Increase time)     General bed mobility comments: HOB slightly elevated  Transfers Overall transfer level: Needs assistance Equipment used: Rolling walker (2 wheeled) Transfers: Sit to/from Stand Sit to Stand: Min guard         General transfer comment: Min guard from edge of bed, good hand placement with toilet transfer (using grab bars)    Balance Overall balance assessment: Needs assistance Sitting-balance support: No upper extremity supported;Feet supported Sitting balance-Leahy Scale: Good     Standing balance support: Bilateral upper extremity supported Standing balance-Leahy Scale: Poor                             ADL either performed or assessed with clinical judgement   ADL Overall ADL's : Needs assistance/impaired                 Upper Body Dressing : Modified independent;Sitting Upper  Body Dressing Details (indicate cue type and reason): in recliner Lower Body Dressing: Min guard;Sit to/from stand Lower Body Dressing Details (indicate cue type and reason): Pt able to don socks in recliner demonstrating figure 4 method, also donned pants, underwear Toilet Transfer: Supervision/safety;Ambulation;RW Toilet Transfer Details (indicate cue type and reason): Pt typically uses Rollator Toileting- Clothing Manipulation and Hygiene: Supervision/safety;Sit to/from stand       Functional mobility during ADLs: Supervision/safety;Rolling walker       Vision       Perception     Praxis      Cognition Arousal/Alertness: Awake/alert Behavior During Therapy: WFL for tasks assessed/performed Overall Cognitive Status: Within Functional Limits for tasks assessed                                          Exercises     Shoulder Instructions       General Comments      Pertinent Vitals/ Pain       Pain Assessment: No/denies pain  Home Living                                          Prior Functioning/Environment              Frequency  Min  2X/week        Progress Toward Goals  OT Goals(current goals can now be found in the care plan section)  Progress towards OT goals: Progressing toward goals  Acute Rehab OT Goals Patient Stated Goal: go home OT Goal Formulation: With patient Time For Goal Achievement: 06/06/20 Potential to Achieve Goals: Good  Plan Discharge plan remains appropriate    Co-evaluation                 AM-PAC OT "6 Clicks" Daily Activity     Outcome Measure   Help from another person eating meals?: None Help from another person taking care of personal grooming?: A Little Help from another person toileting, which includes using toliet, bedpan, or urinal?: A Little Help from another person bathing (including washing, rinsing, drying)?: A Little Help from another person to put on and taking  off regular upper body clothing?: None Help from another person to put on and taking off regular lower body clothing?: A Little 6 Click Score: 20    End of Session Equipment Utilized During Treatment: Rolling walker  OT Visit Diagnosis: Unsteadiness on feet (R26.81);Other abnormalities of gait and mobility (R26.89);Muscle weakness (generalized) (M62.81)   Activity Tolerance Patient tolerated treatment well   Patient Left in chair;with call bell/phone within reach;with chair alarm set   Nurse Communication Mobility status (fully dressed)        Time: 9449-6759 OT Time Calculation (min): 33 min  Charges: OT General Charges $OT Visit: 1 Visit OT Treatments $Self Care/Home Management : 23-37 mins  [place] Pager: [phone] Office: [place] 05/25/2020, 10:32 AM

## 2020-05-25 NOTE — TOC Transition Note (Signed)
Transition of Care [name]) - CM/SW Discharge Note   Patient Details  Name: [name] MRN: [id number] Date of Birth: 07/11/1925  [facility] CM/SW Contact:  [name], [name] Phone Number: 05/25/2020, 10:23 AM   Clinical Narrative:    Patient will DC to: [facility] Anticipated DC date: 05/25/20 Family notified: [name] Transport by: [name]   Per MD patient ready for DC to [facility]. RN to call report prior to discharge [phone] [name]) or [phone]. RN, patient, patient's family, and facility notified of DC. Discharge Summary and FL2 sent to facility. DC packet on chart. Ambulance transport requested for patient.   CSW will sign off for now as social work intervention is no longer needed. Please consult [place] again if new needs arise.           Patient Goals and CMS Choice        Discharge Placement                       Discharge Plan and Services                                     Social Determinants of Health (SDOH) Interventions     Readmission Risk Interventions No flowsheet data found.

## 2020-05-26 ENCOUNTER — Encounter: Payer: Self-pay | Admitting: Interventional Cardiology

## 2020-05-26 ENCOUNTER — Encounter (INDEPENDENT_AMBULATORY_CARE_PROVIDER_SITE_OTHER): Payer: Medicare Other

## 2020-05-26 DIAGNOSIS — I5033 Acute on chronic diastolic (congestive) heart failure: Secondary | ICD-10-CM | POA: Diagnosis not present

## 2020-05-26 DIAGNOSIS — R296 Repeated falls: Secondary | ICD-10-CM | POA: Diagnosis not present

## 2020-05-26 DIAGNOSIS — R42 Dizziness and giddiness: Secondary | ICD-10-CM

## 2020-05-26 DIAGNOSIS — I4819 Other persistent atrial fibrillation: Secondary | ICD-10-CM

## 2020-05-26 DIAGNOSIS — M6389 Disorders of muscle in diseases classified elsewhere, multiple sites: Secondary | ICD-10-CM | POA: Diagnosis not present

## 2020-05-26 DIAGNOSIS — R2689 Other abnormalities of gait and mobility: Secondary | ICD-10-CM | POA: Diagnosis not present

## 2020-05-26 DIAGNOSIS — G3184 Mild cognitive impairment, so stated: Secondary | ICD-10-CM | POA: Diagnosis not present

## 2020-05-26 DIAGNOSIS — R278 Other lack of coordination: Secondary | ICD-10-CM | POA: Diagnosis not present

## 2020-05-26 DIAGNOSIS — E871 Hypo-osmolality and hyponatremia: Secondary | ICD-10-CM | POA: Diagnosis not present

## 2020-05-27 ENCOUNTER — Other Ambulatory Visit: Payer: Self-pay

## 2020-05-27 ENCOUNTER — Telehealth: Payer: Self-pay | Admitting: *Deleted

## 2020-05-27 ENCOUNTER — Emergency Department (HOSPITAL_COMMUNITY): Payer: Medicare Other

## 2020-05-27 ENCOUNTER — Emergency Department (HOSPITAL_COMMUNITY)
Admission: EM | Admit: 2020-05-27 | Discharge: 2020-05-27 | Disposition: A | Discharge status: Other Acute Inpatient Hospital | Payer: Medicare Other | Attending: Emergency Medicine | Admitting: Emergency Medicine

## 2020-05-27 ENCOUNTER — Encounter (HOSPITAL_COMMUNITY): Payer: Self-pay | Admitting: Emergency Medicine

## 2020-05-27 DIAGNOSIS — B9689 Other specified bacterial agents as the cause of diseases classified elsewhere: Secondary | ICD-10-CM | POA: Diagnosis not present

## 2020-05-27 DIAGNOSIS — W19XXXA Unspecified fall, initial encounter: Secondary | ICD-10-CM | POA: Diagnosis not present

## 2020-05-27 DIAGNOSIS — N3000 Acute cystitis without hematuria: Secondary | ICD-10-CM | POA: Insufficient documentation

## 2020-05-27 DIAGNOSIS — M6389 Disorders of muscle in diseases classified elsewhere, multiple sites: Secondary | ICD-10-CM | POA: Diagnosis not present

## 2020-05-27 DIAGNOSIS — Z853 Personal history of malignant neoplasm of breast: Secondary | ICD-10-CM | POA: Diagnosis not present

## 2020-05-27 DIAGNOSIS — E871 Hypo-osmolality and hyponatremia: Secondary | ICD-10-CM | POA: Diagnosis not present

## 2020-05-27 DIAGNOSIS — G3184 Mild cognitive impairment, so stated: Secondary | ICD-10-CM | POA: Diagnosis not present

## 2020-05-27 DIAGNOSIS — R2689 Other abnormalities of gait and mobility: Secondary | ICD-10-CM | POA: Diagnosis not present

## 2020-05-27 DIAGNOSIS — R930 Abnormal findings on diagnostic imaging of skull and head, not elsewhere classified: Secondary | ICD-10-CM | POA: Insufficient documentation

## 2020-05-27 DIAGNOSIS — R296 Repeated falls: Secondary | ICD-10-CM | POA: Diagnosis not present

## 2020-05-27 DIAGNOSIS — S40022A Contusion of left upper arm, initial encounter: Secondary | ICD-10-CM | POA: Insufficient documentation

## 2020-05-27 DIAGNOSIS — I6789 Other cerebrovascular disease: Secondary | ICD-10-CM | POA: Diagnosis not present

## 2020-05-27 DIAGNOSIS — R29898 Other symptoms and signs involving the musculoskeletal system: Secondary | ICD-10-CM

## 2020-05-27 DIAGNOSIS — Z79899 Other long term (current) drug therapy: Secondary | ICD-10-CM | POA: Insufficient documentation

## 2020-05-27 DIAGNOSIS — J90 Pleural effusion, not elsewhere classified: Secondary | ICD-10-CM | POA: Diagnosis not present

## 2020-05-27 DIAGNOSIS — R278 Other lack of coordination: Secondary | ICD-10-CM | POA: Diagnosis not present

## 2020-05-27 DIAGNOSIS — R29818 Other symptoms and signs involving the nervous system: Secondary | ICD-10-CM | POA: Diagnosis not present

## 2020-05-27 DIAGNOSIS — Z7901 Long term (current) use of anticoagulants: Secondary | ICD-10-CM | POA: Insufficient documentation

## 2020-05-27 DIAGNOSIS — I5033 Acute on chronic diastolic (congestive) heart failure: Secondary | ICD-10-CM | POA: Insufficient documentation

## 2020-05-27 DIAGNOSIS — R531 Weakness: Secondary | ICD-10-CM | POA: Diagnosis not present

## 2020-05-27 DIAGNOSIS — M6281 Muscle weakness (generalized): Secondary | ICD-10-CM | POA: Diagnosis present

## 2020-05-27 LAB — CBG MONITORING, ED: Glucose-Capillary: 112 mg/dL — ABNORMAL HIGH (ref 70–99)

## 2020-05-27 LAB — URINALYSIS, ROUTINE W REFLEX MICROSCOPIC
Bilirubin Urine: NEGATIVE
Glucose, UA: NEGATIVE mg/dL
Hgb urine dipstick: NEGATIVE
Ketones, ur: 5 mg/dL — AB
Nitrite: NEGATIVE
Protein, ur: 100 mg/dL — AB
Specific Gravity, Urine: 1.017 (ref 1.005–1.030)
WBC, UA: >50 WBC/hpf — ABNORMAL HIGH (ref 0–5)
pH: 8 (ref 5.0–8.0)

## 2020-05-27 LAB — BASIC METABOLIC PANEL
Anion gap: 14 (ref 5–15)
BUN: 19 mg/dL (ref 8–23)
CO2: 31 mmol/L (ref 22–32)
Calcium: 9.2 mg/dL (ref 8.9–10.3)
Chloride: 88 mmol/L — ABNORMAL LOW (ref 98–111)
Creatinine, Ser: 0.89 mg/dL (ref 0.44–1.00)
GFR, Estimated: >60 mL/min (ref >60)
Glucose, Bld: 148 mg/dL — ABNORMAL HIGH (ref 70–99)
Potassium: 3.5 mmol/L (ref 3.5–5.1)
Sodium: 133 mmol/L — ABNORMAL LOW (ref 135–145)

## 2020-05-27 LAB — CBC
HCT: 42.1 % (ref 36.0–46.0)
Hemoglobin: 13.9 g/dL (ref 12.0–15.0)
MCH: 36.5 pg — ABNORMAL HIGH (ref 26.0–34.0)
MCHC: 33 g/dL (ref 30.0–36.0)
MCV: 110.5 fL — ABNORMAL HIGH (ref 80.0–100.0)
Platelets: 81 10*3/uL — ABNORMAL LOW (ref 150–400)
RBC: 3.81 MIL/uL — ABNORMAL LOW (ref 3.87–5.11)
RDW: 15.5 % (ref 11.5–15.5)
WBC: 7.9 10*3/uL (ref 4.0–10.5)
nRBC: 0 % (ref 0.0–0.2)

## 2020-05-27 MED ORDER — CEPHALEXIN 500 MG PO CAPS: 500.0000 mg | ORAL_CAPSULE | Freq: Three times a day (TID) | ORAL | 0 refills | Status: AC

## 2020-05-27 MED ORDER — SODIUM CHLORIDE 0.9 % IV SOLN
1.0000 g | Freq: Once | INTRAVENOUS | Status: AC
  Administered 2020-05-27: 1 g via INTRAVENOUS
  Filled 2020-05-27: qty 10

## 2020-05-27 NOTE — Telephone Encounter (Signed)
Received critical [employer] monitor report. 05/26/20 @ 5:37 pm, AFib sustained, rate 80, auto triggered. Pt w/ hx PAF, on Eliquis. Reviewed w/ DOD, Dr. Acie Fredrickson.  No orders received, will continue monitoring.

## 2020-05-27 NOTE — ED Provider Notes (Signed)
[facility] EMERGENCY DEPARTMENT Provider Note   CSN: [id number] Arrival date & time: 05/27/20  1347     History Chief Complaint  Patient presents with  . Weakness    [name] is a 86 y.o. female. With past medical history significant for A. fib on Eliquis, GERD, breast cancer, thrombocytopenia and diastolic heart failure who presents to the emergency department for an evaluation of weakness. Patient states she is currently in rehab after being recently hospitalized for a fall. She gets daily PT at the SNF [facility]). This morning, she woke up with weakness in her left arm. Patient states she has difficulty gripping with the left hand. She received PT in the late mornings and EMS was called to transport patient to the hospital for further evaluation of weakness.  Patient endorsed some tenderness to the left shoulder when she has sustained a bruise from previous fall but denies any arm pain, numbness, tingling, chest pain, dizziness, headaches, shortness of breath, slurred speech, palpitations, trauma or N/V.  HPI   Past Medical History:  Diagnosis Date  . Anxiety   . Arthritis    Some in neck and back, but "can't complain"  . Atypical atrial flutter ([place])   . Breast cancer ([place])   . Cancer [facility])    1998 Right breast cancer with lumpectomy and radiation therapy  . Chronic diastolic CHF (congestive heart failure) ([place])   . Complication of anesthesia    Nausea from versed  . GERD (gastroesophageal reflux disease)   . Glaucoma   . Hiatal hernia   . History of mitral valve repair    Mitral valve prolapse, surgery in 2010  . Hyponatremia   . Normocytic anemia 03/21/2014  . Persistent atrial fibrillation ([place])    s/p MAZE 2010  . Personal history of radiation therapy   . Rheumatic fever    age 28  . Thrombocytopenia [facility])     Patient Active Problem List   Diagnosis Date Noted  . Acute on chronic diastolic CHF (congestive heart failure) ([place]) 05/23/2020   . Acute CHF (congestive heart failure) ([place]) 05/22/2020  . Macrocytic anemia 05/22/2020  . Accidental fall 05/22/2020  . Gait instability 05/22/2020  . Elevated brain natriuretic peptide (BNP) level 05/22/2020  . Dizziness on standing 04/13/2020  . Mild cognitive impairment with memory loss 04/13/2020  . Recurrent falls 04/13/2020  . Chronic cystitis 04/13/2020  . Atrial flutter ([place]) 05/16/2017  . Mitral valve disease 04/25/2017  . Hyponatremia 03/21/2014  . Normocytic anemia 03/21/2014  . Colitis 03/19/2014  . Knee pain, left anterior 01/20/2014  . S/P mitral valve repair 05/13/2013  . Persistent atrial fibrillation ([place]) 06/19/2012    Class: Acute  . SOB (shortness of breath) on exertion 04/28/2012  . GERD (gastroesophageal reflux disease)     Past Surgical History:  Procedure Laterality Date  . ABDOMINAL HYSTERECTOMY    . APPENDECTOMY    . BACK SURGERY     Synovial cyst on spine, removed laproscopically in 2000?  [name] BREAST BIOPSY    . BREAST LUMPECTOMY     right 1998  . BREAST SURGERY     1998 Lumpectomy in right breast  . CARDIAC CATHETERIZATION    . CARDIOVERSION  04/29/2012   Procedure: CARDIOVERSION;  Surgeon: [name], MD;  Location: [facility] ENDOSCOPY;  Service: Cardiovascular;  Laterality: N/A;  . CARDIOVERSION  06/19/2012   Procedure: CARDIOVERSION;  Surgeon: Sinclair Grooms, MD;  Location: [facility];  Service: Cardiovascular;  Laterality: N/A;  h/p from 11/8 in file drawer/dl  . CATARACT EXTRACTION    . CESAREAN SECTION     Three C-Sections  . EYE SURGERY     Cataract Surgery, both eyes  . KIDNEY CYST REMOVAL    . MITRAL VALVE REPAIR  June 2010  . TEE WITHOUT CARDIOVERSION  04/29/2012   Procedure: TRANSESOPHAGEAL ECHOCARDIOGRAM (TEE);  Surgeon: [name], MD;  Location: [facility] ENDOSCOPY;  Service: Cardiovascular;  Laterality: N/A;  . TONSILLECTOMY       OB History   No obstetric history on file.     Family History  Problem Relation Age of  Onset  . Heart disease Mother   . Heart attack Father   . Other Sister   . Brain cancer Sister   . Cancer Maternal Grandfather     Social History   Tobacco Use  . Smoking status: Never Smoker  . Smokeless tobacco: Never Used  Vaping Use  . Vaping Use: Never used  Substance Use Topics  . Alcohol use: No  . Drug use: No    Home Medications Prior to Admission medications   Medication Sig Start Date End Date Taking? Authorizing Provider  acetaminophen (TYLENOL) 500 MG tablet Take 500 mg by mouth 3 (three) times daily as needed for mild pain, fever or headache.    Yes [provider]  apixaban (ELIQUIS) 2.5 MG TABS tablet Take 2.5 mg by mouth 2 (two) times daily.    Yes [provider]  Augusto Garbe POWD Take 1 Scoop by mouth daily as needed (in morning smoothies for health benefits).    Yes [provider]  D-Mannose POWD Take 5 mLs by mouth See admin instructions. Mix 5 ml's of reconstituted powder in a glass of juice daily and drink   Yes [provider]  estradiol (ESTRACE) 0.1 MG/GM vaginal cream Place 1 g vaginally See admin instructions. Apply 1 gram vaginally on Tuesday and Friday nights   Yes [provider]  Flaxseed, Linseed, (FLAX SEEDS) POWD Take by mouth See admin instructions. Mix 2 spoonfuls in smoothie every morning   Yes [provider]  furosemide (LASIX) 40 MG tablet Take 1 tablet (40 mg total) by mouth daily. Take one tablet (40 mg) by mouth every evening for 3 days - 05/21/2020 thru 05/23/2020 Patient taking differently: Take 40 mg by mouth daily.  05/25/20  Yes Little Ishikawa, MD  metoprolol tartrate (LOPRESSOR) 25 MG tablet Take 25 mg by mouth at bedtime.   Yes [provider]  metoprolol tartrate (LOPRESSOR) 50 MG tablet Take 50 mg by mouth every morning.   Yes [provider]  Multiple Vitamins-Minerals (LUTEIN-ZEAXANTHIN) TABS Take 1 capsule by mouth in the morning.   Yes [provider]  NON FORMULARY Take 2 capsules by mouth See admin instructions. Arthri-D3 dietary supplement (glucosamine with key plant extracts)- Take 2 capsules by mouth in the morning   Yes [provider]  Omega-3 Fatty Acids (FISH OIL) 1000 MG CAPS Take 2,000 mg by mouth daily.   Yes [provider]  ondansetron (ZOFRAN) 4 MG tablet Take 4 mg by mouth every 6 (six) hours as needed for nausea or vomiting.    Yes [provider]  polyethylene glycol (MIRALAX / GLYCOLAX) packet Take 17 g by mouth daily. Mix in water or beverage of choice   Yes [provider]  potassium chloride SA (KLOR-CON) 20 MEQ tablet Take 1 tablet (20 mEq total) by mouth  daily. Take one tablet (20 meq) by mouth every evening for 3 days - 05/21/2020 thru 05/23/2020 Patient taking differently: Take 20 mEq by mouth in the morning.  05/25/20  Yes Little Ishikawa, MD  Probiotic Product (PROBIOTIC PO) Take 1 capsule by mouth daily.   Yes [provider]  senna-docusate (SENNA S) 8.6-50 MG tablet Take 1 tablet by mouth See admin instructions. Take 1 tablet by mouth daily and hold for loose stools   Yes [provider]  Ubiquinol 100 MG CAPS Take 100 mg by mouth daily.   Yes [provider]  vitamin B-12 (CYANOCOBALAMIN) 500 MCG tablet Take 500 mcg by mouth daily.   Yes [provider]  vitamin C (ASCORBIC ACID) 250 MG tablet Take 750 mg by mouth daily.   Yes [provider]  OVER THE COUNTER MEDICATION Take 1 tablet by mouth daily as needed (GI upset). Digest Gold    [provider]    Allergies    Amiodarone, Amoxicillin, Amoxicillin-pot clavulanate, Aspirin, Cheese, Ciprofloxacin, Codeine, Dairycare [lactase-lactobacillus], Diuretic [buchu-cornsilk-ch grass-hydran], Fioricet-codeine [butalbital-apap-caff-cod], Guaifenesin & derivatives, Iohexol, Lisinopril, Midazolam hcl, Milk-related compounds, Quinolones, Sotalol, Sulfa antibiotics,  Thioxanthenes, Valium, Vancomycin, Versed [midazolam], and Vinegar [acetic acid]  Review of Systems   Review of Systems  Constitutional: Negative for chills and fever.  Respiratory: Negative for cough.   Cardiovascular: Negative for chest pain and palpitations.  Gastrointestinal: Negative for abdominal pain, nausea and vomiting.  Genitourinary: Negative for difficulty urinating and dysuria.  Musculoskeletal: Positive for myalgias. Negative for back pain and neck pain.  Neurological: Positive for weakness. Negative for dizziness, syncope, numbness and headaches.  Psychiatric/Behavioral: Negative for agitation and confusion.    Physical Exam Updated Vital Signs BP 137/82   Pulse 91   Temp 97.9 F (36.6 C) (Oral)   Resp 19   SpO2 98%   Physical Exam Constitutional:      General: She is not in acute distress.    Appearance: Normal appearance.  HENT:     Head: Normocephalic and atraumatic.     Mouth/Throat:     Mouth: Mucous membranes are moist.     Pharynx: Oropharynx is clear.  Eyes:     Conjunctiva/sclera: Conjunctivae normal.     Pupils: Pupils are equal, round, and reactive to light.  Cardiovascular:     Rate and Rhythm: Normal rate and regular rhythm.     Pulses: Normal pulses.     Heart sounds: Normal heart sounds.  Pulmonary:     Effort: Pulmonary effort is normal. No respiratory distress.     Breath sounds: Normal breath sounds.  Abdominal:     General: Bowel sounds are normal.     Palpations: Abdomen is soft.     Tenderness: There is no abdominal tenderness.  Musculoskeletal:        General: Normal range of motion.     Cervical back: Normal range of motion and neck supple.     Comments: Mild ttp around area of ecchymosis on left upper arm.  Skin:    General: Skin is warm.     Findings: Bruising present.     Comments: Ecchymosis on left upper arm from multiple falls  Neurological:     Mental Status: She is alert and oriented to person, place, and time.      Sensory: No sensory deficit.     Motor: Weakness present.     Comments: Strength 5/5 in RLE, LLE and RUE, 4/5 on LUE. Sensation intact in all  extremities.   Psychiatric:        Mood and Affect: Mood normal.     ED Results / Procedures / Treatments   Labs (all labs ordered are listed, but only abnormal results are displayed) Labs Reviewed  BASIC METABOLIC PANEL - Abnormal; Notable for the following components:      Result Value   Sodium 133 (*)    Chloride 88 (*)    Glucose, Bld 148 (*)    All other components within normal limits  CBC - Abnormal; Notable for the following components:   RBC 3.81 (*)    MCV 110.5 (*)    MCH 36.5 (*)    Platelets 81 (*)    All other components within normal limits  URINALYSIS, ROUTINE W REFLEX MICROSCOPIC - Abnormal; Notable for the following components:   Color, Urine AMBER (*)    APPearance TURBID (*)    Ketones, ur 5 (*)    Protein, ur 100 (*)    Leukocytes,Ua LARGE (*)    WBC, UA >50 (*)    Bacteria, UA MANY (*)    All other components within normal limits  CBG MONITORING, ED - Abnormal; Notable for the following components:   Glucose-Capillary 112 (*)    All other components within normal limits  URINE CULTURE    EKG EKG Interpretation  Date/Time:  Friday May 27 2020 13:47:09 EST Ventricular Rate:  97 PR Interval:    QRS Duration: 86 QT Interval:  338 QTC Calculation: 429 R Axis:   94 Text Interpretation: Atrial fibrillation Rightward axis Nonspecific ST abnormality Abnormal ECG No significant change since last tracing Confirmed by Wandra Arthurs [phone]) on 05/27/2020 4:24:56 PM   Radiology CT Head Wo Contrast  Result Date: 05/27/2020 CLINICAL DATA:  Left hand weakness EXAM: CT HEAD WITHOUT CONTRAST TECHNIQUE: Contiguous axial images were obtained from the base of the skull through the vertex without intravenous contrast. COMPARISON:  None. FINDINGS: Brain: No evidence of acute territorial infarction, hemorrhage,  hydrocephalus,extra-axial collection or mass lesion/mass effect. There is dilatation the ventricles and sulci consistent with age-related atrophy. Low-attenuation changes in the deep white matter consistent with small vessel ischemia. Vascular: No hyperdense vessel or unexpected calcification. Skull: The skull is intact. No fracture or focal lesion identified. Sinuses/Orbits: The visualized paranasal sinuses and mastoid air cells are clear. The orbits and globes intact. Other: None IMPRESSION: No acute intracranial abnormality. Findings consistent with age related atrophy and chronic small vessel ischemia Electronically Signed   By: Prudencio Pair M.D.   On: 05/27/2020 17:06   DG Chest Port 1 View  Result Date: 05/27/2020 CLINICAL DATA:  Fall EXAM: PORTABLE CHEST 1 VIEW COMPARISON:  May 22, 2020 FINDINGS: The heart size and mediastinal contours are mildly enlarged. Aortic knob calcifications are seen. No large airspace consolidation or pleural effusion. Healed rib fractures are seen in the right lower chest. IMPRESSION: No active disease. Electronically Signed   By: Prudencio Pair M.D.   On: 05/27/2020 18:01    Procedures Procedures (including critical care time)  Medications Ordered in ED Medications  cefTRIAXone (ROCEPHIN) 1 g in sodium chloride 0.9 % 100 mL IVPB (0 g Intravenous Stopped 05/27/20 2052)    ED Course  I have reviewed the triage vital signs and the nursing notes.  Pertinent labs & imaging results that were available during my care of the patient were reviewed by me and considered in my medical decision making (see chart for details).    MDM Rules/Calculators/A&P  Patient is a 86 year old female with a history of A. fib on Eliquis presenting to the ED for evaluation of left arm weakness. Patient tachycardic on arrival but hemodynamically stable. Exam significant for 4/5 strength in the right upper extremity but 5/5 in all other extremities with normal  sensation in all extremities.  Labs were unremarkable.  EKG showed stable A. fib. CT head did not show any acute intracranial abnormalities. MRI unable to be done due to patient's heart monitor which she states was placed a few days ago by her cardiologist for symptomatic tachybrady.  UA showed pyuria so patient was given 1 g Rocephin IV for UTI and discharged home with Keflex 500 mg 3 times daily for 5 days. Neurology was consulted for recommendations. Since patient is already on anticoagulation and currently receiving rehab at SNF, they recommended the patient can be safely discharged back to facility. Patient discharged back to [facility] with antibiotics for UTI plans to continue rehab.  Final Clinical Impression(s) / ED Diagnoses Final diagnoses:  Acute cystitis without hematuria  Left arm weakness    Rx / DC Orders ED Discharge Orders    None       Lacinda Axon, MD 05/27/20 2348    Drenda Freeze, MD 05/28/20 [phone]

## 2020-05-27 NOTE — ED Notes (Addendum)
PA Rona Ravens in triage to assess patient at this time, no code stroke called at this time.

## 2020-05-27 NOTE — ED Triage Notes (Signed)
Pt arrives via [facility] from [facility] for c/o L hand weakness per staff, ems advises stroke screen negative for them pt does take eliquis. 128/67, HR 92, 16RR, 99% on room air. Pt ambulatory on scene, a/ox4, L arm appears weak when patient is sitting but during neuro assessment patient able to hold arm up without drift, grip strengths are equal bilaterally. Speech clear, face symmetrical. Pt states her hand feels weak and she thinks it began yesterday. She reports a fall a few weeks ago and does endorse some pain to Hand and forearm since the fall.

## 2020-05-27 NOTE — Progress Notes (Signed)
Pt has temporary heart monitor. Unable to remove, has to be on for 4 weeks and has only been on for 1 week. Pt aware and Dr. Darl Householder aware that we are unable to scan.

## 2020-05-27 NOTE — Telephone Encounter (Signed)
I have made the 1st attempt to contact the patient or family member in charge, in order to follow up from recently being discharged from the hospital. I left a message on voicemail but I will make another attempt at a different time.  

## 2020-05-27 NOTE — Discharge Instructions (Addendum)
[name], you were assessed for your weakness in the ED.  We did a CT scan which did not show any abnormalities.  We were not able to do an MRI because of your heart monitor. You had a UTI that was treated with IV antibiotics.  We are discharging you home with Keflex that you should take for 5 days. Please continue your rehab and follow-up with your primary care doctor if your symptoms do not improve.

## 2020-05-27 NOTE — ED Notes (Signed)
Patient transported to CT 

## 2020-05-27 NOTE — ED Provider Notes (Signed)
[place], RN requested me to screen patient in triage.  Patient here with complaints of left arm weakness, last known normal sometime last night.  She reports she fell on her left shoulder several days prior and suffered a bruise to her upper arm.  She noticed that her left hand is a bit floppy in the last night or this morning without any significant pain.  No complaint of headache or vision changes or neck pain.  On exam patient is alert and oriented x3, cranial nerves are grossly intact, normal grip strength bilaterally, no pronator drift, mild tenderness to left humeral region on palpation.  Speech is clear and face is symmetrical.  Normal finger-to-nose,  and sensation is intact bilaterally.  Patient does have history of atrial fibrillation and currently on Eliquis.  Screening CT scan ordered.  Would not activate code stroke at this time.  MSE was initiated and I personally evaluated the patient and placed orders (if any) at  2:21 PM on May 27, 2020.  The patient appears stable so that the remainder of the MSE may be completed by another provider.    Domenic Moras, PA-C 05/27/20 [place], Joseph, MD 05/30/20 [phone]

## 2020-05-30 ENCOUNTER — Encounter: Payer: Self-pay | Admitting: Interventional Cardiology

## 2020-05-30 ENCOUNTER — Other Ambulatory Visit: Payer: Self-pay

## 2020-05-30 ENCOUNTER — Ambulatory Visit (INDEPENDENT_AMBULATORY_CARE_PROVIDER_SITE_OTHER): Payer: Medicare Other | Admitting: Interventional Cardiology

## 2020-05-30 VITALS — BP 104/60 | HR 82 | Ht 62.5 in | Wt 115.0 lb

## 2020-05-30 DIAGNOSIS — Z9889 Other specified postprocedural states: Secondary | ICD-10-CM

## 2020-05-30 DIAGNOSIS — Z7189 Other specified counseling: Secondary | ICD-10-CM

## 2020-05-30 DIAGNOSIS — Z7901 Long term (current) use of anticoagulants: Secondary | ICD-10-CM

## 2020-05-30 DIAGNOSIS — I4819 Other persistent atrial fibrillation: Secondary | ICD-10-CM

## 2020-05-30 DIAGNOSIS — R55 Syncope and collapse: Secondary | ICD-10-CM

## 2020-05-30 DIAGNOSIS — Z5181 Encounter for therapeutic drug level monitoring: Secondary | ICD-10-CM | POA: Diagnosis not present

## 2020-05-30 DIAGNOSIS — I952 Hypotension due to drugs: Secondary | ICD-10-CM

## 2020-05-30 LAB — URINE CULTURE: Culture: >=100000 — AB

## 2020-05-30 NOTE — Telephone Encounter (Signed)
I have made the 2nd attempt to contact the patient or family member in charge, in order to follow up from recently being discharged from the hospital. I left a message on voicemail but I will make another attempt at a different time.  

## 2020-05-30 NOTE — Patient Instructions (Signed)
Medication Instructions:  Your physician recommends that you continue on your current medications as directed. Please refer to the Current Medication list given to you today.  *If you need a refill on your cardiac medications before your next appointment, please call your pharmacy*   Lab Work: None If you have labs (blood work) drawn today and your tests are completely normal, you will receive your results only by: [name] MyChart Message (if you have MyChart) OR . A paper copy in the mail If you have any lab test that is abnormal or we need to change your treatment, we will call you to review the results.   Testing/Procedures: None   Follow-Up: At [facility], you and your health needs are our priority.  As part of our continuing mission to provide you with exceptional heart care, we have created designated Provider Care Teams.  These Care Teams include your primary Cardiologist (physician) and Advanced Practice Providers (APPs -  Physician Assistants and Nurse Practitioners) who all work together to provide you with the care you need, when you need it.  We recommend signing up for the patient portal called "MyChart".  Sign up information is provided on this After Visit Summary.  MyChart is used to connect with patients for Virtual Visits (Telemedicine).  Patients are able to view lab/test results, encounter notes, upcoming appointments, etc.  Non-urgent messages can be sent to your provider as well.   To learn more about what you can do with MyChart, go to [email or web address].    Your next appointment:   6-8 month(s)  The format for your next appointment:   In Person  Provider:   You may see Sinclair Grooms, MD or one of the following Advanced Practice Providers on your designated Care Team:    Truitt Merle, NP  Cecilie Kicks, NP  Kathyrn Drown, NP    Other Instructions  You have been referred to Neurology. They will be in contact to get you scheduled.

## 2020-05-30 NOTE — Progress Notes (Addendum)
Cardiology Office Note:    Date:  06/01/2020   ID:  [name], DOB Dec 31, 1925, MRN [id number]  PCP:  Gayland Curry, DO  Cardiologist:  Sinclair Grooms, MD   Referring MD: Gayland Curry, DO   Chief Complaint  Patient presents with  . Advice Only    Dizziness    History of Present Illness:    [name] is a 86 y.o. female with a hx of  a hx of mitral valve repair, persistent atrial fibrillation, diastolic heart failure, and chronic anticoagulation therapy.Most recently noted to be in atrial flutter with rapid ventricular response.Persistent atrial flutter with poor rate control has been a problem.  Has been recently having presyncope and is here for evaluation.  [name] is having difficulty with spinning dizziness.  Usually happens when she is up and moving around.  She has had several falls.  This is become debilitating.  Her blood pressures have been relatively low and heart rates at times have been slow in the 50s.  We have decreased metoprolol tartrate to 25 mg twice daily but she is now back on 50 mg a.m. and 25 mg p.m.  A monitor has been ordered for 3 weeks.  She is currently wearing it.  We have gotten no alarms other than one instance where her heart rate increased to 154 brief timeframe on associated with symptoms.  She was seen in the office today and is not orthostatic.  I reviewed a CT scan performed on November 7 and it raises a question of lacunar infarct with also evidence of diffuse microvascular ischemia.  In speaking with her today, her speech is not as fluent and there appears to be some dysarthria.  Also having transient recurring episodes of left arm weakness.  Past Medical History:  Diagnosis Date  . Anxiety   . Arthritis    Some in neck and back, but "can't complain"  . Atypical atrial flutter ([place])   . Breast cancer ([place])   . Cancer [facility])    1998 Right breast cancer with lumpectomy and radiation therapy  . Chronic diastolic CHF (congestive  heart failure) ([place])   . Complication of anesthesia    Nausea from versed  . GERD (gastroesophageal reflux disease)   . Glaucoma   . Hiatal hernia   . History of mitral valve repair    Mitral valve prolapse, surgery in 2010  . Hyponatremia   . Normocytic anemia 03/21/2014  . Persistent atrial fibrillation ([place])    s/p MAZE 2010  . Personal history of radiation therapy   . Rheumatic fever    age 36  . Thrombocytopenia ([place])     Past Surgical History:  Procedure Laterality Date  . ABDOMINAL HYSTERECTOMY    . APPENDECTOMY    . BACK SURGERY     Synovial cyst on spine, removed laproscopically in 2000?  [name] BREAST BIOPSY    . BREAST LUMPECTOMY     right 1998  . BREAST SURGERY     1998 Lumpectomy in right breast  . CARDIAC CATHETERIZATION    . CARDIOVERSION  04/29/2012   Procedure: CARDIOVERSION;  Surgeon: [name], MD;  Location: [facility] ENDOSCOPY;  Service: Cardiovascular;  Laterality: N/A;  . CARDIOVERSION  06/19/2012   Procedure: CARDIOVERSION;  Surgeon: Sinclair Grooms, MD;  Location: [facility] ENDOSCOPY;  Service: Cardiovascular;  Laterality: N/A;  h/p from 11/8 in file drawer/dl  . CATARACT EXTRACTION    . CESAREAN SECTION  Three C-Sections  . EYE SURGERY     Cataract Surgery, both eyes  . KIDNEY CYST REMOVAL    . MITRAL VALVE REPAIR  June 2010  . TEE WITHOUT CARDIOVERSION  04/29/2012   Procedure: TRANSESOPHAGEAL ECHOCARDIOGRAM (TEE);  Surgeon: [name], MD;  Location: [facility] ENDOSCOPY;  Service: Cardiovascular;  Laterality: N/A;  . TONSILLECTOMY      Current Medications: Current Meds  Medication Sig  . acetaminophen (TYLENOL) 500 MG tablet Take 500 mg by mouth 3 (three) times daily as needed for mild pain, fever or headache.   [name] apixaban (ELIQUIS) 2.5 MG TABS tablet Take 2.5 mg by mouth 2 (two) times daily.   . cephALEXin (KEFLEX) 500 MG capsule Take 1 capsule (500 mg total) by mouth 3 (three) times daily for 5 days.  Christiane Ha Seed POWD Take 1 Scoop by mouth daily as  needed (in morning smoothies for health benefits).   . D-Mannose POWD Take 5 mLs by mouth See admin instructions. Mix 5 ml's of reconstituted powder in a glass of juice daily and drink  . estradiol (ESTRACE) 0.1 MG/GM vaginal cream Place 1 g vaginally See admin instructions. Apply 1 gram vaginally on Tuesday and Friday nights  . Flaxseed, Linseed, (FLAX SEEDS) POWD Take by mouth See admin instructions. Mix 2 spoonfuls in smoothie every morning  . furosemide (LASIX) 40 MG tablet Take 1 tablet (40 mg total) by mouth daily. Take one tablet (40 mg) by mouth every evening for 3 days - 05/21/2020 thru 05/23/2020  . metoprolol tartrate (LOPRESSOR) 25 MG tablet Take 25 mg by mouth daily. Take 25 mg PO at bedtime for CHF  . metoprolol tartrate (LOPRESSOR) 50 MG tablet Take 50 mg by mouth daily. Special Instruction: For CHF Once a morning; 8:00am- 9:00 am  . Multiple Vitamins-Minerals (LUTEIN-ZEAXANTHIN) TABS Take 1 capsule by mouth in the morning.  . NON FORMULARY Take 2 capsules by mouth See admin instructions. Arthri-D3 dietary supplement (glucosamine with key plant extracts)- Take 2 capsules by mouth in the morning  . Omega-3 Fatty Acids (FISH OIL) 1000 MG CAPS Take 2,000 mg by mouth daily.  . ondansetron (ZOFRAN) 4 MG tablet Take 4 mg by mouth every 6 (six) hours as needed for nausea or vomiting.   [name] OVER THE COUNTER MEDICATION Take 1 tablet by mouth daily as needed (GI upset). Digest Gold  . polyethylene glycol (MIRALAX / GLYCOLAX) packet Take 17 g by mouth daily. Mix in water or beverage of choice  . potassium chloride SA (KLOR-CON) 20 MEQ tablet Take 1 tablet (20 mEq total) by mouth daily. Take one tablet (20 meq) by mouth every evening for 3 days - 05/21/2020 thru 05/23/2020  . Probiotic Product (PROBIOTIC PO) Take 1 capsule by mouth daily.  [name] senna-docusate (SENNA S) 8.6-50 MG tablet Take 1 tablet by mouth See admin instructions. Take 1 tablet by mouth daily and hold for loose stools  . Ubiquinol  100 MG CAPS Take 100 mg by mouth daily.  . vitamin B-12 (CYANOCOBALAMIN) 500 MCG tablet Take 500 mcg by mouth daily.  . vitamin C (ASCORBIC ACID) 250 MG tablet Take 750 mg by mouth daily.  . [DISCONTINUED] metoprolol tartrate (LOPRESSOR) 25 MG tablet Take 25 mg by mouth 2 (two) times daily.     Allergies:   Amiodarone, Amoxicillin, Amoxicillin-pot clavulanate, Aspirin, Cheese, Ciprofloxacin, Codeine, Dairycare [lactase-lactobacillus], Diuretic [buchu-cornsilk-ch grass-hydran], Fioricet-codeine [butalbital-apap-caff-cod], Guaifenesin & derivatives, Iohexol, Lisinopril, Midazolam hcl, Milk-related compounds, Quinolones, Sotalol, Sulfa antibiotics, Thioxanthenes, Valium, Vancomycin, Versed [midazolam],  and Vinegar [acetic acid]   Social History   Socioeconomic History  . Marital status: Widowed    Spouse name: Not on file  . Number of children: Not on file  . Years of education: Not on file  . Highest education level: Not on file  Occupational History  . Not on file  Tobacco Use  . Smoking status: Never Smoker  . Smokeless tobacco: Never Used  Vaping Use  . Vaping Use: Never used  Substance and Sexual Activity  . Alcohol use: No  . Drug use: No  . Sexual activity: Never    Comment: Smoked one cigarette a day as a teen  Other Topics Concern  . Not on file  Social History Narrative   Pt lives in [place] alone.  Homemaker.  Widowed   Social Determinants of Radio broadcast assistant Strain:   . Difficulty of Paying Living Expenses: Not on file  Food Insecurity:   . Worried About Charity fundraiser in the Last Year: Not on file  . Ran Out of Food in the Last Year: Not on file  Transportation Needs:   . Lack of Transportation (Medical): Not on file  . Lack of Transportation (Non-Medical): Not on file  Physical Activity:   . Days of Exercise per Week: Not on file  . Minutes of Exercise per Session: Not on file  Stress:   . Feeling of Stress : Not on file  Social  Connections:   . Frequency of Communication with Friends and Family: Not on file  . Frequency of Social Gatherings with Friends and Family: Not on file  . Attends Religious Services: Not on file  . Active Member of Clubs or Organizations: Not on file  . Attends Archivist Meetings: Not on file  . Marital Status: Not on file     Family History: The patient's family history includes Brain cancer in her sister; Cancer in her maternal grandfather; Heart attack in her father; Heart disease in her mother; Other in her sister.  ROS:   Please see the history of present illness.    She is frustrated about her weight and she feels.  She is concerned about falling.  The episodes of spinning can be associated with nausea.  She is wearing a monitor.  We have not gotten any live alerts.  All other systems reviewed and are negative.  EKGs/Labs/Other Studies Reviewed:    The following studies were reviewed today: 2D Doppler echocardiogram performed May 23, 2020: IMPRESSIONS    1. Left ventricular ejection fraction, by estimation, is 50 to 55%. The  left ventricle has low normal function. The left ventricle has no regional  wall motion abnormalities. Left ventricular diastolic parameters are  consistent with Grade II diastolic  dysfunction (pseudonormalization). There is the interventricular septum is  flattened in systole and diastole, consistent with right ventricular  pressure and volume overload.  2. Right ventricular systolic function is normal. The right ventricular  size is normal. There is severely elevated pulmonary artery systolic  pressure. The estimated right ventricular systolic pressure is 07.3 mmHg.  3. Left atrial size was severely dilated.  4. Right atrial size was severely dilated.  5. The mitral valve has been repaired/replaced. Mild to moderate mitral  valve regurgitation. No evidence of mitral stenosis. There is a prosthetic  annuloplasty ring present in  the mitral position. Procedure Date: 12/2008.  6. The aortic valve is tricuspid. Aortic valve regurgitation is moderate  to severe. No  aortic stenosis is present.  7. The inferior vena cava is normal in size with greater than 50%  respiratory variability, suggesting right atrial pressure of 3 mmHg.   Comparison(s): Prior images unable to be directly viewed, comparison made  by report only. Prior EF 50-55% in 2014 report. Aortic regurgitation has  advanced from trivial.   EKG:  EKG performed May 27, 2020 demonstrates atrial fibrillation at a rate of 97 bpm and otherwise unremarkable.  Recent Labs: 05/22/2020: B Natriuretic Peptide 668.6 05/23/2020: Magnesium 1.7 05/25/2020: ALT 24 05/27/2020: BUN 19; Creatinine, Ser 0.89; Hemoglobin 13.9; Platelets 81; Potassium 3.5; Sodium 133  Recent Lipid Panel No results found for: CHOL, TRIG, HDL, CHOLHDL, VLDL, LDLCALC, LDLDIRECT  Physical Exam:    VS:  BP 104/60   Pulse 82   Ht 5' 2.5" (1.588 m)   Wt 115 lb (52.2 kg)   SpO2 97%   BMI 20.70 kg/m     Wt Readings from Last 3 Encounters:  05/31/20 111 lb 9.6 oz (50.6 kg)  05/30/20 115 lb (52.2 kg)  05/27/20 112 lb (50.8 kg)    Orthostatic Blood pressure Recording: Blood pressure sitting: 104/60 mmHg heart rate 80 bpm.                                                                       Blood pressure standing 113/60 mmHg heart rate 88 bpm GEN: Somewhat pale appearing. No acute distress HEENT: Normal NECK: No JVD. LYMPHATICS: No lymphadenopathy CARDIAC: Irregularly irregular RR without murmur, gallop, or edema. VASCULAR:  Normal Pulses. No bruits. RESPIRATORY:  Clear to auscultation without rales, wheezing or rhonchi  ABDOMEN: Soft, non-tender, non-distended, No pulsatile mass, MUSCULOSKELETAL: No deformity  SKIN: Warm and dry NEUROLOGIC: Speech is not as fluent as usual.  Some dysarthria. PSYCHIATRIC:  Normal affect   ASSESSMENT:    1. Near syncope   2. Persistent atrial  fibrillation ([place])   3. S/P mitral valve repair   4. Anticoagulation goal of INR 2 to 3   5. Educated about COVID-19 virus infection   6. Hypotension due to drugs    PLAN:    In order of problems listed above:  1. Episodes of dizziness seem to be related to vertigo.  That along with transient recurring episodes of left arm weakness raises a question of neurological etiology.  She needs a neurology consultation.  I do not believe the episodes of "dizziness" are related to her heart.  We will have her to remove the monitor today. 2. The monitor will help determine rate control in atrial fibrillation. 3. Cardiac exam is unchanged.  No murmur is heard. 4. Continue anticoagulation as long as she can be safely monitored relative to fall risk. 5. She is vaccinated.  Continue social mitigation. 6. The blood pressure is relatively low for her age. She is on metoprolol 50 mg a.m. and 25 mg p.m. We will see what the monitor demonstrates. If rate control is not good, may need to add digoxin. Further decreasing metoprolol dose may help.  13-month follow-up with cardiology.  We may make a further adjustment of metoprolol depending on the monitor results.   Medication Adjustments/Labs and Tests Ordered: Current medicines are reviewed at length with the patient today.  Concerns regarding medicines are outlined above.  Orders Placed This Encounter  Procedures  . Ambulatory referral to Neurology   No orders of the defined types were placed in this encounter.   Patient Instructions  Medication Instructions:  Your physician recommends that you continue on your current medications as directed. Please refer to the Current Medication list given to you today.  *If you need a refill on your cardiac medications before your next appointment, please call your pharmacy*   Lab Work: None If you have labs (blood work) drawn today and your tests are completely normal, you will receive your results only  by: [name] MyChart Message (if you have MyChart) OR . A paper copy in the mail If you have any lab test that is abnormal or we need to change your treatment, we will call you to review the results.   Testing/Procedures: None   Follow-Up: At [facility], you and your health needs are our priority.  As part of our continuing mission to provide you with exceptional heart care, we have created designated Provider Care Teams.  These Care Teams include your primary Cardiologist (physician) and Advanced Practice Providers (APPs -  Physician Assistants and Nurse Practitioners) who all work together to provide you with the care you need, when you need it.  We recommend signing up for the patient portal called "MyChart".  Sign up information is provided on this After Visit Summary.  MyChart is used to connect with patients for Virtual Visits (Telemedicine).  Patients are able to view lab/test results, encounter notes, upcoming appointments, etc.  Non-urgent messages can be sent to your provider as well.   To learn more about what you can do with MyChart, go to [email or web address].    Your next appointment:   6-8 month(s)  The format for your next appointment:   In Person  Provider:   You may see Sinclair Grooms, MD or one of the following Advanced Practice Providers on your designated Care Team:    Truitt Merle, NP  Cecilie Kicks, NP  Kathyrn Drown, NP    Other Instructions  You have been referred to Neurology. They will be in contact to get you scheduled.     Signed, Sinclair Grooms, MD  06/01/2020 8:40 AM    [place]

## 2020-05-31 ENCOUNTER — Non-Acute Institutional Stay (SKILLED_NURSING_FACILITY): Payer: Medicare Other | Admitting: Internal Medicine

## 2020-05-31 ENCOUNTER — Telehealth: Payer: Self-pay | Admitting: Interventional Cardiology

## 2020-05-31 ENCOUNTER — Encounter: Payer: Self-pay | Admitting: Internal Medicine

## 2020-05-31 DIAGNOSIS — G9340 Encephalopathy, unspecified: Secondary | ICD-10-CM

## 2020-05-31 DIAGNOSIS — N3000 Acute cystitis without hematuria: Secondary | ICD-10-CM

## 2020-05-31 DIAGNOSIS — I495 Sick sinus syndrome: Secondary | ICD-10-CM

## 2020-05-31 DIAGNOSIS — I4819 Other persistent atrial fibrillation: Secondary | ICD-10-CM | POA: Diagnosis not present

## 2020-05-31 DIAGNOSIS — R2689 Other abnormalities of gait and mobility: Secondary | ICD-10-CM | POA: Diagnosis not present

## 2020-05-31 DIAGNOSIS — R410 Disorientation, unspecified: Secondary | ICD-10-CM

## 2020-05-31 DIAGNOSIS — Z66 Do not resuscitate: Secondary | ICD-10-CM | POA: Diagnosis not present

## 2020-05-31 DIAGNOSIS — R42 Dizziness and giddiness: Secondary | ICD-10-CM | POA: Diagnosis not present

## 2020-05-31 DIAGNOSIS — D6869 Other thrombophilia: Secondary | ICD-10-CM | POA: Diagnosis not present

## 2020-05-31 DIAGNOSIS — G3184 Mild cognitive impairment, so stated: Secondary | ICD-10-CM | POA: Diagnosis not present

## 2020-05-31 DIAGNOSIS — I5032 Chronic diastolic (congestive) heart failure: Secondary | ICD-10-CM | POA: Diagnosis not present

## 2020-05-31 DIAGNOSIS — R278 Other lack of coordination: Secondary | ICD-10-CM | POA: Diagnosis not present

## 2020-05-31 DIAGNOSIS — M6389 Disorders of muscle in diseases classified elsewhere, multiple sites: Secondary | ICD-10-CM | POA: Diagnosis not present

## 2020-05-31 DIAGNOSIS — R296 Repeated falls: Secondary | ICD-10-CM | POA: Diagnosis not present

## 2020-05-31 DIAGNOSIS — R531 Weakness: Secondary | ICD-10-CM | POA: Diagnosis not present

## 2020-05-31 DIAGNOSIS — R0600 Dyspnea, unspecified: Secondary | ICD-10-CM | POA: Diagnosis not present

## 2020-05-31 DIAGNOSIS — E782 Mixed hyperlipidemia: Secondary | ICD-10-CM | POA: Diagnosis not present

## 2020-05-31 DIAGNOSIS — E871 Hypo-osmolality and hyponatremia: Secondary | ICD-10-CM | POA: Diagnosis not present

## 2020-05-31 NOTE — Addendum Note (Signed)
Addended by: Loren Racer on: 05/31/2020 03:39 PM   Modules accepted: Orders

## 2020-05-31 NOTE — Telephone Encounter (Signed)
[name] is calling with [name] stating she is confused as to when she is to take off her heart monitor. She states Dr. Tamala Julian advised her she no longer needs to wear it, but [facility] stated Dr. Tamala Julian is wanting her to wear it for another 10 days. Please advise.

## 2020-05-31 NOTE — Telephone Encounter (Signed)
I have made the 3rd attempt to contact the patient or family member in charge, in order to follow up from recently being discharged from the hospital. Cannot leave message.

## 2020-05-31 NOTE — Telephone Encounter (Addendum)
Received medication list from [facility] yesterday at 2319.  Updated pt's med list.  Pt is currently taking Metoprolol Tartrate 50mg  AM and 25mg  PM.  At visit yesterday it was believed that she was taking 25mg  BID.  Will route to Dr. Tamala Julian to make him aware.  Pt was changed back to 50/25 dosing prior to going to the hospital on 11/14.  Pt currently wearing a monitor but was told yesterday ok to take it off.  I did speak with pt (see other phone note) and told her to keep it on for now until I could discuss with Dr. Tamala Julian since her medications are different than we thought.

## 2020-05-31 NOTE — Progress Notes (Signed)
Provider:  Rexene [name]. Mariea Clonts, D.O., C.M.D. Location:  [facility] Room Number: 155 Place of Service:  ALF [phone]  PCP: Tara Curry, DO Patient Care Team: Tara Curry, DO as PCP - General (Geriatric Medicine) Belva Crome, MD as PCP - Cardiology (Cardiology) Constance Haw, MD as PCP - Electrophysiology (Cardiology)  Extended Emergency Contact Information Primary Emergency Contact: [name] Address: [address]          [place] Phone: [phone] Mobile Phone: [phone] Relation: Son Secondary Emergency Contact: [name] [place] Phone: [phone] Mobile Phone: [phone] Relation: Son  Code Status: DNR Goals of Care: Advanced Directive information Advanced Directives 05/31/2020  Does Patient Have a Medical Advance Directive? Yes  Type of Advance Directive Out of facility DNR (pink MOST or yellow form)  Does patient want to make changes to medical advance directive? No - Patient declined  Copy of [place] in Chart? -  Would patient like information on creating a medical advance directive? -  Pre-existing out of facility DNR order (yellow form or pink MOST form) Pink MOST form placed in chart (order not valid for inpatient use)   Chief Complaint  Patient presents with  . Readmit To SNF    Readmit to Rehab    HPI: Patient is a 86 y.o. female seen today for readmission to [facility] rehab s/p hospitalization 11/14-17 with shortness of breath, edema, weakness.  [name] has a PMH significant for afib on eliquis, cognitive impairment, anxiety who had recently moved to [place] after a rehab stay.  She'd gotten stronger in rehab; however, [place] got [place] had 2 falls 11/5 and another 11/7 where she had a negative CT brain and spine.  She was sent out on 11/14 with increasing sob on exertion and edema.  I'd seen her 4  days before when she was in rapid afib and we'd given her another extra dose of her beta blocker that had ben reduced due to dizziness and hypotension to address her heart rate.  She apparently had only minimal improvement.  In the ED, she had tachypnea, BNP 668.6 (down from 1067 at [facility]), CXR with stable cardiomegaly and left pleural effusion and possible consolidation.  She was given IV lasix 40mg  x 1 and potassium, then admitted by the hospitalist team.  EKG showed atrial flutter at 77 there with prolonged QTc of 560ms.  She also had mild hyponatremia likely due to volume overload. She underwent a TTE on 11/15 that showed grade 2 diastolic dysfunction and preserved EF of 50-55%.  She improved drastically on lasix to euvolemia and was sent back on lasix 40mg  daily and 71meq potassium daily.  Repeat bmp was requested at one week.  PT recommended HH PT in her AL apt.    11/18, her Preventice monitor triggered and showed sustained afib at 78 and Dr. Acie Fredrickson was notified.  (she'd already been in fib or flutter prior to this at high rates prior to and during her hospitalization).  Unfortunately, on 11/19, she was sent back to the hospital with weakness of her left arm.  She had woken up with decreased grip strength of the left hand, continued unsteady gait, abnormal finger-to-nose testing and heel-to-shin testing.  She had a CT brain that revealed findings consistent with age related atrophy and small vessel ischemia but no acute infarct.  She could not have the MRI due to  the loop recorder in place.  She wanted to go back to [facility] so she was not admitted.  EKG in ED then showed afib with rightward axis, no ST abnormality and HR of 97.  UA showed pyuria so she was given rocephin for uti and d/c'd on keflex 500mg  po tid for 5 days.  Neurology was consulted and she's to f/u with them outpatient.  When she returned, we determined it was more appropriate for her to go to rehab than back to her AL room so she could be  monitored more closely after these two hospital visits.  She was a bit more confused than prior to admission.  Nursing had noticed patient was less aware of her left arm and pt notes it to be somewhat flaccid.  Strength in the left arm has returned to baseline.  The other cerebellar signs remained abnormal/coordination.  She notes no sensation difference.  She continues to be quite unsteady and keeps trying to go about her room w/o her walker.    In terms of sob and edema, this remains improved.  Intake has been poor for some time now and is not any better now.  She still feels weak and mostly extremely fatigued.  She had worked with therapy this morning right at the time when she was ready to take a nap.  Her blood pressures are remaining low at times so her metoprolol has had to be held today (104/51, so now HR 80s to 90s).  Labs were drawn this am, but [facility]bot[facility] did not get enough blood to run cbc so they are returning in am.     She has no acute urinary symptoms and is completing her abx course.    She's had itching of her skin which is quite dry.  This is not new.    Past Medical History:  Diagnosis Date  . Anxiety   . Arthritis    Some in neck and back, but "can't complain"  . Atypical atrial flutter ([place])   . Breast cancer ([place])   . Cancer [facility])    1998 Right breast cancer with lumpectomy and radiation therapy  . Chronic diastolic CHF (congestive heart failure) ([place])   . Complication of anesthesia    Nausea from versed  . GERD (gastroesophageal reflux disease)   . Glaucoma   . Hiatal hernia   . History of mitral valve repair    Mitral valve prolapse, surgery in 2010  . Hyponatremia   . Normocytic anemia 03/21/2014  . Persistent atrial fibrillation ([place])    s/p MAZE 2010  . Personal history of radiation therapy   . Rheumatic fever    age 15  . Thrombocytopenia ([place])    Past Surgical History:  Procedure Laterality Date  . ABDOMINAL HYSTERECTOMY    . APPENDECTOMY     . BACK SURGERY     Synovial cyst on spine, removed laproscopically in 2000?  [name] BREAST BIOPSY    . BREAST LUMPECTOMY     right 1998  . BREAST SURGERY     1998 Lumpectomy in right breast  . CARDIAC CATHETERIZATION    . CARDIOVERSION  04/29/2012   Procedure: CARDIOVERSION;  Surgeon: [name], MD;  Location: [facility] ENDOSCOPY;  Service: Cardiovascular;  Laterality: N/A;  . CARDIOVERSION  06/19/2012   Procedure: CARDIOVERSION;  Surgeon: Sinclair Grooms, MD;  Location: [facility] ENDOSCOPY;  Service: Cardiovascular;  Laterality: N/A;  h/p from 11/8 in file drawer/dl  . CATARACT EXTRACTION    .  CESAREAN SECTION     Three C-Sections  . EYE SURGERY     Cataract Surgery, both eyes  . KIDNEY CYST REMOVAL    . MITRAL VALVE REPAIR  June 2010  . TEE WITHOUT CARDIOVERSION  04/29/2012   Procedure: TRANSESOPHAGEAL ECHOCARDIOGRAM (TEE);  Surgeon: [name], MD;  Location: [facility] ENDOSCOPY;  Service: Cardiovascular;  Laterality: N/A;  . TONSILLECTOMY      Social History   Socioeconomic History  . Marital status: Widowed    Spouse name: Not on file  . Number of children: Not on file  . Years of education: Not on file  . Highest education level: Not on file  Occupational History  . Not on file  Tobacco Use  . Smoking status: Never Smoker  . Smokeless tobacco: Never Used  Vaping Use  . Vaping Use: Never used  Substance and Sexual Activity  . Alcohol use: No  . Drug use: No  . Sexual activity: Never    Comment: Smoked one cigarette a day as a teen  Other Topics Concern  . Not on file  Social History Narrative   Pt lives in [place] alone.  Homemaker.  Widowed   Social Determinants of Radio broadcast assistant Strain:   . Difficulty of Paying Living Expenses: Not on file  Food Insecurity:   . Worried About Charity fundraiser in the Last Year: Not on file  . Ran Out of Food in the Last Year: Not on file  Transportation Needs:   . Lack of Transportation (Medical): Not on file  .  Lack of Transportation (Non-Medical): Not on file  Physical Activity:   . Days of Exercise per Week: Not on file  . Minutes of Exercise per Session: Not on file  Stress:   . Feeling of Stress : Not on file  Social Connections:   . Frequency of Communication with Friends and Family: Not on file  . Frequency of Social Gatherings with Friends and Family: Not on file  . Attends Religious Services: Not on file  . Active Member of Clubs or Organizations: Not on file  . Attends Archivist Meetings: Not on file  . Marital Status: Not on file    reports that she has never smoked. She has never used smokeless tobacco. She reports that she does not drink alcohol and does not use drugs.  Functional Status Survey:    Family History  Problem Relation Age of Onset  . Heart disease Mother   . Heart attack Father   . Other Sister   . Brain cancer Sister   . Cancer Maternal Grandfather     Health Maintenance  Topic Date Due  . TETANUS/TDAP  Never done  . DEXA SCAN  Never done  . PNA vac Low Risk Adult (1 of 2 - PCV13) Never done  . INFLUENZA VACCINE  02/07/2020  . COVID-19 Vaccine  Completed    Allergies  Allergen Reactions  . Amiodarone Other (See Comments)    "Sick"- "allergic," per MAR  . Amoxicillin Nausea And Vomiting  . Amoxicillin-Pot Clavulanate Nausea And Vomiting  . Aspirin Other (See Comments)    Unknown... 325 mg and more.  [name] Other (See Comments)    "Allergic," per MAR  . Ciprofloxacin Other (See Comments)    Flu symptoms and "Allergic," per MAR  . Codeine Nausea And Vomiting and Other (See Comments)    "Allergic," per MAR  . Dairycare [Lactase-Lactobacillus] Other (See Comments)    "  Allergic," per MAR  . Diuretic [Buchu-Cornsilk-Ch Grass-Hydran] Other (See Comments)    Per Pharmacy record  . Fioricet-Codeine [Butalbital-Apap-Caff-Cod] Nausea And Vomiting and Other (See Comments)    "Allergic," per MAR  . Guaifenesin & Derivatives Other (See  Comments)    "Allergic," per MAR  . Iohexol Hives     Code: HIVES, Desc: **11/22/08 **NO REACTION W/ OMNI 300// PT S/P 3 CT SCANS W/ IV CM AND NO HIVES/MMS**PT STATED HIVES 16 YRS AGO. POSSIBLE IV DYE W/ CT/XRAY. MEDICATED W/ 50MG  OF BENEDRYL PO PRIOR TO SCAN, Onset Date: [id number]   . Lisinopril Other (See Comments) and Cough    "Allergic," per MAR  . Midazolam Hcl Nausea And Vomiting  . Milk-Related Compounds Other (See Comments)    "Allergic," per MAR  . Quinolones Other (See Comments)    Flu like symptoms  . Sotalol Other (See Comments)    "Allergic," per MAR  . Sulfa Antibiotics Other (See Comments)    Flu-like symptoms 'serum sickness' and "Allergic," per MAR  . Thioxanthenes Other (See Comments)    Flu like symptoms 'serum sickness'  . Valium Nausea And Vomiting and Other (See Comments)    "Allergic," per MAR  . Vancomycin Other (See Comments)    "Allergic," per MAR  . Versed [Midazolam] Nausea And Vomiting and Other (See Comments)    "Allergic," per MAR  . Vinegar [Acetic Acid] Other (See Comments)    "Allergic," per [facility]    Outpatient Encounter Medications as of 05/31/2020  Medication Sig  . acetaminophen (TYLENOL) 500 MG tablet Take 500 mg by mouth 3 (three) times daily as needed for mild pain, fever or headache.   [name] apixaban (ELIQUIS) 2.5 MG TABS tablet Take 2.5 mg by mouth 2 (two) times daily.   . [EXPIRED] cephALEXin (KEFLEX) 500 MG capsule Take 1 capsule (500 mg total) by mouth 3 (three) times daily for 5 days.  Christiane Ha Seed POWD Take 1 Scoop by mouth daily as needed (in morning smoothies for health benefits).   . D-Mannose POWD Take 5 mLs by mouth See admin instructions. Mix 5 ml's of reconstituted powder in a glass of juice daily and drink  . estradiol (ESTRACE) 0.1 MG/GM vaginal cream Place 1 g vaginally See admin instructions. Apply 1 gram vaginally on Tuesday and Friday nights  . Flaxseed, Linseed, (FLAX SEEDS) POWD Take by mouth See admin instructions. Mix 2  spoonfuls in smoothie every morning  . furosemide (LASIX) 40 MG tablet Take 1 tablet (40 mg total) by mouth daily. Take one tablet (40 mg) by mouth every evening for 3 days - 05/21/2020 thru 05/23/2020  . metoprolol tartrate (LOPRESSOR) 25 MG tablet Take 25 mg by mouth daily. Take 25 mg PO at bedtime for CHF  . metoprolol tartrate (LOPRESSOR) 50 MG tablet Take 50 mg by mouth daily. Special Instruction: For CHF Once a morning; 8:00am- 9:00 am  . Multiple Vitamins-Minerals (LUTEIN-ZEAXANTHIN) TABS Take 1 capsule by mouth in the morning.  . NON FORMULARY Take 2 capsules by mouth See admin instructions. Arthri-D3 dietary supplement (glucosamine with key plant extracts)- Take 2 capsules by mouth in the morning  . Omega-3 Fatty Acids (FISH OIL) 1000 MG CAPS Take 2,000 mg by mouth daily.  . ondansetron (ZOFRAN) 4 MG tablet Take 4 mg by mouth every 6 (six) hours as needed for nausea or vomiting.   . polyethylene glycol (MIRALAX / GLYCOLAX) packet Take 17 g by mouth daily. Mix in water or beverage of choice  .  potassium chloride SA (KLOR-CON) 20 MEQ tablet Take 1 tablet (20 mEq total) by mouth daily. Take one tablet (20 meq) by mouth every evening for 3 days - 05/21/2020 thru 05/23/2020  . Probiotic Product (PROBIOTIC PO) Take 1 capsule by mouth daily.  [name] senna-docusate (SENNA S) 8.6-50 MG tablet Take 1 tablet by mouth See admin instructions. Take 1 tablet by mouth daily and hold for loose stools  . Ubiquinol 100 MG CAPS Take 100 mg by mouth daily.  . vitamin B-12 (CYANOCOBALAMIN) 500 MCG tablet Take 500 mcg by mouth daily.  . vitamin C (ASCORBIC ACID) 250 MG tablet Take 750 mg by mouth daily.  . [DISCONTINUED] bisacodyl (DULCOLAX) 10 MG suppository Place 10 mg rectally as needed for moderate constipation.  . [DISCONTINUED] cetirizine (ZYRTEC) 10 MG tablet Take 10 mg by mouth as needed for allergies. Zyrtec 10 mg PO every 24 hours PRN itching  up to 48 hours as needed: PRN  . [DISCONTINUED] metoprolol  tartrate (LOPRESSOR) 25 MG tablet Take 25 mg by mouth 2 (two) times daily.  [name] OVER THE COUNTER MEDICATION Take 1 tablet by mouth daily as needed (GI upset). Digest Gold   No facility-administered encounter medications on file as of 05/31/2020.    Review of Systems  Constitutional: Positive for malaise/fatigue. Negative for chills and fever.       Up 3 lbs from hospital d/c  HENT: Negative for congestion and sore throat.   Eyes: Negative for pain.  Respiratory: Negative for cough and shortness of breath.   Cardiovascular: Negative for chest pain, palpitations and leg swelling.  Gastrointestinal: Negative for abdominal pain, blood in stool, constipation and melena.  Genitourinary: Negative for dysuria, frequency and urgency.       No acute symptoms  Musculoskeletal: Positive for falls. Negative for joint pain.  Skin:       Bruises healing from falls and now itch  Neurological: Positive for weakness. Negative for dizziness, speech change and loss of consciousness.       Change in coordination of left arm and minimally left leg, some left arm flaccidity and neglect  Endo/Heme/Allergies: Bruises/bleeds easily.  Psychiatric/Behavioral: Positive for memory loss. The patient is nervous/anxious and has insomnia.        "I fought with the covers last night"--staff note she was up and delirious last night    Vitals:   05/31/20 1500  BP: (!) 104/51  Pulse: 79  Temp: (!) 97.3 F (36.3 C)  SpO2: 99%  Weight: 111 lb 9.6 oz (50.6 kg)  Height: 5\' 2"  (1.575 m)   Body mass index is 20.41 kg/m. Physical Exam Vitals reviewed.  Constitutional:      General: She is not in acute distress.    Comments: Appears exhausted  HENT:     Head: Normocephalic.     Right Ear: Tympanic membrane, ear canal and external ear normal.     Left Ear: Tympanic membrane, ear canal and external ear normal.     Nose: Nose normal.     Mouth/Throat:     Pharynx: Oropharynx is clear.  Eyes:     Extraocular  Movements: Extraocular movements intact.     Conjunctiva/sclera: Conjunctivae normal.     Pupils: Pupils are equal, round, and reactive to light.     Comments: glasses  Cardiovascular:     Rate and Rhythm: Rhythm irregular.     Heart sounds: No murmur heard.   Pulmonary:     Effort: Pulmonary effort is normal.  Breath sounds: Normal breath sounds. No rales.  Abdominal:     General: Bowel sounds are normal. There is no distension.     Palpations: Abdomen is soft.     Tenderness: There is no abdominal tenderness. There is no guarding or rebound.  Musculoskeletal:        General: Normal range of motion.     Cervical back: Neck supple.     Right lower leg: No edema.     Left lower leg: No edema.     Comments: Some tenderness of left shoulder where she is still ecchymotic  Skin:    Capillary Refill: Capillary refill takes less than 2 seconds.     Findings: Bruising present.  Neurological:     Mental Status: She is alert and oriented to person, place, and time.     Cranial Nerves: No cranial nerve deficit.     Sensory: No sensory deficit.     Motor: No weakness.     Coordination: Coordination abnormal.     Gait: Gait abnormal.     Comments: Flaccid look to arm, but equal strength in grips and arms, finger to nose and heel to shin show poor coordination, seems to have some neglect/unawareness of her left arm at times, but does use it at other times (not classic neglect and very aware of things on her left and left leg not affected by this); able to walk with her walker as before  Psychiatric:        Mood and Affect: Mood normal.     Comments: A bit confused but oriented to person, place and general time     Labs reviewed: Basic Metabolic Panel: Recent Labs    05/22/20 1408 05/22/20 1408 05/23/20 0225 05/24/20 0444 05/25/20 0248 05/27/20 1440  NA 130*   < >  --  132* 131* 133*  K 3.6   < >  --  3.6 3.4* 3.5  CL 95*   < >  --  90* 91* 88*  CO2 23   < >  --  25 26 31     GLUCOSE 105*   < >  --  107* 117* 148*  BUN 17   < >  --  14 16 19   CREATININE 0.77   < >  --  0.82 0.82 0.89  CALCIUM 8.1*   < >  --  9.0 8.9 9.2  MG 1.7  --  1.7  --   --   --   PHOS  --   --  3.3  --   --   --    < > = values in this interval not displayed.   Liver Function Tests: Recent Labs    05/23/20 0404 05/24/20 0444 05/25/20 0248  AST 32 33 33  ALT 22 22 24   ALKPHOS 72 79 80  BILITOT 2.1* 2.2* 2.4*  PROT 6.5 6.7 6.3*  ALBUMIN 3.5 3.5 3.4*   No results for input(s): LIPASE, AMYLASE in the last 8760 hours. No results for input(s): AMMONIA in the last 8760 hours. CBC: Recent Labs    05/24/20 0444 05/25/20 0248 05/27/20 1440  WBC 6.8 7.6 7.9  NEUTROABS 4.9 5.6  --   HGB 12.5 12.2 13.9  HCT 37.2 36.5 42.1  MCV 104.2* 104.6* 110.5*  PLT 76* 75* 81*   Cardiac Enzymes: No results for input(s): CKTOTAL, CKMB, CKMBINDEX, TROPONINI in the last 8760 hours. BNP: Invalid input(s): POCBNP Lab Results  Component Value Date   HGBA1C  12/14/2008    5.2 (NOTE) The ADA recommends the following therapeutic goal for glycemic control related to Hgb A1c measurement: Goal of therapy: <6.5 Hgb A1c  Reference: American Diabetes Association: Clinical Practice Recommendations 2010, Diabetes Care, 2010, 33: (Suppl  1).   Lab Results  Component Value Date   TSH 3.040 01/28/2018   Lab Results  Component Value Date   VITAMINB12 2,459 (H) 05/23/2020   Lab Results  Component Value Date   FOLATE 19.6 05/23/2020   No results found for: IRON, TIBC, FERRITIN  Imaging and Procedures obtained prior to SNF admission: CT Head Wo Contrast  Result Date: 05/27/2020 CLINICAL DATA:  Left hand weakness EXAM: CT HEAD WITHOUT CONTRAST TECHNIQUE: Contiguous axial images were obtained from the base of the skull through the vertex without intravenous contrast. COMPARISON:  None. FINDINGS: Brain: No evidence of acute territorial infarction, hemorrhage, hydrocephalus,extra-axial collection or  mass lesion/mass effect. There is dilatation the ventricles and sulci consistent with age-related atrophy. Low-attenuation changes in the deep white matter consistent with small vessel ischemia. Vascular: No hyperdense vessel or unexpected calcification. Skull: The skull is intact. No fracture or focal lesion identified. Sinuses/Orbits: The visualized paranasal sinuses and mastoid air cells are clear. The orbits and globes intact. Other: None IMPRESSION: No acute intracranial abnormality. Findings consistent with age related atrophy and chronic small vessel ischemia Electronically Signed   By: Prudencio Pair M.D.   On: 05/27/2020 17:06   DG Chest Port 1 View  Result Date: 05/27/2020 CLINICAL DATA:  Fall EXAM: PORTABLE CHEST 1 VIEW COMPARISON:  May 22, 2020 FINDINGS: The heart size and mediastinal contours are mildly enlarged. Aortic knob calcifications are seen. No large airspace consolidation or pleural effusion. Healed rib fractures are seen in the right lower chest. IMPRESSION: No active disease. Electronically Signed   By: Prudencio Pair M.D.   On: 05/27/2020 18:01    Assessment/Plan 1. Cerebellar dysfunction -newly noted--see details above -continue neurochecks and regular vitals -will need MRI when no longer wearing her monitor and neuro eval--appears she did have a stroke that probably led to one of her falls in the recent past -continues on eliquis and lopressor (having to be held at times with hypotension)  2. Delirium -has had several changes in environment and baseline cognitive impairment, suspect recent stroke, as well, and being treated for otherwise asymptomatic "UTI" which may have been bacteriuria   3. Tachy-brady syndrome (HCC) -being investigated with preventice monitor   4. Recurrent falls -continue with PT, may now have have cerebellar involvement with stroke that certainly can impair gait, must use her walker at all times, but does not remember this  5. Persistent  atrial fibrillation (HCC) -cont beta blocker and NOAC per Dr. Tamala Julian  6. Chronic diastolic CHF (congestive heart failure), NYHA class 2 (HCC) -continue lasix and potassium -monitor carefully for hypovolemia in view of poor po intake  7. Acute cystitis without hematuria -completing course of keflex though not clearly symptomatic from this--had several other explanations for her change in mental status  8. Weakness generalized -cont PT here in rehab  9. DNR (do not resuscitate) -confirmed, paperwork on chart, also has told me she does NOT want to go back to the hospital so we need to get a MOST completed to document these wishes  Family/ staff Communication: d/w rehab nurse  Labs/tests ordered:  BMP pending, CBC to be drawn in am  Ayad Nieman L. Cato Liburd, D.O. [facility] [address]  [place], [place] [address] Cell Phone (Mon-Fri 8am-5pm):  [phone] On Call:  [phone] & follow prompts after 5pm & weekends Office Phone:  [phone] Office Fax:  [phone]

## 2020-05-31 NOTE — Telephone Encounter (Signed)
Spoke with pt and made her aware that I received her med list from [facility].  Advised her Metoprolol dosing is different than we thought.  Told pt to keep monitor on for now and I will speak with Dr. Tamala Julian to see if he wants her to keep it on or if it's still ok to send back.  Pt verbalized understanding and was appreciative for call.

## 2020-06-01 ENCOUNTER — Non-Acute Institutional Stay (SKILLED_NURSING_FACILITY): Payer: Medicare Other | Admitting: Adult Health

## 2020-06-01 ENCOUNTER — Encounter: Payer: Self-pay | Admitting: Adult Health

## 2020-06-01 DIAGNOSIS — R42 Dizziness and giddiness: Secondary | ICD-10-CM | POA: Diagnosis not present

## 2020-06-01 DIAGNOSIS — R531 Weakness: Secondary | ICD-10-CM

## 2020-06-01 DIAGNOSIS — R21 Rash and other nonspecific skin eruption: Secondary | ICD-10-CM

## 2020-06-01 DIAGNOSIS — R638 Other symptoms and signs concerning food and fluid intake: Secondary | ICD-10-CM | POA: Diagnosis not present

## 2020-06-01 DIAGNOSIS — M6389 Disorders of muscle in diseases classified elsewhere, multiple sites: Secondary | ICD-10-CM | POA: Diagnosis not present

## 2020-06-01 DIAGNOSIS — R410 Disorientation, unspecified: Secondary | ICD-10-CM | POA: Diagnosis not present

## 2020-06-01 DIAGNOSIS — G4701 Insomnia due to medical condition: Secondary | ICD-10-CM

## 2020-06-01 DIAGNOSIS — R17 Unspecified jaundice: Secondary | ICD-10-CM | POA: Diagnosis not present

## 2020-06-01 DIAGNOSIS — R278 Other lack of coordination: Secondary | ICD-10-CM | POA: Diagnosis not present

## 2020-06-01 DIAGNOSIS — R0600 Dyspnea, unspecified: Secondary | ICD-10-CM | POA: Diagnosis not present

## 2020-06-01 DIAGNOSIS — E871 Hypo-osmolality and hyponatremia: Secondary | ICD-10-CM | POA: Diagnosis not present

## 2020-06-01 DIAGNOSIS — D6869 Other thrombophilia: Secondary | ICD-10-CM | POA: Diagnosis not present

## 2020-06-01 DIAGNOSIS — R296 Repeated falls: Secondary | ICD-10-CM | POA: Diagnosis not present

## 2020-06-01 DIAGNOSIS — R2689 Other abnormalities of gait and mobility: Secondary | ICD-10-CM | POA: Diagnosis not present

## 2020-06-01 DIAGNOSIS — G3184 Mild cognitive impairment, so stated: Secondary | ICD-10-CM | POA: Diagnosis not present

## 2020-06-01 DIAGNOSIS — E782 Mixed hyperlipidemia: Secondary | ICD-10-CM | POA: Diagnosis not present

## 2020-06-01 NOTE — Telephone Encounter (Signed)
Spoke with Dr. Tamala Julian this morning and he said ok to leave pt on Metoprolol Tartrate 50AM/25PM.  Ok to remove monitor.

## 2020-06-01 NOTE — Telephone Encounter (Signed)
Attempted to contact pt again.  Still no answer.  °

## 2020-06-01 NOTE — Telephone Encounter (Signed)
Spoke with Dr. Tamala Julian and he said ok to take monitor off.  Called pt.  No answer and no VM.

## 2020-06-01 NOTE — Telephone Encounter (Signed)
Spoke with Katharine Look, nurse manager, and let her know that it is ok for pt to take monitor off.  Katharine Look said they would get that taken care of.

## 2020-06-01 NOTE — Progress Notes (Signed)
Location:  Occupational psychologist of Service:  SNF (31) Provider:   Cindi Carbon, ANP [place] [phone]  Gayland Curry, DO  Patient Care Team: Gayland Curry, DO as PCP - General (Geriatric Medicine) Belva Crome, MD as PCP - Cardiology (Cardiology) Constance Haw, MD as PCP - Electrophysiology (Cardiology)  Extended Emergency Contact Information Primary Emergency Contact: Berneda Rose Address: [address]          [place] Phone: [phone] Mobile Phone: [phone] Relation: Son Secondary Emergency Contact: Ron Parker [place] Phone: [phone] Mobile Phone: [phone] Relation: Son  Code Status:  DNR Goals of care: Advanced Directive information Advanced Directives 05/31/2020  Does Patient Have a Medical Advance Directive? Yes  Type of Advance Directive Out of facility DNR (pink MOST or yellow form)  Does patient want to make changes to medical advance directive? No - Patient declined  Copy of [place] in Chart? -  Would patient like information on creating a medical advance directive? -  Pre-existing out of facility DNR order (yellow form or pink MOST form) Pink MOST form placed in chart (order not valid for inpatient use)     Chief Complaint  Patient presents with   Acute Visit    rash and not sleeping    HPI:  Pt is a 86 y.o. female seen today for an acute visit for rash and not sleeping.   Ms. Centner resides in rehab due to falls, left sided weakness, and increased care needs. Admitted to the hospital 05/22/20-05/25/20 for CHF and was diuresed. Then on 11/19 she was sent out for left sided weakness. This resolved on the way over apparently as these symptoms were not present on exam and her head CT was negative. She was started on Keflex for a UTI in the ED. They could not do an MRI due to the fact she was wearing a heart  monitor for afib.  She continues with mild left sided weakness, poor balance, and coordination. She is not currently having dizziness but this was an issue prior to admission to the hospital.  She has a hx of mild memory loss but since her hospitalization she has been more confused at night and attempts to get up without her walker. She got up last night several times and changed clothes and did not sleep well. During the day she is pleasant and alert. At her beside is a container of melatonin spray but she forgot to take it. She has an itchy rash to her back that has been present for several days. The rash was noted prior to starting Keflex.   She is 112 lbs today. No sob, doe, edema etc. Only ate a few bites of food and felt like it got stuck. This is first time this has happened. No coughing, decreased 02 sats or fever noted. No other reports of difficulty swallowing. She does have a hx of gerd and hiatal hernia. Takes digest gold as needed.  Wt Readings from Last 3 Encounters:  06/01/20 112 lb (50.8 kg)  05/31/20 111 lb 9.6 oz (50.6 kg)  05/30/20 115 lb (52.2 kg)      Past Medical History:  Diagnosis Date   Anxiety    Arthritis    Some in neck and back, but "can't complain"   Atypical atrial flutter ([place])    Breast cancer ([place])    Cancer ([place])  1998 Right breast cancer with lumpectomy and radiation therapy   Chronic diastolic CHF (congestive heart failure) (HCC)    Complication of anesthesia    Nausea from versed   GERD (gastroesophageal reflux disease)    Glaucoma    Hiatal hernia    History of mitral valve repair    Mitral valve prolapse, surgery in 2010   Hyponatremia    Normocytic anemia 03/21/2014   Persistent atrial fibrillation ([place])    s/p MAZE 2010   Personal history of radiation therapy    Rheumatic fever    age 29   Thrombocytopenia ([place])    Past Surgical History:  Procedure Laterality Date   ABDOMINAL HYSTERECTOMY     APPENDECTOMY     BACK  SURGERY     Synovial cyst on spine, removed laproscopically in 2000?   BREAST BIOPSY     BREAST LUMPECTOMY     right [place] Lumpectomy in right breast   CARDIAC CATHETERIZATION     CARDIOVERSION  04/29/2012   Procedure: CARDIOVERSION;  Surgeon: [name], MD;  Location: [facility];  Service: Cardiovascular;  Laterality: N/A;   CARDIOVERSION  06/19/2012   Procedure: CARDIOVERSION;  Surgeon: Sinclair Grooms, MD;  Location: [facility] ENDOSCOPY;  Service: Cardiovascular;  Laterality: N/A;  h/p from 11/8 in file drawer/dl   CATARACT EXTRACTION     CESAREAN SECTION     Three C-Sections   EYE SURGERY     Cataract Surgery, both eyes   KIDNEY CYST REMOVAL     MITRAL VALVE REPAIR  June 2010   TEE WITHOUT CARDIOVERSION  04/29/2012   Procedure: TRANSESOPHAGEAL ECHOCARDIOGRAM (TEE);  Surgeon: [name], MD;  Location: [facility] ENDOSCOPY;  Service: Cardiovascular;  Laterality: N/A;   TONSILLECTOMY      Allergies  Allergen Reactions   Amiodarone Other (See Comments)    "Sick"- "allergic," per [facility]   Amoxicillin Nausea And Vomiting   Amoxicillin-Pot Clavulanate Nausea And Vomiting   Aspirin Other (See Comments)    Unknown... 325 mg and more.   Cheese Other (See Comments)    "Allergic," per [facility]   Ciprofloxacin Other (See Comments)    Flu symptoms and "Allergic," per [facility]   Codeine Nausea And Vomiting and Other (See Comments)    "Allergic," per [facility]   Dairycare [Lactase-Lactobacillus] Other (See Comments)    "Allergic," per [facility]   Diuretic [Buchu-Cornsilk-Ch Grass-Hydran] Other (See Comments)    Per Pharmacy record   Fioricet-Codeine [Butalbital-Apap-Caff-Cod] Nausea And Vomiting and Other (See Comments)    "Allergic," per [facility]   Guaifenesin & Derivatives Other (See Comments)    "Allergic," per [facility]   Iohexol Hives     Code: HIVES, Desc: **11/22/08 **NO REACTION W/ OMNI 300// PT S/P 3 CT SCANS W/ IV CM AND NO HIVES/MMS**PT STATED HIVES 16 YRS  AGO. POSSIBLE IV DYE W/ CT/XRAY. MEDICATED W/ 50MG  OF BENEDRYL PO PRIOR TO SCAN, Onset Date: [id number]    Lisinopril Other (See Comments) and Cough    "Allergic," per [facility]   Midazolam Hcl Nausea And Vomiting   Milk-Related Compounds Other (See Comments)    "Allergic," per [facility]   Quinolones Other (See Comments)    Flu like symptoms   Sotalol Other (See Comments)    "Allergic," per [facility]   Sulfa Antibiotics Other (See Comments)    Flu-like symptoms 'serum sickness' and "Allergic," per [facility]   Thioxanthenes Other (See Comments)    Flu like symptoms '  serum sickness'   Valium Nausea And Vomiting and Other (See Comments)    "Allergic," per [facility]   Vancomycin Other (See Comments)    "Allergic," per [facility]   Versed [Midazolam] Nausea And Vomiting and Other (See Comments)    "Allergic," per [facility]   Vinegar [Acetic Acid] Other (See Comments)    "Allergic," per [facility]    Outpatient Encounter Medications as of 06/01/2020  Medication Sig   acetaminophen (TYLENOL) 500 MG tablet Take 500 mg by mouth 3 (three) times daily as needed for mild pain, fever or headache.    apixaban (ELIQUIS) 2.5 MG TABS tablet Take 2.5 mg by mouth 2 (two) times daily.    cephALEXin (KEFLEX) 500 MG capsule Take 1 capsule (500 mg total) by mouth 3 (three) times daily for 5 days.   Chia Seed POWD Take 1 Scoop by mouth daily as needed (in morning smoothies for health benefits).    D-Mannose POWD Take 5 mLs by mouth See admin instructions. Mix 5 ml's of reconstituted powder in a glass of juice daily and drink   estradiol (ESTRACE) 0.1 MG/GM vaginal cream Place 1 g vaginally See admin instructions. Apply 1 gram vaginally on Tuesday and Friday nights   Flaxseed, Linseed, (FLAX SEEDS) POWD Take by mouth See admin instructions. Mix 2 spoonfuls in smoothie every morning   furosemide (LASIX) 40 MG tablet Take 1 tablet (40 mg total) by mouth daily. Take one tablet (40 mg) by mouth every evening for 3 days - 05/21/2020 thru  05/23/2020   metoprolol tartrate (LOPRESSOR) 25 MG tablet Take 25 mg by mouth daily. Take 25 mg PO at bedtime for CHF   metoprolol tartrate (LOPRESSOR) 50 MG tablet Take 50 mg by mouth daily. Special Instruction: For CHF Once a morning; 8:00am- 9:00 am   Multiple Vitamins-Minerals (LUTEIN-ZEAXANTHIN) TABS Take 1 capsule by mouth in the morning.   NON FORMULARY Take 2 capsules by mouth See admin instructions. Arthri-D3 dietary supplement (glucosamine with key plant extracts)- Take 2 capsules by mouth in the morning   Omega-3 Fatty Acids (FISH OIL) 1000 MG CAPS Take 2,000 mg by mouth daily.   ondansetron (ZOFRAN) 4 MG tablet Take 4 mg by mouth every 6 (six) hours as needed for nausea or vomiting.    OVER THE COUNTER MEDICATION Take 1 tablet by mouth daily as needed (GI upset). Digest Gold   polyethylene glycol (MIRALAX / GLYCOLAX) packet Take 17 g by mouth daily. Mix in water or beverage of choice   potassium chloride SA (KLOR-CON) 20 MEQ tablet Take 1 tablet (20 mEq total) by mouth daily. Take one tablet (20 meq) by mouth every evening for 3 days - 05/21/2020 thru 05/23/2020   Probiotic Product (PROBIOTIC PO) Take 1 capsule by mouth daily.   senna-docusate (SENNA S) 8.6-50 MG tablet Take 1 tablet by mouth See admin instructions. Take 1 tablet by mouth daily and hold for loose stools   Ubiquinol 100 MG CAPS Take 100 mg by mouth daily.   vitamin B-12 (CYANOCOBALAMIN) 500 MCG tablet Take 500 mcg by mouth daily.   vitamin C (ASCORBIC ACID) 250 MG tablet Take 750 mg by mouth daily.   No facility-administered encounter medications on file as of 06/01/2020.    Review of Systems  Constitutional: Positive for activity change and appetite change. Negative for chills, diaphoresis, fatigue, fever and unexpected weight change.  HENT: Negative for congestion.   Respiratory: Negative for cough, shortness of breath and wheezing.   Cardiovascular: Negative for chest pain,  palpitations and leg  swelling.  Gastrointestinal: Negative for abdominal distention, abdominal pain, constipation and diarrhea.       Feels she had reflux due to feeling like food got stuck while eating breakfast  Genitourinary: Negative for difficulty urinating and dysuria.  Musculoskeletal: Positive for gait problem. Negative for arthralgias, back pain, joint swelling and myalgias.  Neurological: Positive for weakness. Negative for dizziness, tremors, seizures, syncope, facial asymmetry, speech difficulty, light-headedness, numbness and headaches.  Psychiatric/Behavioral: Positive for confusion and sleep disturbance. Negative for agitation and behavioral problems.    Immunization History  Administered Date(s) Administered   Influenza Split 04/28/2012   Moderna SARS-COVID-2 Vaccination 07/19/2019, 08/18/2019   Zoster Recombinat (Shingrix) 03/31/2019   Pertinent  Health Maintenance Due  Topic Date Due   DEXA SCAN  Never done   PNA vac Low Risk Adult (1 of 2 - PCV13) Never done   INFLUENZA VACCINE  02/07/2020   Fall Risk  03/15/2016  Falls in the past year? Yes  Comment [facility]i Telephone Survey: data to providers prior to load  Number falls in past yr: 1  Comment Emmi Telephone Survey Actual Response = 1  Injury with Fall? No   Functional Status Survey:    Vitals:   06/01/20 0942  BP: 128/75  Pulse: 98  Resp: 14  Temp: (!) 97.5 F (36.4 C)  SpO2: 98%  Weight: 112 lb (50.8 kg)   Body mass index is 20.49 kg/m. Physical Exam Vitals and nursing note reviewed.  Constitutional:      General: She is not in acute distress.    Appearance: She is not diaphoretic.     Comments: Frail thin female  HENT:     Head: Normocephalic and atraumatic.     Mouth/Throat:     Mouth: Mucous membranes are moist.     Pharynx: Oropharynx is clear.  Eyes:     Conjunctiva/sclera: Conjunctivae normal.     Pupils: Pupils are equal, round, and reactive to light.  Neck:     Vascular: No JVD.  Cardiovascular:      Rate and Rhythm: Normal rate. Rhythm irregular.     Heart sounds: No murmur heard.   Pulmonary:     Effort: Pulmonary effort is normal. No respiratory distress.     Breath sounds: Normal breath sounds. No wheezing.  Abdominal:     General: Bowel sounds are normal. There is no distension.     Palpations: Abdomen is soft.     Tenderness: There is no abdominal tenderness.  Musculoskeletal:     Right lower leg: No edema.     Left lower leg: No edema.  Skin:    General: Skin is warm and dry.     Coloration: Skin is pale.     Findings: Bruising (both  arms) present.  Neurological:     Mental Status: She is alert and oriented to person, place, and time.     Motor: Weakness present.     Coordination: Coordination abnormal.     Gait: Gait abnormal.     Comments: Mild left arm weakness. Lack of coordination to the left side. Poor balance.   Psychiatric:        Mood and Affect: Mood normal.     Labs reviewed: Recent Labs    05/22/20 1408 05/22/20 1408 05/23/20 0225 05/24/20 0444 05/25/20 0248 05/27/20 1440  NA 130*   < >  --  132* 131* 133*  K 3.6   < >  --  3.6 3.4* 3.5  CL 95*   < >  --  90* 91* 88*  CO2 23   < >  --  25 26 31   GLUCOSE 105*   < >  --  107* 117* 148*  BUN 17   < >  --  14 16 19   CREATININE 0.77   < >  --  0.82 0.82 0.89  CALCIUM 8.1*   < >  --  9.0 8.9 9.2  MG 1.7  --  1.7  --   --   --   PHOS  --   --  3.3  --   --   --    < > = values in this interval not displayed.   Recent Labs    05/23/20 0404 05/24/20 0444 05/25/20 0248  AST 32 33 33  ALT 22 22 24   ALKPHOS 72 79 80  BILITOT 2.1* 2.2* 2.4*  PROT 6.5 6.7 6.3*  ALBUMIN 3.5 3.5 3.4*   Recent Labs    05/24/20 0444 05/25/20 0248 05/27/20 1440  WBC 6.8 7.6 7.9  NEUTROABS 4.9 5.6  --   HGB 12.5 12.2 13.9  HCT 37.2 36.5 42.1  MCV 104.2* 104.6* 110.5*  PLT 76* 75* 81*   Lab Results  Component Value Date   TSH 3.040 01/28/2018   Lab Results  Component Value Date   HGBA1C  12/14/2008     5.2 (NOTE) The ADA recommends the following therapeutic goal for glycemic control related to Hgb A1c measurement: Goal of therapy: <6.5 Hgb A1c  Reference: American Diabetes Association: Clinical Practice Recommendations 2010, Diabetes Care, 2010, 33: (Suppl  1).   No results found for: CHOL, HDL, LDLCALC, LDLDIRECT, TRIG, CHOLHDL  Significant Diagnostic Results in last 30 days:  CT Head Wo Contrast  Result Date: 05/27/2020 CLINICAL DATA:  Left hand weakness EXAM: CT HEAD WITHOUT CONTRAST TECHNIQUE: Contiguous axial images were obtained from the base of the skull through the vertex without intravenous contrast. COMPARISON:  None. FINDINGS: Brain: No evidence of acute territorial infarction, hemorrhage, hydrocephalus,extra-axial collection or mass lesion/mass effect. There is dilatation the ventricles and sulci consistent with age-related atrophy. Low-attenuation changes in the deep white matter consistent with small vessel ischemia. Vascular: No hyperdense vessel or unexpected calcification. Skull: The skull is intact. No fracture or focal lesion identified. Sinuses/Orbits: The visualized paranasal sinuses and mastoid air cells are clear. The orbits and globes intact. Other: None IMPRESSION: No acute intracranial abnormality. Findings consistent with age related atrophy and chronic small vessel ischemia Electronically Signed   By: Prudencio Pair M.D.   On: 05/27/2020 17:06   CT Head Wo Contrast  Result Date: 05/15/2020 CLINICAL DATA:  Fall 3 days ago EXAM: CT HEAD WITHOUT CONTRAST TECHNIQUE: Contiguous axial images were obtained from the base of the skull through the vertex without intravenous contrast. COMPARISON:  None. FINDINGS: Brain: No evidence of acute territorial infarction, hemorrhage, hydrocephalus,extra-axial collection or mass lesion/mass effect. There is dilatation the ventricles and sulci consistent with age-related atrophy. Low-attenuation changes in the deep white matter consistent with  small vessel ischemia. There appears to be a probable prior lacunar infarct involving the left insular cortex. Vascular: No hyperdense vessel or unexpected calcification. Skull: The skull is intact. No fracture or focal lesion identified. Sinuses/Orbits: The visualized paranasal sinuses and mastoid air cells are clear. The orbits and globes intact. Other: None Cervical spine: Alignment: There is a mild leftward curvature of the cervical spine. Skull base and vertebrae: Visualized skull base is intact. No atlanto-occipital  dissociation. The vertebral body heights are well maintained. No fracture or pathologic osseous lesion seen. Soft tissues and spinal canal: The visualized paraspinal soft tissues are unremarkable. No prevertebral soft tissue swelling is seen. The spinal canal is grossly unremarkable, no large epidural collection or significant canal narrowing. Disc levels: Cervical spine spondylosis is noted with disc osteophyte complex and uncovertebral osteophytes most notable at C5-C6 and C6-C7 with severe neural foraminal narrowing and mild central canal stenosis. Upper chest: Biapical scarring is seen. Thoracic inlet is within normal limits. Other: None IMPRESSION: No acute intracranial abnormality. Findings consistent with age related atrophy and chronic small vessel ischemia Probable prior lacunar infarct within the left insular cortex. No acute fracture or malalignment of the spine. Electronically Signed   By: Prudencio Pair M.D.   On: 05/15/2020 15:05   CT Cervical Spine Wo Contrast  Result Date: 05/15/2020 CLINICAL DATA:  Fall 3 days ago EXAM: CT HEAD WITHOUT CONTRAST TECHNIQUE: Contiguous axial images were obtained from the base of the skull through the vertex without intravenous contrast. COMPARISON:  None. FINDINGS: Brain: No evidence of acute territorial infarction, hemorrhage, hydrocephalus,extra-axial collection or mass lesion/mass effect. There is dilatation the ventricles and sulci consistent  with age-related atrophy. Low-attenuation changes in the deep white matter consistent with small vessel ischemia. There appears to be a probable prior lacunar infarct involving the left insular cortex. Vascular: No hyperdense vessel or unexpected calcification. Skull: The skull is intact. No fracture or focal lesion identified. Sinuses/Orbits: The visualized paranasal sinuses and mastoid air cells are clear. The orbits and globes intact. Other: None Cervical spine: Alignment: There is a mild leftward curvature of the cervical spine. Skull base and vertebrae: Visualized skull base is intact. No atlanto-occipital dissociation. The vertebral body heights are well maintained. No fracture or pathologic osseous lesion seen. Soft tissues and spinal canal: The visualized paraspinal soft tissues are unremarkable. No prevertebral soft tissue swelling is seen. The spinal canal is grossly unremarkable, no large epidural collection or significant canal narrowing. Disc levels: Cervical spine spondylosis is noted with disc osteophyte complex and uncovertebral osteophytes most notable at C5-C6 and C6-C7 with severe neural foraminal narrowing and mild central canal stenosis. Upper chest: Biapical scarring is seen. Thoracic inlet is within normal limits. Other: None IMPRESSION: No acute intracranial abnormality. Findings consistent with age related atrophy and chronic small vessel ischemia Probable prior lacunar infarct within the left insular cortex. No acute fracture or malalignment of the spine. Electronically Signed   By: Prudencio Pair M.D.   On: 05/15/2020 15:05   DG Chest Port 1 View  Result Date: 05/27/2020 CLINICAL DATA:  Fall EXAM: PORTABLE CHEST 1 VIEW COMPARISON:  May 22, 2020 FINDINGS: The heart size and mediastinal contours are mildly enlarged. Aortic knob calcifications are seen. No large airspace consolidation or pleural effusion. Healed rib fractures are seen in the right lower chest. IMPRESSION: No active  disease. Electronically Signed   By: Prudencio Pair M.D.   On: 05/27/2020 18:01   DG Chest Portable 1 View  Result Date: 05/22/2020 CLINICAL DATA:  Shortness of breath with lower extremity edema EXAM: PORTABLE CHEST 1 VIEW COMPARISON:  Nov 17, 2016 FINDINGS: There is a small left pleural effusion with airspace opacity in the left base. There is subtle ill-defined opacity in the right mid lung. Lungs elsewhere are clear. There is cardiomegaly with pulmonary vascularity normal. No adenopathy. There is aortic atherosclerosis. No bone lesions. There is mitral annulus calcification. IMPRESSION: Stable cardiomegaly. Left pleural effusion with left  lower lobe consolidation, likely due to combination of atelectasis and pneumonia. Subtle ill-defined opacity right mid lung likely a second focus of pneumonia. Aortic Atherosclerosis (ICD10-I70.0). Electronically Signed   By: Lowella Grip III M.D.   On: 05/22/2020 14:22   ECHOCARDIOGRAM COMPLETE  Result Date: 05/23/2020    ECHOCARDIOGRAM REPORT   Patient Name:   LILYROSE TANNEY Date of Exam: 05/23/2020 Medical Rec #:  [id number]     Height:       62.0 in Accession #:    [id number]    Weight:       122.8 lb Date of Birth:  1925-09-15      BSA:          1.554 m Patient Age:    54 years      BP:           129/67 mmHg Patient Gender: F             HR:           71 bpm. Exam Location:  Inpatient Procedure: 2D Echo, Cardiac Doppler and Color Doppler Indications:    CHF-Acute Systolic 629.52 / W41.32  History:        Patient has prior history of Echocardiogram examinations, most                 recent 09/10/2012. CHF, Arrythmias:Atrial Fibrillation and Atrial                 Flutter, Signs/Symptoms:Shortness of Breath; Risk                 Factors:Non-Smoker. GERD.                  Mitral Valve: prosthetic annuloplasty ring valve is present in                 the mitral position. Procedure Date: 12/2008.  Sonographer:    Vickie Epley RDCS Referring Phys: [id number] OLADAPO ADEFESO  IMPRESSIONS  1. Left ventricular ejection fraction, by estimation, is 50 to 55%. The left ventricle has low normal function. The left ventricle has no regional wall motion abnormalities. Left ventricular diastolic parameters are consistent with Grade II diastolic dysfunction (pseudonormalization). There is the interventricular septum is flattened in systole and diastole, consistent with right ventricular pressure and volume overload.  2. Right ventricular systolic function is normal. The right ventricular size is normal. There is severely elevated pulmonary artery systolic pressure. The estimated right ventricular systolic pressure is 25.3 mmHg.  3. Left atrial size was severely dilated.  4. Right atrial size was severely dilated.  5. The mitral valve has been repaired/replaced. Mild to moderate mitral valve regurgitation. No evidence of mitral stenosis. There is a prosthetic annuloplasty ring present in the mitral position. Procedure Date: 12/2008.  6. The aortic valve is tricuspid. Aortic valve regurgitation is moderate to severe. No aortic stenosis is present.  7. The inferior vena cava is normal in size with greater than 50% respiratory variability, suggesting right atrial pressure of 3 mmHg. Comparison(s): Prior images unable to be directly viewed, comparison made by report only. Prior EF 50-55% in 2014 report. Aortic regurgitation has advanced from trivial. FINDINGS  Left Ventricle: Left ventricular ejection fraction, by estimation, is 50 to 55%. The left ventricle has low normal function. The left ventricle has no regional wall motion abnormalities. The left ventricular internal cavity size was normal in size. There is no left ventricular hypertrophy. The interventricular septum is flattened in systole  and diastole, consistent with right ventricular pressure and volume overload. Left ventricular diastolic parameters are consistent with Grade II diastolic dysfunction (pseudonormalization). Right Ventricle: The  right ventricular size is normal. No increase in right ventricular wall thickness. Right ventricular systolic function is normal. There is severely elevated pulmonary artery systolic pressure. The tricuspid regurgitant velocity is 3.60 m/s, and with an assumed right atrial pressure of 15 mmHg, the estimated right ventricular systolic pressure is 42.6 mmHg. Left Atrium: Left atrial size was severely dilated. Right Atrium: Right atrial size was severely dilated. Pericardium: There is no evidence of pericardial effusion. Mitral Valve: The mitral valve has been repaired/replaced. Mild to moderate mitral valve regurgitation, with anteriorly-directed jet. There is a prosthetic annuloplasty ring present in the mitral position. Procedure Date: 12/2008. No evidence of mitral valve stenosis. MV peak gradient, 12.8 mmHg. The mean mitral valve gradient is 4.0 mmHg. Tricuspid Valve: The tricuspid valve is normal in structure. Tricuspid valve regurgitation is not demonstrated. No evidence of tricuspid stenosis. Aortic Valve: The aortic valve is tricuspid. Aortic valve regurgitation is moderate to severe. Aortic regurgitation PHT measures 518 msec. No aortic stenosis is present. Pulmonic Valve: The pulmonic valve was normal in structure. Pulmonic valve regurgitation is mild to moderate. No evidence of pulmonic stenosis. Aorta: The aortic root is normal in size and structure. Venous: The inferior vena cava is normal in size with greater than 50% respiratory variability, suggesting right atrial pressure of 3 mmHg. IAS/Shunts: No atrial level shunt detected by color flow Doppler.  LEFT VENTRICLE PLAX 2D LVIDd:         3.80 cm     Diastology LVIDs:         2.80 cm     LV e' medial:    3.88 cm/s LV PW:         0.90 cm     LV E/e' medial:  35.6 LV IVS:        0.90 cm     LV e' lateral:   5.80 cm/s LVOT diam:     2.00 cm     LV E/e' lateral: 23.8 LV SV:         52 LV SV Index:   33 LVOT Area:     3.14 cm  LV Volumes (MOD) LV vol d, MOD  A2C: 80.4 ml LV vol d, MOD A4C: 77.8 ml LV vol s, MOD A2C: 35.1 ml LV vol s, MOD A4C: 39.6 ml LV SV MOD A2C:     45.3 ml LV SV MOD A4C:     77.8 ml LV SV MOD BP:      41.2 ml RIGHT VENTRICLE RV S prime:     9.38 cm/s TAPSE (M-mode): 1.6 cm LEFT ATRIUM             Index       RIGHT ATRIUM           Index LA diam:        5.50 cm 3.54 cm/m  RA Area:     28.90 cm LA Vol (A2C):   78.0 ml 50.20 ml/m RA Volume:   98.40 ml  63.33 ml/m LA Vol (A4C):   89.2 ml 57.41 ml/m LA Biplane Vol: 85.8 ml 55.22 ml/m  AORTIC VALVE LVOT Vmax:   80.90 cm/s LVOT Vmean:  54.800 cm/s LVOT VTI:    0.165 m AI PHT:      518 msec  AORTA Ao Asc diam: 3.30 cm MITRAL VALVE  TRICUSPID VALVE MV Area (PHT): 7.16 cm     TR Peak grad:   51.8 mmHg MV Peak grad:  12.8 mmHg    TR Vmax:        360.00 cm/s MV Mean grad:  4.0 mmHg MV Vmax:       1.79 m/s     SHUNTS MV Vmean:      84.4 cm/s    Systemic VTI:  0.16 m MV Decel Time: 106 msec     Systemic Diam: 2.00 cm MR Peak grad: 87.6 mmHg MR Mean grad: 60.0 mmHg MR Vmax:      468.00 cm/s MR Vmean:     362.0 cm/s MV E velocity: 138.00 cm/s MV A velocity: 78.20 cm/s MV E/A ratio:  1.76 Candee Furbish MD Electronically signed by Candee Furbish MD Signature Date/Time: 05/23/2020/11:32:20 AM    Final     Assessment/Plan 1. Delirium  Due to change of environment and possible CVA Continue to monitor  Fall prec Keep close to nursing station  2. Insomnia due to medical condition Melatonin 3 mg qhs  3. Left-sided weakness Pt continues with mild left sided weakness and lack of coordination and balance Seems to have had a CVA but awaiting neuro input.   4. Rash Triamcinolone 0.1% bid x 7 days  5. Decreased oral intake Boost or breeze 1 can qd prn Monitor for swallow issues and if present obtain ST consult  6. Elevated bilirubin Bilirubin 2.4 05/25/20 She does have a slight yellow pale color, but no abd or fever. Recommend f/u LFTs and if it continues to be elevated may need abd  U/S   Family/ staff Communication: discussed with pt and her nurse Amber  Labs/tests ordered:  Bmp/cbc pending

## 2020-06-03 DIAGNOSIS — E871 Hypo-osmolality and hyponatremia: Secondary | ICD-10-CM | POA: Diagnosis not present

## 2020-06-03 DIAGNOSIS — G3184 Mild cognitive impairment, so stated: Secondary | ICD-10-CM | POA: Diagnosis not present

## 2020-06-03 DIAGNOSIS — R296 Repeated falls: Secondary | ICD-10-CM | POA: Diagnosis not present

## 2020-06-03 DIAGNOSIS — M6389 Disorders of muscle in diseases classified elsewhere, multiple sites: Secondary | ICD-10-CM | POA: Diagnosis not present

## 2020-06-03 DIAGNOSIS — R278 Other lack of coordination: Secondary | ICD-10-CM | POA: Diagnosis not present

## 2020-06-03 DIAGNOSIS — R2689 Other abnormalities of gait and mobility: Secondary | ICD-10-CM | POA: Diagnosis not present

## 2020-06-05 DIAGNOSIS — R2689 Other abnormalities of gait and mobility: Secondary | ICD-10-CM | POA: Diagnosis not present

## 2020-06-05 DIAGNOSIS — G3184 Mild cognitive impairment, so stated: Secondary | ICD-10-CM | POA: Diagnosis not present

## 2020-06-05 DIAGNOSIS — M6389 Disorders of muscle in diseases classified elsewhere, multiple sites: Secondary | ICD-10-CM | POA: Diagnosis not present

## 2020-06-05 DIAGNOSIS — R296 Repeated falls: Secondary | ICD-10-CM | POA: Diagnosis not present

## 2020-06-05 DIAGNOSIS — E871 Hypo-osmolality and hyponatremia: Secondary | ICD-10-CM | POA: Diagnosis not present

## 2020-06-05 DIAGNOSIS — R278 Other lack of coordination: Secondary | ICD-10-CM | POA: Diagnosis not present

## 2020-06-06 DIAGNOSIS — G3184 Mild cognitive impairment, so stated: Secondary | ICD-10-CM | POA: Diagnosis not present

## 2020-06-06 DIAGNOSIS — M6389 Disorders of muscle in diseases classified elsewhere, multiple sites: Secondary | ICD-10-CM | POA: Diagnosis not present

## 2020-06-06 DIAGNOSIS — R296 Repeated falls: Secondary | ICD-10-CM | POA: Diagnosis not present

## 2020-06-06 DIAGNOSIS — R2689 Other abnormalities of gait and mobility: Secondary | ICD-10-CM | POA: Diagnosis not present

## 2020-06-06 DIAGNOSIS — R278 Other lack of coordination: Secondary | ICD-10-CM | POA: Diagnosis not present

## 2020-06-06 DIAGNOSIS — E871 Hypo-osmolality and hyponatremia: Secondary | ICD-10-CM | POA: Diagnosis not present

## 2020-06-07 ENCOUNTER — Encounter: Payer: Self-pay | Admitting: Neurology

## 2020-06-07 ENCOUNTER — Telehealth: Payer: Self-pay | Admitting: Interventional Cardiology

## 2020-06-07 ENCOUNTER — Other Ambulatory Visit: Payer: Self-pay | Admitting: *Deleted

## 2020-06-07 DIAGNOSIS — R55 Syncope and collapse: Secondary | ICD-10-CM

## 2020-06-07 DIAGNOSIS — R42 Dizziness and giddiness: Secondary | ICD-10-CM

## 2020-06-07 DIAGNOSIS — G3184 Mild cognitive impairment, so stated: Secondary | ICD-10-CM | POA: Diagnosis not present

## 2020-06-07 DIAGNOSIS — E871 Hypo-osmolality and hyponatremia: Secondary | ICD-10-CM | POA: Diagnosis not present

## 2020-06-07 DIAGNOSIS — M6389 Disorders of muscle in diseases classified elsewhere, multiple sites: Secondary | ICD-10-CM | POA: Diagnosis not present

## 2020-06-07 DIAGNOSIS — R278 Other lack of coordination: Secondary | ICD-10-CM | POA: Diagnosis not present

## 2020-06-07 DIAGNOSIS — R296 Repeated falls: Secondary | ICD-10-CM | POA: Diagnosis not present

## 2020-06-07 DIAGNOSIS — R2689 Other abnormalities of gait and mobility: Secondary | ICD-10-CM | POA: Diagnosis not present

## 2020-06-07 NOTE — Telephone Encounter (Signed)
   [name] from [facility] following up the referral sent to neurologist, she said they called Dr. Amparo Bristol office and was told still waiting for information from Dr. Tamala Julian, she wasn't sure what kind of additional information but she said pt needs to be seen at neurologist as soon as possible.

## 2020-06-07 NOTE — Telephone Encounter (Signed)
Spoke with Manuela Schwartz at [facility] and she states they spoke with Dr. Amparo Bristol office and they said Dr. Thompson Caul note was incomplete and they needed to see it to see if appt was more urgent. Note is complete.  Submitted new referral and marked it as urgent as we do want pt seen sooner than February if possible.  Manuela Schwartz appreciative for call.

## 2020-06-08 DIAGNOSIS — R296 Repeated falls: Secondary | ICD-10-CM | POA: Diagnosis not present

## 2020-06-08 DIAGNOSIS — R278 Other lack of coordination: Secondary | ICD-10-CM | POA: Diagnosis not present

## 2020-06-08 DIAGNOSIS — R41841 Cognitive communication deficit: Secondary | ICD-10-CM | POA: Diagnosis not present

## 2020-06-08 DIAGNOSIS — I5033 Acute on chronic diastolic (congestive) heart failure: Secondary | ICD-10-CM | POA: Diagnosis not present

## 2020-06-08 DIAGNOSIS — E871 Hypo-osmolality and hyponatremia: Secondary | ICD-10-CM | POA: Diagnosis not present

## 2020-06-08 DIAGNOSIS — M6389 Disorders of muscle in diseases classified elsewhere, multiple sites: Secondary | ICD-10-CM | POA: Diagnosis not present

## 2020-06-08 DIAGNOSIS — R2689 Other abnormalities of gait and mobility: Secondary | ICD-10-CM | POA: Diagnosis not present

## 2020-06-09 ENCOUNTER — Non-Acute Institutional Stay (SKILLED_NURSING_FACILITY): Payer: Medicare Other | Admitting: Adult Health

## 2020-06-09 DIAGNOSIS — G9340 Encephalopathy, unspecified: Secondary | ICD-10-CM | POA: Diagnosis not present

## 2020-06-09 DIAGNOSIS — R410 Disorientation, unspecified: Secondary | ICD-10-CM | POA: Diagnosis not present

## 2020-06-09 DIAGNOSIS — N3020 Other chronic cystitis without hematuria: Secondary | ICD-10-CM

## 2020-06-09 DIAGNOSIS — R2689 Other abnormalities of gait and mobility: Secondary | ICD-10-CM | POA: Diagnosis not present

## 2020-06-09 DIAGNOSIS — R278 Other lack of coordination: Secondary | ICD-10-CM | POA: Diagnosis not present

## 2020-06-09 DIAGNOSIS — M6389 Disorders of muscle in diseases classified elsewhere, multiple sites: Secondary | ICD-10-CM | POA: Diagnosis not present

## 2020-06-09 DIAGNOSIS — R296 Repeated falls: Secondary | ICD-10-CM | POA: Diagnosis not present

## 2020-06-09 DIAGNOSIS — I5033 Acute on chronic diastolic (congestive) heart failure: Secondary | ICD-10-CM | POA: Diagnosis not present

## 2020-06-09 DIAGNOSIS — R41841 Cognitive communication deficit: Secondary | ICD-10-CM | POA: Diagnosis not present

## 2020-06-10 ENCOUNTER — Other Ambulatory Visit: Payer: Self-pay | Admitting: Internal Medicine

## 2020-06-10 ENCOUNTER — Encounter: Payer: Self-pay | Admitting: Internal Medicine

## 2020-06-10 ENCOUNTER — Encounter: Payer: Self-pay | Admitting: Adult Health

## 2020-06-10 ENCOUNTER — Other Ambulatory Visit (HOSPITAL_COMMUNITY): Payer: Self-pay | Admitting: Internal Medicine

## 2020-06-10 DIAGNOSIS — G9340 Encephalopathy, unspecified: Secondary | ICD-10-CM | POA: Insufficient documentation

## 2020-06-10 DIAGNOSIS — R2689 Other abnormalities of gait and mobility: Secondary | ICD-10-CM | POA: Diagnosis not present

## 2020-06-10 DIAGNOSIS — R278 Other lack of coordination: Secondary | ICD-10-CM | POA: Diagnosis not present

## 2020-06-10 DIAGNOSIS — I5033 Acute on chronic diastolic (congestive) heart failure: Secondary | ICD-10-CM | POA: Diagnosis not present

## 2020-06-10 DIAGNOSIS — R296 Repeated falls: Secondary | ICD-10-CM | POA: Diagnosis not present

## 2020-06-10 DIAGNOSIS — R41841 Cognitive communication deficit: Secondary | ICD-10-CM | POA: Diagnosis not present

## 2020-06-10 DIAGNOSIS — M6389 Disorders of muscle in diseases classified elsewhere, multiple sites: Secondary | ICD-10-CM | POA: Diagnosis not present

## 2020-06-10 DIAGNOSIS — N390 Urinary tract infection, site not specified: Secondary | ICD-10-CM | POA: Diagnosis not present

## 2020-06-10 NOTE — Addendum Note (Signed)
Addended by: Barnie Mort on: 06/10/2020 11:13 AM   Modules accepted: Level of Service

## 2020-06-10 NOTE — Progress Notes (Addendum)
Location:  Occupational psychologist of Service:  SNF (31) Provider:   Cindi Carbon, ANP [place] [phone]   Gayland Curry, DO  Patient Care Team: Gayland Curry, DO as PCP - General (Geriatric Medicine) Belva Crome, MD as PCP - Cardiology (Cardiology) Constance Haw, MD as PCP - Electrophysiology (Cardiology)  Extended Emergency Contact Information Primary Emergency Contact: Berneda Rose Address: [address]          [place] Phone: [phone] Mobile Phone: [phone] Relation: Son Secondary Emergency Contact: Ron [place] Phone: [phone] Mobile Phone: [phone] Relation: Son  Code Status:  DNR Goals of care: Advanced Directive information Advanced Directives 05/31/2020  Does Patient Have a Medical Advance Directive? Yes  Type of Advance Directive Out of facility DNR (pink MOST or yellow form)  Does patient want to make changes to medical advance directive? No - Patient declined  Copy of [place] in Chart? -  Would patient like information on creating a medical advance directive? -  Pre-existing out of facility DNR order (yellow form or pink MOST form) Pink MOST form placed in chart (order not valid for inpatient use)     Chief Complaint  Patient presents with  . Acute Visit    agitated at night, dysuria     HPI:  Pt is a 86 y.o. female seen today for an acute visit for agitation at night and dysuria. Ms. Tuohy currently is in rehab  due to falls, left sided weakness, and increased care needs. Admitted to the hospital 05/22/20-05/25/20 for CHF and was diuresed. Then on 11/19 she was sent out for left sided weakness. This resolved on the way over apparently as these symptoms were not present on exam and her head CT was negative. She was started on Keflex for a UTI in the ED. They could not do an MRI due to the fact she was  wearing a heart monitor for afib. The monitor has be discontinued.   Since returning from the ER for left sided weakness she has had agitation at night and restlessness. She keeps getting up, changing clothes, seems more confused and anxious. Then in the morning she is more mentally clear and calmer.  She is taking melatonin at night for sleep but its night helping. She likes to take natural supplements. She continues with poor balance and poor coordination to the left hand but is walking more and gaining strength with therapy.   She has not had a fever, bladder pain, back pain, purulent urine etc. She has reported dysuria and frequency. She has a hx of chronic cystitis and these symptoms are not new. She reports now the symptoms have resolved after drinking water and taking d mannose. She fell two days ago due to poor balance and has a bruise to the side of her face.   In review on 11/19 when she went to the ER for left sided weakness a random urine was obtained which grew 100,000 colonies of proteus and 100,000 colonies of Klebsiella.  She did complete a full course of Keflex.  She did not have urinary symptoms at the time of the urine and her left sided lack of coordination has persisted and is presumable related to a CVA  Also she had an elevated bilirubin during her hospital stay but the recheck here at [facility] shows resolution.  Past Medical History:  Diagnosis Date  . Anxiety   .  Arthritis    Some in neck and back, but "can't complain"  . Atypical atrial flutter ([place])   . Breast cancer ([place])   . Cancer [facility])    1998 Right breast cancer with lumpectomy and radiation therapy  . Chronic diastolic CHF (congestive heart failure) ([place])   . Complication of anesthesia    Nausea from versed  . GERD (gastroesophageal reflux disease)   . Glaucoma   . Hiatal hernia   . History of mitral valve repair    Mitral valve prolapse, surgery in 2010  . Hyponatremia   . Normocytic anemia 03/21/2014  .  Persistent atrial fibrillation ([place])    s/p MAZE 2010  . Personal history of radiation therapy   . Rheumatic fever    age 70  . Thrombocytopenia ([place])    Past Surgical History:  Procedure Laterality Date  . ABDOMINAL HYSTERECTOMY    . APPENDECTOMY    . BACK SURGERY     Synovial cyst on spine, removed laproscopically in 2000?  [name] BREAST BIOPSY    . BREAST LUMPECTOMY     right 1998  . BREAST SURGERY     1998 Lumpectomy in right breast  . CARDIAC CATHETERIZATION    . CARDIOVERSION  04/29/2012   Procedure: CARDIOVERSION;  Surgeon: [name], MD;  Location: [facility] ENDOSCOPY;  Service: Cardiovascular;  Laterality: N/A;  . CARDIOVERSION  06/19/2012   Procedure: CARDIOVERSION;  Surgeon: Sinclair Grooms, MD;  Location: [facility] ENDOSCOPY;  Service: Cardiovascular;  Laterality: N/A;  h/p from 11/8 in file drawer/dl  . CATARACT EXTRACTION    . CESAREAN SECTION     Three C-Sections  . EYE SURGERY     Cataract Surgery, both eyes  . KIDNEY CYST REMOVAL    . MITRAL VALVE REPAIR  June 2010  . TEE WITHOUT CARDIOVERSION  04/29/2012   Procedure: TRANSESOPHAGEAL ECHOCARDIOGRAM (TEE);  Surgeon: [name], MD;  Location[facility] END[facility]COPY;  Service: Cardiovascular;  Laterality: N/A;  . TONSILLECTOMY      Allergies  Allergen Reactions  . Amiodarone Other (See Comments)    "Sick"- "allergic," per MAR  . Amoxicillin Nausea And Vomiting  . Amoxicillin-Pot Clavulanate Nausea And Vomiting  . Aspirin Other (See Comments)    Unknown... 325 mg and more.  Elsie Amis Other (See Comments)    "Allergic," per MAR  . Ciprofloxacin Other (See Comments)    Flu symptoms and "Allergic," per MAR  . Codeine Nausea And Vomiting and Other (See Comments)    "Allergic," per MAR  . Dairycare [Lactase-Lactobacillus] Other (See Comments)    "Allergic," per MAR  . Diuretic [Buchu-Cornsilk-Ch Grass-Hydran] Other (See Comments)    Per Pharmacy record  . Fioricet-Codeine [Butalbital-Apap-Caff-Cod] Nausea And Vomiting and  Other (See Comments)    "Allergic," per MAR  . Guaifenesin & Derivatives Other (See Comments)    "Allergic," per MAR  . Iohexol Hives     Code: HIVES, Desc: **11/22/08 **NO REACTION W/ OMNI 300// PT S/P 3 CT SCANS W/ IV CM AND NO HIVES/MMS**PT STATED HIVES 16 YRS AGO. POSSIBLE IV DYE W/ CT/XRAY. MEDICATED W/ 50MG  OF BENEDRYL PO PRIOR TO SCAN, Onset Date: [id number]   . Lisinopril Other (See Comments) and Cough    "Allergic," per MAR  . Midazolam Hcl Nausea And Vomiting  . Milk-Related Compounds Other (See Comments)    "Allergic," per MAR  . Quinolones Other (See Comments)    Flu like symptoms  . Sotalol Other (See Comments)    "Allergic,"  per [facility]  . Sulfa Antibiotics Other (See Comments)    Flu-like symptoms 'serum sickness' and "Allergic," per MAR  . Thioxanthenes Other (See Comments)    Flu like symptoms 'serum sickness'  . Valium Nausea And Vomiting and Other (See Comments)    "Allergic," per MAR  . Vancomycin Other (See Comments)    "Allergic," per MAR  . Versed [Midazolam] Nausea And Vomiting and Other (See Comments)    "Allergic," per MAR  . Vinegar [Acetic Acid] Other (See Comments)    "Allergic," per [facility]    Outpatient Encounter Medications as of 06/09/2020  Medication Sig  . acetaminophen (TYLENOL) 500 MG tablet Take 500 mg by mouth 3 (three) times daily as needed for mild pain, fever or headache.   [name] apixaban (ELIQUIS) 2.5 MG TABS tablet Take 2.5 mg by mouth 2 (two) times daily.   Christiane Ha Seed POWD Take 1 Scoop by mouth daily as needed (in morning smoothies for health benefits).   . D-Mannose POWD Take 5 mLs by mouth See admin instructions. Mix 5 ml's of reconstituted powder in a glass of juice daily and drink  . estradiol (ESTRACE) 0.1 MG/GM vaginal cream Place 1 g vaginally See admin instructions. Apply 1 gram vaginally on Tuesday and Friday nights  . Flaxseed, Linseed, (FLAX SEEDS) POWD Take by mouth See admin instructions. Mix 2 spoonfuls in smoothie every morning  .  furosemide (LASIX) 40 MG tablet Take 1 tablet (40 mg total) by mouth daily. Take one tablet (40 mg) by mouth every evening for 3 days - 05/21/2020 thru 05/23/2020  . Melatonin 1 MG SUBL Place 2 sprays under the tongue at bedtime. 3 mg qhs  . metoprolol tartrate (LOPRESSOR) 25 MG tablet Take 25 mg by mouth daily. Take 25 mg PO at bedtime for CHF  . metoprolol tartrate (LOPRESSOR) 50 MG tablet Take 50 mg by mouth daily. Special Instruction: For CHF Once a morning; 8:00am- 9:00 am  . Multiple Vitamins-Minerals (LUTEIN-ZEAXANTHIN) TABS Take 1 capsule by mouth in the morning.  . NON FORMULARY Take 2 capsules by mouth See admin instructions. Arthri-D3 dietary supplement (glucosamine with key plant extracts)- Take 2 capsules by mouth in the morning  . Omega-3 Fatty Acids (FISH OIL) 1000 MG CAPS Take 2,000 mg by mouth daily.  . ondansetron (ZOFRAN) 4 MG tablet Take 4 mg by mouth every 6 (six) hours as needed for nausea or vomiting.   [name] OVER THE COUNTER MEDICATION Take 1 tablet by mouth daily as needed (GI upset). Digest Gold  . polyethylene glycol (MIRALAX / GLYCOLAX) packet Take 17 g by mouth daily. Mix in water or beverage of choice  . potassium chloride SA (KLOR-CON) 20 MEQ tablet Take 1 tablet (20 mEq total) by mouth daily. Take one tablet (20 meq) by mouth every evening for 3 days - 05/21/2020 thru 05/23/2020  . Probiotic Product (PROBIOTIC PO) Take 1 capsule by mouth daily.  [name] senna-docusate (SENNA S) 8.6-50 MG tablet Take 1 tablet by mouth See admin instructions. Take 1 tablet by mouth daily and hold for loose stools  . Ubiquinol 100 MG CAPS Take 100 mg by mouth daily.  . vitamin B-12 (CYANOCOBALAMIN) 500 MCG tablet Take 500 mcg by mouth daily.  . vitamin C (ASCORBIC ACID) 250 MG tablet Take 750 mg by mouth daily.   No facility-administered encounter medications on file as of 06/09/2020.    Review of Systems  Constitutional: Positive for activity change. Negative for appetite change, chills,  diaphoresis, fatigue, fever  and unexpected weight change.  HENT: Negative for congestion.   Respiratory: Negative for cough, shortness of breath and wheezing.   Cardiovascular: Negative for chest pain, palpitations and leg swelling.  Gastrointestinal: Negative for abdominal distention, abdominal pain, constipation and diarrhea.  Genitourinary: Positive for frequency (chronic ). Negative for decreased urine volume, difficulty urinating, dysuria, flank pain and urgency.  Musculoskeletal: Negative for arthralgias, back pain, gait problem, joint swelling and myalgias.  Neurological: Negative for dizziness (intermittent), tremors, seizures, syncope, facial asymmetry, speech difficulty, weakness, light-headedness, numbness and headaches.       Poor coordination   Psychiatric/Behavioral: Positive for agitation, behavioral problems and confusion.    Immunization History  Administered Date(s) Administered  . Influenza Split 04/28/2012  . Moderna SARS-COVID-2 Vaccination 07/19/2019, 08/18/2019  . Zoster Recombinat (Shingrix) 03/31/2019   Pertinent  Health Maintenance Due  Topic Date Due  . DEXA SCAN  Never done  . PNA vac Low Risk Adult (1 of 2 - PCV13) Never done  . INFLUENZA VACCINE  02/07/2020   Fall Risk  03/15/2016  Falls in the past year? Yes  Comment Emmi Telephone Survey: data to providers prior to load  Number falls in past yr: 1  Comment Emmi Telephone Survey Actual Response = 1  Injury with Fall? No   Functional Status Survey:    Vitals:   06/10/20 0734  BP: (!) 124/51  Pulse: 74  Resp: 16  Temp: (!) 97.1 F (36.2 C)   There is no height or weight on file to calculate BMI. Physical Exam Vitals and nursing note reviewed.  Constitutional:      General: She is not in acute distress.    Appearance: She is not diaphoretic.  HENT:     Head: Normocephalic and atraumatic.     Mouth/Throat:     Mouth: Mucous membranes are moist.     Pharynx: Oropharynx is clear. No  oropharyngeal exudate.  Neck:     Vascular: No JVD.  Cardiovascular:     Rate and Rhythm: Normal rate. Rhythm irregular.     Heart sounds: No murmur heard.   Pulmonary:     Effort: Pulmonary effort is normal. No respiratory distress.     Breath sounds: Normal breath sounds. No wheezing.  Abdominal:     General: Bowel sounds are normal. There is no distension.     Palpations: Abdomen is soft.     Tenderness: There is no abdominal tenderness. There is no right CVA tenderness or left CVA tenderness.  Skin:    General: Skin is warm and dry.  Neurological:     Mental Status: She is alert and oriented to person, place, and time.     Cranial Nerves: No cranial nerve deficit.     Coordination: Coordination abnormal (left hand ).  Psychiatric:        Mood and Affect: Mood normal.     Labs reviewed: Recent Labs    05/22/20 1408 05/22/20 1408 05/23/20 0225 05/24/20 0444 05/25/20 0248 05/27/20 1440  NA 130*   < >  --  132* 131* 133*  K 3.6   < >  --  3.6 3.4* 3.5  CL 95*   < >  --  90* 91* 88*  CO2 23   < >  --  25 26 31   GLUCOSE 105*   < >  --  107* 117* 148*  BUN 17   < >  --  14 16 19   CREATININE 0.77   < >  --  0.82 0.82 0.89  CALCIUM 8.1*   < >  --  9.0 8.9 9.2  MG 1.7  --  1.7  --   --   --   PHOS  --   --  3.3  --   --   --    < > = values in this interval not displayed.   Recent Labs    05/23/20 0404 05/24/20 0444 05/25/20 0248  AST 32 33 33  ALT 22 22 24   ALKPHOS 72 79 80  BILITOT 2.1* 2.2* 2.4*  PROT 6.5 6.7 6.3*  ALBUMIN 3.5 3.5 3.4*   Recent Labs    05/24/20 0444 05/25/20 0248 05/27/20 1440  WBC 6.8 7.6 7.9  NEUTROABS 4.9 5.6  --   HGB 12.5 12.2 13.9  HCT 37.2 36.5 42.1  MCV 104.2* 104.6* 110.5*  PLT 76* 75* 81*   Lab Results  Component Value Date   TSH 3.040 01/28/2018   Lab Results  Component Value Date   HGBA1C  12/14/2008    5.2 (NOTE) The ADA recommends the following therapeutic goal for glycemic control related to Hgb A1c  measurement: Goal of therapy: <6.5 Hgb A1c  Reference: American Diabetes Association: Clinical Practice Recommendations 2010, Diabetes Care, 2010, 33: (Suppl  1).   No results found for: CHOL, HDL, LDLCALC, LDLDIRECT, TRIG, CHOLHDL  Significant Diagnostic Results in last 30 days:  CT Head Wo Contrast  Result Date: 05/27/2020 CLINICAL DATA:  Left hand weakness EXAM: CT HEAD WITHOUT CONTRAST TECHNIQUE: Contiguous axial images were obtained from the base of the skull through the vertex without intravenous contrast. COMPARISON:  None. FINDINGS: Brain: No evidence of acute territorial infarction, hemorrhage, hydrocephalus,extra-axial collection or mass lesion/mass effect. There is dilatation the ventricles and sulci consistent with age-related atrophy. Low-attenuation changes in the deep white matter consistent with small vessel ischemia. Vascular: No hyperdense vessel or unexpected calcification. Skull: The skull is intact. No fracture or focal lesion identified. Sinuses/Orbits: The visualized paranasal sinuses and mastoid air cells are clear. The orbits and globes intact. Other: None IMPRESSION: No acute intracranial abnormality. Findings consistent with age related atrophy and chronic small vessel ischemia Electronically Signed   By: Prudencio Pair M.D.   On: 05/27/2020 17:06   CT Head Wo Contrast  Result Date: 05/15/2020 CLINICAL DATA:  Fall 3 days ago EXAM: CT HEAD WITHOUT CONTRAST TECHNIQUE: Contiguous axial images were obtained from the base of the skull through the vertex without intravenous contrast. COMPARISON:  None. FINDINGS: Brain: No evidence of acute territorial infarction, hemorrhage, hydrocephalus,extra-axial collection or mass lesion/mass effect. There is dilatation the ventricles and sulci consistent with age-related atrophy. Low-attenuation changes in the deep white matter consistent with small vessel ischemia. There appears to be a probable prior lacunar infarct involving the left insular  cortex. Vascular: No hyperdense vessel or unexpected calcification. Skull: The skull is intact. No fracture or focal lesion identified. Sinuses/Orbits: The visualized paranasal sinuses and mastoid air cells are clear. The orbits and globes intact. Other: None Cervical spine: Alignment: There is a mild leftward curvature of the cervical spine. Skull base and vertebrae: Visualized skull base is intact. No atlanto-occipital dissociation. The vertebral body heights are well maintained. No fracture or pathologic osseous lesion seen. Soft tissues and spinal canal: The visualized paraspinal soft tissues are unremarkable. No prevertebral soft tissue swelling is seen. The spinal canal is grossly unremarkable, no large epidural collection or significant canal narrowing. Disc levels: Cervical spine spondylosis is noted with disc osteophyte complex and uncovertebral  osteophytes most notable at C5-C6 and C6-C7 with severe neural foraminal narrowing and mild central canal stenosis. Upper chest: Biapical scarring is seen. Thoracic inlet is within normal limits. Other: None IMPRESSION: No acute intracranial abnormality. Findings consistent with age related atrophy and chronic small vessel ischemia Probable prior lacunar infarct within the left insular cortex. No acute fracture or malalignment of the spine. Electronically Signed   By: Prudencio Pair M.D.   On: 05/15/2020 15:05   CT Cervical Spine Wo Contrast  Result Date: 05/15/2020 CLINICAL DATA:  Fall 3 days ago EXAM: CT HEAD WITHOUT CONTRAST TECHNIQUE: Contiguous axial images were obtained from the base of the skull through the vertex without intravenous contrast. COMPARISON:  None. FINDINGS: Brain: No evidence of acute territorial infarction, hemorrhage, hydrocephalus,extra-axial collection or mass lesion/mass effect. There is dilatation the ventricles and sulci consistent with age-related atrophy. Low-attenuation changes in the deep white matter consistent with small vessel  ischemia. There appears to be a probable prior lacunar infarct involving the left insular cortex. Vascular: No hyperdense vessel or unexpected calcification. Skull: The skull is intact. No fracture or focal lesion identified. Sinuses/Orbits: The visualized paranasal sinuses and mastoid air cells are clear. The orbits and globes intact. Other: None Cervical spine: Alignment: There is a mild leftward curvature of the cervical spine. Skull base and vertebrae: Visualized skull base is intact. No atlanto-occipital dissociation. The vertebral body heights are well maintained. No fracture or pathologic osseous lesion seen. Soft tissues and spinal canal: The visualized paraspinal soft tissues are unremarkable. No prevertebral soft tissue swelling is seen. The spinal canal is grossly unremarkable, no large epidural collection or significant canal narrowing. Disc levels: Cervical spine spondylosis is noted with disc osteophyte complex and uncovertebral osteophytes most notable at C5-C6 and C6-C7 with severe neural foraminal narrowing and mild central canal stenosis. Upper chest: Biapical scarring is seen. Thoracic inlet is within normal limits. Other: None IMPRESSION: No acute intracranial abnormality. Findings consistent with age related atrophy and chronic small vessel ischemia Probable prior lacunar infarct within the left insular cortex. No acute fracture or malalignment of the spine. Electronically Signed   By: Prudencio Pair M.D.   On: 05/15/2020 15:05   DG Chest Port 1 View  Result Date: 05/27/2020 CLINICAL DATA:  Fall EXAM: PORTABLE CHEST 1 VIEW COMPARISON:  May 22, 2020 FINDINGS: The heart size and mediastinal contours are mildly enlarged. Aortic knob calcifications are seen. No large airspace consolidation or pleural effusion. Healed rib fractures are seen in the right lower chest. IMPRESSION: No active disease. Electronically Signed   By: Prudencio Pair M.D.   On: 05/27/2020 18:01   DG Chest Portable 1  View  Result Date: 05/22/2020 CLINICAL DATA:  Shortness of breath with lower extremity edema EXAM: PORTABLE CHEST 1 VIEW COMPARISON:  Nov 17, 2016 FINDINGS: There is a small left pleural effusion with airspace opacity in the left base. There is subtle ill-defined opacity in the right mid lung. Lungs elsewhere are clear. There is cardiomegaly with pulmonary vascularity normal. No adenopathy. There is aortic atherosclerosis. No bone lesions. There is mitral annulus calcification. IMPRESSION: Stable cardiomegaly. Left pleural effusion with left lower lobe consolidation, likely due to combination of atelectasis and pneumonia. Subtle ill-defined opacity right mid lung likely a second focus of pneumonia. Aortic Atherosclerosis (ICD10-I70.0). Electronically Signed   By: Lowella Grip III M.D.   On: 05/22/2020 14:22   ECHOCARDIOGRAM COMPLETE  Result Date: 05/23/2020    ECHOCARDIOGRAM REPORT   Patient Name:   [facility]  [place] Date of Exam: 05/23/2020 Medical Rec #:  [id number]     Height:       62.0 in Accession #:    [id number]    Weight:       122.8 lb Date of Birth:  [birth year]      BSA:          1.554 m Patient Age:    73 years      BP:           129/67 mmHg Patient Gender: F             HR:           71 bpm. Exam Location:  Inpatient Procedure: 2D Echo, Cardiac Doppler and Color Doppler Indications:    CHF-Acute Systolic 683.41 / D62.22  History:        Patient has prior history of Echocardiogram examinations, most                 recent 09/10/2012. CHF, Arrythmias:Atrial Fibrillation and Atrial                 Flutter, Signs/Symptoms:Shortness of Breath; Risk                 Factors:Non-Smoker. GERD.                  Mitral Valve: prosthetic annuloplasty ring valve is present in                 the mitral position. Procedure Date: 12/2008.  Sonographer:    Vickie Epley RDCS Referring Phys: [id number] OLADAPO ADEFESO IMPRESSIONS  1. Left ventricular ejection fraction, by estimation, is 50 to 55%. The left ventricle has  low normal function. The left ventricle has no regional wall motion abnormalities. Left ventricular diastolic parameters are consistent with Grade II diastolic dysfunction (pseudonormalization). There is the interventricular septum is flattened in systole and diastole, consistent with right ventricular pressure and volume overload.  2. Right ventricular systolic function is normal. The right ventricular size is normal. There is severely elevated pulmonary artery systolic pressure. The estimated right ventricular systolic pressure is 19.4 mmHg.  3. Left atrial size was severely dilated.  4. Right atrial size was severely dilated.  5. The mitral valve has been repaired/replaced. Mild to moderate mitral valve regurgitation. No evidence of mitral stenosis. There is a prosthetic annuloplasty ring present in the mitral position. Procedure Date: 12/2008.  6. The aortic valve is tricuspid. Aortic valve regurgitation is moderate to severe. No aortic stenosis is present.  7. The inferior vena cava is normal in size with greater than 50% respiratory variability, suggesting right atrial pressure of 3 mmHg. Comparison(s): Prior images unable to be directly viewed, comparison made by report only. Prior EF 50-55% in 2014 report. Aortic regurgitation has advanced from trivial. FINDINGS  Left Ventricle: Left ventricular ejection fraction, by estimation, is 50 to 55%. The left ventricle has low normal function. The left ventricle has no regional wall motion abnormalities. The left ventricular internal cavity size was normal in size. There is no left ventricular hypertrophy. The interventricular septum is flattened in systole and diastole, consistent with right ventricular pressure and volume overload. Left ventricular diastolic parameters are consistent with Grade II diastolic dysfunction (pseudonormalization). Right Ventricle: The right ventricular size is normal. No increase in right ventricular wall thickness. Right ventricular  systolic function is normal. There is severely elevated pulmonary artery systolic pressure. The tricuspid regurgitant velocity is 3.60 m/s, and with  an assumed right atrial pressure of 15 mmHg, the estimated right ventricular systolic pressure is 29.7 mmHg. Left Atrium: Left atrial size was severely dilated. Right Atrium: Right atrial size was severely dilated. Pericardium: There is no evidence of pericardial effusion. Mitral Valve: The mitral valve has been repaired/replaced. Mild to moderate mitral valve regurgitation, with anteriorly-directed jet. There is a prosthetic annuloplasty ring present in the mitral position. Procedure Date: 12/2008. No evidence of mitral valve stenosis. MV peak gradient, 12.8 mmHg. The mean mitral valve gradient is 4.0 mmHg. Tricuspid Valve: The tricuspid valve is normal in structure. Tricuspid valve regurgitation is not demonstrated. No evidence of tricuspid stenosis. Aortic Valve: The aortic valve is tricuspid. Aortic valve regurgitation is moderate to severe. Aortic regurgitation PHT measures 518 msec. No aortic stenosis is present. Pulmonic Valve: The pulmonic valve was normal in structure. Pulmonic valve regurgitation is mild to moderate. No evidence of pulmonic stenosis. Aorta: The aortic root is normal in size and structure. Venous: The inferior vena cava is normal in size with greater than 50% respiratory variability, suggesting right atrial pressure of 3 mmHg. IAS/Shunts: No atrial level shunt detected by color flow Doppler.  LEFT VENTRICLE PLAX 2D LVIDd:         3.80 cm     Diastology LVIDs:         2.80 cm     LV e' medial:    3.88 cm/s LV PW:         0.90 cm     LV E/e' medial:  35.6 LV IVS:        0.90 cm     LV e' lateral:   5.80 cm/s LVOT diam:     2.00 cm     LV E/e' lateral: 23.8 LV SV:         52 LV SV Index:   33 LVOT Area:     3.14 cm  LV Volumes (MOD) LV vol d, MOD A2C: 80.4 ml LV vol d, MOD A4C: 77.8 ml LV vol s, MOD A2C: 35.1 ml LV vol s, MOD A4C: 39.6 ml LV SV  MOD A2C:     45.3 ml LV SV MOD A4C:     77.8 ml LV SV MOD BP:      41.2 ml RIGHT VENTRICLE RV S prime:     9.38 cm/s TAPSE (M-mode): 1.6 cm LEFT ATRIUM             Index       RIGHT ATRIUM           Index LA diam:        5.50 cm 3.54 cm/m  RA Area:     28.90 cm LA Vol (A2C):   78.0 ml 50.20 ml/m RA Volume:   98.40 ml  63.33 ml/m LA Vol (A4C):   89.2 ml 57.41 ml/m LA Biplane Vol: 85.8 ml 55.22 ml/m  AORTIC VALVE LVOT Vmax:   80.90 cm/s LVOT Vmean:  54.800 cm/s LVOT VTI:    0.165 m AI PHT:      518 msec  AORTA Ao Asc diam: 3.30 cm MITRAL VALVE                TRICUSPID VALVE MV Area (PHT): 7.16 cm     TR Peak grad:   51.8 mmHg MV Peak grad:  12.8 mmHg    TR Vmax:        360.00 cm/s MV Mean grad:  4.0 mmHg MV Vmax:  1.79 m/s     SHUNTS MV Vmean:      84.4 cm/s    Systemic VTI:  0.16 m MV Decel Time: 106 msec     Systemic Diam: 2.00 cm MR Peak grad: 87.6 mmHg MR Mean grad: 60.0 mmHg MR Vmax:      468.00 cm/s MR Vmean:     362.0 cm/s MV E velocity: 138.00 cm/s MV A velocity: 78.20 cm/s MV E/A ratio:  1.76 Candee Furbish MD Electronically signed by Candee Furbish MD Signature Date/Time: 05/23/2020/11:32:20 AM    Final     Assessment/Plan 1. Delirium Due to possible CVA, change of environment, and recent hospitalization. Vitals are stable, recent labs are WNL and this has been an issue since she returned from the hospital. Neuro follow up pending input. She has fallen in rehab and continues to be a fall risk and so I would like to avoid benzos unless absolutely necessary. The patient would like to continue to take melatonin at night and avoid stronger meds at this time.    2. Chronic cystitis She is not currently having symptoms of dysuria or frequency but did the night prior. Since symptoms have resolved and she has chronic bacteruria (with resistant organism) would not check UA unless symptoms are present for UTI to avoid over use of antibiotics   3. Cerebellar dysfunction Continues with lack of  coordination in the left hand and poor balance but has made mild improvements with therapy. Pending Neurology apt.   I spoke with her son [name]. He has spoke with Dr. Sherwood Gambler and has recommended an MRI. He recognized that it would not change her care or trajectory at this point but would possibly provide clarity and guidance. She is no longer on the heart monitor and so we can move forward with this. She has had contrast in the past (in 2015) with pre med of Benadryl. Will write an order for MRI of the brain with contrast with pre med Benadryl 25 mg prior  Family/ staff Communication: resident and son Moreen Fowler ordered:  UA of 2 symptoms present for UTI

## 2020-06-11 DIAGNOSIS — R2689 Other abnormalities of gait and mobility: Secondary | ICD-10-CM | POA: Diagnosis not present

## 2020-06-11 DIAGNOSIS — M6389 Disorders of muscle in diseases classified elsewhere, multiple sites: Secondary | ICD-10-CM | POA: Diagnosis not present

## 2020-06-11 DIAGNOSIS — R296 Repeated falls: Secondary | ICD-10-CM | POA: Diagnosis not present

## 2020-06-11 DIAGNOSIS — I5033 Acute on chronic diastolic (congestive) heart failure: Secondary | ICD-10-CM | POA: Diagnosis not present

## 2020-06-11 DIAGNOSIS — R278 Other lack of coordination: Secondary | ICD-10-CM | POA: Diagnosis not present

## 2020-06-11 DIAGNOSIS — R41841 Cognitive communication deficit: Secondary | ICD-10-CM | POA: Diagnosis not present

## 2020-06-12 DIAGNOSIS — I5033 Acute on chronic diastolic (congestive) heart failure: Secondary | ICD-10-CM | POA: Diagnosis not present

## 2020-06-12 DIAGNOSIS — R2689 Other abnormalities of gait and mobility: Secondary | ICD-10-CM | POA: Diagnosis not present

## 2020-06-12 DIAGNOSIS — R41841 Cognitive communication deficit: Secondary | ICD-10-CM | POA: Diagnosis not present

## 2020-06-12 DIAGNOSIS — M6389 Disorders of muscle in diseases classified elsewhere, multiple sites: Secondary | ICD-10-CM | POA: Diagnosis not present

## 2020-06-12 DIAGNOSIS — R296 Repeated falls: Secondary | ICD-10-CM | POA: Diagnosis not present

## 2020-06-12 DIAGNOSIS — R278 Other lack of coordination: Secondary | ICD-10-CM | POA: Diagnosis not present

## 2020-06-13 DIAGNOSIS — R41841 Cognitive communication deficit: Secondary | ICD-10-CM | POA: Diagnosis not present

## 2020-06-13 DIAGNOSIS — M6389 Disorders of muscle in diseases classified elsewhere, multiple sites: Secondary | ICD-10-CM | POA: Diagnosis not present

## 2020-06-13 DIAGNOSIS — R2689 Other abnormalities of gait and mobility: Secondary | ICD-10-CM | POA: Diagnosis not present

## 2020-06-13 DIAGNOSIS — R296 Repeated falls: Secondary | ICD-10-CM | POA: Diagnosis not present

## 2020-06-13 DIAGNOSIS — R278 Other lack of coordination: Secondary | ICD-10-CM | POA: Diagnosis not present

## 2020-06-13 DIAGNOSIS — I5033 Acute on chronic diastolic (congestive) heart failure: Secondary | ICD-10-CM | POA: Diagnosis not present

## 2020-06-14 DIAGNOSIS — R41841 Cognitive communication deficit: Secondary | ICD-10-CM | POA: Diagnosis not present

## 2020-06-14 DIAGNOSIS — R296 Repeated falls: Secondary | ICD-10-CM | POA: Diagnosis not present

## 2020-06-14 DIAGNOSIS — R2689 Other abnormalities of gait and mobility: Secondary | ICD-10-CM | POA: Diagnosis not present

## 2020-06-14 DIAGNOSIS — R278 Other lack of coordination: Secondary | ICD-10-CM | POA: Diagnosis not present

## 2020-06-14 DIAGNOSIS — I5033 Acute on chronic diastolic (congestive) heart failure: Secondary | ICD-10-CM | POA: Diagnosis not present

## 2020-06-14 DIAGNOSIS — M6389 Disorders of muscle in diseases classified elsewhere, multiple sites: Secondary | ICD-10-CM | POA: Diagnosis not present

## 2020-06-15 ENCOUNTER — Other Ambulatory Visit: Payer: Self-pay | Admitting: Interventional Cardiology

## 2020-06-15 DIAGNOSIS — R278 Other lack of coordination: Secondary | ICD-10-CM | POA: Diagnosis not present

## 2020-06-15 DIAGNOSIS — I5033 Acute on chronic diastolic (congestive) heart failure: Secondary | ICD-10-CM | POA: Diagnosis not present

## 2020-06-15 DIAGNOSIS — R2689 Other abnormalities of gait and mobility: Secondary | ICD-10-CM | POA: Diagnosis not present

## 2020-06-15 DIAGNOSIS — R42 Dizziness and giddiness: Secondary | ICD-10-CM

## 2020-06-15 DIAGNOSIS — M6389 Disorders of muscle in diseases classified elsewhere, multiple sites: Secondary | ICD-10-CM | POA: Diagnosis not present

## 2020-06-15 DIAGNOSIS — R296 Repeated falls: Secondary | ICD-10-CM | POA: Diagnosis not present

## 2020-06-15 DIAGNOSIS — I4819 Other persistent atrial fibrillation: Secondary | ICD-10-CM

## 2020-06-15 DIAGNOSIS — R41841 Cognitive communication deficit: Secondary | ICD-10-CM | POA: Diagnosis not present

## 2020-06-16 ENCOUNTER — Telehealth: Payer: Self-pay | Admitting: Neurology

## 2020-06-16 DIAGNOSIS — R278 Other lack of coordination: Secondary | ICD-10-CM | POA: Diagnosis not present

## 2020-06-16 DIAGNOSIS — R41841 Cognitive communication deficit: Secondary | ICD-10-CM | POA: Diagnosis not present

## 2020-06-16 DIAGNOSIS — I5033 Acute on chronic diastolic (congestive) heart failure: Secondary | ICD-10-CM | POA: Diagnosis not present

## 2020-06-16 DIAGNOSIS — M6389 Disorders of muscle in diseases classified elsewhere, multiple sites: Secondary | ICD-10-CM | POA: Diagnosis not present

## 2020-06-16 DIAGNOSIS — R296 Repeated falls: Secondary | ICD-10-CM | POA: Diagnosis not present

## 2020-06-16 DIAGNOSIS — R2689 Other abnormalities of gait and mobility: Secondary | ICD-10-CM | POA: Diagnosis not present

## 2020-06-16 NOTE — Telephone Encounter (Signed)
error 

## 2020-06-17 DIAGNOSIS — R296 Repeated falls: Secondary | ICD-10-CM | POA: Diagnosis not present

## 2020-06-17 DIAGNOSIS — R2689 Other abnormalities of gait and mobility: Secondary | ICD-10-CM | POA: Diagnosis not present

## 2020-06-17 DIAGNOSIS — I5033 Acute on chronic diastolic (congestive) heart failure: Secondary | ICD-10-CM | POA: Diagnosis not present

## 2020-06-17 DIAGNOSIS — R41841 Cognitive communication deficit: Secondary | ICD-10-CM | POA: Diagnosis not present

## 2020-06-17 DIAGNOSIS — M6389 Disorders of muscle in diseases classified elsewhere, multiple sites: Secondary | ICD-10-CM | POA: Diagnosis not present

## 2020-06-17 DIAGNOSIS — R278 Other lack of coordination: Secondary | ICD-10-CM | POA: Diagnosis not present

## 2020-06-18 DIAGNOSIS — R278 Other lack of coordination: Secondary | ICD-10-CM | POA: Diagnosis not present

## 2020-06-18 DIAGNOSIS — R2689 Other abnormalities of gait and mobility: Secondary | ICD-10-CM | POA: Diagnosis not present

## 2020-06-18 DIAGNOSIS — M6389 Disorders of muscle in diseases classified elsewhere, multiple sites: Secondary | ICD-10-CM | POA: Diagnosis not present

## 2020-06-18 DIAGNOSIS — R296 Repeated falls: Secondary | ICD-10-CM | POA: Diagnosis not present

## 2020-06-18 DIAGNOSIS — I5033 Acute on chronic diastolic (congestive) heart failure: Secondary | ICD-10-CM | POA: Diagnosis not present

## 2020-06-18 DIAGNOSIS — R41841 Cognitive communication deficit: Secondary | ICD-10-CM | POA: Diagnosis not present

## 2020-06-20 DIAGNOSIS — I5033 Acute on chronic diastolic (congestive) heart failure: Secondary | ICD-10-CM | POA: Diagnosis not present

## 2020-06-20 DIAGNOSIS — R296 Repeated falls: Secondary | ICD-10-CM | POA: Diagnosis not present

## 2020-06-20 DIAGNOSIS — M6389 Disorders of muscle in diseases classified elsewhere, multiple sites: Secondary | ICD-10-CM | POA: Diagnosis not present

## 2020-06-20 DIAGNOSIS — R41841 Cognitive communication deficit: Secondary | ICD-10-CM | POA: Diagnosis not present

## 2020-06-20 DIAGNOSIS — R278 Other lack of coordination: Secondary | ICD-10-CM | POA: Diagnosis not present

## 2020-06-20 DIAGNOSIS — R2689 Other abnormalities of gait and mobility: Secondary | ICD-10-CM | POA: Diagnosis not present

## 2020-06-21 DIAGNOSIS — R2689 Other abnormalities of gait and mobility: Secondary | ICD-10-CM | POA: Diagnosis not present

## 2020-06-21 DIAGNOSIS — I5033 Acute on chronic diastolic (congestive) heart failure: Secondary | ICD-10-CM | POA: Diagnosis not present

## 2020-06-21 DIAGNOSIS — R278 Other lack of coordination: Secondary | ICD-10-CM | POA: Diagnosis not present

## 2020-06-21 DIAGNOSIS — R296 Repeated falls: Secondary | ICD-10-CM | POA: Diagnosis not present

## 2020-06-21 DIAGNOSIS — M6389 Disorders of muscle in diseases classified elsewhere, multiple sites: Secondary | ICD-10-CM | POA: Diagnosis not present

## 2020-06-21 DIAGNOSIS — R41841 Cognitive communication deficit: Secondary | ICD-10-CM | POA: Diagnosis not present

## 2020-06-22 DIAGNOSIS — R296 Repeated falls: Secondary | ICD-10-CM | POA: Diagnosis not present

## 2020-06-22 DIAGNOSIS — R41841 Cognitive communication deficit: Secondary | ICD-10-CM | POA: Diagnosis not present

## 2020-06-22 DIAGNOSIS — I5033 Acute on chronic diastolic (congestive) heart failure: Secondary | ICD-10-CM | POA: Diagnosis not present

## 2020-06-22 DIAGNOSIS — M6389 Disorders of muscle in diseases classified elsewhere, multiple sites: Secondary | ICD-10-CM | POA: Diagnosis not present

## 2020-06-22 DIAGNOSIS — R2689 Other abnormalities of gait and mobility: Secondary | ICD-10-CM | POA: Diagnosis not present

## 2020-06-22 DIAGNOSIS — R278 Other lack of coordination: Secondary | ICD-10-CM | POA: Diagnosis not present

## 2020-06-23 ENCOUNTER — Other Ambulatory Visit: Payer: Self-pay

## 2020-06-23 ENCOUNTER — Encounter: Payer: Self-pay | Admitting: Adult Health

## 2020-06-23 ENCOUNTER — Ambulatory Visit
Admission: RE | Admit: 2020-06-23 | Discharge: 2020-06-23 | Disposition: A | Discharge status: Home / Self Care | Payer: Medicare Other | Source: Ambulatory Visit | Attending: Internal Medicine | Admitting: Internal Medicine

## 2020-06-23 ENCOUNTER — Non-Acute Institutional Stay (SKILLED_NURSING_FACILITY): Payer: Medicare Other | Admitting: Adult Health

## 2020-06-23 ENCOUNTER — Telehealth: Payer: Self-pay

## 2020-06-23 DIAGNOSIS — I639 Cerebral infarction, unspecified: Secondary | ICD-10-CM | POA: Diagnosis not present

## 2020-06-23 DIAGNOSIS — L299 Pruritus, unspecified: Secondary | ICD-10-CM | POA: Diagnosis not present

## 2020-06-23 DIAGNOSIS — R278 Other lack of coordination: Secondary | ICD-10-CM | POA: Diagnosis not present

## 2020-06-23 DIAGNOSIS — G4701 Insomnia due to medical condition: Secondary | ICD-10-CM

## 2020-06-23 DIAGNOSIS — I5033 Acute on chronic diastolic (congestive) heart failure: Secondary | ICD-10-CM | POA: Diagnosis not present

## 2020-06-23 DIAGNOSIS — R296 Repeated falls: Secondary | ICD-10-CM | POA: Diagnosis not present

## 2020-06-23 DIAGNOSIS — M6389 Disorders of muscle in diseases classified elsewhere, multiple sites: Secondary | ICD-10-CM | POA: Diagnosis not present

## 2020-06-23 DIAGNOSIS — R2689 Other abnormalities of gait and mobility: Secondary | ICD-10-CM | POA: Diagnosis not present

## 2020-06-23 DIAGNOSIS — G9340 Encephalopathy, unspecified: Secondary | ICD-10-CM

## 2020-06-23 DIAGNOSIS — R41841 Cognitive communication deficit: Secondary | ICD-10-CM | POA: Diagnosis not present

## 2020-06-23 MED ORDER — GADOBENATE DIMEGLUMINE 529 MG/ML IV SOLN
10.0000 mL | Freq: Once | INTRAVENOUS | Status: AC | PRN
  Administered 2020-06-23: 10 mL via INTRAVENOUS

## 2020-06-23 NOTE — Telephone Encounter (Signed)
Rodarte's MRI results.  Embolic strokes.

## 2020-06-23 NOTE — Progress Notes (Signed)
Location:  Occupational psychologist of Service:  SNF (31) Provider:   Cindi Carbon, ANP [place] [phone]  Gayland Curry, DO  Patient Care Team: Gayland Curry, DO as PCP - General (Geriatric Medicine) Belva Crome, MD as PCP - Cardiology (Cardiology) Constance Haw, MD as PCP - Electrophysiology (Cardiology)  Extended Emergency Contact Information Primary Emergency Contact: Ival [place] of [place] Phone: [phone] Mobile Phone: [phone] Relation: Son Secondary Emergency Contact: Berneda Rose Address: [address]          [place] Phone: [phone] Mobile Phone: [phone] Relation: Son  Code Status:  DNR Goals of care: Advanced Directive information Advanced Directives 05/31/2020  Does Patient Have a Medical Advance Directive? Yes  Type of Advance Directive Out of facility DNR (pink MOST or yellow form)  Does patient want to make changes to medical advance directive? No - Patient declined  Copy of [place] in Chart? -  Would patient like information on creating a medical advance directive? -  Pre-existing out of facility DNR order (yellow form or pink MOST form) Pink MOST form placed in chart (order not valid for inpatient use)     Chief Complaint  Patient presents with  . Acute Visit    Itching, insomnia, MRI results     HPI:  Pt is a 86 y.o. female seen today for an acute visit for f/u regarding MRI results, also complains of itching and not sleeping.  Ms Lemon has a hx of Afib and CHF. More recently she has had issues with balance and coordination of the left hand along present since 05/27/20. She is working with PT and remains ambulatory with a walker but needs more assistance now lives in skilled care.  MRI 12/16/2021showed small subacute infarcts scattered in the right greater than left cerebral hemispheres c/w emboli. Also small  acute to subacute right cerebellar infarct and mild chronic small vessel disease. Addendum mentions atherosclerosis and reduced flow of the distal vertebral arteries vs segmental occlusion.   For several weeks she has been restless at night and not sleeping well. She gets up regularly and has home care to help assist her due to her frequent attempts to get up without help. She has tried melatonin with no improvement. She is also experiencing itching to the back and legs. No wheezing or rash. These symptom have been present for several days per the staff. She scratched so much on her legs she left abrasions. She has been eating dairy but previously reported an allergy to this. Her son brought almond milk for her to try    Past Medical History:  Diagnosis Date  . Anxiety   . Arthritis    Some in neck and back, but "can't complain"  . Atypical atrial flutter ([place])   . Breast cancer ([place])   . Cancer [facility])    1998 Right breast cancer with lumpectomy and radiation therapy  . Chronic diastolic CHF (congestive heart failure) ([place])   . Complication of anesthesia    Nausea from versed  . GERD (gastroesophageal reflux disease)   . Glaucoma   . Hiatal hernia   . History of mitral valve repair    Mitral valve prolapse, surgery in 2010  . Hyponatremia   . Normocytic anemia 03/21/2014  . Persistent atrial fibrillation ([place])    s/p MAZE 2010  . Personal history of radiation therapy   . Rheumatic fever  age 36  . Thrombocytopenia ([place])    Past Surgical History:  Procedure Laterality Date  . ABDOMINAL HYSTERECTOMY    . APPENDECTOMY    . BACK SURGERY     Synovial cyst on spine, removed laproscopically in 2000?  [name] BREAST BIOPSY    . BREAST LUMPECTOMY     right 1998  . BREAST SURGERY     1998 Lumpectomy in right breast  . CARDIAC CATHETERIZATION    . CARDIOVERSION  04/29/2012   Procedure: CARDIOVERSION;  Surgeon: [name], MD;  Location: [facility] ENDOSCOPY;  Service: Cardiovascular;   Laterality: N/A;  . CARDIOVERSION  06/19/2012   Procedure: CARDIOVERSION;  Surgeon: Sinclair Grooms, MD;  Location: [facility] ENDOSCOPY;  Service: Cardiovascular;  Laterality: N/A;  h/p from 11/8 in file drawer/dl  . CATARACT EXTRACTION    . CESAREAN SECTION     Three C-Sections  . EYE SURGERY     Cataract Surgery, both eyes  . KIDNEY CYST REMOVAL    . MITRAL VALVE REPAIR  June 2010  . TEE WITHOUT CARDIOVERSION  04/29/2012   Procedure: TRANSESOPHAGEAL ECHOCARDIOGRAM (TEE);  Surgeon: [name], MD;  Location: [facility] ENDOSCOPY;  Service: Cardiovascular;  Laterality: N/A;  . TONSILLECTOMY      Allergies  Allergen Reactions  . Amiodarone Other (See Comments)    "Sick"- "allergic," per MAR  . Amoxicillin Nausea And Vomiting  . Amoxicillin-Pot Clavulanate Nausea And Vomiting  . Aspirin Other (See Comments)    Unknown... 325 mg and more.  Elsie Amis Other (See Comments)    "Allergic," per MAR  . Ciprofloxacin Other (See Comments)    Flu symptoms and "Allergic," per MAR  . Codeine Nausea And Vomiting and Other (See Comments)    "Allergic," per MAR  . Dairycare [Lactase-Lactobacillus] Other (See Comments)    "Allergic," per MAR  . Diuretic [Buchu-Cornsilk-Ch Grass-Hydran] Other (See Comments)    Per Pharmacy record  . Fioricet-Codeine [Butalbital-Apap-Caff-Cod] Nausea And Vomiting and Other (See Comments)    "Allergic," per MAR  . Guaifenesin & Derivatives Other (See Comments)    "Allergic," per MAR  . Iohexol Hives     Code: HIVES, Desc: **11/22/08 **NO REACTION W/ OMNI 300// PT S/P 3 CT SCANS W/ IV CM AND NO HIVES/MMS**PT STATED HIVES 16 YRS AGO. POSSIBLE IV DYE W/ CT/XRAY. MEDICATED W/ 50MG  OF BENEDRYL PO PRIOR TO SCAN, Onset Date: [id number]   . Lisinopril Other (See Comments) and Cough    "Allergic," per MAR  . Midazolam Hcl Nausea And Vomiting  . Milk-Related Compounds Other (See Comments)    "Allergic," per MAR  . Quinolones Other (See Comments)    Flu like symptoms  . Sotalol  Other (See Comments)    "Allergic," per MAR  . Sulfa Antibiotics Other (See Comments)    Flu-like symptoms 'serum sickness' and "Allergic," per MAR  . Thioxanthenes Other (See Comments)    Flu like symptoms 'serum sickness'  . Valium Nausea And Vomiting and Other (See Comments)    "Allergic," per MAR  . Vancomycin Other (See Comments)    "Allergic," per MAR  . Versed [Midazolam] Nausea And Vomiting and Other (See Comments)    "Allergic," per MAR  . Vinegar [Acetic Acid] Other (See Comments)    "Allergic," per [facility]    Outpatient Encounter Medications as of 06/23/2020  Medication Sig  . acetaminophen (TYLENOL) 500 MG tablet Take 500 mg by mouth 3 (three) times daily as needed for mild pain, fever or headache.   [name]  apixaban (ELIQUIS) 2.5 MG TABS tablet Take 2.5 mg by mouth 2 (two) times daily.   Christiane Ha Seed POWD Take 1 Scoop by mouth daily as needed (in morning smoothies for health benefits).   . D-Mannose POWD Take 5 mLs by mouth See admin instructions. Mix 5 ml's of reconstituted powder in a glass of juice daily and drink  . estradiol (ESTRACE) 0.1 MG/GM vaginal cream Place 1 g vaginally See admin instructions. Apply 1 gram vaginally on Tuesday and Friday nights  . Flaxseed, Linseed, (FLAX SEEDS) POWD Take by mouth See admin instructions. Mix 2 spoonfuls in smoothie every morning  . furosemide (LASIX) 40 MG tablet Take 1 tablet (40 mg total) by mouth daily. Take one tablet (40 mg) by mouth every evening for 3 days - 05/21/2020 thru 05/23/2020  . Melatonin 1 MG SUBL Place 2 sprays under the tongue at bedtime. 3 mg qhs  . metoprolol tartrate (LOPRESSOR) 25 MG tablet Take 25 mg by mouth daily. Take 25 mg PO at bedtime for CHF  . metoprolol tartrate (LOPRESSOR) 50 MG tablet Take 50 mg by mouth daily. Special Instruction: For CHF Once a morning; 8:00am- 9:00 am  . Multiple Vitamins-Minerals (LUTEIN-ZEAXANTHIN) TABS Take 1 capsule by mouth in the morning.  . NON FORMULARY Take 2 capsules by  mouth See admin instructions. Arthri-D3 dietary supplement (glucosamine with key plant extracts)- Take 2 capsules by mouth in the morning  . Omega-3 Fatty Acids (FISH OIL) 1000 MG CAPS Take 2,000 mg by mouth daily.  . ondansetron (ZOFRAN) 4 MG tablet Take 4 mg by mouth every 6 (six) hours as needed for nausea or vomiting.   [name] OVER THE COUNTER MEDICATION Take 1 tablet by mouth daily as needed (GI upset). Digest Gold  . polyethylene glycol (MIRALAX / GLYCOLAX) packet Take 17 g by mouth daily. Mix in water or beverage of choice  . potassium chloride SA (KLOR-CON) 20 MEQ tablet Take 1 tablet (20 mEq total) by mouth daily. Take one tablet (20 meq) by mouth every evening for 3 days - 05/21/2020 thru 05/23/2020  . Probiotic Product (PROBIOTIC PO) Take 1 capsule by mouth daily.  [name] senna-docusate (SENNA S) 8.6-50 MG tablet Take 1 tablet by mouth See admin instructions. Take 1 tablet by mouth daily and hold for loose stools  . Ubiquinol 100 MG CAPS Take 100 mg by mouth daily.  . vitamin B-12 (CYANOCOBALAMIN) 500 MCG tablet Take 500 mcg by mouth daily.  . vitamin C (ASCORBIC ACID) 250 MG tablet Take 750 mg by mouth daily.   No facility-administered encounter medications on file as of 06/23/2020.    Review of Systems  Constitutional: Positive for activity change. Negative for appetite change, chills, diaphoresis, fatigue, fever and unexpected weight change.  HENT: Negative for congestion.   Respiratory: Negative for cough, shortness of breath and wheezing.   Cardiovascular: Positive for leg swelling. Negative for chest pain and palpitations.  Gastrointestinal: Negative for abdominal distention, abdominal pain, constipation and diarrhea.  Genitourinary: Negative for difficulty urinating and dysuria.  Musculoskeletal: Positive for gait problem. Negative for arthralgias, back pain, joint swelling and myalgias.  Skin: Positive for wound. Negative for color change, pallor and rash.  Neurological: Negative for  dizziness, tremors, seizures, syncope, facial asymmetry, speech difficulty, weakness, light-headedness, numbness and headaches.       Previously was dizzy but not for today's visit. Has lack of coordination and poor balance   Psychiatric/Behavioral: Positive for behavioral problems, confusion and sleep disturbance. Negative for agitation. The  patient is nervous/anxious.     Immunization History  Administered Date(s) Administered  . Influenza Split 04/28/2012  . Moderna Sars-Covid-2 Vaccination 07/19/2019, 08/18/2019  . Zoster Recombinat (Shingrix) 03/31/2019   Pertinent  Health Maintenance Due  Topic Date Due  . DEXA SCAN  Never done  . INFLUENZA VACCINE  Completed  . PNA vac Low Risk Adult  Completed   Fall Risk  03/15/2016  Falls in the past year? Yes  Comment Emmi Telephone Survey: data to providers prior to load  Number falls in past yr: 1  Comment Emmi Telephone Survey Actual Response = 1  Injury with Fall? No   Functional Status Survey:    Vitals:   06/23/20 1642  BP: 121/71  Pulse: 80  Resp: 14  Temp: 97.7 F (36.5 C)  SpO2: 94%  Weight: 114 lb (51.7 kg)   Body mass index is 20.85 kg/m. Physical Exam Vitals and nursing note reviewed.  Constitutional:      General: She is not in acute distress.    Appearance: She is not diaphoretic.  HENT:     Head: Normocephalic and atraumatic.  Eyes:     Conjunctiva/sclera: Conjunctivae normal.     Pupils: Pupils are equal, round, and reactive to light.  Neck:     Vascular: No JVD.  Cardiovascular:     Rate and Rhythm: Normal rate. Rhythm irregular.     Heart sounds: No murmur heard.   Pulmonary:     Effort: Pulmonary effort is normal. No respiratory distress.     Breath sounds: Normal breath sounds. No wheezing.  Abdominal:     General: Bowel sounds are normal. There is no distension.     Palpations: Abdomen is soft.     Tenderness: There is no abdominal tenderness.  Musculoskeletal:     Right lower leg: Edema  present.     Left lower leg: Edema present.  Skin:    General: Skin is warm and dry.     Findings: No erythema or rash.     Comments: Dry skin with excoriation to the back and BLE. Skin abrasion covered by dressing to the RLE  Neurological:     Mental Status: She is alert. She is disoriented.     Motor: Weakness present.     Coordination: Coordination abnormal.     Gait: Gait abnormal.     Comments: Oriented to self and place but confused about the details of her care. Poor balance and lack of coordination in the left hand   Psychiatric:        Mood and Affect: Mood normal.     Labs reviewed: Recent Labs    05/22/20 1408 05/23/20 0225 05/24/20 0444 05/25/20 0248 05/27/20 1440  NA 130*  --  132* 131* 133*  K 3.6  --  3.6 3.4* 3.5  CL 95*  --  90* 91* 88*  CO2 23  --  25 26 31   GLUCOSE 105*  --  107* 117* 148*  BUN 17  --  14 16 19   CREATININE 0.77  --  0.82 0.82 0.89  CALCIUM 8.1*  --  9.0 8.9 9.2  MG 1.7 1.7  --   --   --   PHOS  --  3.3  --   --   --    Recent Labs    05/23/20 0404 05/24/20 0444 05/25/20 0248  AST 32 33 33  ALT 22 22 24   ALKPHOS 72 79 80  BILITOT 2.1* 2.2* 2.4*  PROT 6.5 6.7 6.3*  ALBUMIN 3.5 3.5 3.4*   Recent Labs    05/24/20 0444 05/25/20 0248 05/27/20 1440  WBC 6.8 7.6 7.9  NEUTROABS 4.9 5.6  --   HGB 12.5 12.2 13.9  HCT 37.2 36.5 42.1  MCV 104.2* 104.6* 110.5*  PLT 76* 75* 81*   Lab Results  Component Value Date   TSH 3.040 01/28/2018   Lab Results  Component Value Date   HGBA1C  12/14/2008    5.2 (NOTE) The ADA recommends the following therapeutic goal for glycemic control related to Hgb A1c measurement: Goal of therapy: <6.5 Hgb A1c  Reference: American Diabetes Association: Clinical Practice Recommendations 2010, Diabetes Care, 2010, 33: (Suppl  1).   No results found for: CHOL, HDL, LDLCALC, LDLDIRECT, TRIG, CHOLHDL  Significant Diagnostic Results in last 30 days:  CT Head Wo Contrast  Result Date:  05/27/2020 CLINICAL DATA:  Left hand weakness EXAM: CT HEAD WITHOUT CONTRAST TECHNIQUE: Contiguous axial images were obtained from the base of the skull through the vertex without intravenous contrast. COMPARISON:  None. FINDINGS: Brain: No evidence of acute territorial infarction, hemorrhage, hydrocephalus,extra-axial collection or mass lesion/mass effect. There is dilatation the ventricles and sulci consistent with age-related atrophy. Low-attenuation changes in the deep white matter consistent with small vessel ischemia. Vascular: No hyperdense vessel or unexpected calcification. Skull: The skull is intact. No fracture or focal lesion identified. Sinuses/Orbits: The visualized paranasal sinuses and mastoid air cells are clear. The orbits and globes intact. Other: None IMPRESSION: No acute intracranial abnormality. Findings consistent with age related atrophy and chronic small vessel ischemia Electronically Signed   By: Prudencio Pair M.D.   On: 05/27/2020 17:06   MR BRAIN W WO CONTRAST  Addendum Date: 06/23/2020   ADDENDUM REPORT: 06/23/2020 12:33 ADDENDUM: Abnormal appearance of the distal vertebral arteries which could reflect atherosclerosis and reduced flow versus segmental occlusion. Electronically Signed   By: Logan Bores M.D.   On: 06/23/2020 12:33   Result Date: 06/23/2020 CLINICAL DATA:  Cerebellar dysfunction. Delirium. Left-sided weakness. Symptoms for nearly 1 month. EXAM: MRI HEAD WITHOUT AND WITH CONTRAST TECHNIQUE: Multiplanar, multiecho pulse sequences of the brain and surrounding structures were obtained without and with intravenous contrast. CONTRAST:  6mL MULTIHANCE GADOBENATE DIMEGLUMINE 529 MG/ML IV SOLN COMPARISON:  Head CT 05/27/2020 FINDINGS: Brain: There are small cortical and subcortical infarcts in the right parietal and posterior right frontal lobes which are subacute in appearance with mild gyral enhancement and with greatest involvement of the white matter of the centrum  semiovale. Subcentimeter subacute infarcts are noted in the left corona radiata. There are additional subcentimeter foci of cortical enhancement posteriorly in the left greater than right parietal lobes and in the posterior left frontal lobe also most consistent with subacute infarcts. Mild non-masslike enhancement as well as intrinsic T1 hyperintensity and susceptibility artifact in the right basal ganglia are also consistent with subacute infarcts with a small amount of associated subacute blood products. There is also a 1 cm acute to early subacute right cerebellar infarct. T2 hyperintensities elsewhere in the cerebral white matter bilaterally are nonspecific but compatible with mild chronic small vessel ischemic disease. A few scattered chronic cerebral microhemorrhages are noted. No mass, midline shift, or extra-axial fluid collection is identified. Generalized cerebral atrophy is mild for age. Vascular: Partial loss of a normal flow void in the distal vertebral arteries. Skull and upper cervical spine: Unremarkable bone marrow signal. Sinuses/Orbits: Bilateral cataract extraction. Paranasal sinuses and mastoid air cells are clear. Other:  None. IMPRESSION: 1. Small subacute infarcts scattered in the right greater than left cerebral hemispheres consistent with emboli. 2. Small acute to subacute right cerebellar infarct. 3. Mild chronic small vessel ischemic disease. These results will be called to the ordering clinician or representative by the Radiologist Assistant, and communication documented in the PACS or [employer]. Electronically Signed: By: Logan Bores M.D. On: 06/23/2020 10:02   CARDIAC EVENT MONITOR  Result Date: 06/18/2020  AStrial fibrillation, continuous with controlled rate.    DG Chest Port 1 View  Result Date: 05/27/2020 CLINICAL DATA:  Fall EXAM: PORTABLE CHEST 1 VIEW COMPARISON:  May 22, 2020 FINDINGS: The heart size and mediastinal contours are mildly enlarged. Aortic  knob calcifications are seen. No large airspace consolidation or pleural effusion. Healed rib fractures are seen in the right lower chest. IMPRESSION: No active disease. Electronically Signed   By: Prudencio Pair M.D.   On: 05/27/2020 18:01    Assessment/Plan 1. Insomnia due to medical condition Not relieved with non pharmacologic measures and melatonin Add Vistaril 25 mg qhs prn. If no improvement in the next few days notify [facility] for additional recommendations.   2. Pruritus Unclear etiology but was present prior to MRI Begin vistaril as above Add triamcinolone 0.1% bid x 7 days Avoid dairy and drink the almond milk provided by her son   3. Cerebellar infarct [facility]) embolic On MRI 06/26/74  Has scattered emboli as well in both hemispheres despite preventative treatments.  Continues on Eliquis for CVA risk reduction Continue PT F/U with Dr. Delice Lesch on 12/20.      Family/ staff Communication: discussed with her son Moreen Fowler ordered: NA

## 2020-06-23 NOTE — Telephone Encounter (Signed)
[facility] Radiology called with MRI results for the patient.   "IMPRESSION: 1. Small subacute infarcts scattered in the right greater than left cerebral hemispheres consistent with emboli. 2. Small acute to subacute right cerebellar infarct. 3. Mild chronic small vessel ischemic disease."

## 2020-06-23 NOTE — Telephone Encounter (Signed)
Thank you for forwarding the MRI results. I will see her and call her son.

## 2020-06-24 DIAGNOSIS — R296 Repeated falls: Secondary | ICD-10-CM | POA: Diagnosis not present

## 2020-06-24 DIAGNOSIS — R41841 Cognitive communication deficit: Secondary | ICD-10-CM | POA: Diagnosis not present

## 2020-06-24 DIAGNOSIS — R2689 Other abnormalities of gait and mobility: Secondary | ICD-10-CM | POA: Diagnosis not present

## 2020-06-24 DIAGNOSIS — M6389 Disorders of muscle in diseases classified elsewhere, multiple sites: Secondary | ICD-10-CM | POA: Diagnosis not present

## 2020-06-24 DIAGNOSIS — R278 Other lack of coordination: Secondary | ICD-10-CM | POA: Diagnosis not present

## 2020-06-24 DIAGNOSIS — I5033 Acute on chronic diastolic (congestive) heart failure: Secondary | ICD-10-CM | POA: Diagnosis not present

## 2020-06-27 ENCOUNTER — Ambulatory Visit (INDEPENDENT_AMBULATORY_CARE_PROVIDER_SITE_OTHER): Payer: Medicare Other | Admitting: Neurology

## 2020-06-27 ENCOUNTER — Other Ambulatory Visit: Payer: Self-pay

## 2020-06-27 ENCOUNTER — Encounter: Payer: Self-pay | Admitting: Neurology

## 2020-06-27 VITALS — BP 109/65 | HR 92 | Ht 62.0 in | Wt 115.0 lb

## 2020-06-27 DIAGNOSIS — R41841 Cognitive communication deficit: Secondary | ICD-10-CM | POA: Diagnosis not present

## 2020-06-27 DIAGNOSIS — M6389 Disorders of muscle in diseases classified elsewhere, multiple sites: Secondary | ICD-10-CM | POA: Diagnosis not present

## 2020-06-27 DIAGNOSIS — I639 Cerebral infarction, unspecified: Secondary | ICD-10-CM | POA: Diagnosis not present

## 2020-06-27 DIAGNOSIS — R2689 Other abnormalities of gait and mobility: Secondary | ICD-10-CM | POA: Diagnosis not present

## 2020-06-27 DIAGNOSIS — I5033 Acute on chronic diastolic (congestive) heart failure: Secondary | ICD-10-CM | POA: Diagnosis not present

## 2020-06-27 DIAGNOSIS — R278 Other lack of coordination: Secondary | ICD-10-CM | POA: Diagnosis not present

## 2020-06-27 DIAGNOSIS — R296 Repeated falls: Secondary | ICD-10-CM | POA: Diagnosis not present

## 2020-06-27 DIAGNOSIS — F0391 Unspecified dementia with behavioral disturbance: Secondary | ICD-10-CM

## 2020-06-27 MED ORDER — MIRTAZAPINE 7.5 MG PO TABS: 7.5000 mg | ORAL_TABLET | Freq: Every day | ORAL | 11 refills | Status: DC
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
## 2020-06-27 NOTE — Patient Instructions (Signed)
1. Start mirtazapine 7.5mg  every night. If no improvement in symptoms and no side effects, can increase to 15mg  every night  2. Continue working on sleep hygiene, keep active during day. Increased supervision at night  3. Follow-up in 6 months, call for any changes

## 2020-06-28 DIAGNOSIS — M6389 Disorders of muscle in diseases classified elsewhere, multiple sites: Secondary | ICD-10-CM | POA: Diagnosis not present

## 2020-06-28 DIAGNOSIS — R41841 Cognitive communication deficit: Secondary | ICD-10-CM | POA: Diagnosis not present

## 2020-06-28 DIAGNOSIS — R2689 Other abnormalities of gait and mobility: Secondary | ICD-10-CM | POA: Diagnosis not present

## 2020-06-28 DIAGNOSIS — R278 Other lack of coordination: Secondary | ICD-10-CM | POA: Diagnosis not present

## 2020-06-28 DIAGNOSIS — R296 Repeated falls: Secondary | ICD-10-CM | POA: Diagnosis not present

## 2020-06-28 DIAGNOSIS — I5033 Acute on chronic diastolic (congestive) heart failure: Secondary | ICD-10-CM | POA: Diagnosis not present

## 2020-06-29 DIAGNOSIS — M6389 Disorders of muscle in diseases classified elsewhere, multiple sites: Secondary | ICD-10-CM | POA: Diagnosis not present

## 2020-06-29 DIAGNOSIS — R296 Repeated falls: Secondary | ICD-10-CM | POA: Diagnosis not present

## 2020-06-29 DIAGNOSIS — R41841 Cognitive communication deficit: Secondary | ICD-10-CM | POA: Diagnosis not present

## 2020-06-29 DIAGNOSIS — R278 Other lack of coordination: Secondary | ICD-10-CM | POA: Diagnosis not present

## 2020-06-29 DIAGNOSIS — R2689 Other abnormalities of gait and mobility: Secondary | ICD-10-CM | POA: Diagnosis not present

## 2020-06-29 DIAGNOSIS — I5033 Acute on chronic diastolic (congestive) heart failure: Secondary | ICD-10-CM | POA: Diagnosis not present

## 2020-06-29 NOTE — Progress Notes (Signed)
Nothing more to add.

## 2020-06-29 NOTE — Progress Notes (Signed)
Nothing to add

## 2020-06-30 DIAGNOSIS — R278 Other lack of coordination: Secondary | ICD-10-CM | POA: Diagnosis not present

## 2020-06-30 DIAGNOSIS — I5033 Acute on chronic diastolic (congestive) heart failure: Secondary | ICD-10-CM | POA: Diagnosis not present

## 2020-06-30 DIAGNOSIS — R2689 Other abnormalities of gait and mobility: Secondary | ICD-10-CM | POA: Diagnosis not present

## 2020-06-30 DIAGNOSIS — M6389 Disorders of muscle in diseases classified elsewhere, multiple sites: Secondary | ICD-10-CM | POA: Diagnosis not present

## 2020-06-30 DIAGNOSIS — R296 Repeated falls: Secondary | ICD-10-CM | POA: Diagnosis not present

## 2020-06-30 DIAGNOSIS — R41841 Cognitive communication deficit: Secondary | ICD-10-CM | POA: Diagnosis not present

## 2020-07-04 DIAGNOSIS — R41841 Cognitive communication deficit: Secondary | ICD-10-CM | POA: Diagnosis not present

## 2020-07-04 DIAGNOSIS — R296 Repeated falls: Secondary | ICD-10-CM | POA: Diagnosis not present

## 2020-07-04 DIAGNOSIS — M6389 Disorders of muscle in diseases classified elsewhere, multiple sites: Secondary | ICD-10-CM | POA: Diagnosis not present

## 2020-07-04 DIAGNOSIS — R2689 Other abnormalities of gait and mobility: Secondary | ICD-10-CM | POA: Diagnosis not present

## 2020-07-04 DIAGNOSIS — I5033 Acute on chronic diastolic (congestive) heart failure: Secondary | ICD-10-CM | POA: Diagnosis not present

## 2020-07-04 DIAGNOSIS — R278 Other lack of coordination: Secondary | ICD-10-CM | POA: Diagnosis not present

## 2020-07-05 DIAGNOSIS — I5033 Acute on chronic diastolic (congestive) heart failure: Secondary | ICD-10-CM | POA: Diagnosis not present

## 2020-07-05 DIAGNOSIS — R278 Other lack of coordination: Secondary | ICD-10-CM | POA: Diagnosis not present

## 2020-07-05 DIAGNOSIS — R296 Repeated falls: Secondary | ICD-10-CM | POA: Diagnosis not present

## 2020-07-05 DIAGNOSIS — R2689 Other abnormalities of gait and mobility: Secondary | ICD-10-CM | POA: Diagnosis not present

## 2020-07-05 DIAGNOSIS — R41841 Cognitive communication deficit: Secondary | ICD-10-CM | POA: Diagnosis not present

## 2020-07-05 DIAGNOSIS — M6389 Disorders of muscle in diseases classified elsewhere, multiple sites: Secondary | ICD-10-CM | POA: Diagnosis not present

## 2020-07-06 DIAGNOSIS — R2689 Other abnormalities of gait and mobility: Secondary | ICD-10-CM | POA: Diagnosis not present

## 2020-07-06 DIAGNOSIS — R41841 Cognitive communication deficit: Secondary | ICD-10-CM | POA: Diagnosis not present

## 2020-07-06 DIAGNOSIS — R296 Repeated falls: Secondary | ICD-10-CM | POA: Diagnosis not present

## 2020-07-06 DIAGNOSIS — R278 Other lack of coordination: Secondary | ICD-10-CM | POA: Diagnosis not present

## 2020-07-06 DIAGNOSIS — I5033 Acute on chronic diastolic (congestive) heart failure: Secondary | ICD-10-CM | POA: Diagnosis not present

## 2020-07-06 DIAGNOSIS — M6389 Disorders of muscle in diseases classified elsewhere, multiple sites: Secondary | ICD-10-CM | POA: Diagnosis not present

## 2020-07-07 DIAGNOSIS — M6389 Disorders of muscle in diseases classified elsewhere, multiple sites: Secondary | ICD-10-CM | POA: Diagnosis not present

## 2020-07-07 DIAGNOSIS — R41841 Cognitive communication deficit: Secondary | ICD-10-CM | POA: Diagnosis not present

## 2020-07-07 DIAGNOSIS — R2689 Other abnormalities of gait and mobility: Secondary | ICD-10-CM | POA: Diagnosis not present

## 2020-07-07 DIAGNOSIS — R296 Repeated falls: Secondary | ICD-10-CM | POA: Diagnosis not present

## 2020-07-07 DIAGNOSIS — R278 Other lack of coordination: Secondary | ICD-10-CM | POA: Diagnosis not present

## 2020-07-07 DIAGNOSIS — I5033 Acute on chronic diastolic (congestive) heart failure: Secondary | ICD-10-CM | POA: Diagnosis not present

## 2020-07-11 ENCOUNTER — Non-Acute Institutional Stay (SKILLED_NURSING_FACILITY): Payer: Medicare Other | Admitting: Adult Health

## 2020-07-11 DIAGNOSIS — F5101 Primary insomnia: Secondary | ICD-10-CM | POA: Diagnosis not present

## 2020-07-11 DIAGNOSIS — I639 Cerebral infarction, unspecified: Secondary | ICD-10-CM | POA: Diagnosis not present

## 2020-07-11 DIAGNOSIS — R112 Nausea with vomiting, unspecified: Secondary | ICD-10-CM

## 2020-07-11 DIAGNOSIS — G3184 Mild cognitive impairment, so stated: Secondary | ICD-10-CM

## 2020-07-12 ENCOUNTER — Encounter: Payer: Self-pay | Admitting: Adult Health

## 2020-07-12 ENCOUNTER — Non-Acute Institutional Stay (SKILLED_NURSING_FACILITY): Payer: Medicare Other | Admitting: Internal Medicine

## 2020-07-12 DIAGNOSIS — E875 Hyperkalemia: Secondary | ICD-10-CM

## 2020-07-12 DIAGNOSIS — Z9189 Other specified personal risk factors, not elsewhere classified: Secondary | ICD-10-CM | POA: Diagnosis not present

## 2020-07-12 DIAGNOSIS — R112 Nausea with vomiting, unspecified: Secondary | ICD-10-CM | POA: Diagnosis not present

## 2020-07-12 DIAGNOSIS — I639 Cerebral infarction, unspecified: Secondary | ICD-10-CM

## 2020-07-12 DIAGNOSIS — G43A0 Cyclical vomiting, not intractable: Secondary | ICD-10-CM | POA: Diagnosis not present

## 2020-07-12 DIAGNOSIS — R410 Disorientation, unspecified: Secondary | ICD-10-CM

## 2020-07-12 DIAGNOSIS — D696 Thrombocytopenia, unspecified: Secondary | ICD-10-CM

## 2020-07-12 DIAGNOSIS — R296 Repeated falls: Secondary | ICD-10-CM

## 2020-07-12 DIAGNOSIS — Z20828 Contact with and (suspected) exposure to other viral communicable diseases: Secondary | ICD-10-CM | POA: Diagnosis not present

## 2020-07-12 DIAGNOSIS — E782 Mixed hyperlipidemia: Secondary | ICD-10-CM | POA: Diagnosis not present

## 2020-07-12 DIAGNOSIS — R531 Weakness: Secondary | ICD-10-CM | POA: Diagnosis not present

## 2020-07-12 LAB — CBC AND DIFFERENTIAL
HCT: 34 — AB (ref 36–46)
Hemoglobin: 12 (ref 12.0–16.0)
Platelets: 7 — AB (ref 150–399)
WBC: 9

## 2020-07-12 LAB — BASIC METABOLIC PANEL
BUN: 67 — AB (ref 4–21)
CO2: 18 (ref 13–22)
Chloride: 99 (ref 99–108)
Creatinine: 1.4 — AB (ref 0.5–1.1)
Glucose: 130
Potassium: 6.1 — AB (ref 3.4–5.3)
Sodium: 135 — AB (ref 137–147)

## 2020-07-12 LAB — COMPREHENSIVE METABOLIC PANEL: Calcium: 9 (ref 8.7–10.7)

## 2020-07-12 NOTE — Progress Notes (Signed)
Location:  Medical illustrator of Service:  SNF (31) Provider:  Dashawna Delbridge L. Renato Gails, D.O., C.M.D.  Kermit Balo, DO  Patient Care Team: Kermit Balo, DO as PCP - General (Geriatric Medicine) Lyn Records, MD as PCP - Cardiology (Cardiology) Regan Lemming, MD as PCP - Electrophysiology (Cardiology) Van Clines, MD as Consulting Physician (Neurology)  Extended Emergency Contact Information Primary Emergency Contact: Elisabeth Cara of [place] Home Phone: [phone] Mobile Phone: [phone] Relation: Son Secondary Emergency Contact: Dimas Millin Address: [address]          [place], [place] Home Phone: [phone] Mobile Phone: [phone] Relation: Son  Code Status:  DNR Goals of care: Advanced Directive information Advanced Directives 06/27/2020  Does Patient Have a Medical Advance Directive? Yes  Type of Estate agent of [place];Living will;Out of facility DNR (pink MOST or yellow form)  Does patient want to make changes to medical advance directive? -  Copy of Healthcare Power of Attorney in Chart? -  Would patient like information on creating a medical advance directive? -  Pre-existing out of facility DNR order (yellow form or pink MOST form) -     Chief Complaint  Patient presents with  . Acute Visit    Generalized malaise, abnormal behavior, nausea and vomiting, rapid covid negative    HPI:  Pt is a 87 y.o. female seen today for an acute visit for generalized malaise, abnormal behavior and nausea, vomiting. Rapid covid negative.  PCR still pending.  Hepatic function pending.  Cbc and bmp had revealed hyperkalemia and lokelma was ordered to treat this, potassium held, with recheck and platelets were incredibly low at just 7 so eliquis is on hold per my orders.  She has had a fall this am when walking to the bathroom.  She has a thought that she was told not to use  her walker by someone and didn't want to wait for assistance and use to the wheelchair so got up on her own and hit the left side of her head and her forearm which remains tender and swollen, but she has full ROM.  Hepatic functions were ordered by NP but still pending.    Ms. Kubilus remains quite confused and difficulty finding words (this all seems worse).  Her intake has been incredibly poor.  When I was there at her evening meal, the tray was brought in and set up.  She told me she'd eat if her meat was cut up so I did this for her.  I encouraged her to sit up and try some of her meal.  She had one piece of meat, chewed it and spit it in a napkin.  She tried one apple which I fed her and didn't like it.  Says she's tired of apples.  She did drink the 1/4 cup of soup she had but left all of the vegetable contents or spit them back out.  She denies trouble swallowing though.  She did not want the pie (does not like sweet things).  She denies overt nausea now. She feels weak and fatigued and just wants to rest.  We talk about hospitalizations and she says "do whatever's best" but has previously told me when her head was clearer that she does not ever want to go back to the hospital.    I attempted to reach her son on two occasions but could not reach him.  Turned out he'd been  present at [place] but I was not made aware.  Past Medical History:  Diagnosis Date  . Anxiety   . Arthritis    Some in neck and back, but "can't complain"  . Atypical atrial flutter ([place])   . Breast cancer ([place])   . Cancer [facility])    1998 Right breast cancer with lumpectomy and radiation therapy  . Chronic diastolic CHF (congestive heart failure) ([place])   . Complication of anesthesia    Nausea from versed  . GERD (gastroesophageal reflux disease)   . Glaucoma   . Hiatal hernia   . History of mitral valve repair    Mitral valve prolapse, surgery in 2010  . Hyponatremia   . Normocytic anemia 03/21/2014  . Persistent  atrial fibrillation ([place])    s/p MAZE 2010  . Personal history of radiation therapy   . Rheumatic fever    age 69  . Thrombocytopenia ([place])    Past Surgical History:  Procedure Laterality Date  . ABDOMINAL HYSTERECTOMY    . APPENDECTOMY    . BACK SURGERY     Synovial cyst on spine, removed laproscopically in 2000?  [name] BREAST BIOPSY    . BREAST LUMPECTOMY     right 1998  . BREAST SURGERY     1998 Lumpectomy in right breast  . CARDIAC CATHETERIZATION    . CARDIOVERSION  04/29/2012   Procedure: CARDIOVERSION;  Surgeon: [name], MD;  Location: [facility] ENDOSCOPY;  Service: Cardiovascular;  Laterality: N/A;  . CARDIOVERSION  06/19/2012   Procedure: CARDIOVERSION;  Surgeon: Sinclair Grooms, MD;  Location: [facility] ENDOSCOPY;  Service: Cardiovascular;  Laterality: N/A;  h/p from 11/8 in file drawer/dl  . CATARACT EXTRACTION    . CESAREAN SECTION     Three C-Sections  . EYE SURGERY     Cataract Surgery, both eyes  . KIDNEY CYST REMOVAL    . MITRAL VALVE REPAIR  June 2010  . TEE WITHOUT CARDIOVERSION  04/29/2012   Procedure: TRANSESOPHAGEAL ECHOCARDIOGRAM (TEE);  Surgeon: [name], MD;  Location: [facility] ENDOSCOPY;  Service: Cardiovascular;  Laterality: N/A;  . TONSILLECTOMY      Allergies  Allergen Reactions  . Amiodarone Other (See Comments)    "Sick"- "allergic," per MAR  . Amoxicillin Nausea And Vomiting  . Amoxicillin-Pot Clavulanate Nausea And Vomiting  . Aspirin Other (See Comments)    Unknown... 325 mg and more.  Elsie Amis Other (See Comments)    "Allergic," per MAR  . Ciprofloxacin Other (See Comments)    Flu symptoms and "Allergic," per MAR  . Codeine Nausea And Vomiting and Other (See Comments)    "Allergic," per MAR  . Dairycare [Lactase-Lactobacillus] Other (See Comments)    "Allergic," per MAR  . Diuretic [Buchu-Cornsilk-Ch Grass-Hydran] Other (See Comments)    Per Pharmacy record  . Fioricet-Codeine [Butalbital-Apap-Caff-Cod] Nausea And Vomiting and Other (See  Comments)    "Allergic," per MAR  . Guaifenesin & Derivatives Other (See Comments)    "Allergic," per MAR  . Iohexol Hives     Code: HIVES, Desc: **11/22/08 **NO REACTION W/ OMNI 300// PT S/P 3 CT SCANS W/ IV CM AND NO HIVES/MMS**PT STATED HIVES 16 YRS AGO. POSSIBLE IV DYE W/ CT/XRAY. MEDICATED W/ 50MG  OF BENEDRYL PO PRIOR TO SCAN, Onset Date: [id number]   . Latanoprost     Other reaction(s): redness  . Lisinopril Other (See Comments) and Cough    "Allergic," per MAR  . Midazolam Hcl Nausea And Vomiting  . Milk-Related  Compounds Other (See Comments)    "Allergic," per MAR  . Quinolones Other (See Comments)    Flu like symptoms  . Sotalol Other (See Comments)    "Allergic," per MAR  . Sulfa Antibiotics Other (See Comments)    Flu-like symptoms 'serum sickness' and "Allergic," per MAR  . Thioxanthenes Other (See Comments)    Flu like symptoms 'serum sickness'  . Tyramine     Other reaction(s): headache  . Valium Nausea And Vomiting and Other (See Comments)    "Allergic," per MAR  . Vancomycin Other (See Comments)    "Allergic," per MAR  . Versed [Midazolam] Nausea And Vomiting and Other (See Comments)    "Allergic," per MAR  . Vinegar [Acetic Acid] Other (See Comments)    "Allergic," per [facility]    Outpatient Encounter Medications as of 07/12/2020  Medication Sig  . hydrOXYzine (VISTARIL) 25 MG capsule Take 25 mg by mouth daily as needed. May repeat x 1 each night if not effective  . ondansetron (ZOFRAN) 4 MG tablet Take 4 mg by mouth every 6 (six) hours as needed for nausea or vomiting.   . polyethylene glycol (MIRALAX / GLYCOLAX) packet Take 17 g by mouth daily. Mix in water or beverage of choice  . senna-docusate (SENOKOT-S) 8.6-50 MG tablet Take 1 tablet by mouth See admin instructions. Take 1 tablet by mouth daily and hold for loose stools  . traZODone (DESYREL) 50 MG tablet Take 25 mg by mouth at bedtime. Take 25 mg qhs for 3 days then increase to 50 mg qhs  . [DISCONTINUED]  acetaminophen (TYLENOL) 500 MG tablet Take 500 mg by mouth 3 (three) times daily as needed for mild pain, fever or headache.   . [DISCONTINUED] apixaban (ELIQUIS) 2.5 MG TABS tablet Take 2.5 mg by mouth 2 (two) times daily.   . [DISCONTINUED] estradiol (ESTRACE) 0.1 MG/GM vaginal cream Place 1 g vaginally See admin instructions. Apply 1 gram vaginally on Tuesday and Friday nights  . [DISCONTINUED] furosemide (LASIX) 40 MG tablet Take 1 tablet (40 mg total) by mouth daily. Take one tablet (40 mg) by mouth every evening for 3 days - 05/21/2020 thru 05/23/2020  . [DISCONTINUED] metoprolol tartrate (LOPRESSOR) 25 MG tablet Take 25 mg by mouth daily. Take 25 mg PO at bedtime for CHF  . [DISCONTINUED] metoprolol tartrate (LOPRESSOR) 50 MG tablet Take 50 mg by mouth daily. Special Instruction: For CHF Once a morning; 8:00am- 9:00 am  . [DISCONTINUED] NON FORMULARY Take 2 capsules by mouth See admin instructions. Arthri-D3 dietary supplement (glucosamine with key plant extracts)- Take 2 capsules by mouth in the morning  . [DISCONTINUED] potassium chloride SA (KLOR-CON) 20 MEQ tablet Take 1 tablet (20 mEq total) by mouth daily. Take one tablet (20 meq) by mouth every evening for 3 days - 05/21/2020 thru 05/23/2020 (Patient taking differently: Take 20 mEq by mouth daily.)  . [DISCONTINUED] Ubiquinol 100 MG CAPS Take 100 mg by mouth daily.  . [DISCONTINUED] vitamin B-12 (CYANOCOBALAMIN) 500 MCG tablet Take 500 mcg by mouth daily.   No facility-administered encounter medications on file as of 07/12/2020.    Review of Systems  Constitutional: Positive for malaise/fatigue and weight loss. Negative for chills and fever.  HENT: Negative for congestion and sore throat.   Eyes: Negative for blurred vision.       Glasses  Respiratory: Negative for cough and shortness of breath.   Cardiovascular: Positive for leg swelling. Negative for chest pain and palpitations.  Gastrointestinal: Positive for nausea  and vomiting.  Negative for abdominal pain, blood in stool, constipation, diarrhea, heartburn and melena.  Genitourinary: Negative for dysuria.  Musculoskeletal: Positive for falls.       Left forearm pain  Skin: Negative for rash.  Neurological: Positive for weakness. Negative for loss of consciousness and headaches.  Endo/Heme/Allergies: Bruises/bleeds easily.  Psychiatric/Behavioral: Positive for depression and memory loss. The patient is nervous/anxious.     Immunization History  Administered Date(s) Administered  . Influenza Split 05/01/2010, 05/03/2011, 04/28/2012, 06/17/2013  . Influenza, High Dose Seasonal PF 05/27/2017, 03/19/2018, 03/18/2019, 04/01/2020, 04/29/2020  . Moderna Sars-Covid-2 Vaccination 07/19/2019, 08/18/2019  . Pneumococcal Conjugate-13 12/31/2013  . Pneumococcal Polysaccharide-23 07/09/1996, 03/06/2016  . Td 07/10/1999  . Zoster 07/09/2005, 03/31/2019  . Zoster Recombinat (Shingrix) 03/31/2019   Pertinent  Health Maintenance Due  Topic Date Due  . DEXA SCAN  Never done  . INFLUENZA VACCINE  Completed  . PNA vac Low Risk Adult  Completed   Fall Risk  06/27/2020 03/15/2016  Falls in the past year? 1 Yes  Comment - Emmi Telephone Survey: data to providers prior to load  Number falls in past yr: 1 1  Comment - Emmi Telephone Survey Actual Response = 1  Injury with Fall? 0 No  Risk for fall due to : Impaired balance/gait;Impaired mobility -   Functional Status Survey:    Vitals:   07/12/20 1429  BP: (!) 130/58  Pulse: 80  Resp: 16  Temp: 98.7 F (37.1 C)  SpO2: 98%  Weight: 113 lb (51.3 kg)  Height: 5\' 2"  (1.575 m)   Body mass index is 20.67 kg/m. Physical Exam Vitals and nursing note reviewed.  Constitutional:      Appearance: She is ill-appearing.     Comments: Frail female resting in bed on her side, did not get up even to speak with me until I suggested she try some of her dinner  HENT:     Head:     Comments: Ecchymoses left face/cheek area Eyes:      Conjunctiva/sclera: Conjunctivae normal.  Cardiovascular:     Rate and Rhythm: Rhythm irregular.     Heart sounds: No murmur heard.   Abdominal:     General: Bowel sounds are normal. There is no distension.     Palpations: Abdomen is soft.     Tenderness: There is no abdominal tenderness. There is no guarding or rebound.  Musculoskeletal:     Right lower leg: Edema present.     Left lower leg: Edema present.  Skin:    Findings: Bruising present.     Comments: Bruising on various parts of legs, feet, also left temple/cheek and forearm  Neurological:     Motor: Weakness present.     Gait: Gait abnormal.     Comments: Has delusion that she was told she's not allowed to use her walker  Psychiatric:     Comments: Depressed mood, wants to be left alone     Labs reviewed: Recent Labs    05/22/20 1408 05/23/20 0225 05/24/20 0444 05/25/20 0248 05/27/20 1440 07/12/20 0000  NA 130*  --  132* 131* 133* 135*  K 3.6  --  3.6 3.4* 3.5 6.1*  CL 95*  --  90* 91* 88* 99  CO2 23  --  25 26 31 18   GLUCOSE 105*  --  107* 117* 148*  --   BUN 17  --  14 16 19  67*  CREATININE 0.77  --  0.82 0.82 0.89 1.4*  CALCIUM 8.1*  --  9.0 8.9 9.2 9.0  MG 1.7 1.7  --   --   --   --   PHOS  --  3.3  --   --   --   --    Recent Labs    05/23/20 0404 05/24/20 0444 05/25/20 0248  AST 32 33 33  ALT 22 22 24   ALKPHOS 72 79 80  BILITOT 2.1* 2.2* 2.4*  PROT 6.5 6.7 6.3*  ALBUMIN 3.5 3.5 3.4*   Recent Labs    05/24/20 0444 05/25/20 0248 05/27/20 1440 07/12/20 0000  WBC 6.8 7.6 7.9 9.0  NEUTROABS 4.9 5.6  --   --   HGB 12.5 12.2 13.9 12.0  HCT 37.2 36.5 42.1 34*  MCV 104.2* 104.6* 110.5*  --   PLT 76* 75* 81* 7*   Lab Results  Component Value Date   TSH 3.040 01/28/2018   Lab Results  Component Value Date   HGBA1C  12/14/2008    5.2 (NOTE) The ADA recommends the following therapeutic goal for glycemic control related to Hgb A1c measurement: Goal of therapy: <6.5 Hgb A1c  Reference:  American Diabetes Association: Clinical Practice Recommendations 2010, Diabetes Care, 2010, 33: (Suppl  1).   No results found for: CHOL, HDL, LDLCALC, LDLDIRECT, TRIG, CHOLHDL  Significant Diagnostic Results in last 30 days:  MR BRAIN W WO CONTRAST  Addendum Date: 06/23/2020   ADDENDUM REPORT: 06/23/2020 12:33 ADDENDUM: Abnormal appearance of the distal vertebral arteries which could reflect atherosclerosis and reduced flow versus segmental occlusion. Electronically Signed   By: Logan Bores M.D.   On: 06/23/2020 12:33   Result Date: 06/23/2020 CLINICAL DATA:  Cerebellar dysfunction. Delirium. Left-sided weakness. Symptoms for nearly 1 month. EXAM: MRI HEAD WITHOUT AND WITH CONTRAST TECHNIQUE: Multiplanar, multiecho pulse sequences of the brain and surrounding structures were obtained without and with intravenous contrast. CONTRAST:  29mL MULTIHANCE GADOBENATE DIMEGLUMINE 529 MG/ML IV SOLN COMPARISON:  Head CT 05/27/2020 FINDINGS: Brain: There are small cortical and subcortical infarcts in the right parietal and posterior right frontal lobes which are subacute in appearance with mild gyral enhancement and with greatest involvement of the white matter of the centrum semiovale. Subcentimeter subacute infarcts are noted in the left corona radiata. There are additional subcentimeter foci of cortical enhancement posteriorly in the left greater than right parietal lobes and in the posterior left frontal lobe also most consistent with subacute infarcts. Mild non-masslike enhancement as well as intrinsic T1 hyperintensity and susceptibility artifact in the right basal ganglia are also consistent with subacute infarcts with a small amount of associated subacute blood products. There is also a 1 cm acute to early subacute right cerebellar infarct. T2 hyperintensities elsewhere in the cerebral white matter bilaterally are nonspecific but compatible with mild chronic small vessel ischemic disease. A few scattered  chronic cerebral microhemorrhages are noted. No mass, midline shift, or extra-axial fluid collection is identified. Generalized cerebral atrophy is mild for age. Vascular: Partial loss of a normal flow void in the distal vertebral arteries. Skull and upper cervical spine: Unremarkable bone marrow signal. Sinuses/Orbits: Bilateral cataract extraction. Paranasal sinuses and mastoid air cells are clear. Other: None. IMPRESSION: 1. Small subacute infarcts scattered in the right greater than left cerebral hemispheres consistent with emboli. 2. Small acute to subacute right cerebellar infarct. 3. Mild chronic small vessel ischemic disease. These results will be called to the ordering clinician or representative by the Radiologist Assistant, and communication documented in the PACS or [employer].  Electronically Signed: By: Sebastian Ache M.D. On: 06/23/2020 10:02    Assessment/Plan 1. Non-intractable vomiting with nausea, unspecified vomiting type -seems the vomiting resolved, not exactly nauseous now either, just very little appetite and extremely picky about what she eats even at baseline--does like soups  2. Delirium with dementia -considerably worse at present in context of abnormal electrolytes, low platelets, fall, lack of intake -she's declined tremendously since her stroke event  3. Cardioembolic stroke (HCC) -led to cerebellar dysfunction and poor coordination with left side; falls often and has lack of awareness and dementia -recommend staying off eliquis and focusing on comfort   4. Recurrent falls So frequent and with head strike with her cerebellar dysfunction and not using walker that resuming eliquis even if platelets normalize seems took risky for her (and given her current poor prognosis with her lack of intake)  5. Hyperkalemia Hold potassium, was given lokelma Repeat bmp better Still waiting on liver panel  Recurrent falls and strikes head repeatedly   6. Thrombocytopenia  (HCC) Hold eliquis Still waiting on liver panel  Recurrent falls and strikes head repeatedly  Need MOST Recommend hospice for depression, failure to thrive, poor intake, weight loss, frailty; avoid hospitalization based on her expressed wish  Family/ staff Communication: called son x2 but could not reach him and missed him when he was apparently here  Labs/tests ordered:  Await liver panel with platelets of 7 and bruising  Kailash Hinze L. Idelia Caudell, D.O. Geriatrics [facility] [address], [place] [address] Cell Phone (Mon-Fri 8am-5pm):  [phone] On Call:  [phone] & follow prompts after 5pm & weekends Office Phone:  [phone] Office Fax:  [phone]

## 2020-07-12 NOTE — Progress Notes (Signed)
Location:  Medical illustrator of Service:  SNF (31) Provider:   Peggye Ley, ANP [facility] [phone]  Marden Noble, MD  Patient Care Team: Marden Noble, MD as PCP - General (Internal Medicine) Lyn Records, MD as PCP - Cardiology (Cardiology) Regan Lemming, MD as PCP - Electrophysiology (Cardiology) Van Clines, MD as Consulting Physician (Neurology)  Extended Emergency Contact Information Primary Emergency Contact: Elisabeth Cara of [place] Home Phone: [phone] Mobile Phone: [phone] Relation: Son Secondary Emergency Contact: Dimas Millin Address: [address]          [place], [place] Home Phone: [phone] Mobile Phone: [phone] Relation: Son  Code Status:  DNR Goals of care: Advanced Directive information Advanced Directives 06/27/2020  Does Patient Have a Medical Advance Directive? Yes  Type of Estate agent of [place];Living will;Out of facility DNR (pink MOST or yellow form)  Does patient want to make changes to medical advance directive? -  Copy of Healthcare Power of Attorney in Chart? -  Would patient like information on creating a medical advance directive? -  Pre-existing out of facility DNR order (yellow form or pink MOST form) -     Chief Complaint  Patient presents with  . Acute Visit    Nausea and vomiting, not sleeping     HPI:  Pt is a 87 y.o. female seen today for an acute visit for nausea and vomiting and not sleeping. Ms. Trippett resides in skilled care.  She has a hx of CHF and afib, along with memory loss. Recently her memory loss has been worsening with periods of confusion and agitation at night. She is not sleeping well. She has tried vistaril and Remeron and they have not been effective.  She recently had a CVA with symptoms of unsteady gait, confusion, and poor coordination, MRI below.   MRI 12/16/2021showed small  subacute infarcts scattered in the right greater than left cerebral hemispheres c/w emboli. Also small acute to subacute right cerebellar infarct and mild chronic small vessel disease. Addendum mentions atherosclerosis and reduced flow of the distal vertebral arteries vs segmental occlusion.   ON 12/30 she had some nausea with a small amt of vomit and abd discomfort. No fever. Bowels moving. Maalox was given and symptoms resolved  On 07/12/19 she also vomiting, had abd discomfort and felt nauseous. She was given zofran and felt better. She has not had a fever. Rapid covid test negative. She is a poor historian and can not elaborate but states now she feels better. LBM 07/12/19.    Ms. Sylvester continues to get out of bed and and falls. She has home care to help prevent falls.   Past Medical History:  Diagnosis Date  . Anxiety   . Arthritis    Some in neck and back, but "can't complain"  . Atypical atrial flutter (HCC)   . Breast cancer (HCC)   . Cancer [facility])    1998 Right breast cancer with lumpectomy and radiation therapy  . Chronic diastolic CHF (congestive heart failure) (HCC)   . Complication of anesthesia    Nausea from versed  . GERD (gastroesophageal reflux disease)   . Glaucoma   . Hiatal hernia   . History of mitral valve repair    Mitral valve prolapse, surgery in 2010  . Hyponatremia   . Normocytic anemia 03/21/2014  . Persistent atrial fibrillation (HCC)    s/p MAZE 2010  . Personal history of  radiation therapy   . Rheumatic fever    age 78  . Thrombocytopenia ([place])    Past Surgical History:  Procedure Laterality Date  . ABDOMINAL HYSTERECTOMY    . APPENDECTOMY    . BACK SURGERY     Synovial cyst on spine, removed laproscopically in 2000?  [name] BREAST BIOPSY    . BREAST LUMPECTOMY     right 1998  . BREAST SURGERY     1998 Lumpectomy in right breast  . CARDIAC CATHETERIZATION    . CARDIOVERSION  04/29/2012   Procedure: CARDIOVERSION;  Surgeon: [name], MD;   Location: [facility] ENDOSCOPY;  Service: Cardiovascular;  Laterality: N/A;  . CARDIOVERSION  06/19/2012   Procedure: CARDIOVERSION;  Surgeon: Sinclair Grooms, MD;  Location: [facility] ENDOSCOPY;  Service: Cardiovascular;  Laterality: N/A;  h/p from 11/8 in file drawer/dl  . CATARACT EXTRACTION    . CESAREAN SECTION     Three C-Sections  . EYE SURGERY     Cataract Surgery, both eyes  . KIDNEY CYST REMOVAL    . MITRAL VALVE REPAIR  June 2010  . TEE WITHOUT CARDIOVERSION  04/29/2012   Procedure: TRANSESOPHAGEAL ECHOCARDIOGRAM (TEE);  Surgeon: [name], MD;  Location: [facility] ENDOSCOPY;  Service: Cardiovascular;  Laterality: N/A;  . TONSILLECTOMY      Allergies  Allergen Reactions  . Amiodarone Other (See Comments)    "Sick"- "allergic," per MAR  . Amoxicillin Nausea And Vomiting  . Amoxicillin-Pot Clavulanate Nausea And Vomiting  . Aspirin Other (See Comments)    Unknown... 325 mg and more.  Elsie Amis Other (See Comments)    "Allergic," per MAR  . Ciprofloxacin Other (See Comments)    Flu symptoms and "Allergic," per MAR  . Codeine Nausea And Vomiting and Other (See Comments)    "Allergic," per MAR  . Dairycare [Lactase-Lactobacillus] Other (See Comments)    "Allergic," per MAR  . Diuretic [Buchu-Cornsilk-Ch Grass-Hydran] Other (See Comments)    Per Pharmacy record  . Fioricet-Codeine [Butalbital-Apap-Caff-Cod] Nausea And Vomiting and Other (See Comments)    "Allergic," per MAR  . Guaifenesin & Derivatives Other (See Comments)    "Allergic," per MAR  . Iohexol Hives     Code: HIVES, Desc: **11/22/08 **NO REACTION W/ OMNI 300// PT S/P 3 CT SCANS W/ IV CM AND NO HIVES/MMS**PT STATED HIVES 16 YRS AGO. POSSIBLE IV DYE W/ CT/XRAY. MEDICATED W/ 50MG  OF BENEDRYL PO PRIOR TO SCAN, Onset Date: [id number]   . Latanoprost     Other reaction(s): redness  . Lisinopril Other (See Comments) and Cough    "Allergic," per MAR  . Midazolam Hcl Nausea And Vomiting  . Milk-Related Compounds Other (See  Comments)    "Allergic," per MAR  . Quinolones Other (See Comments)    Flu like symptoms  . Sotalol Other (See Comments)    "Allergic," per MAR  . Sulfa Antibiotics Other (See Comments)    Flu-like symptoms 'serum sickness' and "Allergic," per MAR  . Thioxanthenes Other (See Comments)    Flu like symptoms 'serum sickness'  . Tyramine     Other reaction(s): headache  . Valium Nausea And Vomiting and Other (See Comments)    "Allergic," per MAR  . Vancomycin Other (See Comments)    "Allergic," per MAR  . Versed [Midazolam] Nausea And Vomiting and Other (See Comments)    "Allergic," per MAR  . Vinegar [Acetic Acid] Other (See Comments)    "Allergic," per [facility]    Outpatient Encounter Medications as  of 07/11/2020  Medication Sig  . traZODone (DESYREL) 50 MG tablet Take 25 mg by mouth at bedtime. Take 25 mg qhs for 3 days then increase to 50 mg qhs  . acetaminophen (TYLENOL) 500 MG tablet Take 500 mg by mouth 3 (three) times daily as needed for mild pain, fever or headache.   [name] apixaban (ELIQUIS) 2.5 MG TABS tablet Take 2.5 mg by mouth 2 (two) times daily.   [name] estradiol (ESTRACE) 0.1 MG/GM vaginal cream Place 1 g vaginally See admin instructions. Apply 1 gram vaginally on Tuesday and Friday nights  . furosemide (LASIX) 40 MG tablet Take 1 tablet (40 mg total) by mouth daily. Take one tablet (40 mg) by mouth every evening for 3 days - 05/21/2020 thru 05/23/2020  . hydrOXYzine (VISTARIL) 25 MG capsule Take 25 mg by mouth daily as needed. May repeat x 1 each night if not effective  . metoprolol tartrate (LOPRESSOR) 25 MG tablet Take 25 mg by mouth daily. Take 25 mg PO at bedtime for CHF  . metoprolol tartrate (LOPRESSOR) 50 MG tablet Take 50 mg by mouth daily. Special Instruction: For CHF Once a morning; 8:00am- 9:00 am  . NON FORMULARY Take 2 capsules by mouth See admin instructions. Arthri-D3 dietary supplement (glucosamine with key plant extracts)- Take 2 capsules by mouth in the morning  .  ondansetron (ZOFRAN) 4 MG tablet Take 4 mg by mouth every 6 (six) hours as needed for nausea or vomiting.   . polyethylene glycol (MIRALAX / GLYCOLAX) packet Take 17 g by mouth daily. Mix in water or beverage of choice  . potassium chloride SA (KLOR-CON) 20 MEQ tablet Take 1 tablet (20 mEq total) by mouth daily. Take one tablet (20 meq) by mouth every evening for 3 days - 05/21/2020 thru 05/23/2020 (Patient taking differently: Take 20 mEq by mouth daily.)  . senna-docusate (SENOKOT-S) 8.6-50 MG tablet Take 1 tablet by mouth See admin instructions. Take 1 tablet by mouth daily and hold for loose stools  . Ubiquinol 100 MG CAPS Take 100 mg by mouth daily.  . vitamin B-12 (CYANOCOBALAMIN) 500 MCG tablet Take 500 mcg by mouth daily.  . [DISCONTINUED] Chia Seed POWD Take 1 Scoop by mouth daily as needed (in morning smoothies for health benefits).   . [DISCONTINUED] D-Mannose POWD Take 5 mLs by mouth See admin instructions. Mix 5 ml's of reconstituted powder in a glass of juice daily and drink  . [DISCONTINUED] Flaxseed, Linseed, (FLAX SEEDS) POWD Take by mouth See admin instructions. Mix 2 spoonfuls in smoothie every morning  . [DISCONTINUED] Melatonin 1 MG SUBL Place 2 sprays under the tongue at bedtime. 3 mg qhs  . [DISCONTINUED] mirtazapine (REMERON) 7.5 MG tablet Take 1 tablet (7.5 mg total) by mouth at bedtime.  . [DISCONTINUED] Multiple Vitamins-Minerals (LUTEIN-ZEAXANTHIN) TABS Take 1 capsule by mouth in the morning.  . [DISCONTINUED] Omega-3 Fatty Acids (FISH OIL) 1000 MG CAPS Take 2,000 mg by mouth daily.  . [DISCONTINUED] OVER THE COUNTER MEDICATION Take 1 tablet by mouth daily as needed (GI upset). Digest Gold  . [DISCONTINUED] Probiotic Product (PROBIOTIC PO) Take 1 capsule by mouth daily.  . [DISCONTINUED] vitamin C (ASCORBIC ACID) 250 MG tablet Take 750 mg by mouth daily.   No facility-administered encounter medications on file as of 07/11/2020.    Review of Systems  Constitutional:  Positive for activity change and appetite change. Negative for chills, diaphoresis, fatigue, fever and unexpected weight change.  HENT: Negative for congestion.   Respiratory: Negative  for cough, shortness of breath and wheezing.   Cardiovascular: Positive for leg swelling. Negative for chest pain and palpitations.  Gastrointestinal: Positive for abdominal pain, nausea and vomiting. Negative for abdominal distention, constipation and diarrhea.  Genitourinary: Negative for difficulty urinating and dysuria.  Musculoskeletal: Positive for gait problem. Negative for arthralgias, back pain, joint swelling and myalgias.  Neurological: Negative for dizziness, tremors, seizures, syncope, facial asymmetry, speech difficulty, weakness, light-headedness, numbness and headaches.  Psychiatric/Behavioral: Positive for agitation, behavioral problems, confusion and sleep disturbance. The patient is nervous/anxious.     Immunization History  Administered Date(s) Administered  . Influenza Split 05/01/2010, 05/03/2011, 04/28/2012, 06/17/2013  . Influenza, High Dose Seasonal PF 05/27/2017, 03/19/2018, 03/18/2019, 04/01/2020, 04/29/2020  . Moderna Sars-Covid-2 Vaccination 07/19/2019, 08/18/2019  . Pneumococcal Conjugate-13 12/31/2013  . Pneumococcal Polysaccharide-23 07/09/1996, 03/06/2016  . Td 07/10/1999  . Zoster 07/09/2005, 03/31/2019  . Zoster Recombinat (Shingrix) 03/31/2019   Pertinent  Health Maintenance Due  Topic Date Due  . DEXA SCAN  Never done  . INFLUENZA VACCINE  Completed  . PNA vac Low Risk Adult  Completed   Fall Risk  06/27/2020 03/15/2016  Falls in the past year? 1 Yes  Comment - Emmi Telephone Survey: data to providers prior to load  Number falls in past yr: 1 1  Comment - Emmi Telephone Survey Actual Response = 1  Injury with Fall? 0 No  Risk for fall due to : Impaired balance/gait;Impaired mobility -   Functional Status Survey:    Vitals:   07/12/20 0743  BP: (!) 103/47   Pulse: 80  Resp: 15  Temp: (!) 96.8 F (36 C)  SpO2: 100%   There is no height or weight on file to calculate BMI. Physical Exam Vitals and nursing note reviewed.  Constitutional:      General: She is not in acute distress.    Appearance: She is not diaphoretic.  HENT:     Head: Normocephalic and atraumatic.     Mouth/Throat:     Mouth: Mucous membranes are moist.     Pharynx: Oropharynx is clear.  Neck:     Vascular: No carotid bruit or JVD.  Cardiovascular:     Rate and Rhythm: Normal rate. Rhythm irregular.     Heart sounds: No murmur heard.   Pulmonary:     Effort: Pulmonary effort is normal. No respiratory distress.     Breath sounds: Normal breath sounds. No wheezing.  Abdominal:     General: Abdomen is flat. Bowel sounds are normal. There is no distension.     Palpations: Abdomen is soft. There is no mass.     Tenderness: There is no abdominal tenderness. There is no right CVA tenderness, left CVA tenderness, guarding or rebound.     Hernia: No hernia is present.  Musculoskeletal:        General: No swelling, tenderness, deformity or signs of injury.     Cervical back: No rigidity or tenderness.     Right lower leg: Edema (+1) present.     Left lower leg: Edema (+1) present.  Lymphadenopathy:     Cervical: No cervical adenopathy.  Skin:    General: Skin is warm and dry.     Findings: Bruising (arms and legs) present.  Neurological:     Mental Status: She is alert.     Motor: Weakness present.     Coordination: Coordination abnormal.     Gait: Gait abnormal.     Comments: Oriented to self and place but  not time. Able to f/c  Psychiatric:        Mood and Affect: Mood normal.     Labs reviewed: Recent Labs    05/22/20 1408 05/23/20 0225 05/24/20 0444 05/25/20 0248 05/27/20 1440  NA 130*  --  132* 131* 133*  K 3.6  --  3.6 3.4* 3.5  CL 95*  --  90* 91* 88*  CO2 23  --  25 26 31   GLUCOSE 105*  --  107* 117* 148*  BUN 17  --  14 16 19   CREATININE  0.77  --  0.82 0.82 0.89  CALCIUM 8.1*  --  9.0 8.9 9.2  MG 1.7 1.7  --   --   --   PHOS  --  3.3  --   --   --    Recent Labs    05/23/20 0404 05/24/20 0444 05/25/20 0248  AST 32 33 33  ALT 22 22 24   ALKPHOS 72 79 80  BILITOT 2.1* 2.2* 2.4*  PROT 6.5 6.7 6.3*  ALBUMIN 3.5 3.5 3.4*   Recent Labs    05/24/20 0444 05/25/20 0248 05/27/20 1440  WBC 6.8 7.6 7.9  NEUTROABS 4.9 5.6  --   HGB 12.5 12.2 13.9  HCT 37.2 36.5 42.1  MCV 104.2* 104.6* 110.5*  PLT 76* 75* 81*   Lab Results  Component Value Date   TSH 3.040 01/28/2018   Lab Results  Component Value Date   HGBA1C  12/14/2008    5.2 (NOTE) The ADA recommends the following therapeutic goal for glycemic control related to Hgb A1c measurement: Goal of therapy: <6.5 Hgb A1c  Reference: American Diabetes Association: Clinical Practice Recommendations 2010, Diabetes Care, 2010, 33: (Suppl  1).   No results found for: CHOL, HDL, LDLCALC, LDLDIRECT, TRIG, CHOLHDL  Significant Diagnostic Results in last 30 days:  MR BRAIN W WO CONTRAST  Addendum Date: 06/23/2020   ADDENDUM REPORT: 06/23/2020 12:33 ADDENDUM: Abnormal appearance of the distal vertebral arteries which could reflect atherosclerosis and reduced flow versus segmental occlusion. Electronically Signed   By: Logan Bores M.D.   On: 06/23/2020 12:33   Result Date: 06/23/2020 CLINICAL DATA:  Cerebellar dysfunction. Delirium. Left-sided weakness. Symptoms for nearly 1 month. EXAM: MRI HEAD WITHOUT AND WITH CONTRAST TECHNIQUE: Multiplanar, multiecho pulse sequences of the brain and surrounding structures were obtained without and with intravenous contrast. CONTRAST:  18mL MULTIHANCE GADOBENATE DIMEGLUMINE 529 MG/ML IV SOLN COMPARISON:  Head CT 05/27/2020 FINDINGS: Brain: There are small cortical and subcortical infarcts in the right parietal and posterior right frontal lobes which are subacute in appearance with mild gyral enhancement and with greatest involvement of the  white matter of the centrum semiovale. Subcentimeter subacute infarcts are noted in the left corona radiata. There are additional subcentimeter foci of cortical enhancement posteriorly in the left greater than right parietal lobes and in the posterior left frontal lobe also most consistent with subacute infarcts. Mild non-masslike enhancement as well as intrinsic T1 hyperintensity and susceptibility artifact in the right basal ganglia are also consistent with subacute infarcts with a small amount of associated subacute blood products. There is also a 1 cm acute to early subacute right cerebellar infarct. T2 hyperintensities elsewhere in the cerebral white matter bilaterally are nonspecific but compatible with mild chronic small vessel ischemic disease. A few scattered chronic cerebral microhemorrhages are noted. No mass, midline shift, or extra-axial fluid collection is identified. Generalized cerebral atrophy is mild for age. Vascular: Partial loss of a normal flow  void in the distal vertebral arteries. Skull and upper cervical spine: Unremarkable bone marrow signal. Sinuses/Orbits: Bilateral cataract extraction. Paranasal sinuses and mastoid air cells are clear. Other: None. IMPRESSION: 1. Small subacute infarcts scattered in the right greater than left cerebral hemispheres consistent with emboli. 2. Small acute to subacute right cerebellar infarct. 3. Mild chronic small vessel ischemic disease. These results will be called to the ordering clinician or representative by the Radiologist Assistant, and communication documented in the PACS or [employer]. Electronically Signed: By: Logan Bores M.D. On: 06/23/2020 10:02   CARDIAC EVENT MONITOR  Result Date: 06/18/2020  AStrial fibrillation, continuous with controlled rate.     Assessment/Plan 1. Non-intractable vomiting with nausea, unspecified vomiting type Improved after Zofran Normal abd exam Check labs due to hx of elevated bilirubin  Rapid  covid neg, PCR sent  Maintain isolation   2. Primary insomnia Discontinue Remeron and begin trazodone 25 mg qhs for 3 days then increase to 50 mg Continue vistaril as needed for itching, sleep or agitation  Recommend better sleep hygiene   3. Mild cognitive impairment with memory loss Worsening due to CVA and sleep issues.   4. Cardioembolic stroke ([place]) Led to issues with coordination and gait, as well as worsening confusion  Continue Eliquis 2.5 mg bid   Ms. Rosebrough has labs done at [facility] that show resolution of hyponatremia and elevated bilirubin after her hospitalization that need to abstracted put in epic  Family/ staff Communication: discussed with her sons Antony Haste and [name]   Labs/tests ordered:  CBC BMP hepatic function panel PCR

## 2020-07-14 ENCOUNTER — Encounter: Payer: Self-pay | Admitting: Adult Health

## 2020-07-14 ENCOUNTER — Non-Acute Institutional Stay (SKILLED_NURSING_FACILITY): Payer: Medicare Other | Admitting: Adult Health

## 2020-07-14 DIAGNOSIS — R627 Adult failure to thrive: Secondary | ICD-10-CM

## 2020-07-14 DIAGNOSIS — G9341 Metabolic encephalopathy: Secondary | ICD-10-CM | POA: Diagnosis not present

## 2020-07-14 DIAGNOSIS — N179 Acute kidney failure, unspecified: Secondary | ICD-10-CM

## 2020-07-14 DIAGNOSIS — D696 Thrombocytopenia, unspecified: Secondary | ICD-10-CM | POA: Diagnosis not present

## 2020-07-14 DIAGNOSIS — R41841 Cognitive communication deficit: Secondary | ICD-10-CM | POA: Diagnosis not present

## 2020-07-14 DIAGNOSIS — R17 Unspecified jaundice: Secondary | ICD-10-CM | POA: Diagnosis not present

## 2020-07-14 DIAGNOSIS — I5033 Acute on chronic diastolic (congestive) heart failure: Secondary | ICD-10-CM | POA: Diagnosis not present

## 2020-07-14 DIAGNOSIS — R296 Repeated falls: Secondary | ICD-10-CM | POA: Diagnosis not present

## 2020-07-14 DIAGNOSIS — E875 Hyperkalemia: Secondary | ICD-10-CM | POA: Diagnosis not present

## 2020-07-14 LAB — BASIC METABOLIC PANEL
BUN: 79 — AB (ref 4–21)
CO2: 18 (ref 13–22)
Chloride: 97 — AB (ref 99–108)
Creatinine: 1.7 — AB (ref 0.5–1.1)
Glucose: 119
Potassium: 4.5 (ref 3.4–5.3)
Sodium: 134 — AB (ref 137–147)

## 2020-07-14 LAB — HEPATIC FUNCTION PANEL
ALT: 51 — AB (ref 7–35)
AST: 72 — AB (ref 13–35)
Alkaline Phosphatase: 171 — AB (ref 25–125)
Bilirubin, Direct: 1.52 — AB (ref 0.01–0.4)
Bilirubin, Total: 2.8

## 2020-07-14 LAB — COMPREHENSIVE METABOLIC PANEL
Albumin: 3.3 — AB (ref 3.5–5.0)
Calcium: 8.4 — AB (ref 8.7–10.7)

## 2020-07-14 NOTE — Progress Notes (Signed)
Location:  Occupational psychologist of Service:  SNF (31) Provider:  Cindi Carbon, ANP [place] [phone]  Gayland Curry, DO  Patient Care Team: Gayland Curry, DO as PCP - General (Geriatric Medicine) Belva Crome, MD as PCP - Cardiology (Cardiology) Constance Haw, MD as PCP - Electrophysiology (Cardiology) Cameron Sprang, MD as Consulting Physician (Neurology)  Extended Emergency Contact Information Primary Emergency Contact: Ival Bible of [place] Phone: [phone] Mobile Phone: [phone] Relation: Son Secondary Emergency Contact: Berneda Rose Address: [address]          [place], [place] Phone: [phone] Mobile Phone: [phone] Relation: Son  Code Status:  DNR Goals of care: Advanced Directive information Advanced Directives 06/27/2020  Does Patient Have a Medical Advance Directive? Yes  Type of Paramedic of [place];Living will;Out of facility DNR (pink MOST or yellow form)  Does patient want to make changes to medical advance directive? -  Copy of [place] in Chart? -  Would patient like information on creating a medical advance directive? -  Pre-existing out of facility DNR order (yellow form or pink MOST form) -     Chief Complaint  Patient presents with   Acute Visit    F/u decreased appetite and thrombocytopenia    HPI:  Pt is a 87 y.o. female seen today for an acute visit for f/u regarding decreased appetite and thrombocytopenia.  Ms. 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  She continues with confusion, gait issues, and poor coordination. She makes attempts to get up without help and falls frequently. Sleeping slightly better with trazodone but also sleeps during the day.   On 1/3 she  had decreased appetite and n/v.  Rapid covid neg. PCR done and returned negative. Pt is vaccinated and boosted. No issues with constipation. Labs were drawn with WBC 9, Hgb 12, MCG 106.8, Plt 7, BUN 66.7, Cr 1.41, Na 135 and K 6.1.  Hepatic function panel ordered and shows elevated bilirubin direct and total, as well as elevated alk phos and LFTs.   BMP f/u K 4.5   She was given Lokelma and Eliquis placed on hold.    Past Medical History:  Diagnosis Date   Anxiety    Arthritis    Some in neck and back, but "can't complain"   Atypical atrial flutter ([place])    Breast cancer ([place])    Cancer ([place])    1998 Right breast cancer with lumpectomy and radiation therapy   Chronic diastolic CHF (congestive heart failure) (HCC)    Complication of anesthesia    Nausea from versed   GERD (gastroesophageal reflux disease)    Glaucoma    Hiatal hernia    History of mitral valve repair    Mitral valve prolapse, surgery in 2010   Hyponatremia    Normocytic anemia 03/21/2014   Persistent atrial fibrillation ([place])    s/p MAZE 2010   Personal history of radiation therapy    Rheumatic fever    age 76   Thrombocytopenia ([place])    Past Surgical History:  Procedure Laterality Date   ABDOMINAL HYSTERECTOMY     APPENDECTOMY     BACK SURGERY     Synovial cyst on spine, removed laproscopically in 2000?   BREAST BIOPSY  BREAST LUMPECTOMY     right [place] Lumpectomy in right breast   CARDIAC CATHETERIZATION     CARDIOVERSION  04/29/2012   Procedure: CARDIOVERSION;  Surgeon: [name], MD;  Location: [facility] ENDOSCOPY;  Service: Cardiovascular;  Laterality: N/A;   CARDIOVERSION  06/19/2012   Procedure: CARDIOVERSION;  Surgeon: Sinclair Grooms, MD;  Location: [facility] ENDOSCOPY;  Service: Cardiovascular;  Laterality: N/A;  h/p from 11/8 in file drawer/dl   CATARACT EXTRACTION     CESAREAN SECTION     Three C-Sections   EYE SURGERY     Cataract Surgery,  both eyes   KIDNEY CYST REMOVAL     MITRAL VALVE REPAIR  June 2010   TEE WITHOUT CARDIOVERSION  04/29/2012   Procedure: TRANSESOPHAGEAL ECHOCARDIOGRAM (TEE);  Surgeon: [name], MD;  Location: [facility] ENDOSCOPY;  Service: Cardiovascular;  Laterality: N/A;   TONSILLECTOMY      Allergies  Allergen Reactions   Amiodarone Other (See Comments)    "Sick"- "allergic," per [facility]   Amoxicillin Nausea And Vomiting   Amoxicillin-Pot Clavulanate Nausea And Vomiting   Aspirin Other (See Comments)    Unknown... 325 mg and more.   Cheese Other (See Comments)    "Allergic," per [facility]   Ciprofloxacin Other (See Comments)    Flu symptoms and "Allergic," per [facility]   Codeine Nausea And Vomiting and Other (See Comments)    "Allergic," per [facility]   Dairycare [Lactase-Lactobacillus] Other (See Comments)    "Allergic," per [facility]   Diuretic [Buchu-Cornsilk-Ch Grass-Hydran] Other (See Comments)    Per Pharmacy record   Fioricet-Codeine [Butalbital-Apap-Caff-Cod] Nausea And Vomiting and Other (See Comments)    "Allergic," per [facility]   Guaifenesin & Derivatives Other (See Comments)    "Allergic," per [facility]   Iohexol Hives     Code: HIVES, Desc: **11/22/08 **NO REACTION W/ OMNI 300// PT S/P 3 CT SCANS W/ IV CM AND NO HIVES/MMS**PT STATED HIVES 16 YRS AGO. POSSIBLE IV DYE W/ CT/XRAY. MEDICATED W/ 50MG OF BENEDRYL PO PRIOR TO SCAN, Onset Date: [id number]    Latanoprost     Other reaction(s): redness   Lisinopril Other (See Comments) and Cough    "Allergic," per [facility]   Midazolam Hcl Nausea And Vomiting   Milk-Related Compounds Other (See Comments)    "Allergic," per [facility]   Quinolones Other (See Comments)    Flu like symptoms   Sotalol Other (See Comments)    "Allergic," per [facility]   Sulfa Antibiotics Other (See Comments)    Flu-like symptoms 'serum sickness' and "Allergic," per [facility]   Thioxanthenes Other (See Comments)    Flu like symptoms 'serum sickness'   Tyramine     Other reaction(s):  headache   Valium Nausea And Vomiting and Other (See Comments)    "Allergic," per [facility]   Vancomycin Other (See Comments)    "Allergic," per [facility]   Versed [Midazolam] Nausea And Vomiting and Other (See Comments)    "Allergic," per [facility]   Vinegar [Acetic Acid] Other (See Comments)    "Allergic," per [facility]    Outpatient Encounter Medications as of 07/14/2020  Medication Sig   acetaminophen (TYLENOL) 500 MG tablet Take 500 mg by mouth 3 (three) times daily as needed for mild pain, fever or headache.    apixaban (ELIQUIS) 2.5 MG TABS tablet Take 2.5 mg by mouth 2 (two) times daily.    estradiol (ESTRACE) 0.1 MG/GM vaginal cream Place 1 g vaginally See  admin instructions. Apply 1 gram vaginally on Tuesday and Friday nights   furosemide (LASIX) 40 MG tablet Take 1 tablet (40 mg total) by mouth daily. Take one tablet (40 mg) by mouth every evening for 3 days - 05/21/2020 thru 05/23/2020   hydrOXYzine (VISTARIL) 25 MG capsule Take 25 mg by mouth daily as needed. May repeat x 1 each night if not effective   metoprolol tartrate (LOPRESSOR) 25 MG tablet Take 25 mg by mouth daily. Take 25 mg PO at bedtime for CHF   metoprolol tartrate (LOPRESSOR) 50 MG tablet Take 50 mg by mouth daily. Special Instruction: For CHF Once a morning; 8:00am- 9:00 am   NON FORMULARY Take 2 capsules by mouth See admin instructions. Arthri-D3 dietary supplement (glucosamine with key plant extracts)- Take 2 capsules by mouth in the morning   ondansetron (ZOFRAN) 4 MG tablet Take 4 mg by mouth every 6 (six) hours as needed for nausea or vomiting.    polyethylene glycol (MIRALAX / GLYCOLAX) packet Take 17 g by mouth daily. Mix in water or beverage of choice   potassium chloride SA (KLOR-CON) 20 MEQ tablet Take 1 tablet (20 mEq total) by mouth daily. Take one tablet (20 meq) by mouth every evening for 3 days - 05/21/2020 thru 05/23/2020 (Patient taking differently: Take 20 mEq by mouth daily.)   senna-docusate (SENOKOT-S)  8.6-50 MG tablet Take 1 tablet by mouth See admin instructions. Take 1 tablet by mouth daily and hold for loose stools   traZODone (DESYREL) 50 MG tablet Take 25 mg by mouth at bedtime. Take 25 mg qhs for 3 days then increase to 50 mg qhs   Ubiquinol 100 MG CAPS Take 100 mg by mouth daily.   vitamin B-12 (CYANOCOBALAMIN) 500 MCG tablet Take 500 mcg by mouth daily.   No facility-administered encounter medications on file as of 07/14/2020.    Review of Systems  Constitutional: Positive for activity change, appetite change and fatigue. Negative for chills, diaphoresis, fever and unexpected weight change.  HENT: Negative for congestion.   Respiratory: Negative for cough, shortness of breath and wheezing.   Cardiovascular: Positive for leg swelling. Negative for chest pain and palpitations.  Gastrointestinal: Negative for abdominal distention, abdominal pain, constipation and diarrhea.  Genitourinary: Negative for difficulty urinating and dysuria.  Musculoskeletal: Positive for gait problem. Negative for arthralgias, back pain, joint swelling and myalgias.  Skin: Positive for color change.  Neurological: Positive for weakness. Negative for dizziness, tremors, seizures, syncope, facial asymmetry, speech difficulty, light-headedness, numbness and headaches.       Poor coordination  Psychiatric/Behavioral: Positive for confusion and sleep disturbance. Negative for agitation and behavioral problems.    Immunization History  Administered Date(s) Administered   Influenza Split 05/01/2010, 05/03/2011, 04/28/2012, 06/17/2013   Influenza, High Dose Seasonal PF 05/27/2017, 03/19/2018, 03/18/2019, 04/01/2020, 04/29/2020   Moderna Sars-Covid-2 Vaccination 07/19/2019, 08/18/2019   Pneumococcal Conjugate-13 12/31/2013   Pneumococcal Polysaccharide-23 07/09/1996, 03/06/2016   Td 07/10/1999   Zoster 07/09/2005, 03/31/2019   Zoster Recombinat (Shingrix) 03/31/2019   Pertinent  Health Maintenance  Due  Topic Date Due   DEXA SCAN  Never done   INFLUENZA VACCINE  Completed   PNA vac Low Risk Adult  Completed   Fall Risk  06/27/2020 03/15/2016  Falls in the past year? 1 Yes  Comment - Emmi Telephone Survey: data to providers prior to load  Number falls in past yr: 1 1  Comment - Emmi Telephone Survey Actual Response = 1  Injury with Fall? 0  No  Risk for fall due to : Impaired balance/gait;Impaired mobility -   Functional Status Survey:    Vitals:   07/14/20 1427  BP: 110/60  Pulse: 73  Resp: 18  Temp: (!) 97 F (36.1 C)  SpO2: 95%  Weight: 112 lb (50.8 kg)   Body mass index is 20.49 kg/m. Physical Exam Vitals and nursing note reviewed.  Constitutional:      General: She is not in acute distress.    Appearance: She is not diaphoretic.  HENT:     Head: Normocephalic and atraumatic.     Mouth/Throat:     Mouth: Mucous membranes are moist.     Pharynx: Oropharynx is clear.  Eyes:     Conjunctiva/sclera: Conjunctivae normal.     Pupils: Pupils are equal, round, and reactive to light.  Neck:     Vascular: No JVD.  Cardiovascular:     Rate and Rhythm: Normal rate and regular rhythm.     Heart sounds: No murmur heard.   Pulmonary:     Effort: Pulmonary effort is normal. No respiratory distress.     Breath sounds: Normal breath sounds. No wheezing.  Abdominal:     General: Abdomen is flat. Bowel sounds are normal. There is no distension.     Palpations: Abdomen is soft. There is no mass.     Tenderness: There is no abdominal tenderness. There is no right CVA tenderness, left CVA tenderness, guarding or rebound.     Hernia: No hernia is present.  Musculoskeletal:     Right lower leg: Edema present.     Left lower leg: Edema present.  Skin:    General: Skin is warm and dry.     Coloration: Skin is jaundiced (slight).     Findings: Bruising (arms, leg forhead) present.  Neurological:     Mental Status: She is alert.     Comments: Alert and oriented to placed  and self. Confused about the time and the details of her care. Has poor gait and coordination  Psychiatric:        Mood and Affect: Mood normal.      Labs reviewed: Recent Labs    05/22/20 1408 05/23/20 0225 05/24/20 0444 05/25/20 0248 05/27/20 1440 07/12/20 0000  NA 130*  --  132* 131* 133* 135*  K 3.6  --  3.6 3.4* 3.5 6.1*  CL 95*  --  90* 91* 88* 99  CO2 23  --  _0 GLUCOSE 105*  --  107* 117* 148*  --   BUN 17  --  _1 67*  CREATININE 0.77  --  0.82 0.82 0.89 1.4*  CALCIUM 8.1*  --  9.0 8.9 9.2 9.0  MG 1.7 1.7  --   --   --   --   PHOS  --  3.3  --   --   --   --    Recent Labs    05/23/20 0404 05/24/20 0444 05/25/20 0248  AST 32 33 33  ALT _2 ALKPHOS 72 79 80  BILITOT 2.1* 2.2* 2.4*  PROT 6.5 6.7 6.3*  ALBUMIN 3.5 3.5 3.4*   Recent Labs    05/24/20 0444 05/25/20 0248 05/27/20 1440 07/12/20 0000  WBC 6.8 7.6 7.9 9.0  NEUTROABS 4.9 5.6  --   --   HGB 12.5 12.2 13.9 12.0  HCT 37.2 36.5 42.1 34*  MCV 104.2* 104.6* 110.5*  --   PLT 76*  75* 81* 7*   Lab Results  Component Value Date   TSH 3.040 01/28/2018   Lab Results  Component Value Date   HGBA1C  12/14/2008    5.2 (NOTE) The ADA recommends the following therapeutic goal for glycemic control related to Hgb A1c measurement: Goal of therapy: <6.5 Hgb A1c  Reference: American Diabetes Association: Clinical Practice Recommendations 2010, Diabetes Care, 2010, 33: (Suppl  1).   No results found for: CHOL, HDL, LDLCALC, LDLDIRECT, TRIG, CHOLHDL  Significant Diagnostic Results in last 30 days:  MR BRAIN W WO CONTRAST  Addendum Date: 06/23/2020   ADDENDUM REPORT: 06/23/2020 12:33 ADDENDUM: Abnormal appearance of the distal vertebral arteries which could reflect atherosclerosis and reduced flow versus segmental occlusion. Electronically Signed   By: Logan Bores M.D.   On: 06/23/2020 12:33   Result Date: 06/23/2020 CLINICAL DATA:  Cerebellar dysfunction. Delirium. Left-sided  weakness. Symptoms for nearly 1 month. EXAM: MRI HEAD WITHOUT AND WITH CONTRAST TECHNIQUE: Multiplanar, multiecho pulse sequences of the brain and surrounding structures were obtained without and with intravenous contrast. CONTRAST:  80m MULTIHANCE GADOBENATE DIMEGLUMINE 529 MG/ML IV SOLN COMPARISON:  Head CT 05/27/2020 FINDINGS: Brain: There are small cortical and subcortical infarcts in the right parietal and posterior right frontal lobes which are subacute in appearance with mild gyral enhancement and with greatest involvement of the white matter of the centrum semiovale. Subcentimeter subacute infarcts are noted in the left corona radiata. There are additional subcentimeter foci of cortical enhancement posteriorly in the left greater than right parietal lobes and in the posterior left frontal lobe also most consistent with subacute infarcts. Mild non-masslike enhancement as well as intrinsic T1 hyperintensity and susceptibility artifact in the right basal ganglia are also consistent with subacute infarcts with a small amount of associated subacute blood products. There is also a 1 cm acute to early subacute right cerebellar infarct. T2 hyperintensities elsewhere in the cerebral white matter bilaterally are nonspecific but compatible with mild chronic small vessel ischemic disease. A few scattered chronic cerebral microhemorrhages are noted. No mass, midline shift, or extra-axial fluid collection is identified. Generalized cerebral atrophy is mild for age. Vascular: Partial loss of a normal flow void in the distal vertebral arteries. Skull and upper cervical spine: Unremarkable bone marrow signal. Sinuses/Orbits: Bilateral cataract extraction. Paranasal sinuses and mastoid air cells are clear. Other: None. IMPRESSION: 1. Small subacute infarcts scattered in the right greater than left cerebral hemispheres consistent with emboli. 2. Small acute to subacute right cerebellar infarct. 3. Mild chronic small vessel  ischemic disease. These results will be called to the ordering clinician or representative by the Radiologist Assistant, and communication documented in the PACS or [employer] Electronically Signed: By: ALogan BoresM.D. On: 06/23/2020 10:02   CARDIAC EVENT MONITOR  Result Date: 06/18/2020  AStrial fibrillation, continuous with controlled rate.     Assessment/Plan 1. Thrombocytopenia (HNash Severe  Unclear etiology Discussed with her son and given poor quality of life s/p CVA and skilled are admission we will not pursue any additional work up  2. Elevated Bilirubin Obstructive pattern but currently pt has no fever or abd pain ?malginancy, gall bladder issue No further work up due to goals of care Morphine is ordered as needed for pain or sob. She has nausea with codeine and so if she experienced nausea with morphine they can pre med with zofran   3. FTT Ms. WVerhagenis not eating and drinking and appears to be nearing the end of life. Hospice was  offered but her family feels they have enough support through [facility]. A most form was filled out indicating no hospitalizations, no feeding tubes, no antibiotics, and no IVF. Metoprolol and lasix discontinued due to low bp.   4. Hyperkalemia Resolved   5. ARF Off lasix Comfort care   Family/ staff Communication: discussed with her son Moreen Fowler ordered:  NA

## 2020-07-15 DIAGNOSIS — G9341 Metabolic encephalopathy: Secondary | ICD-10-CM | POA: Diagnosis not present

## 2020-07-15 DIAGNOSIS — R41841 Cognitive communication deficit: Secondary | ICD-10-CM | POA: Diagnosis not present

## 2020-07-15 DIAGNOSIS — R296 Repeated falls: Secondary | ICD-10-CM | POA: Diagnosis not present

## 2020-07-15 DIAGNOSIS — I5033 Acute on chronic diastolic (congestive) heart failure: Secondary | ICD-10-CM | POA: Diagnosis not present

## 2020-07-15 MED ORDER — MORPHINE SULFATE (CONCENTRATE) 20 MG/ML PO SOLN: 5.0000 mg | Freq: Three times a day (TID) | ORAL | 0 refills | Status: AC | PRN

## 2020-07-16 ENCOUNTER — Encounter: Payer: Self-pay | Admitting: Internal Medicine

## 2020-07-18 ENCOUNTER — Other Ambulatory Visit: Payer: Self-pay | Admitting: Adult Health

## 2020-07-18 DIAGNOSIS — R296 Repeated falls: Secondary | ICD-10-CM | POA: Diagnosis not present

## 2020-07-18 DIAGNOSIS — R41841 Cognitive communication deficit: Secondary | ICD-10-CM | POA: Diagnosis not present

## 2020-07-18 DIAGNOSIS — G9341 Metabolic encephalopathy: Secondary | ICD-10-CM | POA: Diagnosis not present

## 2020-07-18 DIAGNOSIS — I5033 Acute on chronic diastolic (congestive) heart failure: Secondary | ICD-10-CM | POA: Diagnosis not present

## 2020-07-18 MED ORDER — ALPRAZOLAM 0.5 MG PO TABS: 0.5000 mg | ORAL_TABLET | Freq: Four times a day (QID) | ORAL | 0 refills | Status: AC | PRN

## 2020-07-18 NOTE — Progress Notes (Signed)
Pt has anxiety and restless at night. Not responsive to vistrail, remeron, or trazodone. Will try xanax. Hx of n/v with valium but can premed with zofran as needed.

## 2020-07-20 ENCOUNTER — Encounter: Payer: Self-pay | Admitting: *Deleted

## 2020-07-22 ENCOUNTER — Encounter: Payer: Self-pay | Admitting: Adult Health

## 2020-07-22 ENCOUNTER — Non-Acute Institutional Stay (SKILLED_NURSING_FACILITY): Payer: Medicare Other | Admitting: Adult Health

## 2020-07-22 DIAGNOSIS — F419 Anxiety disorder, unspecified: Secondary | ICD-10-CM | POA: Diagnosis not present

## 2020-07-22 DIAGNOSIS — D696 Thrombocytopenia, unspecified: Secondary | ICD-10-CM | POA: Diagnosis not present

## 2020-07-22 NOTE — Progress Notes (Signed)
Location:  [facility] Room Number: 155 Place of Service:  SNF (31) Provider:  Kenard Gower, DNP, FNP-BC  Patient Care Team: Kermit Balo, DO as PCP - General (Geriatric Medicine) Lyn Records, MD as PCP - Cardiology (Cardiology) Regan Lemming, MD as PCP - Electrophysiology (Cardiology) Van Clines, MD as Consulting Physician (Neurology)  Extended Emergency Contact Information Primary Emergency Contact: Elisabeth Cara of [place] Home Phone: [phone] Mobile Phone: [phone] Relation: Son Secondary Emergency Contact: Dimas Millin Address: [address]          [place], [place] Home Phone: [phone] Mobile Phone: [phone] Relation: Son  Code Status:   DNR  Goals of care: Advanced Directive information Advanced Directives 07/22/2020  Does Patient Have a Medical Advance Directive? Yes  Type of Advance Directive Out of facility DNR (pink MOST or yellow form)  Does patient want to make changes to medical advance directive? No - Patient declined  Copy of Healthcare Power of Attorney in Chart? -  Would patient like information on creating a medical advance directive? -  Pre-existing out of facility DNR order (yellow form or pink MOST form) Pink MOST form placed in chart (order not valid for inpatient use)     Chief Complaint  Patient presents with  . Acute Visit    Comfort Care     HPI:  Pt is a 87 y.o. female seen today for comfort care. She is a long-term care resident of [facility]. She has a PMH of atypical atrial flutter, chronic diastolic CHF, thrombocytopenia and GERD. She was seen in her room today with a sitter at bedside. She has axillary temperature of 98.3 with O2 sat of 87%. She does not appear to be short of breath. She has her eyes closed and yelling,"Why, why, why?" staff reported that she is not eating X 2 days. She has a MOST  form stating no transfer to hospital and on comfort measures only. Latest platelet = 7, 07/12/20. Bilateral lower extremitie has bruising and 2+edema. She is currently om Morphine 5 mg orally Q 8 hours PRN. Other oral medications were already discontinued.  Past Medical History:  Diagnosis Date  . Anxiety   . Arthritis    Some in neck and back, but "can't complain"  . Atypical atrial flutter (HCC)   . Breast cancer (HCC)   . Cancer [facility])    1998 Right breast cancer with lumpectomy and radiation therapy  . Chronic diastolic CHF (congestive heart failure) (HCC)   . Complication of anesthesia    Nausea from versed  . GERD (gastroesophageal reflux disease)   . Glaucoma   . Hiatal hernia   . History of mitral valve repair    Mitral valve prolapse, surgery in 2010  . Hyponatremia   . Normocytic anemia 03/21/2014  . Persistent atrial fibrillation (HCC)    s/p MAZE 2010  . Personal history of radiation therapy   . Rheumatic fever    age 61  . Thrombocytopenia (HCC)    Past Surgical History:  Procedure Laterality Date  . ABDOMINAL HYSTERECTOMY    . APPENDECTOMY    . BACK SURGERY     Synovial cyst on spine, removed laproscopically in 2000?  [name] BREAST BIOPSY    . BREAST LUMPECTOMY     right 1998  . BREAST SURGERY     1998 Lumpectomy in right breast  . CARDIAC CATHETERIZATION    . CARDIOVERSION  04/29/2012   Procedure: CARDIOVERSION;  Surgeon: [name], MD;  Location: [facility] ENDOSCOPY;  Service: Cardiovascular;  Laterality: N/A;  . CARDIOVERSION  06/19/2012   Procedure: CARDIOVERSION;  Surgeon: Sinclair Grooms, MD;  Location: [facility] ENDOSCOPY;  Service: Cardiovascular;  Laterality: N/A;  h/p from 11/8 in file drawer/dl  . CATARACT EXTRACTION    . CESAREAN SECTION     Three C-Sections  . EYE SURGERY     Cataract Surgery, both eyes  . KIDNEY CYST REMOVAL    . MITRAL VALVE REPAIR  June 2010  . TEE WITHOUT CARDIOVERSION  04/29/2012   Procedure: TRANSESOPHAGEAL ECHOCARDIOGRAM (TEE);   Surgeon: [name], MD;  Location: [facility] ENDOSCOPY;  Service: Cardiovascular;  Laterality: N/A;  . TONSILLECTOMY      Allergies  Allergen Reactions  . Amiodarone Other (See Comments)    "Sick"- "allergic," per MAR  . Amoxicillin Nausea And Vomiting  . Amoxicillin-Pot Clavulanate Nausea And Vomiting  . Aspirin Other (See Comments)    Unknown... 325 mg and more.  Elsie Amis Other (See Comments)    "Allergic," per MAR  . Ciprofloxacin Other (See Comments)    Flu symptoms and "Allergic," per MAR  . Codeine Nausea And Vomiting and Other (See Comments)    "Allergic," per MAR  . Dairycare [Lactase-Lactobacillus] Other (See Comments)    "Allergic," per MAR  . Diuretic [Buchu-Cornsilk-Ch Grass-Hydran] Other (See Comments)    Per Pharmacy record  . Fioricet-Codeine [Butalbital-Apap-Caff-Cod] Nausea And Vomiting and Other (See Comments)    "Allergic," per MAR  . Guaifenesin & Derivatives Other (See Comments)    "Allergic," per MAR  . Iohexol Hives     Code: HIVES, Desc: **11/22/08 **NO REACTION W/ OMNI 300// PT S/P 3 CT SCANS W/ IV CM AND NO HIVES/MMS**PT STATED HIVES 16 YRS AGO. POSSIBLE IV DYE W/ CT/XRAY. MEDICATED W/ 50MG  OF BENEDRYL PO PRIOR TO SCAN, Onset Date: 16109604   . Latanoprost     Other reaction(s): redness  . Lisinopril Other (See Comments) and Cough    "Allergic," per MAR  . Midazolam Hcl Nausea And Vomiting  . Milk-Related Compounds Other (See Comments)    "Allergic," per MAR  . Quinolones Other (See Comments)    Flu like symptoms  . Sotalol Other (See Comments)    "Allergic," per MAR  . Sulfa Antibiotics Other (See Comments)    Flu-like symptoms 'serum sickness' and "Allergic," per MAR  . Thioxanthenes Other (See Comments)    Flu like symptoms 'serum sickness'  . Tyramine     Other reaction(s): headache  . Valium Nausea And Vomiting and Other (See Comments)    "Allergic," per MAR  . Vancomycin Other (See Comments)    "Allergic," per MAR  . Versed [Midazolam]  Nausea And Vomiting and Other (See Comments)    "Allergic," per MAR  . Vinegar [Acetic Acid] Other (See Comments)    "Allergic," per [facility]    Outpatient Encounter Medications as of 07/22/2020  Medication Sig  . ALPRAZolam (XANAX) 0.5 MG tablet Take 1 tablet (0.5 mg total) by mouth every 6 (six) hours as needed for anxiety or sleep.  . hydrOXYzine (VISTARIL) 25 MG capsule Take 25 mg by mouth daily as needed. May repeat x 1 each night if not effective  . morphine (ROXANOL) 20 MG/ML concentrated solution Take 0.25 mLs (5 mg total) by mouth every 8 (eight) hours as needed for severe pain.  [name] ondansetron (ZOFRAN) 4 MG tablet Take 4 mg by mouth every  6 (six) hours as needed for nausea or vomiting.   . polyethylene glycol (MIRALAX / GLYCOLAX) packet Take 17 g by mouth daily. Mix in water or beverage of choice  . senna-docusate (SENOKOT-S) 8.6-50 MG tablet Take 1 tablet by mouth See admin instructions. Take 1 tablet by mouth daily and hold for loose stools  . traZODone (DESYREL) 50 MG tablet Take 25 mg by mouth at bedtime. Take 25 mg qhs for 3 days then increase to 50 mg qhs   No facility-administered encounter medications on file as of 07/22/2020.    Review of Systems  Unable to obtain due to change in level of sensorium; resident is actively dying.    Immunization History  Administered Date(s) Administered  . Influenza Split 05/01/2010, 05/03/2011, 04/28/2012, 06/17/2013  . Influenza, High Dose Seasonal PF 05/27/2017, 03/19/2018, 03/18/2019, 04/01/2020, 04/29/2020  . Moderna Sars-Covid-2 Vaccination 07/19/2019, 08/18/2019  . Pneumococcal Conjugate-13 12/31/2013  . Pneumococcal Polysaccharide-23 07/09/1996, 03/06/2016  . Td 07/10/1999  . Zoster 07/09/2005, 03/31/2019  . Zoster Recombinat (Shingrix) 03/31/2019   Pertinent  Health Maintenance Due  Topic Date Due  . DEXA SCAN  Never done  . INFLUENZA VACCINE  Completed  . PNA vac Low Risk Adult  Completed   Fall Risk  06/27/2020 03/15/2016   Falls in the past year? 1 Yes  Comment - Emmi Telephone Survey: data to providers prior to load  Number falls in past yr: 1 1  Comment - Emmi Telephone Survey Actual Response = 1  Injury with Fall? 0 No  Risk for fall due to : Impaired balance/gait;Impaired mobility -     Vitals:   07/22/20 1406  BP: 122/69  Pulse: 84  Temp: 97.8 F (36.6 C)  SpO2: 97%  Weight: 112 lb (50.8 kg)  Height: 5\' 2"  (1.575 m)   Body mass index is 20.49 kg/m.  Physical Exam  GENERAL APPEARANCE:  Normal body habitus SKIN:  Bruising on BLE EYES: has bilateral eyes closed MOUTH and THROAT: Lips are without lesions.  RESPIRATORY: Breathing is even & unlabored, BS CTAB CARDIAC: RRR, no murmur,no extra heart sounds, BLE 2+ edema GI: Abdomen soft, NEUROLOGICAL: There is no tremor.   PSYCHIATRIC:  Yelling "why?"  Labs reviewed: Recent Labs    05/22/20 1408 05/23/20 0225 05/24/20 0444 05/25/20 0248 05/27/20 1440 07/12/20 0000 07/14/20 0000  NA 130*  --  132* 131* 133* 135* 134*  K 3.6  --  3.6 3.4* 3.5 6.1* 4.5  CL 95*  --  90* 91* 88* 99 97*  CO2 23  --  25 26 31 18 18   GLUCOSE 105*  --  107* 117* 148*  --   --   BUN 17  --  14 16 19  67* 79*  CREATININE 0.77  --  0.82 0.82 0.89 1.4* 1.7*  CALCIUM 8.1*  --  9.0 8.9 9.2 9.0 8.4*  MG 1.7 1.7  --   --   --   --   --   PHOS  --  3.3  --   --   --   --   --    Recent Labs    05/23/20 0404 05/24/20 0444 05/25/20 0248 07/14/20 0000  AST 32 33 33 72*  ALT 22 22 24  51*  ALKPHOS 72 79 80 171*  BILITOT 2.1* 2.2* 2.4*  --   PROT 6.5 6.7 6.3*  --   ALBUMIN 3.5 3.5 3.4* 3.3*   Recent Labs    05/24/20 0444 05/25/20 0248 05/27/20 1440  07/12/20 0000  WBC 6.8 7.6 7.9 9.0  NEUTROABS 4.9 5.6  --   --   HGB 12.5 12.2 13.9 12.0  HCT 37.2 36.5 42.1 34*  MCV 104.2* 104.6* 110.5*  --   PLT 76* 75* 81* 7*   Lab Results  Component Value Date   TSH 3.040 01/28/2018   Lab Results  Component Value Date   HGBA1C  12/14/2008    5.2 (NOTE)  The ADA recommends the following therapeutic goal for glycemic control related to Hgb A1c measurement: Goal of therapy: <6.5 Hgb A1c  Reference: American Diabetes Association: Clinical Practice Recommendations 2010, Diabetes Care, 2010, 33: (Suppl  1).    Assessment/Plan   1. Thrombocytopenia ([place]) Lab Results  Component Value Date   PLT 7 (A) 07/12/2020   -  Plan is for comfort care , will continue PRN Morphine for pain and SOB  2. Anxiety -  Will continue  PRN Xanax    Family/ staff Communication:  Discussed plan of care with charge nurse.  Labs/tests ordered:  None  Goals of care:  [place], DNP, MSN, FNP-BC [facility] and Adult Medicine [phone] (Monday-Friday 8:00 a.m. - 5:00 p.m.) [phone] (after hours)

## 2020-07-28 ENCOUNTER — Encounter: Payer: Self-pay | Admitting: Internal Medicine

## 2020-08-09 DEATH — deceased

## 2020-08-29 ENCOUNTER — Encounter: Payer: Self-pay | Admitting: Internal Medicine

## 2020-09-02 ENCOUNTER — Ambulatory Visit: Payer: Medicare Other | Admitting: Neurology

## 2020-10-03 ENCOUNTER — Ambulatory Visit (INDEPENDENT_AMBULATORY_CARE_PROVIDER_SITE_OTHER): Payer: Medicare Other | Admitting: Otolaryngology

## 2020-12-27 ENCOUNTER — Ambulatory Visit: Payer: Medicare Other | Admitting: Neurology

## 2021-03-14 IMAGING — CT CT CERVICAL SPINE W/O CM
3 series · 13 of 33 positions shown, 16 images · non-contrast
Comparison: None.

CLINICAL DATA: Fall 3 days ago

EXAM:
CT HEAD WITHOUT CONTRAST
TECHNIQUE: Contiguous axial images were obtained from the base of the skull
through the vertex without intravenous contrast.

[Series 4: c spine soft · axial · 0.29mm/px · z∈[+1042,+1164]mm · 5 of 89 slices shown, 7 images]
[im 14/89  soft-tissue]
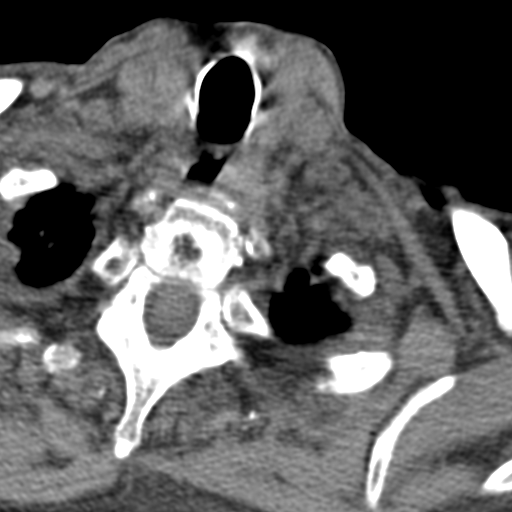
[im 14/89  bone]
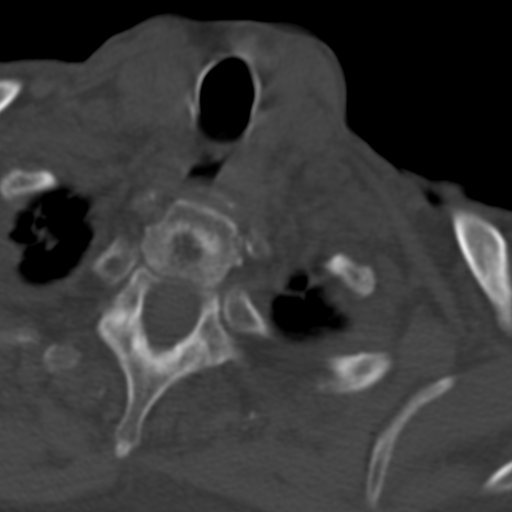
[im 28/89  bone]
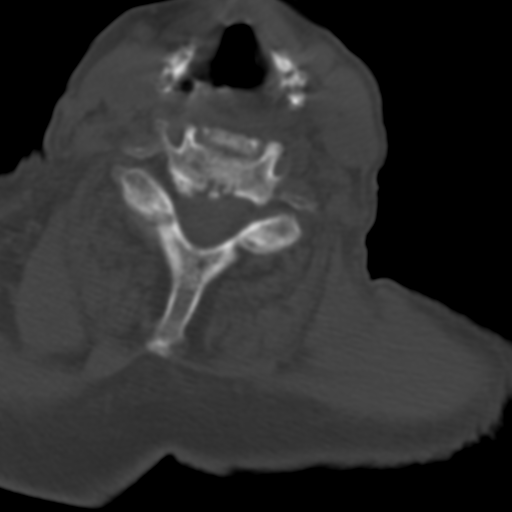
[im 48/89  bone]
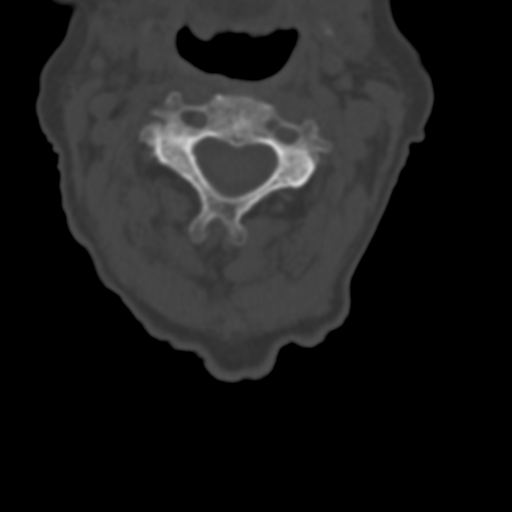
[im 61/89  bone]
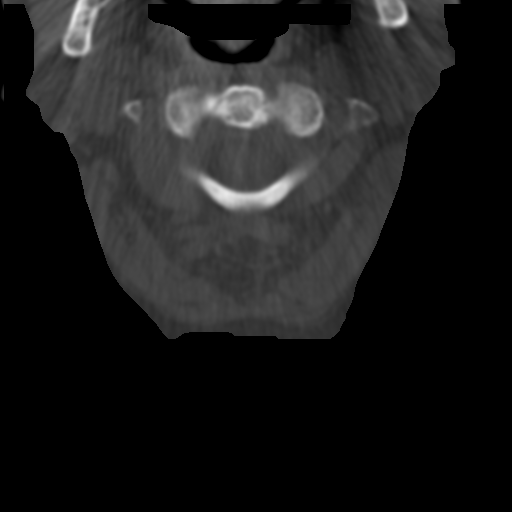
[im 75/89  soft-tissue]
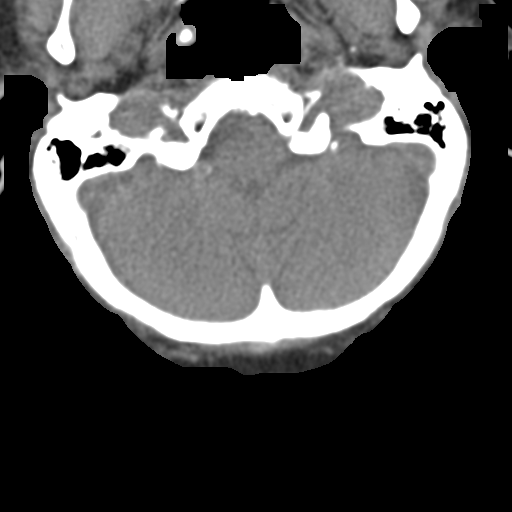
[im 75/89  bone]
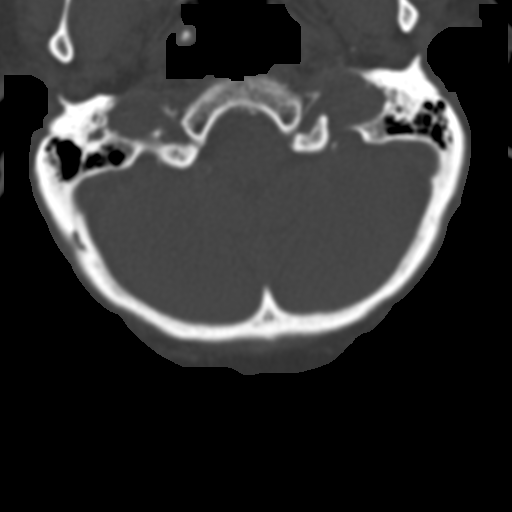

[Series 7: coronal bone · coronal · 0.26mm/px · 3 of 70 slices shown]
[im 14/70  bone]
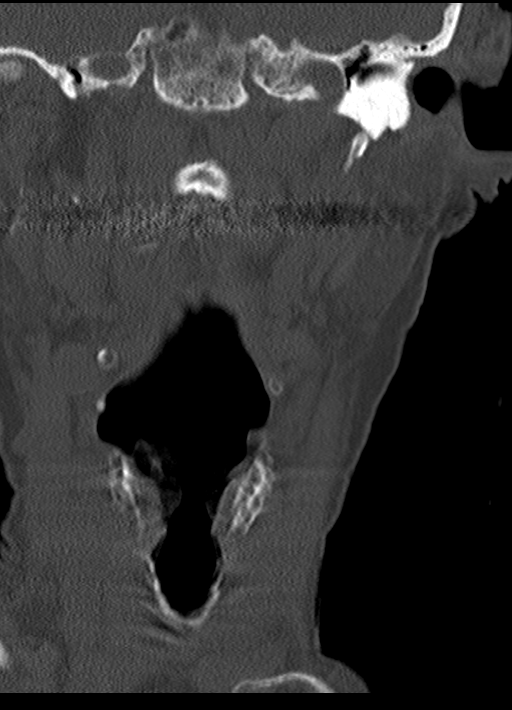
[im 28/70  bone]
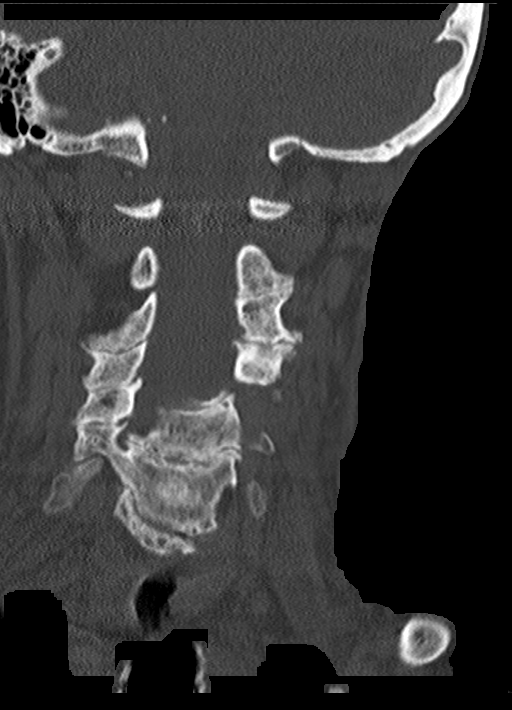
[im 42/70  bone]
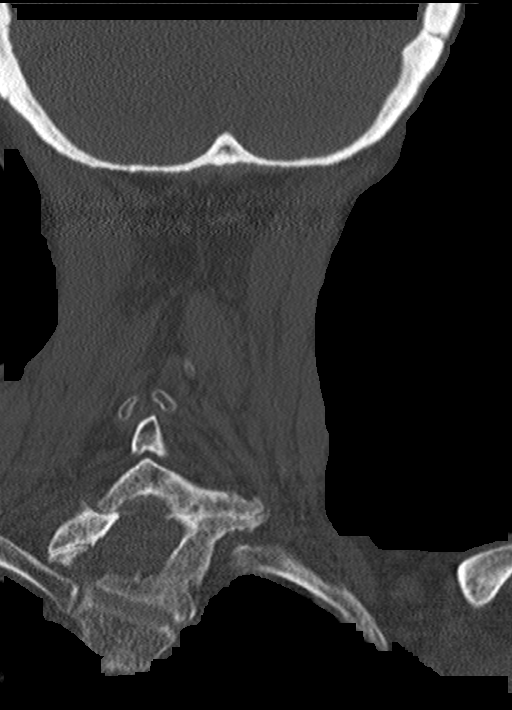

[Series 8: sagittal bone · sagittal · 0.26mm/px · 5 of 53 slices shown, 6 images]
[im 18/53  bone]
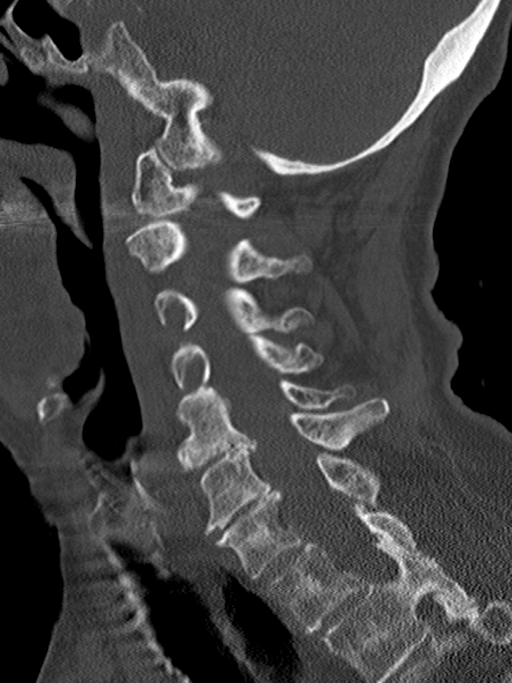
[im 22/53  bone]
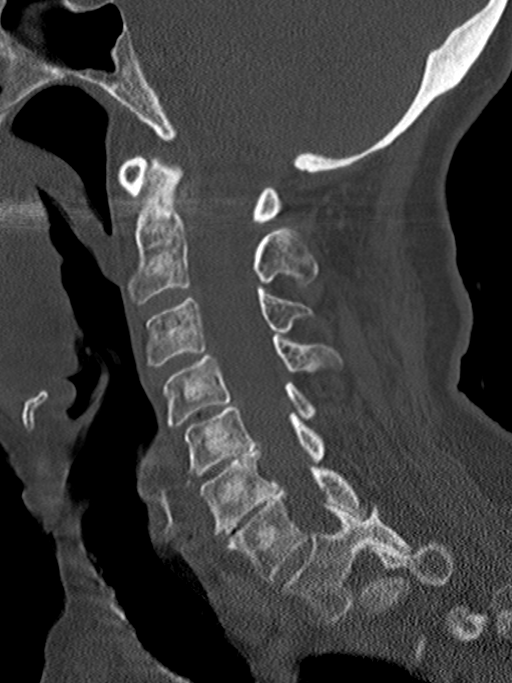
[im 27/53  soft-tissue]
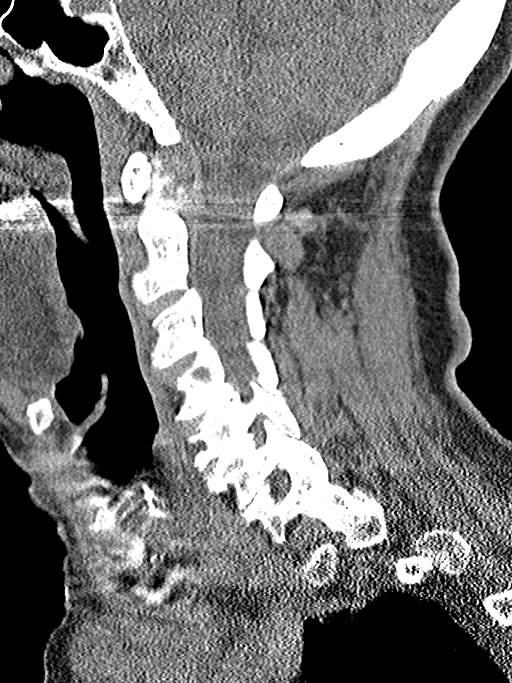
[im 27/53  bone]
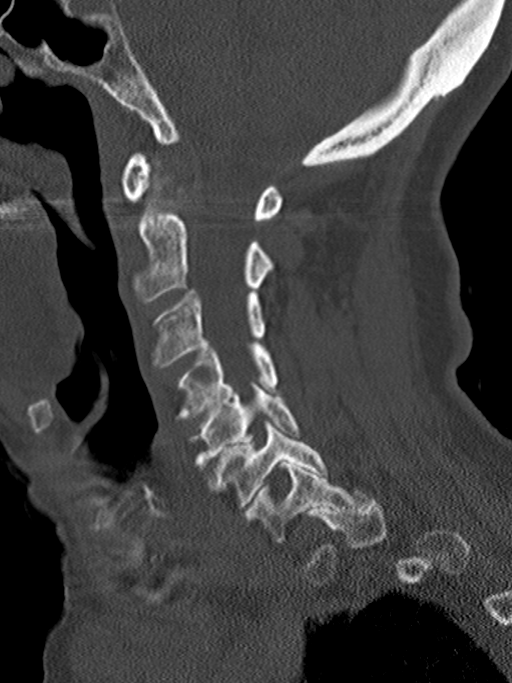
[im 31/53  bone]
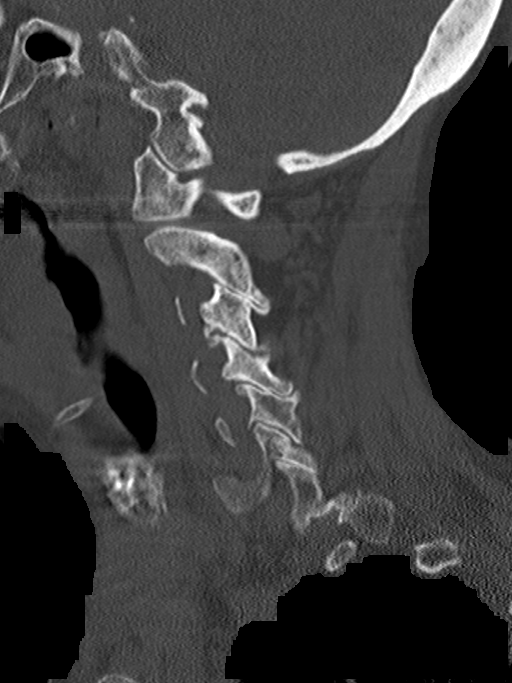
[im 35/53  bone]
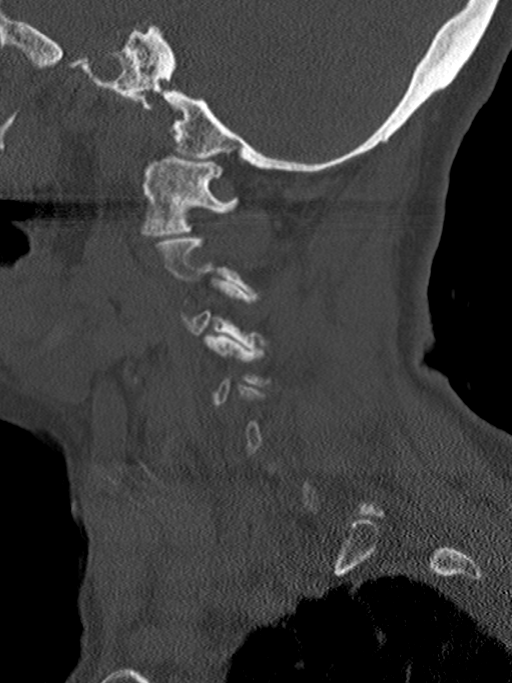

[13 of 33 positions shown; findings below may reference images not displayed]

FINDINGS: Brain: No evidence of acute territorial infarction, hemorrhage,
hydrocephalus,extra-axial collection or mass lesion/mass effect.
There is dilatation the ventricles and sulci consistent with
age-related atrophy. Low-attenuation changes in the deep white
matter consistent with small vessel ischemia. There appears to be a
probable prior lacunar infarct involving the left insular cortex.

Vascular: No hyperdense vessel or unexpected calcification.

Skull: The skull is intact. No fracture or focal lesion identified.

Sinuses/Orbits: The visualized paranasal sinuses and mastoid air
cells are clear. The orbits and globes intact.

Other: None

Cervical spine:

Alignment: There is a mild leftward curvature of the cervical spine.

Skull base and vertebrae: Visualized skull base is intact. No
atlanto-occipital dissociation. The vertebral body heights are well
maintained. No fracture or pathologic osseous lesion seen.

Soft tissues and spinal canal: The visualized paraspinal soft
tissues are unremarkable. No prevertebral soft tissue swelling is
seen. The spinal canal is grossly unremarkable, no large epidural
collection or significant canal narrowing.

Disc levels: Cervical spine spondylosis is noted with disc
osteophyte complex and uncovertebral osteophytes most notable at
C5-C6 and C6-C7 with severe neural foraminal narrowing and mild
central canal stenosis.

Upper chest: Biapical scarring is seen. Thoracic inlet is within
normal limits.

Other: None
IMPRESSION: No acute intracranial abnormality.

Findings consistent with age related atrophy and chronic small
vessel ischemia

Probable prior lacunar infarct within the left insular cortex.

No acute fracture or malalignment of the spine.
